# Patient Record
Sex: Male | Born: 1965
Health system: Southern US, Community
[De-identification: ages and names within clinical notes are randomized; demographics above are authoritative.]

## PROBLEM LIST (undated history)

## (undated) DIAGNOSIS — E669 Obesity, unspecified: Secondary | ICD-10-CM

## (undated) DIAGNOSIS — I10 Essential (primary) hypertension: Secondary | ICD-10-CM

## (undated) DIAGNOSIS — G4730 Sleep apnea, unspecified: Secondary | ICD-10-CM

## (undated) DIAGNOSIS — G4733 Obstructive sleep apnea (adult) (pediatric): Secondary | ICD-10-CM

## (undated) DIAGNOSIS — E118 Type 2 diabetes mellitus with unspecified complications: Secondary | ICD-10-CM

## (undated) DIAGNOSIS — F32A Depression, unspecified: Secondary | ICD-10-CM

## (undated) DIAGNOSIS — Z9989 Dependence on other enabling machines and devices: Secondary | ICD-10-CM

## (undated) HISTORY — DX: Obstructive sleep apnea (adult) (pediatric): G47.33

## (undated) HISTORY — DX: Type 2 diabetes mellitus with unspecified complications: E11.8

## (undated) HISTORY — PX: WISDOM TOOTH EXTRACTION: SHX21

## (undated) HISTORY — DX: Essential (primary) hypertension: I10

## (undated) HISTORY — DX: Obesity, unspecified: E66.9

## (undated) HISTORY — DX: Sleep apnea, unspecified: G47.30

## (undated) HISTORY — DX: Depression, unspecified: F32.A

## (undated) HISTORY — DX: Dependence on other enabling machines and devices: Z99.89

---

## 2012-09-15 ENCOUNTER — Ambulatory Visit (INDEPENDENT_AMBULATORY_CARE_PROVIDER_SITE_OTHER): Payer: BC Managed Care – PPO | Admitting: Internal Medicine

## 2012-09-15 ENCOUNTER — Other Ambulatory Visit (INDEPENDENT_AMBULATORY_CARE_PROVIDER_SITE_OTHER): Payer: PRIVATE HEALTH INSURANCE

## 2012-09-15 ENCOUNTER — Encounter: Payer: Self-pay | Admitting: Internal Medicine

## 2012-09-15 VITALS — BP 126/70 | HR 91 | Temp 98.3°F | Resp 16 | Ht 72.0 in | Wt 362.0 lb

## 2012-09-15 DIAGNOSIS — I10 Essential (primary) hypertension: Secondary | ICD-10-CM

## 2012-09-15 DIAGNOSIS — Z23 Encounter for immunization: Secondary | ICD-10-CM

## 2012-09-15 DIAGNOSIS — R7309 Other abnormal glucose: Secondary | ICD-10-CM

## 2012-09-15 DIAGNOSIS — Z800 Family history of malignant neoplasm of digestive organs: Secondary | ICD-10-CM | POA: Insufficient documentation

## 2012-09-15 HISTORY — DX: Morbid (severe) obesity due to excess calories: E66.01

## 2012-09-15 LAB — CBC WITH DIFFERENTIAL/PLATELET
Basophils Absolute: 0.1 10*3/uL (ref 0.0–0.1)
Basophils Relative: 1.3 % (ref 0.0–3.0)
Eosinophils Absolute: 0.2 10*3/uL (ref 0.0–0.7)
Eosinophils Relative: 2.8 % (ref 0.0–5.0)
HCT: 43.8 % (ref 39.0–52.0)
Hemoglobin: 15 g/dL (ref 13.0–17.0)
Lymphocytes Relative: 15 % (ref 12.0–46.0)
Lymphs Abs: 1 10*3/uL (ref 0.7–4.0)
MCHC: 34.2 g/dL (ref 30.0–36.0)
MCV: 94.3 fl (ref 78.0–100.0)
Monocytes Absolute: 0.7 10*3/uL (ref 0.1–1.0)
Monocytes Relative: 10.3 % (ref 3.0–12.0)
Neutro Abs: 4.5 10*3/uL (ref 1.4–7.7)
Neutrophils Relative %: 70.6 % (ref 43.0–77.0)
Platelets: 192 10*3/uL (ref 150.0–400.0)
RBC: 4.65 Mil/uL (ref 4.22–5.81)
RDW: 12.9 % (ref 11.5–14.6)
WBC: 6.3 10*3/uL (ref 4.5–10.5)

## 2012-09-15 LAB — URINALYSIS, ROUTINE W REFLEX MICROSCOPIC
Bilirubin Urine: NEGATIVE
Hgb urine dipstick: NEGATIVE
Ketones, ur: NEGATIVE
Leukocytes, UA: NEGATIVE
Nitrite: NEGATIVE
Specific Gravity, Urine: 1.015 (ref 1.000–1.030)
Total Protein, Urine: NEGATIVE
Urine Glucose: NEGATIVE
Urobilinogen, UA: 2 (ref 0.0–1.0)
pH: 8 (ref 5.0–8.0)

## 2012-09-15 LAB — COMPREHENSIVE METABOLIC PANEL
ALT: 35 U/L (ref 0–53)
AST: 32 U/L (ref 0–37)
Albumin: 4 g/dL (ref 3.5–5.2)
Alkaline Phosphatase: 75 U/L (ref 39–117)
BUN: 14 mg/dL (ref 6–23)
CO2: 33 mEq/L — ABNORMAL HIGH (ref 19–32)
Calcium: 9.6 mg/dL (ref 8.4–10.5)
Chloride: 98 mEq/L (ref 96–112)
Creatinine, Ser: 0.8 mg/dL (ref 0.4–1.5)
GFR: 115.96 mL/min (ref >60.00)
Glucose, Bld: 154 mg/dL — ABNORMAL HIGH (ref 70–99)
Potassium: 4.2 mEq/L (ref 3.5–5.1)
Sodium: 138 mEq/L (ref 135–145)
Total Bilirubin: 0.9 mg/dL (ref 0.3–1.2)
Total Protein: 7.5 g/dL (ref 6.0–8.3)

## 2012-09-15 LAB — LIPID PANEL
Cholesterol: 172 mg/dL (ref 0–200)
HDL: 52.9 mg/dL (ref >39.00)
LDL Cholesterol: 102 mg/dL — ABNORMAL HIGH (ref 0–99)
Total CHOL/HDL Ratio: 3
Triglycerides: 86 mg/dL (ref 0.0–149.0)
VLDL: 17.2 mg/dL (ref 0.0–40.0)

## 2012-09-15 LAB — HEMOGLOBIN A1C: Hgb A1c MFr Bld: 6 % (ref 4.6–6.5)

## 2012-09-15 LAB — TSH: TSH: 4.75 u[IU]/mL (ref 0.35–5.50)

## 2012-09-15 MED ORDER — LISINOPRIL 10 MG PO TABS: 10.0000 mg | ORAL_TABLET | Freq: Every day | ORAL | Status: DC

## 2012-09-15 MED ORDER — TRIAMTERENE-HCTZ 37.5-25 MG PO TABS: 1.0000 | ORAL_TABLET | Freq: Every day | ORAL | Status: DC

## 2012-09-15 NOTE — Assessment & Plan Note (Signed)
I will check his A1C to see if he has developed DM II 

## 2012-09-15 NOTE — Patient Instructions (Signed)

## 2012-09-15 NOTE — Progress Notes (Signed)
  Subjective:    Patient ID: Zachary Murray, male    DOB: 03/12/1966, 46 y.o.   MRN: [id number]  Hypertension This is a chronic problem. The current episode started more than 1 year ago. The problem has been gradually improving since onset. The problem is controlled. Pertinent negatives include no anxiety, blurred vision, chest pain, headaches, malaise/fatigue, neck pain, orthopnea, palpitations, peripheral edema, PND, shortness of breath or sweats. There are no associated agents to hypertension. Past treatments include ACE inhibitors and diuretics. The current treatment provides significant improvement. There are no compliance problems.  Identifiable causes of hypertension include sleep apnea.      Review of Systems  Constitutional: Negative for fever, chills, malaise/fatigue, diaphoresis, activity change, appetite change, fatigue and unexpected weight change.  HENT: Negative.  Negative for neck pain.   Eyes: Negative.  Negative for blurred vision.  Respiratory: Positive for apnea. Negative for cough, choking, chest tightness, shortness of breath, wheezing and stridor.   Cardiovascular: Negative for chest pain, palpitations, orthopnea, leg swelling and PND.  Gastrointestinal: Negative for nausea, vomiting, abdominal pain, diarrhea, constipation, blood in stool and abdominal distention.  Genitourinary: Negative.   Musculoskeletal: Negative.   Skin: Negative.  Negative for color change, pallor, rash and wound.  Neurological: Negative.  Negative for headaches.  Hematological: Negative for adenopathy. Does not bruise/bleed easily.  Psychiatric/Behavioral: Negative.        Objective:   Physical Exam  Vitals reviewed. Constitutional: He is oriented to person, place, and time. He appears well-developed and well-nourished. No distress.  HENT:  Head: Normocephalic and atraumatic.  Mouth/Throat: Oropharynx is clear and moist. No oropharyngeal exudate.  Eyes: Conjunctivae normal are normal. Right  eye exhibits no discharge. Left eye exhibits no discharge. No scleral icterus.  Neck: Normal range of motion. Neck supple. No JVD present. No tracheal deviation present. No thyromegaly present.  Cardiovascular: Normal rate, regular rhythm, normal heart sounds and intact distal pulses.  Exam reveals no gallop and no friction rub.   No murmur heard. Pulmonary/Chest: Effort normal and breath sounds normal. No stridor. No respiratory distress. He has no wheezes. He has no rales. He exhibits no tenderness.  Abdominal: Soft. Bowel sounds are normal. He exhibits no distension and no mass. There is no tenderness. There is no rebound and no guarding.  Musculoskeletal: Normal range of motion. He exhibits no edema and no tenderness.  Lymphadenopathy:    He has no cervical adenopathy.  Neurological: He is oriented to person, place, and time.  Skin: Skin is warm and dry. No rash noted. He is not diaphoretic. No erythema. No pallor.  Psychiatric: He has a normal mood and affect. His behavior is normal. Judgment and thought content normal.     No results found for this basename: WBC, HGB, HCT, PLT, GLUCOSE, CHOL, TRIG, HDL, LDLDIRECT, LDLCALC, ALT, AST, NA, K, CL, CREATININE, BUN, CO2, TSH, PSA, INR, GLUF, HGBA1C, MICROALBUR       Assessment & Plan:

## 2012-09-15 NOTE — Assessment & Plan Note (Signed)
His BP is well controlled, I will check his lytes and renal function 

## 2012-09-15 NOTE — Assessment & Plan Note (Signed)
Gi referral - he needs a screening colonoscopy

## 2013-01-02 ENCOUNTER — Other Ambulatory Visit: Payer: Self-pay

## 2013-04-14 ENCOUNTER — Other Ambulatory Visit: Payer: Self-pay | Admitting: Internal Medicine

## 2013-08-12 ENCOUNTER — Emergency Department (HOSPITAL_COMMUNITY)
Admission: EM | Admit: 2013-08-12 | Discharge: 2013-08-13 | Disposition: A | Discharge status: Home / Self Care | Payer: BC Managed Care – PPO | Attending: Emergency Medicine | Admitting: Emergency Medicine

## 2013-08-12 ENCOUNTER — Encounter (HOSPITAL_COMMUNITY): Payer: Self-pay | Admitting: Nurse Practitioner

## 2013-08-12 DIAGNOSIS — F1010 Alcohol abuse, uncomplicated: Secondary | ICD-10-CM | POA: Insufficient documentation

## 2013-08-12 DIAGNOSIS — F3289 Other specified depressive episodes: Secondary | ICD-10-CM | POA: Insufficient documentation

## 2013-08-12 DIAGNOSIS — F411 Generalized anxiety disorder: Secondary | ICD-10-CM | POA: Insufficient documentation

## 2013-08-12 DIAGNOSIS — F329 Major depressive disorder, single episode, unspecified: Secondary | ICD-10-CM | POA: Insufficient documentation

## 2013-08-12 DIAGNOSIS — I10 Essential (primary) hypertension: Secondary | ICD-10-CM | POA: Insufficient documentation

## 2013-08-12 DIAGNOSIS — Z79899 Other long term (current) drug therapy: Secondary | ICD-10-CM | POA: Insufficient documentation

## 2013-08-12 DIAGNOSIS — G4733 Obstructive sleep apnea (adult) (pediatric): Secondary | ICD-10-CM | POA: Insufficient documentation

## 2013-08-12 LAB — CBC
HCT: 40.5 % (ref 39.0–52.0)
Hemoglobin: 14.2 g/dL (ref 13.0–17.0)
MCH: 31.6 pg (ref 26.0–34.0)
MCHC: 35.1 g/dL (ref 30.0–36.0)
MCV: 90.2 fL (ref 78.0–100.0)
Platelets: 208 10*3/uL (ref 150–400)
RBC: 4.49 MIL/uL (ref 4.22–5.81)
RDW: 12.9 % (ref 11.5–15.5)
WBC: 5.5 10*3/uL (ref 4.0–10.5)

## 2013-08-12 LAB — COMPREHENSIVE METABOLIC PANEL
ALT: 24 U/L (ref 0–53)
AST: 27 U/L (ref 0–37)
Albumin: 4.1 g/dL (ref 3.5–5.2)
Alkaline Phosphatase: 80 U/L (ref 39–117)
BUN: 11 mg/dL (ref 6–23)
CO2: 26 mEq/L (ref 19–32)
Calcium: 8.9 mg/dL (ref 8.4–10.5)
Chloride: 91 mEq/L — ABNORMAL LOW (ref 96–112)
Creatinine, Ser: 0.61 mg/dL (ref 0.50–1.35)
GFR calc Af Amer: >90 mL/min (ref >90)
GFR calc non Af Amer: >90 mL/min (ref >90)
Glucose, Bld: 151 mg/dL — ABNORMAL HIGH (ref 70–99)
Potassium: 3.3 mEq/L — ABNORMAL LOW (ref 3.5–5.1)
Sodium: 135 mEq/L (ref 135–145)
Total Bilirubin: 0.5 mg/dL (ref 0.3–1.2)
Total Protein: 7.6 g/dL (ref 6.0–8.3)

## 2013-08-12 LAB — SALICYLATE LEVEL: Salicylate Lvl: <2 mg/dL — ABNORMAL LOW (ref 2.8–20.0)

## 2013-08-12 LAB — RAPID URINE DRUG SCREEN, HOSP PERFORMED
Amphetamines: NOT DETECTED
Barbiturates: NOT DETECTED
Benzodiazepines: NOT DETECTED
Cocaine: NOT DETECTED
Opiates: NOT DETECTED
Tetrahydrocannabinol: NOT DETECTED

## 2013-08-12 LAB — ACETAMINOPHEN LEVEL: Acetaminophen (Tylenol), Serum: <15 ug/mL (ref 10–30)

## 2013-08-12 LAB — ETHANOL: Alcohol, Ethyl (B): 74 mg/dL — ABNORMAL HIGH (ref 0–11)

## 2013-08-12 MED ORDER — ADULT MULTIVITAMIN W/MINERALS CH
1.0000 | ORAL_TABLET | Freq: Every day | ORAL | Status: DC
  Administered 2013-08-12: 1 via ORAL
  Filled 2013-08-12: qty 1

## 2013-08-12 MED ORDER — LORAZEPAM 1 MG PO TABS
1.0000 mg | ORAL_TABLET | Freq: Once | ORAL | Status: AC
  Administered 2013-08-12: 1 mg via ORAL
  Filled 2013-08-12: qty 1

## 2013-08-12 MED ORDER — ZOLPIDEM TARTRATE 5 MG PO TABS: 5.0000 mg | ORAL_TABLET | Freq: Every evening | ORAL | Status: DC | PRN

## 2013-08-12 MED ORDER — POTASSIUM CHLORIDE CRYS ER 20 MEQ PO TBCR
40.0000 meq | EXTENDED_RELEASE_TABLET | Freq: Once | ORAL | Status: AC
  Administered 2013-08-12: 40 meq via ORAL
  Filled 2013-08-12: qty 2

## 2013-08-12 MED ORDER — THIAMINE HCL 100 MG/ML IJ SOLN: 100.0000 mg | Freq: Every day | INTRAMUSCULAR | Status: DC

## 2013-08-12 MED ORDER — NICOTINE 21 MG/24HR TD PT24
21.0000 mg | MEDICATED_PATCH | Freq: Every day | TRANSDERMAL | Status: DC
  Administered 2013-08-12: 21 mg via TRANSDERMAL
  Filled 2013-08-12: qty 1

## 2013-08-12 MED ORDER — ONDANSETRON HCL 4 MG PO TABS: 4.0000 mg | ORAL_TABLET | Freq: Three times a day (TID) | ORAL | Status: DC | PRN

## 2013-08-12 MED ORDER — VITAMIN B-1 100 MG PO TABS
100.0000 mg | ORAL_TABLET | Freq: Every day | ORAL | Status: DC
  Administered 2013-08-12: 100 mg via ORAL
  Filled 2013-08-12 (×2): qty 1

## 2013-08-12 MED ORDER — FOLIC ACID 1 MG PO TABS
1.0000 mg | ORAL_TABLET | Freq: Every day | ORAL | Status: DC
  Administered 2013-08-12: 1 mg via ORAL
  Filled 2013-08-12: qty 1

## 2013-08-12 MED ORDER — LORAZEPAM 2 MG/ML IJ SOLN: 1.0000 mg | Freq: Four times a day (QID) | INTRAMUSCULAR | Status: DC | PRN

## 2013-08-12 MED ORDER — ALUM & MAG HYDROXIDE-SIMETH 200-200-20 MG/5ML PO SUSP: 30.0000 mL | ORAL | Status: DC | PRN

## 2013-08-12 MED ORDER — LORAZEPAM 1 MG PO TABS
1.0000 mg | ORAL_TABLET | Freq: Four times a day (QID) | ORAL | Status: DC | PRN
  Administered 2013-08-12: 1 mg via ORAL
  Filled 2013-08-12: qty 1

## 2013-08-12 MED ORDER — IBUPROFEN 400 MG PO TABS: 600.0000 mg | ORAL_TABLET | Freq: Three times a day (TID) | ORAL | Status: DC | PRN

## 2013-08-12 NOTE — ED Notes (Signed)
Pt reports he has been a "heavy drinker" all of his life. States his drinking "has gotten out of control and i am ready to quit drinking." pt last drink was 1200 today. Denies other substance abuse. Denies SI or HI. Pt c/o body aches and feeling shaky now. Pt is tearful and shaky in triage.

## 2013-08-12 NOTE — ED Notes (Signed)
Pt gave the ok for nurse to speak to his father Jillyn Hidden) over the phone and provide an update on his condition.

## 2013-08-12 NOTE — ED Notes (Signed)
telepsych computer placed in room

## 2013-08-12 NOTE — BH Assessment (Signed)
Tele Assessment Note   Zachary Murray is a 46 y.o. single white male.  He presents unaccompanied at [facility] due to physical distress related to alcohol withdrawal, stating, "I think the way I felt physically is what triggered me to come in."  Stressors: Pt reports that he has been unemployed since 2007, and that he currently lives with his parents, to whose home he may return after discharge.  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  He and his father have considered starting a business of their own, but have not developed the idea into a plan.  Pt denies any financial problems, any problems with social supports, or any current legal problems, although he does have a history of a DUI about 21 years ago.  Lethality: Suicidality:  Pt denies SI currently or at any time in the past.  He denies any history of suicide attempts, or of self mutilation.  Pt endorses depressed mood with symptoms noted in the "risk to self" assessment below. Homicidality: Pt denies homicidal thoughts or physical aggression.  Pt does acknowledge having access to firearms, kept in a safe in the home to which he has a key.  Pt denies having any legal problems at this time.  Pt is calm and cooperative during assessment. Psychosis: Pt denies hallucinations.  Pt does not appear to be responding to internal stimuli and exhibits no delusional thought.  Pt's reality testing appears to be intact. Substance Abuse: Pt reports heavy use of alcohol, drinking a total of about 27 liters of wine between Friday, 08/06/2013 and 12:00 today, 08/12/2013.  Pt reports intermittent heavy use, but then clarifies that he still drinks on days when he is not binging, usually about 1 liter of wine daily.  His longest period of sobriety was about 6 weeks in 2008.  He denies having problems with any other substance besides chewing tobacco.  Pt denies any history of seizures or DT's in withdrawal, but reports many current withdrawal symptoms as  found in the "Additional Social History" section below, with a CIWA score totaling 18.  Social Supports: Pt identifies his parents, with whom he lives, as his main supports.  It is noteworthy that he identifies both of them as daily, but functional, drinkers.  Treatment History: Pt has never been hospitalized for mental health or substance abuse problems.  He has never been in any kind of outpatient treatment or programming.  He has never participated in [employer], saying, "That's not for me."  He is not interested in any of these options today, and only presents in the ED requesting medication to mitigate his current withdrawal symptoms.   Axis I: Alcohol Dependence 303.90; Mood Disorder NOS 296.90 Axis II: Deferred Axis III:  Past Medical History  Diagnosis Date  . Hypertension   . OSA on CPAP    Axis IV: occupational problems Axis V: GAF = 50  Past Medical History:  Past Medical History  Diagnosis Date  . Hypertension   . OSA on CPAP     History reviewed. No pertinent past surgical history.  Family History:  Family History  Problem Relation Age of Onset  . Colon cancer Father   . Hypertension Father   . Alcohol abuse Neg Hx   . COPD Neg Hx   . Diabetes Neg Hx   . Early death Neg Hx   . Hearing loss Neg Hx   . Heart disease Neg Hx   . Hyperlipidemia Neg Hx   .  Kidney disease Neg Hx   . Stroke Neg Hx     Social History:  reports that he has never smoked. His smokeless tobacco use includes Chew. He reports that he drinks about 6.0 ounces of alcohol per week. He reports that he does not use illicit drugs.  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 Withdrawal Symptoms: Cramps;Tremors;Agitation;Diarrhea;Weakness (Denies history of seizures or DT's) Substance #1 Name of  Substance 1: Alcohol 1 - Age of First Use: 47 y/o 1 - Amount (size/oz): 4 liters of wine 1 - Frequency: daily 1 - Duration: Past 7 days 1 - Last Use / Amount: 12:00 today (08/12/2013)  CIWA: CIWA-Ar BP: 148/87 mmHg Pulse Rate: 98 Nausea and Vomiting: no nausea and no vomiting Tactile Disturbances: none Tremor: five Auditory Disturbances: not present Paroxysmal Sweats: barely perceptible sweating, palms moist Visual Disturbances: not present Anxiety: five Headache, Fullness in Head: very mild Agitation: five Orientation and Clouding of Sensorium: cannot do serial additions or is uncertain about date CIWA-Ar Total: 18 COWS:    Allergies: No Known Allergies  Home Medications:  (Not in a hospital admission)  OB/GYN Status:  No LMP for male patient.  General Assessment Data Location of Assessment: [facility] ED Is this a Tele or Face-to-Face Assessment?: Tele Assessment Is this an Initial Assessment or a Re-assessment for this encounter?: Initial Assessment Living Arrangements: Parent (Both parents) Can pt return to current living arrangement?: Yes Admission Status: Voluntary Is patient capable of signing voluntary admission?: Yes Transfer from: Acute Hospital Referral Source: Other [facility] ED)  Medical Screening Exam [facility] Walk-in ONLY) Medical Exam completed: No Reason for MSE not completed: Other: (Medically cleared at [facility])  [facility] Crisis Care Plan Living Arrangements: Parent (Both parents) Name of Psychiatrist: None Name of Therapist: None  Education Status Is patient currently in school?: No  Risk to self Suicidal Ideation: No Suicidal Intent: No Is patient at risk for suicide?: No Suicidal Plan?: No Access to Means: No What has been your use of drugs/alcohol within the last 12 months?: Frequent use of alcohol Previous Attempts/Gestures: No How many times?: 0 Other Self Harm Risks: Denies any history of SI: "I'm not that selfish." Triggers for Past  Attempts: Other (Comment) (Not applicable) Intentional Self Injurious Behavior: None Family Suicide History: No Recent stressful life event(s): Job Loss;Other (Comment) (Stopped drinking today & feels bad; unemployed since 2007) Persecutory voices/beliefs?: No Depression: Yes Depression Symptoms: Tearfulness;Isolating;Loss of interest in usual pleasures;Feeling worthless/self pity (Hopelessness) Substance abuse history and/or treatment for substance abuse?: Yes (Frequent use of alcohol) Suicide prevention information given to non-admitted patients: Not applicable (Tele-assessment, unable to provide)  Risk to Others Homicidal Ideation: No Thoughts of Harm to Others: No Current Homicidal Intent: No Current Homicidal Plan: No Access to Homicidal Means: No Identified Victim: None History of harm to others?: No Assessment of Violence: None Noted Violent Behavior Description: Calm, cooperative Does patient have access to weapons?: Yes (Comment) (Guns in safe, pt has key.) Criminal Charges Pending?: No Does patient have a court date: No  Psychosis Hallucinations: None noted Delusions: None noted  Mental Status Report Appear/Hygiene: Other (Comment) [facility] gown) Eye Contact: Good Motor Activity: Restlessness (Mildly restless) Speech: Other (Comment) (Unremarkable) Level of Consciousness: Alert Mood: Anxious;Apathetic Affect: Other (Comment) (Constricted) Anxiety Level: Moderate Thought Processes: Coherent;Relevant Judgement: Impaired Orientation: Person;Place;Time;Situation Obsessive Compulsive Thoughts/Behaviors:  None  Cognitive Functioning Concentration: Normal Memory: Recent Intact;Remote Intact IQ: Average Insight: Fair Impulse Control: Good Appetite: Good Weight Loss: 0 Weight Gain: 0 Sleep: No Change Total Hours of Sleep: 8 (Uses CPAP intermittently) Vegetative Symptoms: Staying in bed;Not bathing;Decreased grooming  ADLScreening [facility] Assessment  Services) Patient's cognitive ability adequate to safely complete daily activities?: Yes Patient able to express need for assistance with ADLs?: Yes Independently performs ADLs?: Yes (appropriate for developmental age)  Prior Inpatient Therapy Prior Inpatient Therapy: No Prior Therapy Dates: None  Prior Outpatient Therapy Prior Outpatient Therapy: No Prior Therapy Dates: None Prior Therapy Facilty/Provider(s): Pt also denies any history of 12-Step meetings.  ADL Screening (condition at time of admission) Patient's cognitive ability adequate to safely complete daily activities?: Yes Is the patient deaf or have difficulty hearing?: No Does the patient have difficulty seeing, even when wearing glasses/contacts?: No Does the patient have difficulty concentrating, remembering, or making decisions?: No Patient able to express need for assistance with ADLs?: Yes Does the patient have difficulty dressing or bathing?: No Independently performs ADLs?: Yes (appropriate for developmental age) Does the patient have difficulty walking or climbing stairs?: No Weakness of Legs: None Weakness of Arms/Hands: None  Home Assistive Devices/Equipment Home Assistive Devices/Equipment: CPAP    Abuse/Neglect Assessment (Assessment to be complete while patient is alone) Physical Abuse: Denies Verbal Abuse: Denies Sexual Abuse: Denies Exploitation of patient/patient's resources: Denies Self-Neglect: Denies Values / Beliefs Cultural Requests During Hospitalization: None Spiritual Requests During Hospitalization: None   Advance Directives (For Healthcare) Advance Directive: Patient does not have advance directive (Tele-assessment: unable to provide) Nutrition Screen- [facility] Adult/WL/AP Patient's home diet: Regular  Additional Information 1:1 In Past 12 Months?: No CIRT Risk: No Elopement Risk: No Does patient have medical clearance?: Yes     Disposition:  Disposition Initial Assessment  Completed for this Encounter: Yes Disposition of Patient: Treatment offered and refused (Pt only wants to be discharged with medications.) Type of treatment offered and refused: Other (Comment) (Pt only wants to be discharged with medications.) After reviewing pt with Janann August, NP @ 20:36, it has been determined that pt does not present a danger to himself or others, but that he would benefit from detoxification from alcohol.  However, pt presents at the ED requesting medication to mitigate withdrawal symptoms, does not want admission for detox and is not interested in information about treatment programs.  At 20:37 I spoke to EDP Vanetta Mulders, MD, who concurs with these findings.  Doylene Canning, MA Triage Specialist Raphael Gibney 08/12/2013 9:08 PM

## 2013-08-12 NOTE — ED Provider Notes (Signed)
CSN: [id number]     Arrival date & time 08/12/13  1802 History   First MD Initiated Contact with Patient 08/12/13 1813     Chief Complaint  Patient presents with  . Alcohol Problem   (Consider location/radiation/quality/duration/timing/severity/associated sxs/prior Treatment) HPI Comments: 47 year old morbidly obese male the past medical history of hypertension and sleep apnea presents to the emergency department requesting help for an alcohol problem. Patient states he has been "heavy drinker" for a while, occasionally goes on severe binges. States over the past 3-4 days he has been drinking about 3 L of wine daily. Last alcohol intake was at 12:00 noon today. He has never sought help for his alcohol problem. States he is overwhelmed with life in general and slightly depressed. He lives at home with his parents. Denies suicidal or homicidal ideations. Denies any other drug abuse. States he is otherwise feeling fine and does not have any other complaints at this time.  Patient is a 47 y.o. male presenting with alcohol problem. The history is provided by the patient.  Alcohol Problem Pertinent negatives include no abdominal pain, chest pain, nausea or vomiting.    Past Medical History  Diagnosis Date  . Hypertension   . OSA on CPAP    History reviewed. No pertinent past surgical history. Family History  Problem Relation Age of Onset  . Colon cancer Father   . Hypertension Father   . Alcohol abuse Neg Hx   . COPD Neg Hx   . Diabetes Neg Hx   . Early death Neg Hx   . Hearing loss Neg Hx   . Heart disease Neg Hx   . Hyperlipidemia Neg Hx   . Kidney disease Neg Hx   . Stroke Neg Hx    History  Substance Use Topics  . Smoking status: Never Smoker   . Smokeless tobacco: Current User    Types: Chew  . Alcohol Use: 6.0 oz/week    10 Glasses of wine per week    Review of Systems  Respiratory: Negative for shortness of breath.   Cardiovascular: Negative for chest pain.   Gastrointestinal: Negative for nausea, vomiting and abdominal pain.  Psychiatric/Behavioral: Positive for dysphoric mood. Negative for suicidal ideas. The patient is nervous/anxious.        Positive for alcohol abuse.  All other systems reviewed and are negative.    Allergies  Review of patient's allergies indicates no known allergies.  Home Medications   Current Outpatient Rx  Name  Route  Sig  Dispense  Refill  . lisinopril (PRINIVIL,ZESTRIL) 10 MG tablet   Oral   Take 1 tablet (10 mg total) by mouth daily.   90 tablet   1   . triamterene-hydrochlorothiazide (MAXZIDE-25) 37.5-25 MG per tablet   Oral   Take 1 each (1 tablet total) by mouth daily.   90 tablet   1    BP 171/94  Pulse 114  Temp(Src) 98.4 F (36.9 C) (Oral)  Resp 18  Ht 6' (1.829 m)  Wt 390 lb (176.903 kg)  BMI 52.88 kg/m2  SpO2 93% Physical Exam  Nursing note and vitals reviewed. Constitutional: He is oriented to person, place, and time. He appears well-developed and well-nourished. No distress.  Morbidly obese  HENT:  Head: Normocephalic and atraumatic.  Mouth/Throat: Oropharynx is clear and moist.  Eyes: Conjunctivae and EOM are normal. Pupils are equal, round, and reactive to light.  Neck: Normal range of motion. Neck supple.  Cardiovascular: Normal rate, regular rhythm and normal  heart sounds.   Pulmonary/Chest: Effort normal and breath sounds normal.  Musculoskeletal: Normal range of motion. He exhibits no edema.  Neurological: He is alert and oriented to person, place, and time.  Skin: Skin is warm and dry. He is not diaphoretic.  Psychiatric: His speech is normal and behavior is normal. Thought content normal. His mood appears anxious. He exhibits a depressed mood. He expresses no homicidal and no suicidal ideation.  Tearful.    ED Course  Procedures (including critical care time) Labs Review Labs Reviewed  COMPREHENSIVE METABOLIC PANEL - Abnormal; Notable for the following:     Potassium 3.3 (*)    Chloride 91 (*)    Glucose, Bld 151 (*)    All other components within normal limits  ETHANOL - Abnormal; Notable for the following:    Alcohol, Ethyl (B) 74 (*)    All other components within normal limits  SALICYLATE LEVEL - Abnormal; Notable for the following:    Salicylate Lvl <2.0 (*)    All other components within normal limits  ACETAMINOPHEN LEVEL  CBC  URINE RAPID DRUG SCREEN (HOSP PERFORMED)   Imaging Review No results found.  MDM   1. Alcohol abuse      Patient with depression, alcohol abuse. No SI/HI. Medically cleared for TTS assessment. He is very anxious, tearful.  12:11 AM During TTS assessment, patient states he does not want inpatient help with withdrawal, does not seem harm to himself or others and feel he is safe to go home. He will be discharged with ativan and zofran.  Trevor Mace, PA-C 08/13/13 0013

## 2013-08-12 NOTE — ED Notes (Signed)
CIWA score: 18

## 2013-08-13 MED ORDER — LORAZEPAM 1 MG PO TABS: 1.0000 mg | ORAL_TABLET | Freq: Three times a day (TID) | ORAL | Status: DC | PRN

## 2013-08-13 MED ORDER — ONDANSETRON HCL 4 MG PO TABS: 4.0000 mg | ORAL_TABLET | Freq: Four times a day (QID) | ORAL | Status: DC

## 2013-08-13 NOTE — ED Provider Notes (Signed)
Medical screening examination/treatment/procedure(s) were performed by non-physician practitioner and as supervising physician I was immediately available for consultation/collaboration.   Junius Argyle, MD 08/13/13 [phone]

## 2013-11-09 ENCOUNTER — Ambulatory Visit: Payer: BC Managed Care – PPO

## 2013-11-19 ENCOUNTER — Ambulatory Visit (INDEPENDENT_AMBULATORY_CARE_PROVIDER_SITE_OTHER): Payer: BC Managed Care – PPO | Admitting: Internal Medicine

## 2013-11-19 ENCOUNTER — Other Ambulatory Visit: Payer: Self-pay | Admitting: *Deleted

## 2013-11-19 ENCOUNTER — Encounter: Payer: Self-pay | Admitting: Internal Medicine

## 2013-11-19 ENCOUNTER — Other Ambulatory Visit (INDEPENDENT_AMBULATORY_CARE_PROVIDER_SITE_OTHER): Payer: BC Managed Care – PPO

## 2013-11-19 VITALS — BP 130/84 | HR 80 | Temp 97.9°F | Resp 16 | Wt 362.0 lb

## 2013-11-19 DIAGNOSIS — E785 Hyperlipidemia, unspecified: Secondary | ICD-10-CM

## 2013-11-19 DIAGNOSIS — Z9989 Dependence on other enabling machines and devices: Secondary | ICD-10-CM

## 2013-11-19 DIAGNOSIS — E876 Hypokalemia: Secondary | ICD-10-CM

## 2013-11-19 DIAGNOSIS — E1169 Type 2 diabetes mellitus with other specified complication: Secondary | ICD-10-CM | POA: Insufficient documentation

## 2013-11-19 DIAGNOSIS — Z800 Family history of malignant neoplasm of digestive organs: Secondary | ICD-10-CM

## 2013-11-19 DIAGNOSIS — I10 Essential (primary) hypertension: Secondary | ICD-10-CM

## 2013-11-19 DIAGNOSIS — R7309 Other abnormal glucose: Secondary | ICD-10-CM

## 2013-11-19 DIAGNOSIS — Z23 Encounter for immunization: Secondary | ICD-10-CM

## 2013-11-19 DIAGNOSIS — G4733 Obstructive sleep apnea (adult) (pediatric): Secondary | ICD-10-CM | POA: Insufficient documentation

## 2013-11-19 HISTORY — DX: Type 2 diabetes mellitus with other specified complication: E11.69

## 2013-11-19 LAB — BASIC METABOLIC PANEL
BUN: 18 mg/dL (ref 6–23)
CO2: 31 mEq/L (ref 19–32)
Calcium: 9.9 mg/dL (ref 8.4–10.5)
Chloride: 101 mEq/L (ref 96–112)
Creatinine, Ser: 0.8 mg/dL (ref 0.4–1.5)
GFR: 108.77 mL/min (ref >60.00)
Glucose, Bld: 119 mg/dL — ABNORMAL HIGH (ref 70–99)
Potassium: 4.2 mEq/L (ref 3.5–5.1)
Sodium: 140 mEq/L (ref 135–145)

## 2013-11-19 LAB — LIPID PANEL
Cholesterol: 145 mg/dL (ref 0–200)
HDL: 49.2 mg/dL (ref >39.00)
LDL Cholesterol: 87 mg/dL (ref 0–99)
Total CHOL/HDL Ratio: 3
Triglycerides: 45 mg/dL (ref 0.0–149.0)
VLDL: 9 mg/dL (ref 0.0–40.0)

## 2013-11-19 LAB — TSH: TSH: 3.13 u[IU]/mL (ref 0.35–5.50)

## 2013-11-19 LAB — MAGNESIUM: Magnesium: 1.5 mg/dL (ref 1.5–2.5)

## 2013-11-19 LAB — HEMOGLOBIN A1C: Hgb A1c MFr Bld: 5.9 % (ref 4.6–6.5)

## 2013-11-19 MED ORDER — TRIAMTERENE-HCTZ 37.5-25 MG PO TABS: 1.0000 | ORAL_TABLET | Freq: Every day | ORAL | Status: DC

## 2013-11-19 MED ORDER — LISINOPRIL 10 MG PO TABS: 10.0000 mg | ORAL_TABLET | Freq: Every day | ORAL | Status: DC

## 2013-11-19 NOTE — Assessment & Plan Note (Signed)
He has lost 30 pounds with diet and exercise

## 2013-11-19 NOTE — Assessment & Plan Note (Signed)
Again, I have asked him to see GI for a colonoscopy

## 2013-11-19 NOTE — Progress Notes (Signed)
   Subjective:    Patient ID: Zachary Murray, male    DOB: 1966-01-24, 48 y.o.   MRN: [id number]  Hypertension This is a chronic problem. The current episode started more than 1 year ago. The problem has been gradually improving since onset. The problem is controlled. Associated symptoms include malaise/fatigue. Pertinent negatives include no anxiety, blurred vision, chest pain, headaches, neck pain, orthopnea, palpitations, peripheral edema, PND, shortness of breath or sweats. There are no associated agents to hypertension. There are no known risk factors for coronary artery disease. Past treatments include ACE inhibitors and diuretics. There are no compliance problems.  Identifiable causes of hypertension include sleep apnea.      Review of Systems  Constitutional: Positive for malaise/fatigue and fatigue. Negative for fever, chills, diaphoresis, activity change, appetite change and unexpected weight change.  HENT: Negative.   Eyes: Negative.  Negative for blurred vision.  Respiratory: Positive for apnea. Negative for cough, choking, chest tightness, shortness of breath, wheezing and stridor.   Cardiovascular: Negative.  Negative for chest pain, palpitations, orthopnea, leg swelling and PND.  Gastrointestinal: Negative.  Negative for nausea, vomiting, abdominal pain, diarrhea, constipation and blood in stool.  Endocrine: Negative.   Genitourinary: Negative.   Musculoskeletal: Negative.  Negative for neck pain.  Skin: Negative.   Allergic/Immunologic: Negative.   Neurological: Negative.  Negative for dizziness, syncope, speech difficulty, weakness, light-headedness and headaches.  Hematological: Negative.  Negative for adenopathy. Does not bruise/bleed easily.  Psychiatric/Behavioral: Negative.        Objective:   Physical Exam  Vitals reviewed. Constitutional: He is oriented to person, place, and time. He appears well-developed and well-nourished. No distress.  HENT:  Head:  Normocephalic and atraumatic.  Mouth/Throat: Oropharynx is clear and moist. No oropharyngeal exudate.  Eyes: Conjunctivae are normal. Right eye exhibits no discharge. Left eye exhibits no discharge. No scleral icterus.  Neck: Normal range of motion. Neck supple. No JVD present. No tracheal deviation present. No thyromegaly present.  Cardiovascular: Normal rate, regular rhythm, normal heart sounds and intact distal pulses.  Exam reveals no gallop and no friction rub.   No murmur heard. Pulmonary/Chest: Effort normal and breath sounds normal. No stridor. No respiratory distress. He has no wheezes. He has no rales. He exhibits no tenderness.  Abdominal: Soft. Bowel sounds are normal. He exhibits no distension and no mass. There is no tenderness. There is no rebound and no guarding.  Musculoskeletal: Normal range of motion. He exhibits no edema and no tenderness.  Lymphadenopathy:    He has no cervical adenopathy.  Neurological: He is oriented to person, place, and time.  Skin: Skin is warm and dry. No rash noted. He is not diaphoretic. No erythema. No pallor.  Psychiatric: He has a normal mood and affect. His behavior is normal. Judgment normal.    Lab Results  Component Value Date   WBC 5.5 08/12/2013   HGB 14.2 08/12/2013   HCT 40.5 08/12/2013   PLT 208 08/12/2013   GLUCOSE 151* 08/12/2013   CHOL 172 09/15/2012   TRIG 86.0 09/15/2012   HDL 52.90 09/15/2012   LDLCALC 102* 09/15/2012   ALT 24 08/12/2013   AST 27 08/12/2013   NA 135 08/12/2013   K 3.3* 08/12/2013   CL 91* 08/12/2013   CREATININE 0.61 08/12/2013   BUN 11 08/12/2013   CO2 26 08/12/2013   TSH 4.75 09/15/2012   HGBA1C 6.0 09/15/2012        Assessment & Plan:

## 2013-11-19 NOTE — Assessment & Plan Note (Signed)
FLP today 

## 2013-11-19 NOTE — Progress Notes (Signed)
Pre visit review using our clinic review tool, if applicable. No additional management support is needed unless otherwise documented below in the visit note. 

## 2013-11-19 NOTE — Patient Instructions (Signed)

## 2013-11-19 NOTE — Assessment & Plan Note (Signed)
I will recheck his K+ level and will check his Mg++ level

## 2013-11-19 NOTE — Assessment & Plan Note (Signed)
I will check his A1C to see if he has developed DM2 

## 2013-11-19 NOTE — Assessment & Plan Note (Signed)
He has not had this evaluated in 10 years so I have referred him to sleep medicine

## 2013-11-19 NOTE — Assessment & Plan Note (Signed)
His BP is well controlled 

## 2013-12-13 ENCOUNTER — Telehealth: Payer: Self-pay | Admitting: Internal Medicine

## 2013-12-13 NOTE — Telephone Encounter (Signed)
Pt is aware.  

## 2013-12-13 NOTE — Telephone Encounter (Signed)
Pulmonary does our sleep medicine

## 2013-12-13 NOTE — Telephone Encounter (Signed)
Pt has questions about the pulmonary referral.  He states the sleep apnea is neurological and thinks he should be going to a neurologist.  Should he cancel the pulmonary appt.?

## 2013-12-17 ENCOUNTER — Encounter: Payer: Self-pay | Admitting: Pulmonary Disease

## 2013-12-17 ENCOUNTER — Ambulatory Visit (INDEPENDENT_AMBULATORY_CARE_PROVIDER_SITE_OTHER): Payer: BC Managed Care – PPO | Admitting: Pulmonary Disease

## 2013-12-17 VITALS — BP 112/80 | HR 87 | Temp 98.7°F | Ht 72.0 in | Wt 364.4 lb

## 2013-12-17 DIAGNOSIS — G4733 Obstructive sleep apnea (adult) (pediatric): Secondary | ICD-10-CM

## 2013-12-17 DIAGNOSIS — Z9989 Dependence on other enabling machines and devices: Secondary | ICD-10-CM

## 2013-12-17 NOTE — Assessment & Plan Note (Signed)
The patient has a history of obstructive sleep apnea which has responded well to CPAP to date. His CPAP machine is now very aged, and obviously needs to be replaced. He is also having some increased symptoms at night and during the day. We will arrange for a new CPAP device, and will also take this opportunity to optimize his pressure again. I've also encouraged him to work aggressively on weight loss, and I will need to see him on a yearly basis.

## 2013-12-17 NOTE — Patient Instructions (Signed)
Will get you set up with a homecare company, and arrange for new cpap device on auto setting.  Once download received, can leave on auto or set on fixed pressure.  Work on weight loss followup with me in one year if doing well.

## 2013-12-17 NOTE — Progress Notes (Signed)
Subjective:    Patient ID: Zachary Murray, male    DOB: 03-17-1966, 48 y.o.   MRN: [id number]  HPI The patient is a 48 year old male who I've been asked to see for management of obstructive sleep apnea. He was diagnosed in 2005 at [facility] with sleep apnea, and started on CPAP at a pressure of 15 cm of water.  He has been on his device since that time, and feels that it may not be working properly currently. He uses a nasal CPAP mask, and has been trying to keep up with changes on a consistent basis. He does not use a heated humidifier. The patient has noted some breakthrough events manifesting as a snoring arousal, and feels that he is not rested in the mornings upon arising. He notes definite sleepiness and fatigue every afternoon, as well as sleepiness with reading in the evening. He denies any sleepiness with driving. The patient states that his weight has decreased 40 pounds since his initial diagnosis, and his Epworth score today is 6.   Sleep Questionnaire What time do you typically go to bed?( Between what hours) 10p-12a 10p-12a at 1623 on 12/17/13 by Virl Cagey, CMA How long does it take you to fall asleep? 62mins 56mins at 1623 on 12/17/13 by Virl Cagey, CMA How many times during the night do you wake up? 10 10 at 1623 on 12/17/13 by Virl Cagey, CMA What time do you get out of bed to start your day? 0730 0730 at 1623 on 12/17/13 by Virl Cagey, CMA Do you drive or operate heavy machinery in your occupation? No No at 1623 on 12/17/13 by Virl Cagey, CMA How much has your weight changed (up or down) over the past two years? (In pounds) 40 lb (18.144 kg) 40 lb (18.144 kg) at 1623 on 12/17/13 by Virl Cagey, CMA Have you ever had a sleep study before? Yes Yes at 1623 on 12/17/13 by Virl Cagey, CMA If yes, location of study? [place] at 9678 on 12/17/13 by Virl Cagey, CMA If yes, date of study?  2005 2005 at 1623 on 12/17/13 by Virl Cagey, CMA Do you currently use CPAP? Yes Yes at 1623 on 12/17/13 by Virl Cagey, CMA If so, what pressure? 15 15 at 1623 on 12/17/13 by Virl Cagey, CMA Do you wear oxygen at any time? No    Review of Systems  Constitutional: Negative for fever and unexpected weight change.  HENT: Negative for congestion, dental problem, ear pain, nosebleeds, postnasal drip, rhinorrhea, sinus pressure, sneezing, sore throat and trouble swallowing.   Eyes: Negative for redness and itching.  Respiratory: Negative for cough, chest tightness, shortness of breath and wheezing.   Cardiovascular: Negative for palpitations and leg swelling.  Gastrointestinal: Negative for nausea and vomiting.  Genitourinary: Negative for dysuria.  Musculoskeletal: Negative for joint swelling.  Skin: Negative for rash.  Neurological: Positive for headaches.  Hematological: Does not bruise/bleed easily.  Psychiatric/Behavioral: Negative for dysphoric mood. The patient is not nervous/anxious.        Objective:   Physical Exam Constitutional:  Obese male, no acute distress  HENT:  Nares patent without discharge  Oropharynx without exudate, palate and uvula are moderately elongated.   Eyes:  Perrla, eomi, no scleral icterus  Neck:  No JVD, no TMG  Cardiovascular:  Normal rate, regular rhythm, no rubs or gallops.  No murmurs  Intact distal pulses  Pulmonary :  Normal breath sounds, no stridor or respiratory distress   No rales, rhonchi, or wheezing  Abdominal:  Soft, nondistended, bowel sounds present.  No tenderness noted.   Musculoskeletal:  mild lower extremity edema noted.  Lymph Nodes:  No cervical lymphadenopathy noted  Skin:  No cyanosis noted  Neurologic:  Alert, appropriate, moves all 4 extremities without obvious deficit.         Assessment & Plan:

## 2013-12-20 ENCOUNTER — Telehealth: Payer: Self-pay | Admitting: Pulmonary Disease

## 2013-12-20 NOTE — Telephone Encounter (Signed)
Spoke with pt. He reports this is the only study he has had done-the CPAP titration. Looked and no sleep study received yet. Office is now closed. Will call back in AM.

## 2013-12-20 NOTE — Telephone Encounter (Signed)
Pt stated during visit that he had sleep study done at [facility] with Dr Nadean Corwin. No other information given. Multiple requests for sleep study sent. Spoke with Mindy and made her aware that this is where the patient stated he had his study done. Will send back to triage to make them aware that I have not received anything from this location or anywhere else

## 2013-12-20 NOTE — Telephone Encounter (Signed)
Spoke with Maudie Mercury from [facility]. They are needing pt sleep study faxed to [phone].  Per Ashtyn she still has not received sleep study from Dr. Hedwig Morton.  I called [phone] to get this faxed ASAP to triage. Was advised they do not have the baseline study only the CPAP titration study done 05/2004. They will fax this over to triage. They do not know where the baseline study was done. Ashytn did we request baseline study from another place? Please advise thanks

## 2013-12-21 ENCOUNTER — Encounter: Payer: Self-pay | Admitting: Pulmonary Disease

## 2013-12-21 NOTE — Telephone Encounter (Signed)
Results received and faxed to [facility]. Looks like a sleep study report and titration study was receive. Nothing further needed

## 2014-01-03 ENCOUNTER — Encounter: Payer: Self-pay | Admitting: Internal Medicine

## 2014-04-28 ENCOUNTER — Ambulatory Visit (INDEPENDENT_AMBULATORY_CARE_PROVIDER_SITE_OTHER): Payer: BC Managed Care – PPO | Admitting: Internal Medicine

## 2014-04-28 ENCOUNTER — Encounter: Payer: Self-pay | Admitting: Internal Medicine

## 2014-04-28 VITALS — BP 126/84 | HR 88 | Temp 98.6°F | Resp 16 | Ht 72.0 in | Wt 360.0 lb

## 2014-04-28 DIAGNOSIS — IMO0002 Reserved for concepts with insufficient information to code with codable children: Secondary | ICD-10-CM

## 2014-04-28 DIAGNOSIS — M79604 Pain in right leg: Secondary | ICD-10-CM | POA: Insufficient documentation

## 2014-04-28 DIAGNOSIS — M5416 Radiculopathy, lumbar region: Secondary | ICD-10-CM

## 2014-04-28 DIAGNOSIS — M79605 Pain in left leg: Secondary | ICD-10-CM | POA: Insufficient documentation

## 2014-04-28 MED ORDER — HYDROCODONE-ACETAMINOPHEN 5-325 MG PO TABS: 1.0000 | ORAL_TABLET | Freq: Four times a day (QID) | ORAL | Status: DC | PRN

## 2014-04-28 NOTE — Progress Notes (Signed)
Pre visit review using our clinic review tool, if applicable. No additional management support is needed unless otherwise documented below in the visit note. 

## 2014-04-28 NOTE — Patient Instructions (Signed)
Spinal Stenosis Spinal stenosis is an abnormal narrowing of the canals of your spine (vertebrae). CAUSES  Spinal stenosis is caused by areas of bone pushing into the central canals of your vertebrae. This condition can be present at birth (congenital). It also may be caused by arthritic deterioration of your vertebrae (spinal degeneration).  SYMPTOMS   Pain that is generally worse with activities, particularly standing and walking.  Numbness, tingling, hot or cold sensations, weakness, or weariness in your legs.  Frequent episodes of falling.  A foot-slapping gait that leads to muscle weakness. DIAGNOSIS  Spinal stenosis is diagnosed with the use of magnetic resonance imaging (MRI) or computed tomography (CT). TREATMENT  Initial therapy for spinal stenosis focuses on the management of the pain and other symptoms associated with the condition. These therapies include:  Practicing postural changes to lessen pressure on your nerves.  Exercises to strengthen the core of your body.  Loss of excess body weight.  The use of nonsteroidal anti-inflammatory medications to reduce swelling and inflammation in your nerves. When therapies to manage pain are not successful, surgery to treat spinal stenosis may be recommended. This surgery involves removing excess bone, which puts pressure on your nerve roots. During this surgery (laminectomy), the posterior boney arch (lamina) and excess bone around the facet joints are removed. Document Released: 01/25/2004 Document Revised: 03/01/2013 Document Reviewed: 02/12/2013 [facility] Patient Information 2014 [place].

## 2014-04-28 NOTE — Progress Notes (Signed)
Subjective:    Patient ID: Zachary Murray, male    DOB: Apr 05, 1966, 48 y.o.   MRN: [id number]  Back Pain This is a recurrent problem. The current episode started more than 1 month ago (4 months ago). The problem occurs intermittently. The problem is unchanged. The pain is present in the lumbar spine. The quality of the pain is described as shooting and stabbing. The pain radiates to the right thigh and left thigh. The pain is at a severity of 4/10. The pain is moderate. The pain is worse during the day. Exacerbated by: walking. Associated symptoms include leg pain (in both thighs), numbness (over both calves) and paresthesias. Pertinent negatives include no abdominal pain, bladder incontinence, bowel incontinence, chest pain, dysuria, fever, paresis, pelvic pain, perianal numbness, tingling, weakness or weight loss. Risk factors include obesity and lack of exercise. He has tried NSAIDs for the symptoms. The treatment provided mild relief.      Review of Systems  Constitutional: Negative for fever and weight loss.  Cardiovascular: Negative for chest pain.  Gastrointestinal: Negative for abdominal pain and bowel incontinence.  Genitourinary: Negative for bladder incontinence, dysuria and pelvic pain.  Musculoskeletal: Positive for back pain.  Neurological: Positive for numbness (over both calves) and paresthesias. Negative for tingling and weakness.       Objective:   Physical Exam  Vitals reviewed. Constitutional: He is oriented to person, place, and time. He appears well-developed and well-nourished. No distress.  HENT:  Head: Normocephalic and atraumatic.  Mouth/Throat: Oropharynx is clear and moist. No oropharyngeal exudate.  Eyes: Conjunctivae are normal. Right eye exhibits no discharge. Left eye exhibits no discharge. No scleral icterus.  Neck: Normal range of motion. Neck supple. No JVD present. No tracheal deviation present. No thyromegaly present.  Cardiovascular: Normal rate,  regular rhythm, normal heart sounds and intact distal pulses.  Exam reveals no gallop and no friction rub.   No murmur heard. Pulses:      Carotid pulses are 1+ on the right side, and 1+ on the left side.      Radial pulses are 1+ on the right side, and 1+ on the left side.       Femoral pulses are 1+ on the right side, and 1+ on the left side.      Popliteal pulses are 1+ on the right side, and 1+ on the left side.       Dorsalis pedis pulses are 1+ on the right side, and 1+ on the left side.       Posterior tibial pulses are 1+ on the right side, and 1+ on the left side.  Pulmonary/Chest: Effort normal and breath sounds normal. No stridor. No respiratory distress. He has no wheezes. He has no rales. He exhibits no tenderness.  Abdominal: Soft. Bowel sounds are normal. He exhibits no distension and no mass. There is no tenderness. There is no rebound and no guarding.  Musculoskeletal: Normal range of motion. He exhibits no edema and no tenderness.       Lumbar back: Normal. He exhibits normal range of motion, no tenderness, no bony tenderness, no swelling, no edema, no deformity, no laceration, no pain, no spasm and normal pulse.  Lymphadenopathy:    He has no cervical adenopathy.  Neurological: He is alert and oriented to person, place, and time. He has normal strength. He displays no atrophy and no tremor. No cranial nerve deficit or sensory deficit. He exhibits normal muscle tone. He displays a negative Romberg  sign. He displays no seizure activity. Coordination and gait normal.  Reflex Scores:      Tricep reflexes are 0 on the right side and 0 on the left side.      Bicep reflexes are 0 on the right side and 0 on the left side.      Brachioradialis reflexes are 0 on the right side and 0 on the left side.      Patellar reflexes are 0 on the right side and 0 on the left side.      Achilles reflexes are 0 on the right side and 0 on the left side. Neg SLR in BLE  Skin: Skin is warm and dry.  No rash noted. He is not diaphoretic. No erythema. No pallor.  Psychiatric: He has a normal mood and affect. His behavior is normal. Judgment and thought content normal.     Lab Results  Component Value Date   WBC 5.5 08/12/2013   HGB 14.2 08/12/2013   HCT 40.5 08/12/2013   PLT 208 08/12/2013   GLUCOSE 119* 11/19/2013   CHOL 145 11/19/2013   TRIG 45.0 11/19/2013   HDL 49.20 11/19/2013   LDLCALC 87 11/19/2013   ALT 24 08/12/2013   AST 27 08/12/2013   NA 140 11/19/2013   K 4.2 11/19/2013   CL 101 11/19/2013   CREATININE 0.8 11/19/2013   BUN 18 11/19/2013   CO2 31 11/19/2013   TSH 3.13 11/19/2013   HGBA1C 5.9 11/19/2013       Assessment & Plan:

## 2014-04-29 ENCOUNTER — Telehealth: Payer: Self-pay | Admitting: Internal Medicine

## 2014-04-29 NOTE — Telephone Encounter (Signed)
Relevant patient education assigned to patient using Emmi. ° °

## 2014-04-29 NOTE — Assessment & Plan Note (Signed)
I am concerned that he may have spinal stenosis, DDD, or an HNP so I have ordered an MRI  For now, he will cont taking the nsaids and will also try norco for pain relief

## 2014-05-23 ENCOUNTER — Inpatient Hospital Stay: Admission: RE | Admit: 2014-05-23 | Payer: BC Managed Care – PPO | Source: Ambulatory Visit

## 2014-05-26 ENCOUNTER — Ambulatory Visit: Payer: BC Managed Care – PPO | Admitting: Internal Medicine

## 2014-06-07 ENCOUNTER — Other Ambulatory Visit: Payer: Self-pay | Admitting: Internal Medicine

## 2014-07-09 ENCOUNTER — Inpatient Hospital Stay: Admission: RE | Admit: 2014-07-09 | Payer: BC Managed Care – PPO | Source: Ambulatory Visit

## 2014-07-18 ENCOUNTER — Inpatient Hospital Stay: Admission: RE | Admit: 2014-07-18 | Payer: BC Managed Care – PPO | Source: Ambulatory Visit

## 2014-07-18 ENCOUNTER — Other Ambulatory Visit: Payer: Self-pay | Admitting: Internal Medicine

## 2014-07-23 ENCOUNTER — Ambulatory Visit
Admission: RE | Admit: 2014-07-23 | Discharge: 2014-07-23 | Disposition: A | Discharge status: Home / Self Care | Payer: BC Managed Care – PPO | Source: Ambulatory Visit | Attending: Internal Medicine | Admitting: Internal Medicine

## 2014-07-23 DIAGNOSIS — M5416 Radiculopathy, lumbar region: Secondary | ICD-10-CM

## 2014-07-25 ENCOUNTER — Encounter: Payer: Self-pay | Admitting: Internal Medicine

## 2014-08-01 ENCOUNTER — Other Ambulatory Visit: Payer: Self-pay | Admitting: *Deleted

## 2014-08-01 MED ORDER — LISINOPRIL 10 MG PO TABS: ORAL_TABLET | ORAL | Status: DC

## 2014-08-03 ENCOUNTER — Ambulatory Visit: Payer: BC Managed Care – PPO | Admitting: Internal Medicine

## 2014-08-08 ENCOUNTER — Ambulatory Visit: Payer: BC Managed Care – PPO | Admitting: Internal Medicine

## 2014-11-24 ENCOUNTER — Ambulatory Visit (INDEPENDENT_AMBULATORY_CARE_PROVIDER_SITE_OTHER): Payer: BLUE CROSS/BLUE SHIELD | Admitting: Internal Medicine

## 2014-11-24 ENCOUNTER — Encounter: Payer: Self-pay | Admitting: Internal Medicine

## 2014-11-24 ENCOUNTER — Other Ambulatory Visit (INDEPENDENT_AMBULATORY_CARE_PROVIDER_SITE_OTHER): Payer: BLUE CROSS/BLUE SHIELD

## 2014-11-24 ENCOUNTER — Ambulatory Visit (INDEPENDENT_AMBULATORY_CARE_PROVIDER_SITE_OTHER)
Admission: RE | Admit: 2014-11-24 | Discharge: 2014-11-24 | Disposition: A | Discharge status: Home / Self Care | Payer: BLUE CROSS/BLUE SHIELD | Source: Ambulatory Visit | Attending: Internal Medicine | Admitting: Internal Medicine

## 2014-11-24 VITALS — BP 140/88 | HR 80 | Temp 98.4°F | Resp 16 | Ht 72.0 in | Wt >= 6400 oz

## 2014-11-24 DIAGNOSIS — R0602 Shortness of breath: Secondary | ICD-10-CM

## 2014-11-24 DIAGNOSIS — J45909 Unspecified asthma, uncomplicated: Secondary | ICD-10-CM | POA: Insufficient documentation

## 2014-11-24 DIAGNOSIS — R791 Abnormal coagulation profile: Secondary | ICD-10-CM

## 2014-11-24 DIAGNOSIS — H109 Unspecified conjunctivitis: Secondary | ICD-10-CM | POA: Insufficient documentation

## 2014-11-24 DIAGNOSIS — R059 Cough, unspecified: Secondary | ICD-10-CM | POA: Insufficient documentation

## 2014-11-24 DIAGNOSIS — R05 Cough: Secondary | ICD-10-CM

## 2014-11-24 DIAGNOSIS — M629 Disorder of muscle, unspecified: Secondary | ICD-10-CM

## 2014-11-24 DIAGNOSIS — I10 Essential (primary) hypertension: Secondary | ICD-10-CM

## 2014-11-24 DIAGNOSIS — M6289 Other specified disorders of muscle: Secondary | ICD-10-CM

## 2014-11-24 DIAGNOSIS — R7989 Other specified abnormal findings of blood chemistry: Secondary | ICD-10-CM

## 2014-11-24 LAB — URINALYSIS, ROUTINE W REFLEX MICROSCOPIC
Bilirubin Urine: NEGATIVE
Hgb urine dipstick: NEGATIVE
Ketones, ur: NEGATIVE
Leukocytes, UA: NEGATIVE
Nitrite: NEGATIVE
RBC / HPF: NONE SEEN (ref 0–?)
Specific Gravity, Urine: <=1.005 — AB (ref 1.000–1.030)
Total Protein, Urine: NEGATIVE
Urine Glucose: NEGATIVE
Urobilinogen, UA: 0.2 (ref 0.0–1.0)
pH: 7 (ref 5.0–8.0)

## 2014-11-24 LAB — CBC WITH DIFFERENTIAL/PLATELET
Basophils Absolute: 0 10*3/uL (ref 0.0–0.1)
Basophils Relative: 0.4 % (ref 0.0–3.0)
Eosinophils Absolute: 0.3 10*3/uL (ref 0.0–0.7)
Eosinophils Relative: 3.2 % (ref 0.0–5.0)
HCT: 43.2 % (ref 39.0–52.0)
Hemoglobin: 14.5 g/dL (ref 13.0–17.0)
Lymphocytes Relative: 13 % (ref 12.0–46.0)
Lymphs Abs: 1.2 10*3/uL (ref 0.7–4.0)
MCHC: 33.5 g/dL (ref 30.0–36.0)
MCV: 90.9 fl (ref 78.0–100.0)
Monocytes Absolute: 1.1 10*3/uL — ABNORMAL HIGH (ref 0.1–1.0)
Monocytes Relative: 12.1 % — ABNORMAL HIGH (ref 3.0–12.0)
Neutro Abs: 6.4 10*3/uL (ref 1.4–7.7)
Neutrophils Relative %: 71.3 % (ref 43.0–77.0)
Platelets: 287 10*3/uL (ref 150.0–400.0)
RBC: 4.76 Mil/uL (ref 4.22–5.81)
RDW: 13.7 % (ref 11.5–15.5)
WBC: 8.9 10*3/uL (ref 4.0–10.5)

## 2014-11-24 LAB — COMPREHENSIVE METABOLIC PANEL
ALT: 25 U/L (ref 0–53)
AST: 30 U/L (ref 0–37)
Albumin: 4.1 g/dL (ref 3.5–5.2)
Alkaline Phosphatase: 107 U/L (ref 39–117)
BUN: 16 mg/dL (ref 6–23)
CO2: 32 mEq/L (ref 19–32)
Calcium: 10.1 mg/dL (ref 8.4–10.5)
Chloride: 97 mEq/L (ref 96–112)
Creatinine, Ser: 0.8 mg/dL (ref 0.4–1.5)
GFR: 113.21 mL/min (ref >60.00)
Glucose, Bld: 126 mg/dL — ABNORMAL HIGH (ref 70–99)
Potassium: 4.9 mEq/L (ref 3.5–5.1)
Sodium: 140 mEq/L (ref 135–145)
Total Bilirubin: 0.7 mg/dL (ref 0.2–1.2)
Total Protein: 7.6 g/dL (ref 6.0–8.3)

## 2014-11-24 LAB — TROPONIN I: TNIDX: 0 ug/l (ref 0.00–0.06)

## 2014-11-24 LAB — TSH: TSH: 4.65 u[IU]/mL — ABNORMAL HIGH (ref 0.35–4.50)

## 2014-11-24 LAB — CARDIAC PANEL
CK-MB: 3.9 ng/mL (ref 0.3–4.0)
Relative Index: 0.8 calc (ref 0.0–2.5)
Total CK: 507 U/L — ABNORMAL HIGH (ref 7–232)

## 2014-11-24 LAB — BRAIN NATRIURETIC PEPTIDE: Pro B Natriuretic peptide (BNP): 47 pg/mL (ref 0.0–100.0)

## 2014-11-24 MED ORDER — LISINOPRIL 10 MG PO TABS: ORAL_TABLET | ORAL | Status: DC

## 2014-11-24 MED ORDER — TOBRAMYCIN-DEXAMETHASONE 0.3-0.1 % OP SUSP: 2.0000 [drp] | Freq: Four times a day (QID) | OPHTHALMIC | Status: DC

## 2014-11-24 MED ORDER — HYDROCODONE-HOMATROPINE 5-1.5 MG/5ML PO SYRP: 5.0000 mL | ORAL_SOLUTION | Freq: Three times a day (TID) | ORAL | Status: DC | PRN

## 2014-11-24 MED ORDER — TRIAMTERENE-HCTZ 37.5-25 MG PO TABS: 1.0000 | ORAL_TABLET | Freq: Every day | ORAL | Status: DC

## 2014-11-24 NOTE — Patient Instructions (Signed)
Cough, Adult  A cough is a reflex that helps clear your throat and airways. It can help heal the body or may be a reaction to an irritated airway. A cough may only last 2 or 3 weeks (acute) or may last more than 8 weeks (chronic).  CAUSES Acute cough:  Viral or bacterial infections. Chronic cough:  Infections.  Allergies.  Asthma.  Post-nasal drip.  Smoking.  Heartburn or acid reflux.  Some medicines.  Chronic lung problems (COPD).  Cancer. SYMPTOMS   Cough.  Fever.  Chest pain.  Increased breathing rate.  High-pitched whistling sound when breathing (wheezing).  Colored mucus that you cough up (sputum). TREATMENT   A bacterial cough may be treated with antibiotic medicine.  A viral cough must run its course and will not respond to antibiotics.  Your caregiver may recommend other treatments if you have a chronic cough. HOME CARE INSTRUCTIONS   Only take over-the-counter or prescription medicines for pain, discomfort, or fever as directed by your caregiver. Use cough suppressants only as directed by your caregiver.  Use a cold steam vaporizer or humidifier in your bedroom or home to help loosen secretions.  Sleep in a semi-upright position if your cough is worse at night.  Rest as needed.  Stop smoking if you smoke. SEEK IMMEDIATE MEDICAL CARE IF:   You have pus in your sputum.  Your cough starts to worsen.  You cannot control your cough with suppressants and are losing sleep.  You begin coughing up blood.  You have difficulty breathing.  You develop pain which is getting worse or is uncontrolled with medicine.  You have a fever. MAKE SURE YOU:   Understand these instructions.  Will watch your condition.  Will get help right away if you are not doing well or get worse. Document Released: 05/03/2011 Document Revised: 01/27/2012 Document Reviewed: 05/03/2011 [employer] Patient Information 2015 [employer]. This information is not intended  to replace advice given to you by your health care provider. Make sure you discuss any questions you have with your health care provider.  

## 2014-11-24 NOTE — Progress Notes (Signed)
Subjective:    Patient ID: Zachary Murray, male    DOB: 01-Oct-1966, 49 y.o.   MRN: [id number]  Cough This is a recurrent problem. The current episode started 1 to 4 weeks ago. The problem has been unchanged. The problem occurs every few hours. The cough is non-productive. Associated symptoms include eye redness, nasal congestion and shortness of breath. Pertinent negatives include no chest pain, chills, ear congestion, ear pain, fever, headaches, heartburn, hemoptysis, myalgias, postnasal drip, rash, rhinorrhea, sore throat, sweats, weight loss or wheezing. He has tried OTC cough suppressant for the symptoms. The treatment provided no relief. There is no history of asthma, bronchiectasis, bronchitis, COPD, emphysema, environmental allergies or pneumonia.      Review of Systems  Constitutional: Negative.  Negative for fever, chills, weight loss, diaphoresis, appetite change and fatigue.  HENT: Negative.  Negative for ear pain, postnasal drip, rhinorrhea and sore throat.   Eyes: Positive for redness and itching. Negative for photophobia, pain, discharge and visual disturbance.  Respiratory: Positive for cough and shortness of breath. Negative for apnea, hemoptysis, choking, chest tightness, wheezing and stridor.   Cardiovascular: Positive for leg swelling (he complains of pain and swelling in both legs that has worsened recently). Negative for chest pain and palpitations.  Gastrointestinal: Negative.  Negative for heartburn, nausea, vomiting, abdominal pain, diarrhea, constipation and blood in stool.  Endocrine: Negative.   Genitourinary: Negative.   Musculoskeletal: Positive for arthralgias (he has intermittent pain in his left hamstring). Negative for myalgias, back pain, joint swelling, neck pain and neck stiffness.  Skin: Negative.  Negative for rash.  Allergic/Immunologic: Negative.  Negative for environmental allergies.  Neurological: Negative.  Negative for dizziness, syncope,  light-headedness, numbness and headaches.  Hematological: Negative.  Negative for adenopathy. Does not bruise/bleed easily.  Psychiatric/Behavioral: Negative.        Objective:   Physical Exam  Constitutional: He is oriented to person, place, and time. He appears well-developed and well-nourished.  Non-toxic appearance. He does not have a sickly appearance. He does not appear ill. No distress.  HENT:  Head: Normocephalic and atraumatic.  Mouth/Throat: Oropharynx is clear and moist. No oropharyngeal exudate.  Eyes: EOM are normal. Pupils are equal, round, and reactive to light. Right eye exhibits no chemosis, no discharge, no exudate and no hordeolum. No foreign body present in the right eye. Left eye exhibits no chemosis, no discharge, no exudate and no hordeolum. No foreign body present in the left eye. Right conjunctiva is injected. Right conjunctiva has no hemorrhage. Left conjunctiva is injected. Left conjunctiva has no hemorrhage. No scleral icterus.    Neck: Normal range of motion. Neck supple. No JVD present. No tracheal deviation present. No thyromegaly present.  Cardiovascular: Normal rate, regular rhythm, normal heart sounds and intact distal pulses.  Exam reveals no gallop and no friction rub.   No murmur heard. Pulmonary/Chest: Effort normal and breath sounds normal. No stridor. No respiratory distress. He has no wheezes. He has no rales. He exhibits no tenderness.  Abdominal: Soft. Bowel sounds are normal. He exhibits no distension and no mass. There is no tenderness. There is no rebound and no guarding.  Musculoskeletal: Normal range of motion. He exhibits edema (1+ non-pitting edema in BLE). He exhibits no tenderness.       Left upper leg: Normal. He exhibits no tenderness, no bony tenderness, no swelling, no edema, no deformity and no laceration.  Lymphadenopathy:    He has no cervical adenopathy.  Neurological: He is oriented to  person, place, and time.  Skin: Skin is warm  and dry. No rash noted. He is not diaphoretic. No erythema. No pallor.  Psychiatric: He has a normal mood and affect. His behavior is normal. Judgment and thought content normal.  Vitals reviewed.   Lab Results  Component Value Date   WBC 8.9 11/24/2014   HGB 14.5 11/24/2014   HCT 43.2 11/24/2014   PLT 287.0 11/24/2014   GLUCOSE 126* 11/24/2014   CHOL 145 11/19/2013   TRIG 45.0 11/19/2013   HDL 49.20 11/19/2013   LDLCALC 87 11/19/2013   ALT 25 11/24/2014   AST 30 11/24/2014   NA 140 11/24/2014   K 4.9 11/24/2014   CL 97 11/24/2014   CREATININE 0.8 11/24/2014   BUN 16 11/24/2014   CO2 32 11/24/2014   TSH 4.65* 11/24/2014   HGBA1C 5.9 11/19/2013        Assessment & Plan:

## 2014-11-25 ENCOUNTER — Encounter: Payer: Self-pay | Admitting: Internal Medicine

## 2014-11-25 ENCOUNTER — Ambulatory Visit (HOSPITAL_COMMUNITY)
Admission: RE | Admit: 2014-11-25 | Discharge: 2014-11-25 | Disposition: A | Discharge status: Home / Self Care | Payer: BLUE CROSS/BLUE SHIELD | Source: Ambulatory Visit | Attending: Internal Medicine | Admitting: Internal Medicine

## 2014-11-25 ENCOUNTER — Other Ambulatory Visit: Payer: Self-pay | Admitting: Internal Medicine

## 2014-11-25 ENCOUNTER — Encounter (HOSPITAL_COMMUNITY): Payer: Self-pay

## 2014-11-25 ENCOUNTER — Inpatient Hospital Stay: Admission: RE | Admit: 2014-11-25 | Payer: BLUE CROSS/BLUE SHIELD | Source: Ambulatory Visit

## 2014-11-25 DIAGNOSIS — R0602 Shortness of breath: Secondary | ICD-10-CM | POA: Insufficient documentation

## 2014-11-25 DIAGNOSIS — R791 Abnormal coagulation profile: Secondary | ICD-10-CM | POA: Insufficient documentation

## 2014-11-25 DIAGNOSIS — J189 Pneumonia, unspecified organism: Secondary | ICD-10-CM | POA: Insufficient documentation

## 2014-11-25 DIAGNOSIS — R05 Cough: Secondary | ICD-10-CM | POA: Diagnosis not present

## 2014-11-25 DIAGNOSIS — R7989 Other specified abnormal findings of blood chemistry: Secondary | ICD-10-CM | POA: Insufficient documentation

## 2014-11-25 LAB — D-DIMER, QUANTITATIVE (NOT AT ARMC): D-Dimer, Quant: 1 ug/mL-FEU — ABNORMAL HIGH (ref 0.00–0.48)

## 2014-11-25 MED ORDER — IOHEXOL 350 MG/ML SOLN
80.0000 mL | Freq: Once | INTRAVENOUS | Status: AC | PRN
  Administered 2014-11-25: 80 mL via INTRAVENOUS

## 2014-11-25 MED ORDER — MOXIFLOXACIN HCL 400 MG PO TABS: 400.0000 mg | ORAL_TABLET | Freq: Every day | ORAL | Status: DC

## 2014-11-25 NOTE — Assessment & Plan Note (Signed)
This appears to be related to the Viral URI Will treat with tobradex

## 2014-11-25 NOTE — Assessment & Plan Note (Signed)
EKG is WNL, cardiac enzymes and BNP are WNL CXR is normal D-dimer is slightly elevated - will get a CT angio done to screen for PE This appears to be related to a Viral URI +++ obesity, will follow this for now

## 2014-11-25 NOTE — Assessment & Plan Note (Signed)
He is morbidly obese and is therefore high risk for PE Will get a CT angio done

## 2014-11-25 NOTE — Assessment & Plan Note (Signed)
CXR is WNL This appears to be a Viral URI Will give a cough suppressant

## 2014-11-25 NOTE — Assessment & Plan Note (Signed)
Will refer to sports med for further evaluation

## 2014-11-25 NOTE — Assessment & Plan Note (Signed)
His BP is well controlled Will monitor his lytes and renal function 

## 2014-12-08 ENCOUNTER — Encounter: Payer: Self-pay | Admitting: Internal Medicine

## 2014-12-08 ENCOUNTER — Ambulatory Visit (INDEPENDENT_AMBULATORY_CARE_PROVIDER_SITE_OTHER)
Admission: RE | Admit: 2014-12-08 | Discharge: 2014-12-08 | Disposition: A | Discharge status: Home / Self Care | Payer: BLUE CROSS/BLUE SHIELD | Source: Ambulatory Visit | Attending: Internal Medicine | Admitting: Internal Medicine

## 2014-12-08 ENCOUNTER — Ambulatory Visit (INDEPENDENT_AMBULATORY_CARE_PROVIDER_SITE_OTHER): Payer: BLUE CROSS/BLUE SHIELD | Admitting: Internal Medicine

## 2014-12-08 VITALS — BP 110/70 | HR 101 | Temp 98.5°F | Resp 16 | Ht 72.0 in

## 2014-12-08 DIAGNOSIS — J189 Pneumonia, unspecified organism: Secondary | ICD-10-CM

## 2014-12-08 DIAGNOSIS — E038 Other specified hypothyroidism: Secondary | ICD-10-CM

## 2014-12-08 DIAGNOSIS — J4520 Mild intermittent asthma, uncomplicated: Secondary | ICD-10-CM

## 2014-12-08 DIAGNOSIS — R059 Cough, unspecified: Secondary | ICD-10-CM

## 2014-12-08 DIAGNOSIS — R05 Cough: Secondary | ICD-10-CM

## 2014-12-08 DIAGNOSIS — E039 Hypothyroidism, unspecified: Secondary | ICD-10-CM

## 2014-12-08 HISTORY — DX: Hypothyroidism, unspecified: E03.9

## 2014-12-08 MED ORDER — LEVOTHYROXINE SODIUM 50 MCG PO TABS: 50.0000 ug | ORAL_TABLET | Freq: Every day | ORAL | Status: DC

## 2014-12-08 MED ORDER — FLUTICASONE FUROATE-VILANTEROL 200-25 MCG/INH IN AEPB: 1.0000 | INHALATION_SPRAY | Freq: Every day | RESPIRATORY_TRACT | Status: DC

## 2014-12-08 NOTE — Progress Notes (Signed)
Subjective:    Patient ID: Zachary Murray, male    DOB: 18-Dec-1965, 49 y.o.   MRN: [id number]  Cough This is a recurrent problem. The current episode started 1 to 4 weeks ago. The problem has been gradually improving. The problem occurs every few hours. The cough is non-productive. Associated symptoms include chills, shortness of breath and wheezing. Pertinent negatives include no chest pain, ear congestion, ear pain, fever, headaches, heartburn, hemoptysis, myalgias, nasal congestion, postnasal drip, rash, rhinorrhea, sore throat, sweats or weight loss. He has tried prescription cough suppressant for the symptoms. The treatment provided moderate relief. His past medical history is significant for pneumonia. There is no history of asthma, bronchiectasis, bronchitis, COPD, emphysema or environmental allergies.      Review of Systems  Constitutional: Positive for chills. Negative for fever, weight loss, diaphoresis and fatigue.  HENT: Negative.  Negative for ear pain, postnasal drip, rhinorrhea, sore throat, trouble swallowing and voice change.   Eyes: Negative.   Respiratory: Positive for cough, shortness of breath and wheezing. Negative for hemoptysis, choking, chest tightness and stridor.   Cardiovascular: Negative.  Negative for chest pain, palpitations and leg swelling.  Gastrointestinal: Negative.  Negative for heartburn, nausea, vomiting, abdominal pain, diarrhea, constipation and blood in stool.  Endocrine: Negative.   Genitourinary: Negative.   Musculoskeletal: Negative.  Negative for myalgias.  Skin: Negative.  Negative for rash.  Allergic/Immunologic: Negative.  Negative for environmental allergies.  Neurological: Negative.  Negative for dizziness and headaches.  Hematological: Negative.  Negative for adenopathy. Does not bruise/bleed easily.  Psychiatric/Behavioral: Negative.        Objective:   Physical Exam  Constitutional: He is oriented to person, place, and time. He  appears well-developed and well-nourished.  Non-toxic appearance. He does not have a sickly appearance. He does not appear ill. No distress.  HENT:  Head: Normocephalic and atraumatic.  Mouth/Throat: Oropharynx is clear and moist. No oropharyngeal exudate.  Eyes: Conjunctivae are normal. Right eye exhibits no discharge. Left eye exhibits no discharge. No scleral icterus.  Neck: Normal range of motion. Neck supple. No JVD present. No tracheal deviation present. No thyromegaly present.  Cardiovascular: Normal rate, regular rhythm, normal heart sounds and intact distal pulses.  Exam reveals no gallop and no friction rub.   No murmur heard. Pulmonary/Chest: Effort normal. No accessory muscle usage or stridor. No tachypnea. No respiratory distress. He has no decreased breath sounds. He has wheezes in the right upper field. He has rhonchi in the right upper field. He has no rales. He exhibits no tenderness.  Abdominal: Soft. Bowel sounds are normal. He exhibits no distension and no mass. There is no tenderness. There is no rebound and no guarding.  Musculoskeletal: Normal range of motion. He exhibits no edema or tenderness.  Lymphadenopathy:    He has no cervical adenopathy.  Neurological: He is oriented to person, place, and time.  Skin: Skin is warm and dry. No rash noted. He is not diaphoretic. No erythema. No pallor.  Vitals reviewed.    Lab Results  Component Value Date   WBC 8.9 11/24/2014   HGB 14.5 11/24/2014   HCT 43.2 11/24/2014   PLT 287.0 11/24/2014   GLUCOSE 126* 11/24/2014   CHOL 145 11/19/2013   TRIG 45.0 11/19/2013   HDL 49.20 11/19/2013   LDLCALC 87 11/19/2013   ALT 25 11/24/2014   AST 30 11/24/2014   NA 140 11/24/2014   K 4.9 11/24/2014   CL 97 11/24/2014   CREATININE 0.8  11/24/2014   BUN 16 11/24/2014   CO2 32 11/24/2014   TSH 4.65* 11/24/2014   HGBA1C 5.9 11/19/2013       Assessment & Plan:

## 2014-12-08 NOTE — Progress Notes (Signed)
Pre visit review using our clinic review tool, if applicable. No additional management support is needed unless otherwise documented below in the visit note. 

## 2014-12-08 NOTE — Patient Instructions (Signed)
Cough, Adult  A cough is a reflex that helps clear your throat and airways. It can help heal the body or may be a reaction to an irritated airway. A cough may only last 2 or 3 weeks (acute) or may last more than 8 weeks (chronic).  CAUSES Acute cough:  Viral or bacterial infections. Chronic cough:  Infections.  Allergies.  Asthma.  Post-nasal drip.  Smoking.  Heartburn or acid reflux.  Some medicines.  Chronic lung problems (COPD).  Cancer. SYMPTOMS   Cough.  Fever.  Chest pain.  Increased breathing rate.  High-pitched whistling sound when breathing (wheezing).  Colored mucus that you cough up (sputum). TREATMENT   A bacterial cough may be treated with antibiotic medicine.  A viral cough must run its course and will not respond to antibiotics.  Your caregiver may recommend other treatments if you have a chronic cough. HOME CARE INSTRUCTIONS   Only take over-the-counter or prescription medicines for pain, discomfort, or fever as directed by your caregiver. Use cough suppressants only as directed by your caregiver.  Use a cold steam vaporizer or humidifier in your bedroom or home to help loosen secretions.  Sleep in a semi-upright position if your cough is worse at night.  Rest as needed.  Stop smoking if you smoke. SEEK IMMEDIATE MEDICAL CARE IF:   You have pus in your sputum.  Your cough starts to worsen.  You cannot control your cough with suppressants and are losing sleep.  You begin coughing up blood.  You have difficulty breathing.  You develop pain which is getting worse or is uncontrolled with medicine.  You have a fever. MAKE SURE YOU:   Understand these instructions.  Will watch your condition.  Will get help right away if you are not doing well or get worse. Document Released: 05/03/2011 Document Revised: 01/27/2012 Document Reviewed: 05/03/2011 [employer] Patient Information 2015 [employer]. This information is not intended  to replace advice given to you by your health care provider. Make sure you discuss any questions you have with your health care provider.  

## 2014-12-12 ENCOUNTER — Encounter: Payer: Self-pay | Admitting: Internal Medicine

## 2014-12-12 NOTE — Assessment & Plan Note (Signed)
His cough persists and I am concerned that the ACEI may be a culprit so I have discontinued it

## 2014-12-12 NOTE — Assessment & Plan Note (Signed)
I have asked him to start a LABA/ICS combination for this I gave him samples of breo and showed him how to use it, he demonstrated proficiency with its use.

## 2014-12-12 NOTE — Assessment & Plan Note (Signed)
He appears to be improving His repeat CXR shows atelectasis but no complications

## 2015-02-27 ENCOUNTER — Other Ambulatory Visit: Payer: Self-pay | Admitting: Internal Medicine

## 2015-06-15 ENCOUNTER — Other Ambulatory Visit: Payer: Self-pay | Admitting: Internal Medicine

## 2015-09-29 ENCOUNTER — Other Ambulatory Visit: Payer: Self-pay

## 2015-09-29 MED ORDER — TRIAMTERENE-HCTZ 37.5-25 MG PO TABS: 1.0000 | ORAL_TABLET | Freq: Every day | ORAL | Status: DC

## 2015-10-26 ENCOUNTER — Encounter: Payer: Self-pay | Admitting: Internal Medicine

## 2015-10-26 ENCOUNTER — Ambulatory Visit (INDEPENDENT_AMBULATORY_CARE_PROVIDER_SITE_OTHER): Payer: BLUE CROSS/BLUE SHIELD | Admitting: Internal Medicine

## 2015-10-26 ENCOUNTER — Other Ambulatory Visit: Payer: Self-pay | Admitting: Internal Medicine

## 2015-10-26 ENCOUNTER — Other Ambulatory Visit (INDEPENDENT_AMBULATORY_CARE_PROVIDER_SITE_OTHER): Payer: BLUE CROSS/BLUE SHIELD

## 2015-10-26 VITALS — BP 136/90 | HR 98 | Temp 98.4°F | Resp 20 | Ht 72.0 in | Wt >= 6400 oz

## 2015-10-26 DIAGNOSIS — I10 Essential (primary) hypertension: Secondary | ICD-10-CM

## 2015-10-26 DIAGNOSIS — R197 Diarrhea, unspecified: Secondary | ICD-10-CM | POA: Insufficient documentation

## 2015-10-26 DIAGNOSIS — R946 Abnormal results of thyroid function studies: Secondary | ICD-10-CM | POA: Diagnosis not present

## 2015-10-26 DIAGNOSIS — R739 Hyperglycemia, unspecified: Secondary | ICD-10-CM | POA: Diagnosis not present

## 2015-10-26 DIAGNOSIS — R7989 Other specified abnormal findings of blood chemistry: Secondary | ICD-10-CM

## 2015-10-26 DIAGNOSIS — Z23 Encounter for immunization: Secondary | ICD-10-CM

## 2015-10-26 DIAGNOSIS — G4733 Obstructive sleep apnea (adult) (pediatric): Secondary | ICD-10-CM

## 2015-10-26 DIAGNOSIS — E118 Type 2 diabetes mellitus with unspecified complications: Secondary | ICD-10-CM | POA: Insufficient documentation

## 2015-10-26 LAB — TSH: TSH: 4.3 u[IU]/mL (ref 0.35–4.50)

## 2015-10-26 LAB — BASIC METABOLIC PANEL
BUN: 11 mg/dL (ref 6–23)
CO2: 34 mEq/L — ABNORMAL HIGH (ref 19–32)
Calcium: 9.8 mg/dL (ref 8.4–10.5)
Chloride: 96 mEq/L (ref 96–112)
Creatinine, Ser: 0.74 mg/dL (ref 0.40–1.50)
GFR: 118.09 mL/min (ref >60.00)
Glucose, Bld: 162 mg/dL — ABNORMAL HIGH (ref 70–99)
Potassium: 4.1 mEq/L (ref 3.5–5.1)
Sodium: 137 mEq/L (ref 135–145)

## 2015-10-26 LAB — HEMOGLOBIN A1C: Hgb A1c MFr Bld: 8.2 % — ABNORMAL HIGH (ref 4.6–6.5)

## 2015-10-26 LAB — T3, FREE: T3, Free: 3.3 pg/mL (ref 2.3–4.2)

## 2015-10-26 MED ORDER — SAXAGLIPTIN-METFORMIN ER 2.5-1000 MG PO TB24: 2.0000 | ORAL_TABLET | Freq: Every day | ORAL | Status: DC

## 2015-10-26 NOTE — Patient Instructions (Signed)
Chronic Diarrhea Diarrhea is frequent loose and watery bowel movements. It can cause you to feel weak and dehydrated. Dehydration can cause you to become tired and thirsty and to have a dry mouth, decreased urination, and dark yellow urine. Diarrhea is a sign of another problem, most often an infection that will not last long. In most cases, diarrhea lasts 2-3 days. Diarrhea that lasts longer than 4 weeks is called long-lasting (chronic) diarrhea. It is important to treat your diarrhea as directed by your health care provider to lessen or prevent future episodes of diarrhea.  CAUSES  There are many causes of chronic diarrhea. The following are some possible causes:   Gastrointestinal infections caused by viruses, bacteria, or parasites.   Food poisoning or food allergies.   Certain medicines, such as antibiotics, chemotherapy, and laxatives.   Artificial sweeteners and fructose.   Digestive disorders, such as celiac disease and inflammatory bowel diseases.   Irritable bowel syndrome.  Some disorders of the pancreas.  Disorders of the thyroid.  Reduced blood flow to the intestines.  Cancer. Sometimes the cause of chronic diarrhea is unknown. RISK FACTORS  Having a severely weakened immune system, such as from HIV or AIDS.   Taking certain types of cancer-fighting drugs (such as with chemotherapy) or other medicines.   Having had a recent organ transplant.   Having a portion of the stomach or small bowel removed.   Traveling to countries where food and water supplies are often contaminated.  SYMPTOMS  In addition to frequent, loose stools, diarrhea may cause:   Cramping.   Abdominal pain.   Nausea.   Fever.  Fatigue.  Urgent need to use the bathroom.  Loss of bowel control. DIAGNOSIS  Your health care provider must take a careful history and perform a physical exam. Tests given are based on your symptoms and history. Tests may include:   Blood or  stool tests. Three or more stool samples may be examined. Stool cultures may be used to test for bacteria or parasites.   X-rays.   A procedure in which a thin tube is inserted into the mouth or rectum (endoscopy). This allows the health care provider to look inside the intestine.  TREATMENT   Treatment is aimed at correcting the cause of the diarrhea when possible.  Diarrhea caused by an infection can often be treated with antibiotic medicines.  Diarrhea not caused by an infection may require you to take long-term medicine or have surgery. Specific treatment should be discussed with your health care provider.  If the cause cannot be determined, treatment aims to relieve symptoms and prevent dehydration. Serious health problems can occur if you do not maintain proper fluid levels. Treatment may include:  Taking an oral rehydration solution (ORS).  Not drinking beverages that contain caffeine (such as tea, coffee, and soft drinks).  Not drinking alcohol.  Maintaining well-balanced nutrition to help you recover faster. HOME CARE INSTRUCTIONS   Drink enough fluids to keep urine clear or pale yellow. Drink 1 cup (8 oz) of fluid for each diarrhea episode. Avoid fluids that contain simple sugars, fruit juices, whole milk products, and sodas. Hydrate with an ORS. You may purchase the ORS or prepare it at home by mixing the following ingredients together:   - tsp (1.7-3  mL) table salt.   tsp (3  mL) baking soda.   tsp (1.7 mL) salt substitute containing potassium chloride.  1 tbsp (20 mL) sugar.  4.2 c (1 L) of water.     Certain foods and beverages may increase the speed at which food moves through the gastrointestinal (GI) tract. These foods and beverages should be avoided. They include:  Caffeinated and alcoholic beverages.  High-fiber foods, such as raw fruits and vegetables, nuts, seeds, and whole grain breads and cereals.  Foods and beverages sweetened with sugar  alcohols, such as xylitol, sorbitol, and mannitol.   Some foods may be well tolerated and may help thicken stool. These include:  Starchy foods, such as rice, toast, pasta, low-sugar cereal, oatmeal, grits, baked potatoes, crackers, and bagels.  Bananas.  Applesauce.  Add probiotic-rich foods to help increase healthy bacteria in the GI tract. These include yogurt and fermented milk products.  Wash your hands well after each diarrhea episode.  Only take over-the-counter or prescription medicines as directed by your health care provider.  Take a warm bath to relieve any burning or pain from frequent diarrhea episodes. SEEK MEDICAL CARE IF:   You are not urinating as often.  Your urine is a dark color.  You become very tired or dizzy.  You have severe pain in the abdomen or rectum.  Your have blood or pus in your stools.  Your stools look black and tarry. SEEK IMMEDIATE MEDICAL CARE IF:   You are unable to keep fluids down.  You have persistent vomiting.  You have blood in your stool.  Your stools are black and tarry.  You do not urinate in 6-8 hours, or there is only a small amount of very dark urine.  You have abdominal pain that increases or localizes.  You have weakness, dizziness, confusion, or lightheadedness.  You have a severe headache.  Your diarrhea gets worse or does not get better.  You have a fever or persistent symptoms for more than 2-3 days.  You have a fever and your symptoms suddenly get worse. MAKE SURE YOU:   Understand these instructions.  Will watch your condition.  Will get help right away if you are not doing well or get worse.   This information is not intended to replace advice given to you by your health care provider. Make sure you discuss any questions you have with your health care provider.   Document Released: 01/25/2004 Document Revised: 11/09/2013 Document Reviewed: 04/29/2013 Elsevier Interactive Patient Education 2016  [employer]  

## 2015-10-26 NOTE — Progress Notes (Signed)
Pre visit review using our clinic review tool, if applicable. No additional management support is needed unless otherwise documented below in the visit note. 

## 2015-10-26 NOTE — Progress Notes (Signed)
Subjective:  Patient ID: Zachary Murray, male    DOB: 1966-11-09  Age: 49 y.o. MRN: [id number]  CC: Hypothyroidism; Hypertension; and Diarrhea   [name] presents for follow-up. He has not felt well for many months. He complains of weight gain, shortness of breath, sleep apnea, fatigue, insomnia, one loose bowel movement per day, blurred vision, brain fog, and bilateral knee pain. He is concerned that there may be yeast in his stool and it may be invading his body. He is thinking about filing for disability. He has had no wheezing so he stopped using his inhaler. He decided to stop taking his thyroid replacement medication.  Outpatient Prescriptions Prior to Visit  Medication Sig Dispense Refill  . Cholecalciferol (VITAMIN D3) 1000 UNITS CAPS Take 1 capsule by mouth daily.    Marland Kitchen triamterene-hydrochlorothiazide (MAXZIDE-25) 37.5-25 MG per tablet Take 1 tablet by mouth daily. 90 tablet 0  . Misc Natural Products (GLUCOSAMINE CHONDROITIN ADV PO) Take by mouth. Take 1 cap/tablet daily (3000/45mcg)    . Cyanocobalamin (VITAMIN B-12) 5000 MCG SUBL Place 1 tablet under the tongue daily.    . Multiple Vitamins-Minerals (MULTIVITAMIN WITH MINERALS) tablet Take 1 tablet by mouth daily.    Marland Kitchen 5-Hydroxytryptophan (5-HTP MAXIMUM STRENGTH) 200 MG CAPS Take 2 capsules by mouth daily.    . Fluticasone Furoate-Vilanterol (BREO ELLIPTA) 200-25 MCG/INH AEPB Inhale 1 puff into the lungs daily. (Patient not taking: Reported on 10/26/2015) 56 each 0  . HYDROcodone-acetaminophen (NORCO/VICODIN) 5-325 MG per tablet Take 1 tablet by mouth every 6 (six) hours as needed for moderate pain. (Patient not taking: Reported on 10/26/2015) 90 tablet 0  . levothyroxine (SYNTHROID, LEVOTHROID) 50 MCG tablet Take 1 tablet (50 mcg total) by mouth daily. (Patient not taking: Reported on 10/26/2015) 90 tablet 1  . triamterene-hydrochlorothiazide (MAXZIDE-25) 37.5-25 MG tablet Take 1 tablet by mouth daily. 90 tablet 0  . UNABLE TO  FIND Med Name: [facility] -- QD     No facility-administered medications prior to visit.    ROS Review of Systems  Constitutional: Positive for activity change, fatigue and unexpected weight change. Negative for fever, chills, diaphoresis and appetite change.  HENT: Negative.   Eyes: Negative.   Respiratory: Positive for shortness of breath. Negative for cough, choking, chest tightness, wheezing and stridor.   Cardiovascular: Negative.  Negative for chest pain, palpitations and leg swelling.  Gastrointestinal: Positive for diarrhea. Negative for nausea, vomiting, abdominal pain, constipation and blood in stool.  Endocrine: Negative.  Negative for polydipsia and polyuria.  Genitourinary: Negative.  Negative for dysuria, urgency, frequency, hematuria, flank pain, decreased urine volume, enuresis and difficulty urinating.  Musculoskeletal: Positive for arthralgias. Negative for myalgias, back pain, gait problem and neck pain.  Skin: Negative.  Negative for color change and rash.  Allergic/Immunologic: Negative.   Neurological: Negative.  Negative for dizziness, tremors, weakness, light-headedness, numbness and headaches.  Hematological: Negative.  Negative for adenopathy. Does not bruise/bleed easily.  Psychiatric/Behavioral: Negative.     Objective:  BP 136/90 mmHg  Pulse 98  Temp(Src) 98.4 F (36.9 C) (Oral)  Resp 20  Ht 6' (1.829 m)  Wt 420 lb 8 oz (190.738 kg)  BMI 57.02 kg/m2  SpO2 93%  BP Readings from Last 3 Encounters:  10/26/15 136/90  12/08/14 110/70  11/24/14 140/88    Wt Readings from Last 3 Encounters:  10/26/15 420 lb 8 oz (190.738 kg)  11/24/14 419 lb (190.057 kg)  04/28/14 360 lb (163.295 kg)  Physical Exam  Constitutional:  Non-toxic appearance. He does not have a sickly appearance. He appears ill. No distress.  Morbidly obese  HENT:  Mouth/Throat: Oropharynx is clear and moist. No oropharyngeal exudate.  Eyes: Conjunctivae are normal. Right eye  exhibits no discharge. Left eye exhibits no discharge. No scleral icterus.  Neck: Normal range of motion. Neck supple. No JVD present. No tracheal deviation present. No thyromegaly present.  Cardiovascular: Normal rate, regular rhythm, normal heart sounds and intact distal pulses.  Exam reveals no gallop and no friction rub.   No murmur heard. He refused to have an EKG done today  Pulmonary/Chest: Effort normal and breath sounds normal. No stridor. No respiratory distress. He has no wheezes. He has no rales. He exhibits no tenderness.  Abdominal: Soft. Bowel sounds are normal. He exhibits no distension and no mass. There is no tenderness. There is no rebound and no guarding.  Musculoskeletal: Normal range of motion. He exhibits no edema or tenderness.  Lymphadenopathy:    He has no cervical adenopathy.  Skin: Skin is warm and dry. No rash noted. He is not diaphoretic. No erythema. No pallor.  Psychiatric: He has a normal mood and affect. His behavior is normal. Judgment and thought content normal.  Vitals reviewed.   Lab Results  Component Value Date   WBC 8.9 11/24/2014   HGB 14.5 11/24/2014   HCT 43.2 11/24/2014   PLT 287.0 11/24/2014   GLUCOSE 162* 10/26/2015   CHOL 145 11/19/2013   TRIG 45.0 11/19/2013   HDL 49.20 11/19/2013   LDLCALC 87 11/19/2013   ALT 25 11/24/2014   AST 30 11/24/2014   NA 137 10/26/2015   K 4.1 10/26/2015   CL 96 10/26/2015   CREATININE 0.74 10/26/2015   BUN 11 10/26/2015   CO2 34* 10/26/2015   TSH 4.30 10/26/2015   HGBA1C 8.2* 10/26/2015    Dg Chest 2 View  12/08/2014  CLINICAL DATA:  Cough and shortness of breath. EXAM: CHEST  2 VIEW COMPARISON:  Chest radiograph November 24, 2014 and chest CT November 25, 2014 FINDINGS: There is slight bibasilar atelectatic change. Lungs elsewhere clear. Heart size and pulmonary vascularity are normal. No adenopathy. There is degenerative change in the thoracic spine diffuse idiopathic skeletal hyperostosis. IMPRESSION:  Mild bibasilar atelectasis.  No frank edema or consolidation. Electronically Signed   By: Lowella Grip M.D.   On: 12/08/2014 08:51    Assessment & Plan:   Zachary Murray was seen today for hypothyroidism, hypertension and diarrhea.  Diagnoses and all orders for this visit:  Need for prophylactic vaccination and inoculation against influenza -     Flu Vaccine QUAD 36+ mos PF IM (Fluarix & Fluzone Quad PF)  Essential hypertension, benign- his blood pressure is not adequately well controlled, his labs do not show any secondary metabolic causes for hypertension, I have asked him to have his sleep apnea evaluated and treated. If his blood pressure does not come under control within the next few months and will consider adding an ARB. -     Basic metabolic panel; Future  TSH elevation- his TSH is normal now, will monitor his other TFTs to see if there is a significant degree of thyroid disease to explain his symptoms. -     TSH; Future -     T3, free; Future -     T4; Future -     Thyroid peroxidase antibody; Future  Hyperglycemia- his A1c is up to 8.2%, will treat for diabetes. -  Hemoglobin A1c; Future -     Basic metabolic panel; Future  OSA (obstructive sleep apnea)- I've asked him to have his sleep apnea reevaluated and treated. -     Ambulatory referral to Pulmonology  Obesity, Class III, BMI 40-49.9 (morbid obesity) ([place])- I've asked him to consider bariatric surgery and is made a referral to that and. -     Ambulatory referral to General Surgery  Diarrhea, unspecified type- he only has one loose bowel movement a day, will check his stool for yeast at his request, will also check for lactoferrin to see if there is concern for infection and will screen him for Clostridium difficile infection. Will also check a celiac panel to see his see if he has gluten sensitive enteropathy. -     Gliadin antibodies, serum -     Tissue transglutaminase, IgA -     Reticulin Antibody, IgA w reflex  titer -     Clostridium difficile EIA; Future -     Stool culture; Future -     Fecal lactoferrin; Future  Type 2 diabetes mellitus with complication, without long-term current use of insulin ([place])- he was referred for an eye exam, will start treating this with a combination of metformin and a DPP 4 inhibitor. He agrees to work on his lifestyle modifications as well. -     Ambulatory referral to Ophthalmology -     Saxagliptin-Metformin (KOMBIGLYZE XR) 2.03-999 MG TB24; Take 2 tablets by mouth daily.   I have discontinued Mr. Penta Misc Natural Products (GLUCOSAMINE CHONDROITIN ADV PO), 5-Hydroxytryptophan, UNABLE TO FIND, HYDROcodone-acetaminophen, levothyroxine, and Fluticasone Furoate-Vilanterol. I am also having him start on Saxagliptin-Metformin. Additionally, I am having him maintain his Vitamin B-12, multivitamin with minerals, Vitamin D3, and triamterene-hydrochlorothiazide.  Meds ordered this encounter  Medications  . Saxagliptin-Metformin (KOMBIGLYZE XR) 2.03-999 MG TB24    Sig: Take 2 tablets by mouth daily.    Dispense:  180 tablet    Refill:  1     Follow-up: Return in about 3 weeks (around 11/16/2015).  Scarlette Calico, MD

## 2015-10-27 ENCOUNTER — Encounter: Payer: Self-pay | Admitting: Internal Medicine

## 2015-10-27 LAB — GLIADIN ANTIBODIES, SERUM
Gliadin IgA: 5 Units (ref <20)
Gliadin IgG: 2 Units (ref <20)

## 2015-10-27 LAB — TISSUE TRANSGLUTAMINASE, IGA: Tissue Transglutaminase Ab, IgA: 1 U/mL (ref <4)

## 2015-10-27 LAB — C. DIFFICILE GDH AND TOXIN A/B
C. difficile GDH: NOT DETECTED
C. difficile Toxin A/B: NOT DETECTED

## 2015-10-27 LAB — FECAL LACTOFERRIN, QUANT: Lactoferrin: NEGATIVE

## 2015-10-27 LAB — RETICULIN ANTIBODIES, IGA W TITER: Reticulin Ab, IgA: NEGATIVE

## 2015-10-27 LAB — T4: T4, Total: 9.5 ug/dL (ref 4.5–12.0)

## 2015-10-27 LAB — THYROID PEROXIDASE ANTIBODY: Thyroperoxidase Ab SerPl-aCnc: 1 IU/mL (ref <9)

## 2015-10-28 ENCOUNTER — Encounter: Payer: Self-pay | Admitting: Internal Medicine

## 2015-10-29 ENCOUNTER — Encounter: Payer: Self-pay | Admitting: Internal Medicine

## 2015-10-30 LAB — STOOL CULTURE

## 2015-11-02 ENCOUNTER — Encounter: Payer: Self-pay | Admitting: Internal Medicine

## 2015-11-09 ENCOUNTER — Encounter: Payer: Self-pay | Admitting: Internal Medicine

## 2015-11-09 LAB — HM DIABETES EYE EXAM

## 2015-11-10 ENCOUNTER — Encounter: Payer: Self-pay | Admitting: Internal Medicine

## 2015-12-23 ENCOUNTER — Other Ambulatory Visit: Payer: Self-pay | Admitting: Internal Medicine

## 2016-02-06 ENCOUNTER — Telehealth: Payer: Self-pay | Admitting: Internal Medicine

## 2016-02-06 ENCOUNTER — Encounter: Payer: Self-pay | Admitting: Internal Medicine

## 2016-02-06 ENCOUNTER — Ambulatory Visit (INDEPENDENT_AMBULATORY_CARE_PROVIDER_SITE_OTHER): Payer: BLUE CROSS/BLUE SHIELD | Admitting: Internal Medicine

## 2016-02-06 VITALS — BP 130/80 | HR 102 | Temp 98.8°F | Resp 20 | Wt >= 6400 oz

## 2016-02-06 DIAGNOSIS — L0201 Cutaneous abscess of face: Secondary | ICD-10-CM | POA: Diagnosis not present

## 2016-02-06 DIAGNOSIS — I10 Essential (primary) hypertension: Secondary | ICD-10-CM

## 2016-02-06 DIAGNOSIS — E118 Type 2 diabetes mellitus with unspecified complications: Secondary | ICD-10-CM

## 2016-02-06 MED ORDER — DOXYCYCLINE HYCLATE 100 MG PO TABS: 100.0000 mg | ORAL_TABLET | Freq: Two times a day (BID) | ORAL | Status: DC

## 2016-02-06 NOTE — Telephone Encounter (Signed)
Got patient scheduled with Dr. Jenny Reichmann for today

## 2016-02-06 NOTE — Progress Notes (Signed)
   Subjective:    Patient ID: Zachary Murray, male    DOB: 1966/03/18, 50 y.o.   MRN: [id number]  HPI  Here to f/u with acute, has 1 yr hx of cystic like mass to right face at about the max sinus area, then in past wk has suddently become red, swelling, markedly tender without drainage, fever but marked pain as well (though does not want pain meds) No high fever, chills  Pt denies chest pain, increased sob or doe, wheezing, orthopnea, PND, increased LE swelling, palpitations, dizziness or syncope.  Pt denies new neurological symptoms such as new headache, or facial or extremity weakness or numbness   Pt denies polydipsia, polyuria. Past Medical History  Diagnosis Date  . Hypertension   . OSA on CPAP    Past Surgical History  Procedure Laterality Date  . Wisdom tooth extraction      reports that he has never smoked. His smokeless tobacco use includes Chew. He reports that he does not drink alcohol or use illicit drugs. family history includes Colon cancer in his father; Hypertension in his father; Thyroid cancer in his mother. There is no history of Alcohol abuse, COPD, Diabetes, Early death, Hearing loss, Heart disease, Hyperlipidemia, Kidney disease, or Stroke. Allergies  Allergen Reactions  . Lisinopril Cough   Current Outpatient Prescriptions on File Prior to Visit  Medication Sig Dispense Refill  . Cholecalciferol (VITAMIN D3) 1000 UNITS CAPS Take 1 capsule by mouth daily.    . Cyanocobalamin (VITAMIN B-12) 5000 MCG SUBL Place 1 tablet under the tongue daily.    . Multiple Vitamins-Minerals (MULTIVITAMIN WITH MINERALS) tablet Take 1 tablet by mouth daily.    . Saxagliptin-Metformin (KOMBIGLYZE XR) 2.03-999 MG TB24 Take 2 tablets by mouth daily. 180 tablet 1  . triamterene-hydrochlorothiazide (MAXZIDE-25) 37.5-25 MG per tablet Take 1 tablet by mouth daily. 90 tablet 0  . triamterene-hydrochlorothiazide (MAXZIDE-25) 37.5-25 MG tablet TAKE ONE TABLET BY MOUTH ONCE DAILY 90 tablet 3   No  current facility-administered medications on file prior to visit.     Review of Systems  Constitutional: Negative for unusual diaphoresis or night sweats HENT: Negative for ringing in ear or discharge Eyes: Negative for double vision or worsening visual disturbance.  Respiratory: Negative for choking and stridor.   Gastrointestinal: Negative for vomiting or other signifcant bowel change Genitourinary: Negative for hematuria or change in urine volume.  Musculoskeletal: Negative for other MSK pain or swelling Skin: Negative for color change and worsening wound.  Neurological: Negative for tremors and numbness other than noted  Psychiatric/Behavioral: Negative for decreased concentration or agitation other than above       Objective:   Physical Exam BP 130/80 mmHg  Pulse 102  Temp(Src) 98.8 F (37.1 C) (Oral)  Resp 20  Wt 410 lb (185.975 kg)  SpO2 95% VS noted,  Constitutional: Pt appears in no significant distress HENT: Head: NCAT.  Right Ear: External ear normal.  Left Ear: External ear normal.  Eyes: . Pupils are equal, round, and reactive to light. Conjunctivae and EOM are normal Neck: Normal range of motion. Neck supple.  Cardiovascular: Normal rate and regular rhythm.   Pulmonary/Chest: Effort normal and breath sounds without rales or wheezing.  Neurological: Pt is alert. Not confused , motor grossly intact Skin: Skin is warm. No rash, no LE edema, has > 1 cm red/tender/sweling area over right max sinus area, + fluctuant Psychiatric: Pt behavior is normal. No agitation.     Assessment & Plan:

## 2016-02-06 NOTE — Progress Notes (Signed)
Pre visit review using our clinic review tool, if applicable. No additional management support is needed unless otherwise documented below in the visit note. 

## 2016-02-06 NOTE — Patient Instructions (Signed)
Please take all new medication as prescribed - the antibiotic  You will be contacted regarding the referral for: ENT asap  Please continue all other medications as before, and refills have been done if requested.  Please have the pharmacy call with any other refills you may need.  Please keep your appointments with your specialists as you may have planned

## 2016-02-06 NOTE — Assessment & Plan Note (Signed)
Mild to mod, for antibx course,  to f/u any worsening symptoms or concerns 

## 2016-02-06 NOTE — Assessment & Plan Note (Signed)
Mild uncontrolled recently, o/w stable overall by history and exam, recent data reviewed with pt, and pt to continue medical treatment as before with attention to better diet and activity, f/u with PCP,  to f/u any worsening symptoms or concerns Lab Results  Component Value Date   HGBA1C 8.2* 10/26/2015

## 2016-02-06 NOTE — Telephone Encounter (Signed)
It could take weeks or months to see a derm This needs to be checked today

## 2016-02-06 NOTE — Assessment & Plan Note (Signed)
stable overall by history and exam, recent data reviewed with pt, and pt to continue medical treatment as before,  to f/u any worsening symptoms or concerns  BP Readings from Last 3 Encounters:  02/06/16 130/80  10/26/15 136/90  12/08/14 110/70

## 2016-02-06 NOTE — Telephone Encounter (Signed)
Patient is requesting urgent referral to a dermatologist for a spot on his face that is red and infected.

## 2016-02-08 ENCOUNTER — Ambulatory Visit (INDEPENDENT_AMBULATORY_CARE_PROVIDER_SITE_OTHER): Payer: BLUE CROSS/BLUE SHIELD | Admitting: Internal Medicine

## 2016-02-08 ENCOUNTER — Other Ambulatory Visit (INDEPENDENT_AMBULATORY_CARE_PROVIDER_SITE_OTHER): Payer: BLUE CROSS/BLUE SHIELD

## 2016-02-08 ENCOUNTER — Encounter: Payer: Self-pay | Admitting: Internal Medicine

## 2016-02-08 VITALS — BP 120/82 | HR 95 | Temp 97.9°F | Resp 16 | Ht 72.0 in | Wt >= 6400 oz

## 2016-02-08 DIAGNOSIS — I10 Essential (primary) hypertension: Secondary | ICD-10-CM

## 2016-02-08 DIAGNOSIS — E785 Hyperlipidemia, unspecified: Secondary | ICD-10-CM | POA: Diagnosis not present

## 2016-02-08 DIAGNOSIS — R7989 Other specified abnormal findings of blood chemistry: Secondary | ICD-10-CM

## 2016-02-08 DIAGNOSIS — R946 Abnormal results of thyroid function studies: Secondary | ICD-10-CM

## 2016-02-08 DIAGNOSIS — L0201 Cutaneous abscess of face: Secondary | ICD-10-CM

## 2016-02-08 DIAGNOSIS — M79604 Pain in right leg: Secondary | ICD-10-CM

## 2016-02-08 DIAGNOSIS — E038 Other specified hypothyroidism: Secondary | ICD-10-CM | POA: Diagnosis not present

## 2016-02-08 DIAGNOSIS — E118 Type 2 diabetes mellitus with unspecified complications: Secondary | ICD-10-CM

## 2016-02-08 DIAGNOSIS — M79605 Pain in left leg: Secondary | ICD-10-CM

## 2016-02-08 LAB — MICROALBUMIN / CREATININE URINE RATIO
Creatinine,U: 130.4 mg/dL
Microalb Creat Ratio: 2.4 mg/g (ref 0.0–30.0)
Microalb, Ur: 3.1 mg/dL — ABNORMAL HIGH (ref 0.0–1.9)

## 2016-02-08 LAB — URINALYSIS, ROUTINE W REFLEX MICROSCOPIC
Bilirubin Urine: NEGATIVE
Hgb urine dipstick: NEGATIVE
Leukocytes, UA: NEGATIVE
Nitrite: NEGATIVE
RBC / HPF: NONE SEEN (ref 0–?)
Specific Gravity, Urine: 1.01 (ref 1.000–1.030)
Total Protein, Urine: NEGATIVE
Urine Glucose: NEGATIVE
Urobilinogen, UA: 2 — AB (ref 0.0–1.0)
WBC, UA: NONE SEEN (ref 0–?)
pH: 7 (ref 5.0–8.0)

## 2016-02-08 LAB — BASIC METABOLIC PANEL
BUN: 13 mg/dL (ref 6–23)
CO2: 34 mEq/L — ABNORMAL HIGH (ref 19–32)
Calcium: 10.4 mg/dL (ref 8.4–10.5)
Chloride: 99 mEq/L (ref 96–112)
Creatinine, Ser: 0.85 mg/dL (ref 0.40–1.50)
GFR: 100.53 mL/min (ref >60.00)
Glucose, Bld: 116 mg/dL — ABNORMAL HIGH (ref 70–99)
Potassium: 4.9 mEq/L (ref 3.5–5.1)
Sodium: 141 mEq/L (ref 135–145)

## 2016-02-08 LAB — CBC WITH DIFFERENTIAL/PLATELET
Basophils Absolute: 0 10*3/uL (ref 0.0–0.1)
Basophils Relative: 0.1 % (ref 0.0–3.0)
Eosinophils Absolute: 0.2 10*3/uL (ref 0.0–0.7)
Eosinophils Relative: 2.9 % (ref 0.0–5.0)
HCT: 44.7 % (ref 39.0–52.0)
Hemoglobin: 15.2 g/dL (ref 13.0–17.0)
Lymphocytes Relative: 17.5 % (ref 12.0–46.0)
Lymphs Abs: 1.3 10*3/uL (ref 0.7–4.0)
MCHC: 34 g/dL (ref 30.0–36.0)
MCV: 85.9 fl (ref 78.0–100.0)
Monocytes Absolute: 0.8 10*3/uL (ref 0.1–1.0)
Monocytes Relative: 11.1 % (ref 3.0–12.0)
Neutro Abs: 5 10*3/uL (ref 1.4–7.7)
Neutrophils Relative %: 68.4 % (ref 43.0–77.0)
Platelets: 258 10*3/uL (ref 150.0–400.0)
RBC: 5.21 Mil/uL (ref 4.22–5.81)
RDW: 13.6 % (ref 11.5–15.5)
WBC: 7.3 10*3/uL (ref 4.0–10.5)

## 2016-02-08 LAB — T4, FREE: Free T4: 0.97 ng/dL (ref 0.60–1.60)

## 2016-02-08 LAB — LIPID PANEL
Cholesterol: 130 mg/dL (ref 0–200)
HDL: 51.2 mg/dL (ref >39.00)
LDL Cholesterol: 68 mg/dL (ref 0–99)
NonHDL: 78.96
Total CHOL/HDL Ratio: 3
Triglycerides: 54 mg/dL (ref 0.0–149.0)
VLDL: 10.8 mg/dL (ref 0.0–40.0)

## 2016-02-08 LAB — TSH: TSH: 4.02 u[IU]/mL (ref 0.35–4.50)

## 2016-02-08 LAB — HEMOGLOBIN A1C: Hgb A1c MFr Bld: 7.1 % — ABNORMAL HIGH (ref 4.6–6.5)

## 2016-02-08 LAB — CK: Total CK: 131 U/L (ref 7–232)

## 2016-02-08 MED ORDER — SAXAGLIPTIN-METFORMIN ER 2.5-1000 MG PO TB24: 2.0000 | ORAL_TABLET | Freq: Every day | ORAL | Status: DC

## 2016-02-08 MED ORDER — MELOXICAM 15 MG PO TABS: 15.0000 mg | ORAL_TABLET | Freq: Every day | ORAL | Status: DC

## 2016-02-08 MED ORDER — TRIAMTERENE-HCTZ 37.5-25 MG PO TABS: 1.0000 | ORAL_TABLET | Freq: Every day | ORAL | Status: DC

## 2016-02-08 MED ORDER — DOXYCYCLINE HYCLATE 100 MG PO TABS: 100.0000 mg | ORAL_TABLET | Freq: Two times a day (BID) | ORAL | Status: AC

## 2016-02-08 NOTE — Patient Instructions (Signed)
Incision and Drainage Incision and drainage is a procedure in which a sac-like structure (cystic structure) is opened and drained. The area to be drained usually contains material such as pus, fluid, or blood.  LET YOUR CAREGIVER KNOW ABOUT:   Allergies to medicine.  Medicines taken, including vitamins, herbs, eyedrops, over-the-counter medicines, and creams.  Use of steroids (by mouth or creams).  Previous problems with anesthetics or numbing medicines.  History of bleeding problems or blood clots.  Previous surgery.  Other health problems, including diabetes and kidney problems.  Possibility of pregnancy, if this applies. RISKS AND COMPLICATIONS  Pain.  Bleeding.  Scarring.  Infection. BEFORE THE PROCEDURE  You may need to have an ultrasound or other imaging tests to see how large or deep your cystic structure is. Blood tests may also be used to determine if you have an infection or how severe the infection is. You may need to have a tetanus shot. PROCEDURE  The affected area is cleaned with a cleaning fluid. The cyst area will then be numbed with a medicine (local anesthetic). A small incision will be made in the cystic structure. A syringe or catheter may be used to drain the contents of the cystic structure, or the contents may be squeezed out. The area will then be flushed with a cleansing solution. After cleansing the area, it is often gently packed with a gauze or another wound dressing. Once it is packed, it will be covered with gauze and tape or some other type of wound dressing. AFTER THE PROCEDURE   Often, you will be allowed to go home right after the procedure.  You may be given antibiotic medicine to prevent or heal an infection.  If the area was packed with gauze or some other wound dressing, you will likely need to come back in 1 to 2 days to get it removed.  The area should heal in about 14 days.   This information is not intended to replace advice given  to you by your health care provider. Make sure you discuss any questions you have with your health care provider.   Document Released: 04/30/2001 Document Revised: 05/05/2012 Document Reviewed: 12/30/2011 Elsevier Interactive Patient Education 2016 [employer] Inc[employer]  

## 2016-02-08 NOTE — Progress Notes (Signed)
Pre visit review using our clinic review tool, if applicable. No additional management support is needed unless otherwise documented below in the visit note. 

## 2016-02-08 NOTE — Progress Notes (Signed)
Subjective:  Patient ID: Zachary Murray, male    DOB: January 21, 1966  Age: 50 y.o. MRN: [id number]  CC: Cellulitis; Hypertension; Hypothyroidism; and Diabetes  Concerned about a cyst on the right side of his face. He says it has been there for about a year but has not bothered him until the last week or 2. It has become red and swollen. It occasionally drains a bloody/thick pus. He was seen here 2 days ago and the cyst was not incised or drained but it was recommended that he start taking doxycycline. He didn't start the doxycycline instead later that same day he went to see an ENT doctor who he describes put a needle in it and drained something out. That ENT doctor recommended that he start taking Keflex which he has been taking. He was told that the cyst cannot be drained until the infection is treated. He is no better and complains of pain, redness, swelling in the area of his right face.  [name] presents for f/up on DM2. He tells me that his blood sugars have been well controlled.  He also today complains of diffuse lower extremity pain. The pain is very vague and widespread but he also localizes it to his thighs and his knees. He has not recently taken anything for pain. He saw sports medicine in the last year and was told that he has tight hamstrings. He requests something for the pain and a follow-up with sports medicine.  Outpatient Prescriptions Prior to Visit  Medication Sig Dispense Refill  . Cholecalciferol (VITAMIN D3) 1000 UNITS CAPS Take 1 capsule by mouth daily.    . Saxagliptin-Metformin (KOMBIGLYZE XR) 2.03-999 MG TB24 Take 2 tablets by mouth daily. 180 tablet 1  . Cyanocobalamin (VITAMIN B-12) 5000 MCG SUBL Place 1 tablet under the tongue daily. Reported on 02/08/2016    . Multiple Vitamins-Minerals (MULTIVITAMIN WITH MINERALS) tablet Take 1 tablet by mouth daily. Reported on 02/08/2016    . doxycycline (VIBRA-TABS) 100 MG tablet Take 1 tablet (100 mg total) by mouth 2 (two)  times daily. (Patient not taking: Reported on 02/08/2016) 20 tablet 0  . triamterene-hydrochlorothiazide (MAXZIDE-25) 37.5-25 MG per tablet Take 1 tablet by mouth daily. (Patient not taking: Reported on 02/08/2016) 90 tablet 0  . triamterene-hydrochlorothiazide (MAXZIDE-25) 37.5-25 MG tablet TAKE ONE TABLET BY MOUTH ONCE DAILY 90 tablet 3   No facility-administered medications prior to visit.    ROS Review of Systems  Constitutional: Negative.  Negative for fever, chills, diaphoresis, appetite change and fatigue.  HENT: Negative.   Eyes: Negative.   Respiratory: Negative.  Negative for cough, choking, chest tightness, shortness of breath and stridor.   Cardiovascular: Negative.  Negative for chest pain, palpitations and leg swelling.  Gastrointestinal: Negative.  Negative for nausea, vomiting, abdominal pain, diarrhea, constipation and blood in stool.  Endocrine: Negative.  Negative for polydipsia, polyphagia and polyuria.  Genitourinary: Negative.  Negative for dysuria and difficulty urinating.  Musculoskeletal: Positive for myalgias and arthralgias. Negative for back pain, joint swelling, gait problem, neck pain and neck stiffness.  Skin: Negative.  Negative for color change and rash.  Allergic/Immunologic: Negative.   Neurological: Negative.  Negative for dizziness, tremors, syncope, speech difficulty, light-headedness, numbness and headaches.  Hematological: Negative.  Negative for adenopathy. Does not bruise/bleed easily.  Psychiatric/Behavioral: Negative.     Objective:  BP 120/82 mmHg  Pulse 95  Temp(Src) 97.9 F (36.6 C) (Oral)  Resp 16  Ht 6' (1.829 m)  Wt 404  lb 8 oz (183.48 kg)  BMI 54.85 kg/m2  SpO2 98%  BP Readings from Last 3 Encounters:  02/08/16 120/82  02/06/16 130/80  10/26/15 136/90    Wt Readings from Last 3 Encounters:  02/08/16 404 lb 8 oz (183.48 kg)  02/06/16 410 lb (185.975 kg)  10/26/15 420 lb 8 oz (190.738 kg)    Physical Exam  Constitutional:  He is oriented to person, place, and time. No distress.  HENT:  Head:    Mouth/Throat: Oropharynx is clear and moist. No oropharyngeal exudate.  The area was cleaned with Betadine and prepped and draped in sterile fashion. Local anesthesia was obtained with instillation of 2% lidocaine with epinephrine, 1.5 mL's were used. Next a 3 mm punch incision was made and a small, loculated cavity was identified. This was explored and several loculations were released. A scant amount of clear exudate was sent for culture. There was no pus. Small components of a cystic lesion were removed as well. The cavity was packed with iodoform and a dressing was applied. He tolerated this well.  Eyes: Conjunctivae are normal. Right eye exhibits no discharge. Left eye exhibits no discharge. No scleral icterus.  Neck: Normal range of motion. Neck supple. No JVD present. No tracheal deviation present. No thyromegaly present.  Cardiovascular: Normal rate, regular rhythm, normal heart sounds and intact distal pulses.  Exam reveals no gallop and no friction rub.   No murmur heard. Pulses:      Carotid pulses are 1+ on the right side, and 1+ on the left side.      Radial pulses are 1+ on the right side, and 1+ on the left side.       Femoral pulses are 1+ on the right side, and 1+ on the left side.      Popliteal pulses are 1+ on the right side, and 1+ on the left side.       Dorsalis pedis pulses are 1+ on the right side, and 1+ on the left side.       Posterior tibial pulses are 1+ on the right side, and 1+ on the left side.  Pulmonary/Chest: Effort normal and breath sounds normal. No stridor. No respiratory distress. He has no wheezes. He has no rales. He exhibits no tenderness.  Abdominal: Soft. Bowel sounds are normal. He exhibits no distension and no mass. There is no tenderness. There is no rebound and no guarding.  Musculoskeletal: Normal range of motion. He exhibits no edema or tenderness.  Lymphadenopathy:    He  has no cervical adenopathy.  Neurological: He is alert and oriented to person, place, and time. He has normal reflexes. He displays normal reflexes. No cranial nerve deficit. He exhibits normal muscle tone. Coordination normal.  Skin: Skin is warm and dry. No rash noted. He is not diaphoretic. No erythema. No pallor.  Psychiatric: He has a normal mood and affect. His behavior is normal. Judgment and thought content normal.    Lab Results  Component Value Date   WBC 7.3 02/08/2016   HGB 15.2 02/08/2016   HCT 44.7 02/08/2016   PLT 258.0 02/08/2016   GLUCOSE 116* 02/08/2016   CHOL 130 02/08/2016   TRIG 54.0 02/08/2016   HDL 51.20 02/08/2016   LDLCALC 68 02/08/2016   ALT 25 11/24/2014   AST 30 11/24/2014   NA 141 02/08/2016   K 4.9 02/08/2016   CL 99 02/08/2016   CREATININE 0.85 02/08/2016   BUN 13 02/08/2016   CO2 34*  02/08/2016   TSH 4.02 02/08/2016   HGBA1C 7.1* 02/08/2016   MICROALBUR 3.1* 02/08/2016    Dg Chest 2 View  12/08/2014  CLINICAL DATA:  Cough and shortness of breath. EXAM: CHEST  2 VIEW COMPARISON:  Chest radiograph November 24, 2014 and chest CT November 25, 2014 FINDINGS: There is slight bibasilar atelectatic change. Lungs elsewhere clear. Heart size and pulmonary vascularity are normal. No adenopathy. There is degenerative change in the thoracic spine diffuse idiopathic skeletal hyperostosis. IMPRESSION: Mild bibasilar atelectasis.  No frank edema or consolidation. Electronically Signed   By: Lowella Grip M.D.   On: 12/08/2014 08:51    Assessment & Plan:   Zachary Murray was seen today for cellulitis, hypertension, hypothyroidism and diabetes.  Diagnoses and all orders for this visit:  Essential hypertension, benign- His blood pressures well controlled, electrolytes and renal function are stable. -     CBC with Differential/Platelet; Future -     Basic metabolic panel; Future -     Urinalysis, Routine w reflex microscopic (not at [facility]); Future -      triamterene-hydrochlorothiazide (MAXZIDE-25) 37.5-25 MG tablet; Take 1 tablet by mouth daily.  Other specified hypothyroidism- his TSH is in the normal range now -     TSH; Future -     T4, free; Future  Type 2 diabetes mellitus with complication, without long-term current use of insulin ([place])- his blood sugars are well-controlled, will continue the current regimen for treatment of diabetes mellitus type 2. -     Basic metabolic panel; Future -     Hemoglobin A1c; Future -     Microalbumin / creatinine urine ratio; Future -     Saxagliptin-Metformin (KOMBIGLYZE XR) 2.03-999 MG TB24; Take 2 tablets by mouth daily.  TSH elevation -     TSH; Future -     T4, free; Future  Hyperlipidemia with target LDL less than 130- he is achieved his LDL goal is doing well on the statin. -     Lipid panel; Future -     CK; Future  Facial abscess- incision and drainage completed, I recommend that he start taking the doxycycline since I'm concerned this might be a staph infection, culture is pending-will change antibiotic regimen based on culture results. I've asked him to take out the packing in about 2 or 3 days and return here in 3-5 days for recheck of the area. -     Wound culture; Future -     doxycycline (VIBRA-TABS) 100 MG tablet; Take 1 tablet (100 mg total) by mouth 2 (two) times daily.  Leg pain, bilateral- we'll start an anti-inflammatory for this and asked him to follow-up with sports medicine. -     Ambulatory referral to Sports Medicine -     meloxicam (MOBIC) 15 MG tablet; Take 1 tablet (15 mg total) by mouth daily.  I have discontinued Mr. Genrich triamterene-hydrochlorothiazide and cephALEXin. I have also changed his triamterene-hydrochlorothiazide. Additionally, I am having him start on meloxicam. Lastly, I am having him maintain his Vitamin B-12, multivitamin with minerals, Vitamin D3, glucosamine-chondroitin, doxycycline, and Saxagliptin-Metformin.  Meds ordered this encounter    Medications  . glucosamine-chondroitin 500-400 MG tablet    Sig: Take 1 tablet by mouth 3 (three) times daily.  Marland Kitchen DISCONTD: cephALEXin (KEFLEX) 250 MG capsule    Sig: Take 250 mg by mouth 3 (three) times daily.  Marland Kitchen doxycycline (VIBRA-TABS) 100 MG tablet    Sig: Take 1 tablet (100 mg total) by mouth 2 (two)  times daily.    Dispense:  20 tablet    Refill:  0  . triamterene-hydrochlorothiazide (MAXZIDE-25) 37.5-25 MG tablet    Sig: Take 1 tablet by mouth daily.    Dispense:  90 tablet    Refill:  1  . Saxagliptin-Metformin (KOMBIGLYZE XR) 2.03-999 MG TB24    Sig: Take 2 tablets by mouth daily.    Dispense:  180 tablet    Refill:  1  . meloxicam (MOBIC) 15 MG tablet    Sig: Take 1 tablet (15 mg total) by mouth daily.    Dispense:  90 tablet    Refill:  1     Follow-up: Return in about 5 days (around 02/13/2016).  Scarlette Calico, MD

## 2016-02-10 ENCOUNTER — Encounter: Payer: Self-pay | Admitting: Internal Medicine

## 2016-02-11 LAB — WOUND CULTURE
Gram Stain: NONE SEEN
Gram Stain: NONE SEEN
Organism ID, Bacteria: NO GROWTH

## 2016-02-13 ENCOUNTER — Encounter: Payer: Self-pay | Admitting: Internal Medicine

## 2016-02-13 ENCOUNTER — Ambulatory Visit (INDEPENDENT_AMBULATORY_CARE_PROVIDER_SITE_OTHER): Payer: BLUE CROSS/BLUE SHIELD | Admitting: Internal Medicine

## 2016-02-13 VITALS — BP 116/82 | HR 89 | Temp 98.3°F | Resp 16 | Ht 72.0 in | Wt >= 6400 oz

## 2016-02-13 DIAGNOSIS — L0201 Cutaneous abscess of face: Secondary | ICD-10-CM | POA: Diagnosis not present

## 2016-02-13 DIAGNOSIS — E118 Type 2 diabetes mellitus with unspecified complications: Secondary | ICD-10-CM | POA: Diagnosis not present

## 2016-02-13 NOTE — Patient Instructions (Signed)

## 2016-02-13 NOTE — Progress Notes (Signed)
Pre visit review using our clinic review tool, if applicable. No additional management support is needed unless otherwise documented below in the visit note. 

## 2016-02-14 NOTE — Progress Notes (Signed)
Subjective:  Patient ID: Zachary Murray, male    DOB: 11/25/1965  Age: 50 y.o. MRN: [id number]  CC: Diabetes and Wound Check   HPI FENNER PROBUS presents for f/up - check right face wound s/p I and D. The area feels much better. The packing came out about 3 days ago and he tells me the area is no longer painful, red, swollen, or draining any fluid.  Outpatient Prescriptions Prior to Visit  Medication Sig Dispense Refill  . Cholecalciferol (VITAMIN D3) 1000 UNITS CAPS Take 1 capsule by mouth daily.    . Cyanocobalamin (VITAMIN B-12) 5000 MCG SUBL Place 1 tablet under the tongue daily. Reported on 02/08/2016    . doxycycline (VIBRA-TABS) 100 MG tablet Take 1 tablet (100 mg total) by mouth 2 (two) times daily. 20 tablet 0  . glucosamine-chondroitin 500-400 MG tablet Take 1 tablet by mouth 3 (three) times daily.    . meloxicam (MOBIC) 15 MG tablet Take 1 tablet (15 mg total) by mouth daily. 90 tablet 1  . Multiple Vitamins-Minerals (MULTIVITAMIN WITH MINERALS) tablet Take 1 tablet by mouth daily. Reported on 02/08/2016    . Saxagliptin-Metformin (KOMBIGLYZE XR) 2.03-999 MG TB24 Take 2 tablets by mouth daily. 180 tablet 1  . triamterene-hydrochlorothiazide (MAXZIDE-25) 37.5-25 MG tablet Take 1 tablet by mouth daily. 90 tablet 1   No facility-administered medications prior to visit.    ROS Review of Systems  Constitutional: Negative.  Negative for fever, chills and fatigue.  HENT: Negative.   Eyes: Negative.   Respiratory: Negative.  Negative for cough and shortness of breath.   Cardiovascular: Negative.  Negative for chest pain, palpitations and leg swelling.  Gastrointestinal: Negative.  Negative for abdominal pain.  Endocrine: Negative.   Genitourinary: Negative.   Musculoskeletal: Negative.   Skin: Positive for wound. Negative for color change.  Neurological: Negative.   Hematological: Negative.  Negative for adenopathy. Does not bruise/bleed easily.  Psychiatric/Behavioral: Negative.      Objective:  BP 116/82 mmHg  Pulse 89  Temp(Src) 98.3 F (36.8 C) (Oral)  Resp 16  Ht 6' (1.829 m)  Wt 403 lb (182.8 kg)  BMI 54.64 kg/m2  SpO2 95%  BP Readings from Last 3 Encounters:  02/13/16 116/82  02/08/16 120/82  02/06/16 130/80    Wt Readings from Last 3 Encounters:  02/13/16 403 lb (182.8 kg)  02/08/16 404 lb 8 oz (183.48 kg)  02/06/16 410 lb (185.975 kg)    Physical Exam  HENT:  Head:      Lab Results  Component Value Date   WBC 7.3 02/08/2016   HGB 15.2 02/08/2016   HCT 44.7 02/08/2016   PLT 258.0 02/08/2016   GLUCOSE 116* 02/08/2016   CHOL 130 02/08/2016   TRIG 54.0 02/08/2016   HDL 51.20 02/08/2016   LDLCALC 68 02/08/2016   ALT 25 11/24/2014   AST 30 11/24/2014   NA 141 02/08/2016   K 4.9 02/08/2016   CL 99 02/08/2016   CREATININE 0.85 02/08/2016   BUN 13 02/08/2016   CO2 34* 02/08/2016   TSH 4.02 02/08/2016   HGBA1C 7.1* 02/08/2016   MICROALBUR 3.1* 02/08/2016    Dg Chest 2 View  12/08/2014  CLINICAL DATA:  Cough and shortness of breath. EXAM: CHEST  2 VIEW COMPARISON:  Chest radiograph November 24, 2014 and chest CT November 25, 2014 FINDINGS: There is slight bibasilar atelectatic change. Lungs elsewhere clear. Heart size and pulmonary vascularity are normal. No adenopathy. There is degenerative change in  the thoracic spine diffuse idiopathic skeletal hyperostosis. IMPRESSION: Mild bibasilar atelectasis.  No frank edema or consolidation. Electronically Signed   By: Lowella Grip M.D.   On: 12/08/2014 08:51    Assessment & Plan:   Mardarius was seen today for diabetes and wound check.  Diagnoses and all orders for this visit:  Facial abscess- marked improvement noted status post incision and drainage, the culture was negative, he has completed his antibiotic regimen, there is a faint area of squishiness, if a cyst reappears I will refer him to plastic surgery to consider excision of the cyst but for now it appears this area has healed  without complications.  I am having Mr. Dolph maintain his Vitamin B-12, multivitamin with minerals, Vitamin D3, glucosamine-chondroitin, doxycycline, triamterene-hydrochlorothiazide, Saxagliptin-Metformin, and meloxicam.  No orders of the defined types were placed in this encounter.     Follow-up: Return in about 4 months (around 06/14/2016).  Scarlette Calico, MD

## 2016-02-28 DIAGNOSIS — G4733 Obstructive sleep apnea (adult) (pediatric): Secondary | ICD-10-CM | POA: Diagnosis not present

## 2016-03-05 ENCOUNTER — Encounter: Payer: Self-pay | Admitting: Family Medicine

## 2016-03-05 ENCOUNTER — Ambulatory Visit (INDEPENDENT_AMBULATORY_CARE_PROVIDER_SITE_OTHER): Payer: BLUE CROSS/BLUE SHIELD | Admitting: Family Medicine

## 2016-03-05 VITALS — BP 122/70 | HR 105 | Ht 72.0 in | Wt >= 6400 oz

## 2016-03-05 DIAGNOSIS — M79604 Pain in right leg: Secondary | ICD-10-CM | POA: Diagnosis not present

## 2016-03-05 DIAGNOSIS — M79605 Pain in left leg: Secondary | ICD-10-CM

## 2016-03-05 MED ORDER — VITAMIN D (ERGOCALCIFEROL) 1.25 MG (50000 UNIT) PO CAPS: 50000.0000 [IU] | ORAL_CAPSULE | ORAL | Status: DC

## 2016-03-05 MED ORDER — GABAPENTIN 100 MG PO CAPS: 200.0000 mg | ORAL_CAPSULE | Freq: Every day | ORAL | Status: DC

## 2016-03-05 NOTE — Assessment & Plan Note (Signed)
Encourage weight loss. 

## 2016-03-05 NOTE — Patient Instructions (Signed)
Good to see you.  Once weekly vitamin D  Gabapentin 100mg  at night for first week then 200mg  thereafter nightly Turmeric 500mg  twice daily  Come slowly off the meloxicam over the next 2 weeks. If pain worsens go back on it Good shoes with rigid bottom.  Jalene Mullet, [place] or New balance greater then 700 See me again in 3 weeks to make sure you are doing better.

## 2016-03-05 NOTE — Progress Notes (Signed)
[name] [address], [place] [address] Phone: [phone] Subjective:    I'm seeing this patient by the request  of:  Scarlette Calico, MD   CC: left leg pain   [id number] Zachary Murray is a 50 y.o. male coming in with complaint of left leg pain.  Patient has had this pain for greater than a year. Has been worked up for it including lumbar x-rays that were independently visualized by me showing very normal unremarkable for any bony or modalities. Patient states that it seems to be only when he is standing for a long amount of time. Seems more on the left leg than the right leg. Seems to go down the posterior aspect near its hamstring and tingling to his calf. States that recently the left leg is had some weakness associated with it as well. States that the pain has improved though with meloxicam recently. Did not help any of the weakness. States that he has good strength initially but then was standing and seems to get weaker. Rates the severity of discomfort as 5 out of 10. Denies any nighttime awakening.     Past Medical History  Diagnosis Date  . Hypertension   . OSA on CPAP    Past Surgical History  Procedure Laterality Date  . Wisdom tooth extraction     Social History   Social History  . Marital Status: Single    Spouse Name: N/A  . Number of Children: N/A  . Years of Education: N/A   Occupational History  . self employeed--- office manager    Social History Main Topics  . Smoking status: Never Smoker   . Smokeless tobacco: Current User    Types: Chew  . Alcohol Use: No  . Drug Use: No  . Sexual Activity: Not Currently   Other Topics Concern  . None   Social History Narrative   Allergies  Allergen Reactions  . Lisinopril Cough   Family History  Problem Relation Age of Onset  . Colon cancer Father   . Hypertension Father   . Alcohol abuse Neg Hx   . COPD Neg Hx   . Diabetes Neg Hx   . Early death Neg Hx   .  Hearing loss Neg Hx   . Heart disease Neg Hx   . Hyperlipidemia Neg Hx   . Kidney disease Neg Hx   . Stroke Neg Hx   . Thyroid cancer Mother     Past medical history, social, surgical and family history all reviewed in electronic medical record.  No pertanent information unless stated regarding to the chief complaint.   Review of Systems: No headache, visual changes, nausea, vomiting, diarrhea, constipation, dizziness, abdominal pain, skin rash, fevers, chills, night sweats, weight loss, swollen lymph nodes, body aches, joint swelling, muscle aches, chest pain, shortness of breath, mood changes.   Objective Blood pressure 122/70, pulse 105, height 6' (1.829 m), weight 405 lb (183.707 kg), SpO2 94 %.  General: No apparent distress alert and oriented x3 mood and affect normal, dressed appropriately. Morbidly obese HEENT: Pupils equal, extraocular movements intact  Respiratory: Patient's speak in full sentences and does not appear short of breath  Cardiovascular: 1+lower extremity edema, non tender, no erythema  Skin: Warm dry intact with no signs of infection or rash on extremities or on axial skeleton.  Abdomen: Soft nontender difficult to assess secondary to body habitus Neuro: Cranial nerves II through XII are intact, neurovascularly intact in  all extremities with 2+ DTRs and 2+ pulses.  Lymph: No lymphadenopathy of posterior or anterior cervical chain or axillae bilaterally.  Gait normal with good balance and coordination.  MSK:  Non tender with full range of motion and good stability and symmetric strength and tone of shoulders, elbows, wrist, hip, knee and ankles bilaterally. All difficult to assess secondary to patient's body habitus Back Exam:  Inspection: Unremarkable  Motion: Flexion 25 deg, Extension 15 deg, Side Bending to 35 deg bilaterally,  Rotation to 35 deg bilaterally  SLR laying: Negative  XSLR laying: Negative  Palpable tenderness: mild tenderness to palpation in the  paraspinal musculature FABER: negative. Sensory change: Gross sensation intact to all lumbar and sacral dermatomes.  Reflexes: 2+ at both patellar tendons, 2+ at achilles tendons, Babinski's downgoing.  Strength at foot  Plantar-flexion: 5/5 Dorsi-flexion: 5/5 Eversion: 5/5 Inversion: 5/5  Leg strength  4-5 but symmetric     Impression and Recommendations:     This case required medical decision making of moderate complexity.      Note: This dictation was prepared with Dragon dictation along with smaller phrase technology. Any transcriptional errors that result from this process are unintentional.

## 2016-03-05 NOTE — Progress Notes (Signed)
Pre visit review using our clinic review tool, if applicable. No additional management support is needed unless otherwise documented below in the visit note. 

## 2016-03-05 NOTE — Assessment & Plan Note (Signed)
I believe the patient's leg pain is likely secondary to his obesity. We did discuss differential including possible hypothyroidism but patient's TSH has been within the normal range recently, vitamin D deficiency which patient has not been checked but we will start supplementation. As well as possible spinal stenosis which patient has been worked up and x-rays have been unremarkable. At this juncture patient was given many different options and has elected try conservative therapy including vitamin D and gabapentin. We discussed some over-the-counter medications. We discussed and encouraged weight loss. Patient then is going come back and see me again in 3 weeks. If continuing to have difficulty consider either repeating x-rays of the lumbar spine or ordering a EMG for further evaluation for any neurologic component that could be contributig.

## 2016-03-29 DIAGNOSIS — G4733 Obstructive sleep apnea (adult) (pediatric): Secondary | ICD-10-CM | POA: Diagnosis not present

## 2016-04-02 ENCOUNTER — Ambulatory Visit: Payer: BLUE CROSS/BLUE SHIELD | Admitting: Internal Medicine

## 2016-04-02 ENCOUNTER — Ambulatory Visit (INDEPENDENT_AMBULATORY_CARE_PROVIDER_SITE_OTHER): Payer: BLUE CROSS/BLUE SHIELD | Admitting: Family Medicine

## 2016-04-02 ENCOUNTER — Encounter: Payer: Self-pay | Admitting: Family Medicine

## 2016-04-02 VITALS — BP 118/80 | HR 90 | Ht 72.0 in | Wt >= 6400 oz

## 2016-04-02 DIAGNOSIS — M79605 Pain in left leg: Secondary | ICD-10-CM

## 2016-04-02 DIAGNOSIS — M79604 Pain in right leg: Secondary | ICD-10-CM | POA: Diagnosis not present

## 2016-04-02 MED ORDER — NORTRIPTYLINE HCL 25 MG PO CAPS: 25.0000 mg | ORAL_CAPSULE | Freq: Every day | ORAL | Status: DC

## 2016-04-02 NOTE — Patient Instructions (Addendum)
Good to see you  Continue the vitamin D Stop the gabapentin  Start nortriptyline 25 mg at night, should be better tolerated then the gabapentin  Continue the turmeric Just read that Beet juice can help with some cramping and heart as well. If in pain try meloxicam daily for 3 days then as needed.   See me again in 3 weeks and if still in pain we will consider labs maybe

## 2016-04-02 NOTE — Assessment & Plan Note (Signed)
Still believe that this is likely mechanical secondary to patient's body habitus. Vitamin D deficiency could be contributing and will continue with the once weekly. We discussed nortriptyline instead of gabapentin in case this is a neurovascular compromise of some sort. Do not feel that further imaging of the back is warranted at this time with him not having any significant pain. ABI is ordered to further evaluate for any vascular compromise a could be contributing. Discussed shoes and encourage weight loss. Follow-up again in 3 weeks. Patient declined compression sleeves does give a family history of autoimmune diseases.  Spent  25 minutes with patient face-to-face and had greater than 50% of counseling including as described above in assessment and plan.

## 2016-04-02 NOTE — Progress Notes (Signed)
Pre visit review using our clinic review tool, if applicable. No additional management support is needed unless otherwise documented below in the visit note. 

## 2016-04-02 NOTE — Progress Notes (Signed)
[facility] [address] [place], [place] [address] Phone: [phone] Subjective:    I'm seeing this patient by the request  of:  Scarlette Calico, MD   CC: left leg pain f/u  [id number] MAXTYN AVENT is a 50 y.o. male coming in with complaint of left leg pain.  Patient has had this pain for greater than a year. Has been worked up for it including lumbar x-rays that were independently visualized by me showing very normal, unremarkable for any bony or modalities.  There was a concern that patient's body habitus could be contributing. Patient was also found to have severe vitamin D deficiency. Patient was to start supplementation. Patient states He is made minimal improvement. Patient states that the first and taken the gabapentin he was pain free unfortunate after that and has custom aggression and possibly even some depression. Patient is wondering if he can come off this medication. States that his pain is about the same but no worse. Denies any new symptoms such as numbness. Just a dull, throbbing aching sensation of the lower extremities and seems to be occurring on the right side as well.. .     Past Medical History  Diagnosis Date  . Hypertension   . OSA on CPAP    Past Surgical History  Procedure Laterality Date  . Wisdom tooth extraction     Social History   Social History  . Marital Status: Single    Spouse Name: N/A  . Number of Children: N/A  . Years of Education: N/A   Occupational History  . self employeed--- office manager    Social History Main Topics  . Smoking status: Never Smoker   . Smokeless tobacco: Current User    Types: Chew  . Alcohol Use: No  . Drug Use: No  . Sexual Activity: Not Currently   Other Topics Concern  . None   Social History Narrative   Allergies  Allergen Reactions  . Lisinopril Cough   Family History  Problem Relation Age of Onset  . Colon cancer Father   . Hypertension Father   . Alcohol  abuse Neg Hx   . COPD Neg Hx   . Diabetes Neg Hx   . Early death Neg Hx   . Hearing loss Neg Hx   . Heart disease Neg Hx   . Hyperlipidemia Neg Hx   . Kidney disease Neg Hx   . Stroke Neg Hx   . Thyroid cancer Mother     Past medical history, social, surgical and family history all reviewed in electronic medical record.  No pertanent information unless stated regarding to the chief complaint.   Review of Systems: No headache, visual changes, nausea, vomiting, diarrhea, constipation, dizziness, abdominal pain, skin rash, fevers, chills, night sweats, weight loss, swollen lymph nodes, body aches, joint swelling, muscle aches, chest pain, shortness of breath, mood changes.   Objective Blood pressure 118/80, pulse 90, height 6' (1.829 m), weight 404 lb (183.253 kg), SpO2 97 %.  General: No apparent distress alert and oriented x3 mood and affect normal, dressed appropriately. Morbidly obese HEENT: Pupils equal, extraocular movements intact  Respiratory: Patient's speak in full sentences and does not appear short of breath  Cardiovascular: 2+lower extremity edema, non tender, no erythema Some worsening swelling Skin: Warm dry intact with no signs of infection or rash on extremities or on axial skeleton.  Abdomen: Soft nontender difficult to assess secondary to body habitus Neuro: Cranial nerves II through  XII are intact, neurovascularly intact in all extremities with 2+ DTRs and 2+ pulses.  Lymph: No lymphadenopathy of posterior or anterior cervical chain or axillae bilaterally.  Gait normal with good balance and coordination.  MSK:  Non tender with full range of motion and good stability and symmetric strength and tone of shoulders, elbows, wrist, hip, knee and ankles bilaterally. All difficult to assess secondary to patient's body habitus Back Exam:  Inspection: Unremarkable  Motion: Flexion 25 deg, Extension 15 deg, Side Bending to 35 deg bilaterally,  Rotation to 35 deg bilaterally  SLR  laying: Negative  XSLR laying: Negative  Palpable tenderness: Nontender today and FABER: negative. Sensory change: Gross sensation intact to all lumbar and sacral dermatomes.  Reflexes: 2+ at both patellar tendons, 2+ at achilles tendons, Babinski's downgoing.  Strength at foot  Plantar-flexion: 5/5 Dorsi-flexion: 5/5 Eversion: 5/5 Inversion: 5/5  Leg strength  4-5 but symmetric  Lower extremities to have 2+ pitting edema with varicose veins noted of the lower extremity is bilaterally diffusely tender to palpation of the calves. Neurovascular intact distally.       Impression and Recommendations:     This case required medical decision making of moderate complexity.      Note: This dictation was prepared with Dragon dictation along with smaller phrase technology. Any transcriptional errors that result from this process are unintentional.

## 2016-04-08 ENCOUNTER — Other Ambulatory Visit: Payer: Self-pay | Admitting: Family Medicine

## 2016-04-08 DIAGNOSIS — M79605 Pain in left leg: Principal | ICD-10-CM

## 2016-04-08 DIAGNOSIS — M79604 Pain in right leg: Secondary | ICD-10-CM

## 2016-04-11 ENCOUNTER — Other Ambulatory Visit: Payer: Self-pay | Admitting: Family Medicine

## 2016-04-11 DIAGNOSIS — I739 Peripheral vascular disease, unspecified: Secondary | ICD-10-CM

## 2016-04-16 ENCOUNTER — Ambulatory Visit (HOSPITAL_COMMUNITY)
Admission: RE | Admit: 2016-04-16 | Discharge: 2016-04-16 | Disposition: A | Discharge status: Home / Self Care | Payer: BLUE CROSS/BLUE SHIELD | Source: Ambulatory Visit | Attending: Cardiology | Admitting: Cardiology

## 2016-04-16 DIAGNOSIS — G4733 Obstructive sleep apnea (adult) (pediatric): Secondary | ICD-10-CM | POA: Insufficient documentation

## 2016-04-16 DIAGNOSIS — I10 Essential (primary) hypertension: Secondary | ICD-10-CM | POA: Diagnosis not present

## 2016-04-16 DIAGNOSIS — I739 Peripheral vascular disease, unspecified: Secondary | ICD-10-CM | POA: Diagnosis not present

## 2016-04-24 ENCOUNTER — Ambulatory Visit (INDEPENDENT_AMBULATORY_CARE_PROVIDER_SITE_OTHER): Payer: BLUE CROSS/BLUE SHIELD | Admitting: Family Medicine

## 2016-04-24 ENCOUNTER — Encounter: Payer: Self-pay | Admitting: Family Medicine

## 2016-04-24 VITALS — BP 130/86 | HR 91 | Ht 72.0 in | Wt >= 6400 oz

## 2016-04-24 DIAGNOSIS — M79605 Pain in left leg: Secondary | ICD-10-CM | POA: Diagnosis not present

## 2016-04-24 DIAGNOSIS — M79604 Pain in right leg: Secondary | ICD-10-CM | POA: Diagnosis not present

## 2016-04-24 MED ORDER — NORTRIPTYLINE HCL 50 MG PO CAPS: 50.0000 mg | ORAL_CAPSULE | Freq: Every day | ORAL | Status: DC

## 2016-04-24 MED ORDER — CILOSTAZOL 50 MG PO TABS: 50.0000 mg | ORAL_TABLET | Freq: Two times a day (BID) | ORAL | Status: DC

## 2016-04-24 NOTE — Progress Notes (Signed)
[name] [address], [place] [address] Phone: [phone] Subjective:    I'm seeing this patient by the request  of:  Scarlette Calico, MD   CC: left leg pain Follow-up  NID:[id number] LUDWIN FLAHIVE is a 50 y.o. male coming in with complaint of left leg pain.  Patient has had this pain for greater than a year. Has been worked up for it including lumbar x-rays that were independently visualized by me showing very normal unremarkable for any bony or modalities.  Patient sent have significant varicose veins and likely secondary to patient's obesity was having more of this leg pain. Patient was to change shoes, once weekly vitamin D and some other over-the-counter medications. Was unable to tolerate gabapentin and was started on nortriptyline. Patient was to potentially try compression sleeves which he did not do. Patient was to use anti-inflammatories occasionally. Patient states still the same amount of pain. Patient states that it is more of a fatiguing when he is walking. Still happening after met multiple minutes. States that he has to take rests from time to time. Once again denies any significant back pain associated with it.      Past Medical History  Diagnosis Date  . Hypertension   . OSA on CPAP    Past Surgical History  Procedure Laterality Date  . Wisdom tooth extraction     Social History   Social History  . Marital Status: Single    Spouse Name: N/A  . Number of Children: N/A  . Years of Education: N/A   Occupational History  . self employeed--- office manager    Social History Main Topics  . Smoking status: Never Smoker   . Smokeless tobacco: Current User    Types: Chew  . Alcohol Use: No  . Drug Use: No  . Sexual Activity: Not Currently   Other Topics Concern  . None   Social History Narrative   Allergies  Allergen Reactions  . Lisinopril Cough   Family History  Problem Relation Age of Onset  . Colon cancer  Father   . Hypertension Father   . Alcohol abuse Neg Hx   . COPD Neg Hx   . Diabetes Neg Hx   . Early death Neg Hx   . Hearing loss Neg Hx   . Heart disease Neg Hx   . Hyperlipidemia Neg Hx   . Kidney disease Neg Hx   . Stroke Neg Hx   . Thyroid cancer Mother     Past medical history, social, surgical and family history all reviewed in electronic medical record.  No pertanent information unless stated regarding to the chief complaint.   Review of Systems: No headache, visual changes, nausea, vomiting, diarrhea, constipation, dizziness, abdominal pain, skin rash, fevers, chills, night sweats, weight loss, swollen lymph nodes, body aches, joint swelling, muscle aches, chest pain, shortness of breath, mood changes.   Objective Blood pressure 130/86, pulse 91, height 6' (1.829 m), weight 402 lb (182.346 kg), SpO2 96 %.  General: No apparent distress alert and oriented x3 mood and affect normal, dressed appropriately. Morbidly obese HEENT: Pupils equal, extraocular movements intact  Respiratory: Patient's speak in full sentences and does not appear short of breath  Cardiovascular: 1+lower extremity edema, non tender, no erythema  Skin: Warm dry intact with no signs of infection or rash on extremities or on axial skeleton.  Abdomen: Soft nontender difficult to assess secondary to body habitus Neuro: Cranial nerves II through  XII are intact, neurovascularly intact in all extremities with 2+ DTRs and 2+ pulses.  Lymph: No lymphadenopathy of posterior or anterior cervical chain or axillae bilaterally.  Gait normal with good balance and coordination.  MSK:  Non tender with full range of motion and good stability and symmetric strength and tone of shoulders, elbows, wrist, hip, knee and ankles bilaterally. All difficult to assess secondary to patient's body habitusPatient's lower extremity to have varicose veins bilaterally Back Exam:  Inspection: Unremarkable  Motion: Flexion 25 deg, Extension  15 deg, Side Bending to 25 deg bilaterally,  Rotation to 25 deg bilaterally  SLR laying: Negative  XSLR laying: Negative  Palpable tenderness: Increasing tenderness to palpation in the paraspinal musculature especially on the left side. FABER: negative. Sensory change: Gross sensation intact to all lumbar and sacral dermatomes.  Reflexes: 2+ at both patellar tendons, 2+ at achilles tendons, Babinski's downgoing.  Strength at foot  Plantar-flexion: 5/5 Dorsi-flexion: 5/5 Eversion: 5/5 Inversion: 5/5  Leg strength  4-5 but symmetric  No significant change from previous exam   Impression and Recommendations:     This case required medical decision making of moderate complexity.      Note: This dictation was prepared with Dragon dictation along with smaller phrase technology. Any transcriptional errors that result from this process are unintentional.

## 2016-04-24 NOTE — Patient Instructions (Addendum)
Good to see you  Sorry we have not found the answer yet.  Increase nortritpyline to 50 mg at night Pletal 50mg  2 times daily will help with some blood flow issues if this is causing it.  See me in 1 month and if not  A lot better we may consider a foraminal injection in your back.  Keep being active!

## 2016-04-24 NOTE — Progress Notes (Signed)
Pre visit review using our clinic review tool, if applicable. No additional management support is needed unless otherwise documented below in the visit note. 

## 2016-04-24 NOTE — Assessment & Plan Note (Signed)
Patient continues to have the leg pain left couldn't and right. Patient does have an MRI of the lumbar spine showing that there is some foraminal stenosis potentially avoid be consistent with his findings on the left side. We discussed with this patient is fairly adamant that he does feel that is more his legs. Continues to have the vericose veins. This could be also causing some of it. We will put patient on a peripheral calcium channel blocker to see if this will help with some vasodilation. Patient will continue with the nortriptyline with him not having any significant side effects and potentially helping him at rest. Patient and will come back and see me again in 4 weeks. At that time if continuing to have trouble I do feel that going after the lumbar pathology could be beneficial.  Spent  25 minutes with patient face-to-face and had greater than 50% of counseling including as described above in assessment and plan.

## 2016-04-29 DIAGNOSIS — G4733 Obstructive sleep apnea (adult) (pediatric): Secondary | ICD-10-CM | POA: Diagnosis not present

## 2016-05-27 ENCOUNTER — Ambulatory Visit: Payer: BLUE CROSS/BLUE SHIELD | Admitting: Family Medicine

## 2016-05-29 DIAGNOSIS — G4733 Obstructive sleep apnea (adult) (pediatric): Secondary | ICD-10-CM | POA: Diagnosis not present

## 2016-06-06 ENCOUNTER — Ambulatory Visit: Payer: BLUE CROSS/BLUE SHIELD | Admitting: Family Medicine

## 2016-06-13 ENCOUNTER — Ambulatory Visit: Payer: BLUE CROSS/BLUE SHIELD | Admitting: Family Medicine

## 2016-06-28 DIAGNOSIS — G4733 Obstructive sleep apnea (adult) (pediatric): Secondary | ICD-10-CM | POA: Diagnosis not present

## 2016-07-01 NOTE — Progress Notes (Signed)
[name] [address] [place], [place] [address] Phone: [phone] Subjective:    I'm seeing this patient by the request  of:  Scarlette Calico, MD   CC: left leg pain Follow-up  [id number]  [facility] is a 50 y.o. male coming in with complaint of left leg pain.  Patient has had this pain for greater than a year. Has been worked up for it including lumbar x-rays that were independently visualized by me showing very normal unremarkable for any bony or modalities.  Patient sent have significant varicose veins and likely secondary to patient's obesity was having more of this leg pain. Patient was to change shoes, once weekly vitamin D and some other over-the-counter medications. Was unable to tolerate gabapentin and was started on nortriptyline. Patient was titrated up  50 mg. In addition to this patient was started on Pletal. Patient states No significant improvement and if anything seems to be worsening. Feels that the severity of the pain when it does occur seems to be worsening. Starting to affect daily activities on a more frequent basis. Feels he is having side effects to the nortriptyline.    Past Medical History:  Diagnosis Date  . Hypertension   . OSA on CPAP    Past Surgical History:  Procedure Laterality Date  . WISDOM TOOTH EXTRACTION     Social History   Social History  . Marital status: Single    Spouse name: N/A  . Number of children: N/A  . Years of education: N/A   Occupational History  . self employeed--- office manager    Social History Main Topics  . Smoking status: Never Smoker  . Smokeless tobacco: Current User    Types: Chew  . Alcohol use No  . Drug use: No  . Sexual activity: Not Currently   Other Topics Concern  . Not on file   Social History Narrative  . No narrative on file   Allergies  Allergen Reactions  . Lisinopril Cough   Family History  Problem Relation Age of Onset  . Colon cancer Father   .  Hypertension Father   . Alcohol abuse Neg Hx   . COPD Neg Hx   . Diabetes Neg Hx   . Early death Neg Hx   . Hearing loss Neg Hx   . Heart disease Neg Hx   . Hyperlipidemia Neg Hx   . Kidney disease Neg Hx   . Stroke Neg Hx   . Thyroid cancer Mother     Past medical history, social, surgical and family history all reviewed in electronic medical record.  No pertanent information unless stated regarding to the chief complaint.   Review of Systems: No headache, visual changes, nausea, vomiting, diarrhea, constipation, dizziness, abdominal pain, skin rash, fevers, chills, night sweats, weight loss, swollen lymph nodes, body aches, joint swelling, muscle aches, chest pain, shortness of breath, mood changes.   Objective  There were no vitals taken for this visit.  General: No apparent distress alert and oriented x3 mood and affect normal, dressed appropriately. Morbidly obese HEENT: Pupils equal, extraocular movements intact  Respiratory: Patient's speak in full sentences and does not appear short of breath  Cardiovascular: 1+lower extremity edema, non tender, no erythema  Skin: Warm dry intact with no signs of infection or rash on extremities or on axial skeleton.  Abdomen: Soft nontender difficult to assess secondary to body habitus Neuro: Cranial nerves II through XII are intact, neurovascularly intact in  all extremities with 2+ DTRs and 2+ pulses.  Lymph: No lymphadenopathy of posterior or anterior cervical chain or axillae bilaterally.  Gait normal with good balance and coordination.  MSK:  Non tender with full range of motion and good stability and symmetric strength and tone of shoulders, elbows, wrist, hip, knee and ankles bilaterally. All difficult to assess secondary to patient's body habitusPatient's lower extremity to have varicose veins bilaterally Back Exam:  Inspection: Unremarkable  Motion: Flexion 25 deg, Extension 15 deg, Side Bending to 25 deg bilaterally,  Rotation to 25  deg bilaterally  SLR laying: Increasing tightness of the hamstring on the left side and does cause radicular symptoms. XSLR laying: Negative  Palpable tenderness: Increasing tenderness to palpation in the paraspinal musculature especially on the left side. Worse than previous exam FABER: negative. Sensory change: Gross sensation intact to all lumbar and sacral dermatomes.  Reflexes: 2+ at both patellar tendons, 2+ at achilles tendons, Babinski's downgoing.  Strength at foot  Plantar-flexion: 5/5 Dorsi-flexion: 5/5 Eversion: 5/5 Inversion: 5/5  Leg strength  4-5 but symmetric  Mild worsening from previous exam   Impression and Recommendations:     This case required medical decision making of moderate complexity.      Note: This dictation was prepared with Dragon dictation along with smaller phrase technology. Any transcriptional errors that result from this process are unintentional.

## 2016-07-02 ENCOUNTER — Other Ambulatory Visit: Payer: Self-pay | Admitting: Family Medicine

## 2016-07-02 ENCOUNTER — Telehealth: Payer: Self-pay | Admitting: Emergency Medicine

## 2016-07-02 ENCOUNTER — Encounter: Payer: Self-pay | Admitting: Family Medicine

## 2016-07-02 ENCOUNTER — Ambulatory Visit (INDEPENDENT_AMBULATORY_CARE_PROVIDER_SITE_OTHER): Payer: BLUE CROSS/BLUE SHIELD | Admitting: Family Medicine

## 2016-07-02 VITALS — BP 136/80 | HR 103 | Wt 397.0 lb

## 2016-07-02 DIAGNOSIS — M5416 Radiculopathy, lumbar region: Secondary | ICD-10-CM | POA: Diagnosis not present

## 2016-07-02 DIAGNOSIS — M79604 Pain in right leg: Secondary | ICD-10-CM | POA: Diagnosis not present

## 2016-07-02 DIAGNOSIS — M79605 Pain in left leg: Secondary | ICD-10-CM | POA: Diagnosis not present

## 2016-07-02 MED ORDER — VITAMIN D (ERGOCALCIFEROL) 1.25 MG (50000 UNIT) PO CAPS: 50000.0000 [IU] | ORAL_CAPSULE | ORAL | 0 refills | Status: DC

## 2016-07-02 MED ORDER — MELOXICAM 15 MG PO TABS: 15.0000 mg | ORAL_TABLET | Freq: Every day | ORAL | 1 refills | Status: DC

## 2016-07-02 MED ORDER — VENLAFAXINE HCL ER 37.5 MG PO CP24
37.5000 mg | ORAL_CAPSULE | Freq: Every day | ORAL | 1 refills | Status: DC
Start: 2016-07-02 — End: 2016-07-02

## 2016-07-02 MED ORDER — VENLAFAXINE HCL ER 37.5 MG PO CP24: 37.5000 mg | ORAL_CAPSULE | Freq: Every day | ORAL | 1 refills | Status: DC

## 2016-07-02 NOTE — Telephone Encounter (Signed)
Pt called and stated you talked to him about a prescription but he never received it. Please advise thanks.

## 2016-07-02 NOTE — Telephone Encounter (Signed)
Resent rx into [facility].

## 2016-07-02 NOTE — Telephone Encounter (Signed)
Refill done.  

## 2016-07-02 NOTE — Assessment & Plan Note (Signed)
Patient is having worsening of the bilateral leg pain. Patient and send this for quite some time. Nothing seems to be responding. Patient has had difficult straight leg test. This can be concerning for more of a back difficulty. MRI in 2015 did show an L1-L4 nerve root impingement on the left side secondary to arthritic changes. I do believe that an injection could be beneficial and this was ordered today. We will see how patient response and see how much of this is from radicular symptoms. We discussed with patient to continue the other medications and we did increase his Effexor to see if this would help with any of the nerve pain. Patient was doing better on the once weekly vitamin D supplementation we started that again. Once again encourage weight loss. Follow-up again in 6-8 weeks otherwise.

## 2016-07-02 NOTE — Patient Instructions (Addendum)
Good to see you Ice is your friend Stop the nortriptyline Start effexor 37.5 mg, may go up to 75mg  in the long run.  We will schedule yuu fr the injection in your back and see if that helps See me again in 3 weeks after the injection and see how you are doing.

## 2016-07-02 NOTE — Assessment & Plan Note (Signed)
Ordering a facet injection at L4-L5 to see if this will be beneficial and help with some of patient's discomfort.

## 2016-07-16 ENCOUNTER — Other Ambulatory Visit: Payer: Self-pay | Admitting: Family Medicine

## 2016-07-17 ENCOUNTER — Ambulatory Visit
Admission: RE | Admit: 2016-07-17 | Discharge: 2016-07-17 | Disposition: A | Discharge status: Home / Self Care | Payer: BLUE CROSS/BLUE SHIELD | Source: Ambulatory Visit | Attending: Family Medicine | Admitting: Family Medicine

## 2016-07-17 DIAGNOSIS — M5416 Radiculopathy, lumbar region: Secondary | ICD-10-CM

## 2016-07-17 NOTE — Telephone Encounter (Signed)
Refill done.  

## 2016-07-19 ENCOUNTER — Other Ambulatory Visit: Payer: BLUE CROSS/BLUE SHIELD

## 2016-07-23 ENCOUNTER — Other Ambulatory Visit: Payer: BLUE CROSS/BLUE SHIELD

## 2016-07-26 ENCOUNTER — Ambulatory Visit
Admission: RE | Admit: 2016-07-26 | Discharge: 2016-07-26 | Disposition: A | Discharge status: Home / Self Care | Payer: BLUE CROSS/BLUE SHIELD | Source: Ambulatory Visit | Attending: Family Medicine | Admitting: Family Medicine

## 2016-07-26 ENCOUNTER — Other Ambulatory Visit: Payer: Self-pay | Admitting: Family Medicine

## 2016-07-26 DIAGNOSIS — M5416 Radiculopathy, lumbar region: Secondary | ICD-10-CM

## 2016-07-26 DIAGNOSIS — M545 Low back pain: Secondary | ICD-10-CM | POA: Diagnosis not present

## 2016-07-26 MED ORDER — IOPAMIDOL (ISOVUE-M 200) INJECTION 41%
1.0000 mL | Freq: Once | INTRAMUSCULAR | Status: AC
  Administered 2016-07-26: 1 mL via INTRA_ARTICULAR

## 2016-07-26 MED ORDER — METHYLPREDNISOLONE ACETATE 40 MG/ML INJ SUSP (RADIOLOG
120.0000 mg | Freq: Once | INTRAMUSCULAR | Status: AC
Start: 2016-07-26 — End: 2016-07-26
  Administered 2016-07-26: 120 mg via INTRA_ARTICULAR

## 2016-07-26 NOTE — Discharge Instructions (Signed)
Post Procedure Spinal Discharge Instruction Sheet  1. You may resume a regular diet and any medications that you routinely take (including pain medications).  2. No driving day of procedure.  3. Light activity throughout the rest of the day.  Do not do any strenuous work, exercise, bending or lifting.  The day following the procedure, you can resume normal physical activity but you should refrain from exercising or physical therapy for at least three days thereafter.   Common Side Effects:   Headaches- take your usual medications as directed by your physician.  Increase your fluid intake.  Caffeinated beverages may be helpful.  Lie flat in bed until your headache resolves.   Restlessness or inability to sleep- you may have trouble sleeping for the next few days.  Ask your referring physician if you need any medication for sleep.   Facial flushing or redness- should subside within a few days.   Increased pain- a temporary increase in pain a day or two following your procedure is not unusual.  Take your pain medication as prescribed by your referring physician.   Leg cramps  Please contact our office at [phone] for the following symptoms:  Fever greater than 100 degrees.  Headaches unresolved with medication after 2-3 days.  Increased swelling, pain, or redness at injection site.  Thank you for visiting our office.    Please resume Pletal today

## 2016-07-31 DIAGNOSIS — G4733 Obstructive sleep apnea (adult) (pediatric): Secondary | ICD-10-CM | POA: Diagnosis not present

## 2016-08-01 ENCOUNTER — Telehealth: Payer: Self-pay | Admitting: Internal Medicine

## 2016-08-01 NOTE — Telephone Encounter (Signed)
Pt called in and said that he lost his script that Dr Tamala Julian gave him.   Can you

## 2016-08-15 NOTE — Progress Notes (Signed)
[name] [place],  [address] Phone: [phone] Subjective:    I'm seeing this patient by the request  of:  Scarlette Calico, MD   CC: left leg pain Follow-up  [id number]  Zachary Murray is a 50 y.o. male coming in with complaint of left leg pain.  Patient has had this pain for greater than a year. Has been worked up for it including lumbar x-rays that were independently visualized by me showing very normal unremarkable for any bony or modalities.  Patient sent have significant varicose veins and likely secondary to patient's obesity was having more of this leg pain. Patient was to change shoes, once weekly vitamin D and some other over-the-counter medications. Was unable to tolerate gabapentin and was started on nortriptyline. Patient was titrated up  50 mg. In addition to this patient was started on Pletal 2 exams ago.  Patient was not making any improvement and was started on Effexor. Patient states This did help minorly. Patient then was worked up for his back and was given an epidural. Patient states that this is the best his like it is felt and over a decade. Feels like he can do more activity and having less pain as well as increasing strength.    Past Medical History:  Diagnosis Date  . Hypertension   . OSA on CPAP    Past Surgical History:  Procedure Laterality Date  . WISDOM TOOTH EXTRACTION     Social History   Social History  . Marital status: Single    Spouse name: N/A  . Number of children: N/A  . Years of education: N/A   Occupational History  . self employeed--- office manager    Social History Main Topics  . Smoking status: Never Smoker  . Smokeless tobacco: Current User    Types: Chew  . Alcohol use No  . Drug use: No  . Sexual activity: Not Currently   Other Topics Concern  . None   Social History Narrative  . None   Allergies  Allergen Reactions  . Lisinopril Cough   Family History  Problem  Relation Age of Onset  . Colon cancer Father   . Hypertension Father   . Alcohol abuse Neg Hx   . COPD Neg Hx   . Diabetes Neg Hx   . Early death Neg Hx   . Hearing loss Neg Hx   . Heart disease Neg Hx   . Hyperlipidemia Neg Hx   . Kidney disease Neg Hx   . Stroke Neg Hx   . Thyroid cancer Mother     Past medical history, social, surgical and family history all reviewed in electronic medical record.  No pertanent information unless stated regarding to the chief complaint.   Review of Systems: No headache, visual changes, nausea, vomiting, diarrhea, constipation, dizziness, abdominal pain, skin rash, fevers, chills, night sweats, weight loss, swollen lymph nodes, body aches, joint swelling, muscle aches, chest pain, shortness of breath, mood changes.   Objective  Blood pressure 134/84, pulse 88, weight (!) 389 lb (176.4 kg), SpO2 97 %.  General: No apparent distress alert and oriented x3 mood and affect normal, dressed appropriately. Morbidly obese HEENT: Pupils equal, extraocular movements intact  Respiratory: Patient's speak in full sentences and does not appear short of breath  Cardiovascular: 1+lower extremity edema, non tender, no erythema  Skin: Warm dry intact with no signs of infection or rash on extremities or on axial skeleton.  Abdomen: Soft nontender difficult to assess secondary to body habitus Neuro: Cranial nerves II through XII are intact, neurovascularly intact in all extremities with 2+ DTRs and 2+ pulses.  Lymph: No lymphadenopathy of posterior or anterior cervical chain or axillae bilaterally.  Gait normal with good balance and coordination.  MSK:  Non tender with full range of motion and good stability and symmetric strength and tone of shoulders, elbows, wrist, hip, knee and ankles bilaterally.  Pain on exam today. Continues to have significant varicose veins.  Back Exam:  Inspection: Unremarkable  Motion: Flexion 25 deg, Extension 15 deg, Side Bending to 35 deg  bilaterally,  Rotation to 35 deg bilaterally  SLR laying: Increasing tightness of the hamstring on the left side and does cause radicular symptoms. XSLR laying: Negative  Palpable tenderness: minimal discomfort on exam.  FABER: negative. Sensory change: Gross sensation intact to all lumbar and sacral dermatomes.  Reflexes: 2+ at both patellar tendons, 2+ at achilles tendons, Babinski's downgoing.  Strength at foot  Plantar-flexion: 5/5 Dorsi-flexion: 5/5 Eversion: 5/5 Inversion: 5/5  Leg strength  4-5 but symmetric  Significant improvement from previous exam    Impression and Recommendations:     This case required medical decision making of moderate complexity.      Note: This dictation was prepared with Dragon dictation along with smaller phrase technology. Any transcriptional errors that result from this process are unintentional.

## 2016-08-16 ENCOUNTER — Encounter: Payer: Self-pay | Admitting: Family Medicine

## 2016-08-16 ENCOUNTER — Ambulatory Visit (INDEPENDENT_AMBULATORY_CARE_PROVIDER_SITE_OTHER): Payer: BLUE CROSS/BLUE SHIELD

## 2016-08-16 ENCOUNTER — Ambulatory Visit (INDEPENDENT_AMBULATORY_CARE_PROVIDER_SITE_OTHER): Payer: BLUE CROSS/BLUE SHIELD | Admitting: Family Medicine

## 2016-08-16 DIAGNOSIS — M5416 Radiculopathy, lumbar region: Secondary | ICD-10-CM | POA: Diagnosis not present

## 2016-08-16 DIAGNOSIS — Z23 Encounter for immunization: Secondary | ICD-10-CM

## 2016-08-16 MED ORDER — VENLAFAXINE HCL ER 37.5 MG PO CP24: 37.5000 mg | ORAL_CAPSULE | Freq: Every day | ORAL | 3 refills | Status: DC

## 2016-08-16 MED ORDER — CILOSTAZOL 50 MG PO TABS: 50.0000 mg | ORAL_TABLET | Freq: Two times a day (BID) | ORAL | 3 refills | Status: DC

## 2016-08-16 NOTE — Assessment & Plan Note (Signed)
Patient responded very well to the injections at this time. Continue with conservative therapy otherwise. Patient will try to titrate off some of the other medications. We discussed icing regimen. Discussed the importance of weight loss. Follow-up as needed as long as he does well. Discuss we can repeat steroid injection every 3-4 months if needed.

## 2016-08-16 NOTE — Patient Instructions (Addendum)
Good to see you  With the effexor and pletal will do 90 day supply Can play with the pletal at this time Send message and if pain comes back we can repeat injection in the back.  Start increasing your activity  Send me a message in 4 weeks so I know you are still doing well See me when you need me

## 2016-08-22 ENCOUNTER — Encounter: Payer: Self-pay | Admitting: Internal Medicine

## 2016-08-22 ENCOUNTER — Other Ambulatory Visit (INDEPENDENT_AMBULATORY_CARE_PROVIDER_SITE_OTHER): Payer: BLUE CROSS/BLUE SHIELD

## 2016-08-22 ENCOUNTER — Ambulatory Visit (INDEPENDENT_AMBULATORY_CARE_PROVIDER_SITE_OTHER): Payer: BLUE CROSS/BLUE SHIELD | Admitting: Internal Medicine

## 2016-08-22 VITALS — BP 118/70 | HR 100 | Temp 98.0°F | Resp 16 | Ht 72.0 in | Wt 384.1 lb

## 2016-08-22 DIAGNOSIS — E038 Other specified hypothyroidism: Secondary | ICD-10-CM

## 2016-08-22 DIAGNOSIS — I10 Essential (primary) hypertension: Secondary | ICD-10-CM

## 2016-08-22 DIAGNOSIS — E118 Type 2 diabetes mellitus with unspecified complications: Secondary | ICD-10-CM | POA: Diagnosis not present

## 2016-08-22 LAB — BASIC METABOLIC PANEL
BUN: 9 mg/dL (ref 6–23)
CO2: 32 mEq/L (ref 19–32)
Calcium: 9.8 mg/dL (ref 8.4–10.5)
Chloride: 99 mEq/L (ref 96–112)
Creatinine, Ser: 0.78 mg/dL (ref 0.40–1.50)
GFR: 110.78 mL/min (ref >60.00)
Glucose, Bld: 133 mg/dL — ABNORMAL HIGH (ref 70–99)
Potassium: 4.4 mEq/L (ref 3.5–5.1)
Sodium: 140 mEq/L (ref 135–145)

## 2016-08-22 LAB — TSH: TSH: 3.25 u[IU]/mL (ref 0.35–4.50)

## 2016-08-22 LAB — POCT GLUCOSE (DEVICE FOR HOME USE): Glucose Fasting, POC: 127 mg/dL — AB (ref 70–99)

## 2016-08-22 LAB — POCT GLYCOSYLATED HEMOGLOBIN (HGB A1C): Hemoglobin A1C: 6.4

## 2016-08-22 MED ORDER — SAXAGLIPTIN-METFORMIN ER 2.5-1000 MG PO TB24: 1.0000 | ORAL_TABLET | Freq: Every day | ORAL | 1 refills | Status: DC

## 2016-08-22 NOTE — Progress Notes (Signed)
Subjective:  Patient ID: Zachary Murray, male    DOB: 11-24-1965  Age: 51 y.o. MRN: [id number]  CC: Diabetes; Hypothyroidism; and Hypertension   [name] presents for follow-up on the above medical problems. He complains that the current dose of metformin causes diarrhea. A few months ago he was experiencing diarrhea so he stopped taking it for a few weeks and says the diarrhea resolved. Then a few weeks ago he restarted it the same dose and says the diarrhea has returned. He has made some progress on his last modifications and his lost weight. He denies any recent episodes of headache/blurred vision/shortness of breath/palpitations/edema/fatigue/or polys.  Outpatient Medications Prior to Visit  Medication Sig Dispense Refill  . Cholecalciferol (VITAMIN D3) 1000 UNITS CAPS Take 1 capsule by mouth daily.    . cilostazol (PLETAL) 50 MG tablet Take 1 tablet (50 mg total) by mouth 2 (two) times daily. 180 tablet 3  . Cyanocobalamin (VITAMIN B-12) 5000 MCG SUBL Place 1 tablet under the tongue daily. Reported on 02/08/2016    . glucosamine-chondroitin 500-400 MG tablet Take 1 tablet by mouth 3 (three) times daily.    . meloxicam (MOBIC) 15 MG tablet Take 1 tablet (15 mg total) by mouth daily. 90 tablet 1  . triamterene-hydrochlorothiazide (MAXZIDE-25) 37.5-25 MG tablet Take 1 tablet by mouth daily. 90 tablet 1  . venlafaxine XR (EFFEXOR XR) 37.5 MG 24 hr capsule Take 1 capsule (37.5 mg total) by mouth daily with breakfast. 90 capsule 3  . Vitamin D, Ergocalciferol, (DRISDOL) 50000 units CAPS capsule Take 1 capsule (50,000 Units total) by mouth every 7 (seven) days. 12 capsule 0  . Multiple Vitamins-Minerals (MULTIVITAMIN WITH MINERALS) tablet Take 1 tablet by mouth daily. Reported on 02/08/2016    . Saxagliptin-Metformin (KOMBIGLYZE XR) 2.03-999 MG TB24 Take 2 tablets by mouth daily. 180 tablet 1  . Vitamin D, Ergocalciferol, (DRISDOL) 50000 units CAPS capsule TAKE ONE CAPSULE BY MOUTH EVERY 7  DAYS 12 capsule 0   No facility-administered medications prior to visit.     ROS Review of Systems  Constitutional: Negative.  Negative for activity change, appetite change, diaphoresis, fatigue and unexpected weight change.  HENT: Negative.   Eyes: Negative for visual disturbance.  Respiratory: Negative.  Negative for cough, choking, chest tightness, shortness of breath, wheezing and stridor.   Cardiovascular: Negative.  Negative for chest pain, palpitations and leg swelling.  Gastrointestinal: Negative.  Negative for abdominal pain, constipation, diarrhea, nausea and vomiting.  Endocrine: Negative for polydipsia, polyphagia and polyuria.  Genitourinary: Negative.  Negative for decreased urine volume and difficulty urinating.  Musculoskeletal: Negative.  Negative for arthralgias, back pain, joint swelling, myalgias and neck pain.  Skin: Negative.  Negative for color change and rash.  Neurological: Negative.  Negative for dizziness, weakness and headaches.  Hematological: Negative.  Negative for adenopathy. Does not bruise/bleed easily.  Psychiatric/Behavioral: Negative.     Objective:  BP 118/70 (BP Location: Left Arm, Patient Position: Sitting, Cuff Size: Large)   Pulse 100   Temp 98 F (36.7 C) (Oral)   Resp 16   Ht 6' (1.829 m)   Wt (!) 384 lb 2 oz (174.2 kg)   SpO2 95%   BMI 52.10 kg/m   BP Readings from Last 3 Encounters:  08/22/16 118/70  08/16/16 134/84  07/26/16 (!) 156/110    Wt Readings from Last 3 Encounters:  08/22/16 (!) 384 lb 2 oz (174.2 kg)  08/16/16 (!) 389 lb (176.4 kg)  07/26/16 Marland Kitchen)  397 lb (180.1 kg)    Physical Exam  Constitutional: He is oriented to person, place, and time. No distress.  HENT:  Mouth/Throat: Oropharynx is clear and moist. No oropharyngeal exudate.  Eyes: Conjunctivae are normal. Right eye exhibits no discharge. Left eye exhibits no discharge. No scleral icterus.  Neck: Normal range of motion. Neck supple. No JVD present. No  tracheal deviation present. No thyromegaly present.  Cardiovascular: Normal rate, regular rhythm, normal heart sounds and intact distal pulses.  Exam reveals no gallop and no friction rub.   No murmur heard. Pulmonary/Chest: Effort normal and breath sounds normal. No stridor. No respiratory distress. He has no wheezes. He has no rales. He exhibits no tenderness.  Abdominal: Soft. Bowel sounds are normal. He exhibits no distension and no mass. There is no tenderness. There is no rebound and no guarding.  Musculoskeletal: Normal range of motion. He exhibits no edema, tenderness or deformity.  Lymphadenopathy:    He has no cervical adenopathy.  Neurological: He is oriented to person, place, and time.  Skin: Skin is warm and dry. No rash noted. He is not diaphoretic. No erythema. No pallor.  Psychiatric: He has a normal mood and affect. His behavior is normal. Judgment and thought content normal.  Vitals reviewed.   Lab Results  Component Value Date   WBC 7.3 02/08/2016   HGB 15.2 02/08/2016   HCT 44.7 02/08/2016   PLT 258.0 02/08/2016   GLUCOSE 133 (H) 08/22/2016   CHOL 130 02/08/2016   TRIG 54.0 02/08/2016   HDL 51.20 02/08/2016   LDLCALC 68 02/08/2016   ALT 25 11/24/2014   AST 30 11/24/2014   NA 140 08/22/2016   K 4.4 08/22/2016   CL 99 08/22/2016   CREATININE 0.78 08/22/2016   BUN 9 08/22/2016   CO2 32 08/22/2016   TSH 3.25 08/22/2016   HGBA1C 6.4 08/22/2016   MICROALBUR 3.1 (H) 02/08/2016    Dg Facet Jt Inj L /s  2nd Level Left W/fl/ct  Result Date: 07/26/2016 CLINICAL DATA:  Lumbosacral spondylosis without myelopathy. Pain in the posterior left thigh. Minimal back pain. Pain with palpation over the left lower lumbar spine. EXAM: DG FACET INJECTION LEFT; DG FACET INJECTION SECOND LEVEL LEFT PROCEDURE: The procedure, risks, benefits, and alternatives were explained to the patient. Questions regarding the procedure were encouraged and answered. The patient understands and  consents to the procedure. Left L4-5 FACET INJECTION: A posterior oblique approach was taken to the facet on the left at L4-5 using a curved 22 gauge spinal needle. Intra-articular positioning was confirmed by injecting a small amount of Isovue-M 200. No vascular opacification is seen. Sixty mg of Depo-Medrol mixed with 0.75 mL 1% lidocaine were instilled into the joint. Left L5-S1 FACET INJECTION: A posterior oblique approach was taken to the facet on the left at L5-S1 using a curved 22 gauge spinal needle. Intra-articular positioning was confirmed by injecting a small amount of Isovue-M 200. No vascular opacification is seen. Sixty mg of Depo-Medrol mixed with 0.75 mL 1% lidocaine were instilled into the joint. The injection resulted in concordant pain. The procedure was well-tolerated. IMPRESSION: Technically successful left L4-5 and L5-S1 facet injection #1. Electronically Signed   By: San Morelle M.D.   On: 07/26/2016 08:58   Dg Facet Jt Inj L /s Single Level Left W/fl/ct  Result Date: 07/26/2016 CLINICAL DATA:  Lumbosacral spondylosis without myelopathy. Pain in the posterior left thigh. Minimal back pain. Pain with palpation over the left lower lumbar spine.  EXAM: DG FACET INJECTION LEFT; DG FACET INJECTION SECOND LEVEL LEFT PROCEDURE: The procedure, risks, benefits, and alternatives were explained to the patient. Questions regarding the procedure were encouraged and answered. The patient understands and consents to the procedure. Left L4-5 FACET INJECTION: A posterior oblique approach was taken to the facet on the left at L4-5 using a curved 22 gauge spinal needle. Intra-articular positioning was confirmed by injecting a small amount of Isovue-M 200. No vascular opacification is seen. Sixty mg of Depo-Medrol mixed with 0.75 mL 1% lidocaine were instilled into the joint. Left L5-S1 FACET INJECTION: A posterior oblique approach was taken to the facet on the left at L5-S1 using a curved 22 gauge  spinal needle. Intra-articular positioning was confirmed by injecting a small amount of Isovue-M 200. No vascular opacification is seen. Sixty mg of Depo-Medrol mixed with 0.75 mL 1% lidocaine were instilled into the joint. The injection resulted in concordant pain. The procedure was well-tolerated. IMPRESSION: Technically successful left L4-5 and L5-S1 facet injection #1. Electronically Signed   By: San Morelle M.D.   On: 07/26/2016 08:58    Assessment & Plan:   Klayten was seen today for diabetes, hypothyroidism and hypertension.  Diagnoses and all orders for this visit:  Essential hypertension, benign- his blood pressure is well-controlled, electrolytes and renal function are stable. -     Basic metabolic panel; Future  Other specified hypothyroidism- he appears euthyroid and has a normal TSH, at this time I do not think he needs thyroid replacement therapy. -     TSH; Future  Type 2 diabetes mellitus with complication, without long-term current use of insulin ([place])- his A1c is down to 6.4% and he is experiencing diarrhea at the current dose of metformin which is 2000 mg a day. Will decrease the dose to 1000 mg a day, will continue the DPP4 inhibitor and will see if he can tolerate that without developing diarrhea. -     Saxagliptin-Metformin (KOMBIGLYZE XR) 2.03-999 MG TB24; Take 1 tablet by mouth daily. -     Basic metabolic panel; Future -     POCT Glucose (Device for Home Use) -     POCT glycosylated hemoglobin (Hb A1C)   I have discontinued Mr. Simpkins multivitamin with minerals. I have also changed his Saxagliptin-Metformin. Additionally, I am having him maintain his Vitamin B-12, Vitamin D3, glucosamine-chondroitin, triamterene-hydrochlorothiazide, meloxicam, Vitamin D (Ergocalciferol), venlafaxine XR, and cilostazol.  Meds ordered this encounter  Medications  . Saxagliptin-Metformin (KOMBIGLYZE XR) 2.03-999 MG TB24    Sig: Take 1 tablet by mouth daily.    Dispense:  90  tablet    Refill:  1     Follow-up: Return in about 6 months (around 02/20/2017).  Scarlette Calico, MD

## 2016-08-22 NOTE — Patient Instructions (Signed)

## 2016-08-22 NOTE — Progress Notes (Signed)
Pre visit review using our clinic review tool, if applicable. No additional management support is needed unless otherwise documented below in the visit note. 

## 2016-08-25 ENCOUNTER — Encounter: Payer: Self-pay | Admitting: Internal Medicine

## 2016-08-27 ENCOUNTER — Telehealth: Payer: Self-pay

## 2016-08-27 NOTE — Telephone Encounter (Signed)
Order [id number]

## 2016-08-30 DIAGNOSIS — G4733 Obstructive sleep apnea (adult) (pediatric): Secondary | ICD-10-CM | POA: Diagnosis not present

## 2016-10-01 DIAGNOSIS — G4733 Obstructive sleep apnea (adult) (pediatric): Secondary | ICD-10-CM | POA: Diagnosis not present

## 2016-10-02 ENCOUNTER — Other Ambulatory Visit: Payer: Self-pay | Admitting: Internal Medicine

## 2016-10-02 ENCOUNTER — Encounter: Payer: Self-pay | Admitting: Internal Medicine

## 2016-10-02 DIAGNOSIS — Z800 Family history of malignant neoplasm of digestive organs: Secondary | ICD-10-CM

## 2016-10-16 ENCOUNTER — Encounter: Payer: Self-pay | Admitting: Nurse Practitioner

## 2016-10-24 ENCOUNTER — Encounter: Payer: Self-pay | Admitting: Nurse Practitioner

## 2016-10-24 ENCOUNTER — Telehealth: Payer: Self-pay | Admitting: Emergency Medicine

## 2016-10-24 ENCOUNTER — Ambulatory Visit (INDEPENDENT_AMBULATORY_CARE_PROVIDER_SITE_OTHER): Payer: BLUE CROSS/BLUE SHIELD | Admitting: Nurse Practitioner

## 2016-10-24 VITALS — BP 118/70 | HR 84 | Ht 72.0 in | Wt 390.4 lb

## 2016-10-24 DIAGNOSIS — E669 Obesity, unspecified: Secondary | ICD-10-CM

## 2016-10-24 DIAGNOSIS — G4733 Obstructive sleep apnea (adult) (pediatric): Secondary | ICD-10-CM | POA: Diagnosis not present

## 2016-10-24 DIAGNOSIS — Z9989 Dependence on other enabling machines and devices: Secondary | ICD-10-CM

## 2016-10-24 DIAGNOSIS — Z800 Family history of malignant neoplasm of digestive organs: Secondary | ICD-10-CM | POA: Diagnosis not present

## 2016-10-24 MED ORDER — NA SULFATE-K SULFATE-MG SULF 17.5-3.13-1.6 GM/177ML PO SOLN
1.0000 | ORAL | 0 refills | Status: AC
Start: 2016-10-24 — End: 2016-11-23

## 2016-10-24 NOTE — Patient Instructions (Signed)

## 2016-10-24 NOTE — Progress Notes (Signed)
HPI:  Patient is a 50 yo male referred by PCP, Dr. Scarlette Calico for colon cancer screening. His father had colon cancer in his 46's. Patient has no gastrointestinal complaints.  Specifically, he has no abdominal pain, bowel changes, blood in stool. He gives a history of "borderline diabetes". He takes Pletal for claudication.    Past Medical History:  Diagnosis Date  . Hypertension   . OSA on CPAP   . Type II diabetes mellitus with manifestations [facility])     Past Surgical History:  Procedure Laterality Date  . WISDOM TOOTH EXTRACTION     Family History  Problem Relation Age of Onset  . Colon cancer Father   . Hypertension Father   . Thyroid cancer Mother   . Colon polyps Mother   . Alcohol abuse Neg Hx   . COPD Neg Hx   . Diabetes Neg Hx   . Early death Neg Hx   . Hearing loss Neg Hx   . Heart disease Neg Hx   . Hyperlipidemia Neg Hx   . Kidney disease Neg Hx   . Stroke Neg Hx    Social History  Substance Use Topics  . Smoking status: Never Smoker  . Smokeless tobacco: Current User    Types: Chew  . Alcohol use No   Current Outpatient Prescriptions  Medication Sig Dispense Refill  . Cholecalciferol (VITAMIN D3) 5000 units CAPS Take 1 capsule by mouth daily.    . cilostazol (PLETAL) 50 MG tablet Take 1 tablet (50 mg total) by mouth 2 (two) times daily. 180 tablet 3  . Cyanocobalamin (VITAMIN B-12) 5000 MCG SUBL Place 1 tablet under the tongue daily. Reported on 02/08/2016    . Glucosamine-Vitamin D (GLUCOSAMINE PLUS VITAMIN D) 1500-200 MG-UNIT TABS Take 1 capsule by mouth 2 (two) times daily.    . meloxicam (MOBIC) 15 MG tablet Take 1 tablet (15 mg total) by mouth daily. (Patient taking differently: Take 15 mg by mouth as needed. ) 90 tablet 1  . Saxagliptin-Metformin (KOMBIGLYZE XR) 2.03-999 MG TB24 Take 1 tablet by mouth daily. 90 tablet 1  . triamterene-hydrochlorothiazide (MAXZIDE-25) 37.5-25 MG tablet Take 1 tablet by mouth daily. 90 tablet 1  . venlafaxine  XR (EFFEXOR XR) 37.5 MG 24 hr capsule Take 1 capsule (37.5 mg total) by mouth daily with breakfast. 90 capsule 3   No current facility-administered medications for this visit.    Allergies  Allergen Reactions  . Lisinopril Cough    Review of Systems: All systems reviewed and negative except where noted in HPI.    Physical Exam: BP 118/70   Pulse 84   Ht 6' (1.829 m)   Wt (!) 390 lb 6.4 oz (177.1 kg)   BMI 52.95 kg/m  Constitutional:  Morbidly obese white male in no acute distress. Psychiatric: Normal mood and affect. Behavior is normal. HEENT: Normocephalic and atraumatic. Conjunctivae are normal. No scleral icterus. Neck supple.  Cardiovascular: Normal rate, regular rhythm. Varicose vein in bilateral lower extremitiioes Pulmonary/chest: Effort normal and breath sounds normal. No wheezing, rales or rhonchi. Abdominal: Soft, obese,  nontender. Bowel sounds active throughout. There are no masses palpable. No hepatomegaly. Extremities: no edema Lymphadenopathy: No cervical adenopathy noted. Neurological: Alert and oriented to person place and time. Skin: Skin is warm and dry. No rashes noted.   ASSESSMENT AND PLAN:  1. 50 yo male for colon cancer screening. He has a [facility] of colon cancer in father in his 25's.  -Patient will  be scheduled for a screening colonoscopy with possible polypectomy to be done at the hospital.  The risks and benefits of the procedure were discussed and the patient agrees to proceed.   2. Claudication, on pletal. Hold Pletal for 7 days before procedure - will instruct when and how to resume after procedure. Low but real risk of cardiovascular event such as heart attack, stroke, embolism, thrombosis or ischemia/infarct of other organs off.  The patient consents to proceed. Will communicate by phone or EMR with patient's prescribing provider to confirm that holding pletal is reasonable in this case.   3. Morbid obesity. Colonoscopy will be done at the hospital  based on BMI  4. OSA, on CPAP  Tye Savoy  10/24/2016, 9:01 AM   [name], MD

## 2016-10-24 NOTE — Telephone Encounter (Signed)
pletal is not an anti-coagulant. It is for helping blood flow to extremities. He can stop it if you would like for the procedure.  Thank you

## 2016-10-24 NOTE — Telephone Encounter (Signed)
10/24/2016   RE: SENOVIO YARA DOB: [birth year] MRN: [id number]   Dear Dr. Tamala Julian,    We have scheduled the above patient for an endoscopic procedure. Our records show that he is on anticoagulation therapy.   Please advise as to how long the patient may come off his therapy of Pletal prior to the procedure, which is scheduled for 11/12/16.  Please fax back/ or route the completed form to Tinnie Gens, [place].   Sincerely,    Tinnie Gens, [place]

## 2016-10-29 NOTE — Telephone Encounter (Signed)
Ok, doesn't need to hold pletal

## 2016-10-30 ENCOUNTER — Telehealth: Payer: Self-pay | Admitting: Nurse Practitioner

## 2016-10-30 NOTE — Telephone Encounter (Signed)
Patient informed. 

## 2016-10-31 NOTE — Progress Notes (Signed)
Reviewed and agree with documentation and assessment and plan. K. Veena Regena Delucchi , MD   

## 2016-10-31 NOTE — Telephone Encounter (Signed)
Called patients pharmacy and gave them the no more that $50 coupon. Patients copay is now $50 which he was ok with. Patient will pick up prep and call if he has any problems.

## 2016-11-05 ENCOUNTER — Encounter (HOSPITAL_COMMUNITY): Payer: Self-pay

## 2016-11-11 NOTE — Anesthesia Preprocedure Evaluation (Addendum)
Anesthesia Evaluation  Patient identified by MRN, date of birth, ID band Patient awake    Reviewed: Allergy & Precautions, NPO status , Patient's Chart, lab work & pertinent test results  History of Anesthesia Complications Negative for: history of anesthetic complications  Airway Mallampati: II  TM Distance: >3 FB Neck ROM: Full    Dental no notable dental hx. (+) Dental Advisory Given   Pulmonary sleep apnea and Continuous Positive Airway Pressure Ventilation ,    Pulmonary exam normal        Cardiovascular hypertension, negative cardio ROS Normal cardiovascular exam     Neuro/Psych negative neurological ROS  negative psych ROS   GI/Hepatic Neg liver ROS,   Endo/Other  diabetesHypothyroidism Morbid obesity  Renal/GU negative Renal ROS     Musculoskeletal negative musculoskeletal ROS (+)   Abdominal   Peds  Hematology negative hematology ROS (+)   Anesthesia Other Findings Day of surgery medications reviewed with the patient.  Reproductive/Obstetrics                            Anesthesia Physical Anesthesia Plan  ASA: III  Anesthesia Plan: MAC   Post-op Pain Management:    Induction:   Airway Management Planned: Natural Airway  Additional Equipment:   Intra-op Plan:   Post-operative Plan:   Informed Consent: I have reviewed the patients History and Physical, chart, labs and discussed the procedure including the risks, benefits and alternatives for the proposed anesthesia with the patient or authorized representative who has indicated his/her understanding and acceptance.   Dental advisory given  Plan Discussed with: CRNA and Anesthesiologist  Anesthesia Plan Comments:        Anesthesia Quick Evaluation

## 2016-11-12 ENCOUNTER — Encounter (HOSPITAL_COMMUNITY): Payer: Self-pay | Admitting: Gastroenterology

## 2016-11-12 ENCOUNTER — Ambulatory Visit (HOSPITAL_COMMUNITY): Payer: BLUE CROSS/BLUE SHIELD | Admitting: Anesthesiology

## 2016-11-12 ENCOUNTER — Ambulatory Visit (HOSPITAL_COMMUNITY)
Admission: RE | Admit: 2016-11-12 | Discharge: 2016-11-12 | Disposition: A | Discharge status: Home / Self Care | Payer: BLUE CROSS/BLUE SHIELD | Source: Ambulatory Visit | Attending: Gastroenterology | Admitting: Gastroenterology

## 2016-11-12 ENCOUNTER — Encounter (HOSPITAL_COMMUNITY)
Admission: RE | Disposition: A | Discharge status: Home / Self Care | Payer: Self-pay | Source: Ambulatory Visit | Attending: Gastroenterology

## 2016-11-12 DIAGNOSIS — Z1211 Encounter for screening for malignant neoplasm of colon: Secondary | ICD-10-CM

## 2016-11-12 DIAGNOSIS — Z7902 Long term (current) use of antithrombotics/antiplatelets: Secondary | ICD-10-CM | POA: Insufficient documentation

## 2016-11-12 DIAGNOSIS — E669 Obesity, unspecified: Secondary | ICD-10-CM

## 2016-11-12 DIAGNOSIS — Z800 Family history of malignant neoplasm of digestive organs: Secondary | ICD-10-CM | POA: Insufficient documentation

## 2016-11-12 DIAGNOSIS — Z6841 Body Mass Index (BMI) 40.0 and over, adult: Secondary | ICD-10-CM | POA: Diagnosis not present

## 2016-11-12 DIAGNOSIS — Z79899 Other long term (current) drug therapy: Secondary | ICD-10-CM | POA: Diagnosis not present

## 2016-11-12 DIAGNOSIS — I10 Essential (primary) hypertension: Secondary | ICD-10-CM | POA: Insufficient documentation

## 2016-11-12 DIAGNOSIS — E118 Type 2 diabetes mellitus with unspecified complications: Secondary | ICD-10-CM | POA: Insufficient documentation

## 2016-11-12 DIAGNOSIS — K648 Other hemorrhoids: Secondary | ICD-10-CM | POA: Insufficient documentation

## 2016-11-12 DIAGNOSIS — Z1212 Encounter for screening for malignant neoplasm of rectum: Secondary | ICD-10-CM | POA: Diagnosis not present

## 2016-11-12 DIAGNOSIS — G4733 Obstructive sleep apnea (adult) (pediatric): Secondary | ICD-10-CM | POA: Insufficient documentation

## 2016-11-12 DIAGNOSIS — Z7984 Long term (current) use of oral hypoglycemic drugs: Secondary | ICD-10-CM | POA: Diagnosis not present

## 2016-11-12 DIAGNOSIS — E785 Hyperlipidemia, unspecified: Secondary | ICD-10-CM | POA: Diagnosis not present

## 2016-11-12 HISTORY — PX: COLONOSCOPY WITH PROPOFOL: SHX5780

## 2016-11-12 LAB — GLUCOSE, CAPILLARY: Glucose-Capillary: 110 mg/dL — ABNORMAL HIGH (ref 65–99)

## 2016-11-12 SURGERY — COLONOSCOPY WITH PROPOFOL
Anesthesia: Monitor Anesthesia Care

## 2016-11-12 MED ORDER — HYDROMORPHONE HCL 1 MG/ML IJ SOLN: 0.2500 mg | INTRAMUSCULAR | Status: DC | PRN

## 2016-11-12 MED ORDER — PROPOFOL 10 MG/ML IV BOLUS
INTRAVENOUS | Status: AC
  Filled 2016-11-12: qty 40

## 2016-11-12 MED ORDER — LACTATED RINGERS IV SOLN
INTRAVENOUS | Status: DC | PRN
Start: 2016-11-12 — End: 2016-11-12
  Administered 2016-11-12: 10:00:00 via INTRAVENOUS

## 2016-11-12 MED ORDER — PROPOFOL 500 MG/50ML IV EMUL
INTRAVENOUS | Status: DC | PRN
  Administered 2016-11-12: 60 mg via INTRAVENOUS

## 2016-11-12 MED ORDER — SODIUM CHLORIDE 0.9 % IV SOLN: INTRAVENOUS | Status: DC

## 2016-11-12 MED ORDER — PROPOFOL 10 MG/ML IV BOLUS
INTRAVENOUS | Status: AC
  Filled 2016-11-12: qty 20

## 2016-11-12 MED ORDER — PROMETHAZINE HCL 25 MG/ML IJ SOLN
6.2500 mg | INTRAMUSCULAR | Status: DC | PRN
Start: 2016-11-12 — End: 2016-11-13

## 2016-11-12 MED ORDER — PROPOFOL 500 MG/50ML IV EMUL
INTRAVENOUS | Status: DC | PRN
  Administered 2016-11-12: 100 ug/kg/min via INTRAVENOUS

## 2016-11-12 SURGICAL SUPPLY — 21 items

## 2016-11-12 NOTE — H&P (View-Only) (Signed)
HPI:  Patient is a 50 yo male referred by PCP, Dr. Scarlette Calico for colon cancer screening. His father had colon cancer in his 21's. Patient has no gastrointestinal complaints.  Specifically, he has no abdominal pain, bowel changes, blood in stool. He gives a history of "borderline diabetes". He takes Pletal for claudication.    Past Medical History:  Diagnosis Date  . Hypertension   . OSA on CPAP   . Type II diabetes mellitus with manifestations [facility])     Past Surgical History:  Procedure Laterality Date  . WISDOM TOOTH EXTRACTION     Family History  Problem Relation Age of Onset  . Colon cancer Father   . Hypertension Father   . Thyroid cancer Mother   . Colon polyps Mother   . Alcohol abuse Neg Hx   . COPD Neg Hx   . Diabetes Neg Hx   . Early death Neg Hx   . Hearing loss Neg Hx   . Heart disease Neg Hx   . Hyperlipidemia Neg Hx   . Kidney disease Neg Hx   . Stroke Neg Hx    Social History  Substance Use Topics  . Smoking status: Never Smoker  . Smokeless tobacco: Current User    Types: Chew  . Alcohol use No   Current Outpatient Prescriptions  Medication Sig Dispense Refill  . Cholecalciferol (VITAMIN D3) 5000 units CAPS Take 1 capsule by mouth daily.    . cilostazol (PLETAL) 50 MG tablet Take 1 tablet (50 mg total) by mouth 2 (two) times daily. 180 tablet 3  . Cyanocobalamin (VITAMIN B-12) 5000 MCG SUBL Place 1 tablet under the tongue daily. Reported on 02/08/2016    . Glucosamine-Vitamin D (GLUCOSAMINE PLUS VITAMIN D) 1500-200 MG-UNIT TABS Take 1 capsule by mouth 2 (two) times daily.    . meloxicam (MOBIC) 15 MG tablet Take 1 tablet (15 mg total) by mouth daily. (Patient taking differently: Take 15 mg by mouth as needed. ) 90 tablet 1  . Saxagliptin-Metformin (KOMBIGLYZE XR) 2.03-999 MG TB24 Take 1 tablet by mouth daily. 90 tablet 1  . triamterene-hydrochlorothiazide (MAXZIDE-25) 37.5-25 MG tablet Take 1 tablet by mouth daily. 90 tablet 1  . venlafaxine  XR (EFFEXOR XR) 37.5 MG 24 hr capsule Take 1 capsule (37.5 mg total) by mouth daily with breakfast. 90 capsule 3   No current facility-administered medications for this visit.    Allergies  Allergen Reactions  . Lisinopril Cough    Review of Systems: All systems reviewed and negative except where noted in HPI.    Physical Exam: BP 118/70   Pulse 84   Ht 6' (1.829 m)   Wt (!) 390 lb 6.4 oz (177.1 kg)   BMI 52.95 kg/m  Constitutional:  Morbidly obese white male in no acute distress. Psychiatric: Normal mood and affect. Behavior is normal. HEENT: Normocephalic and atraumatic. Conjunctivae are normal. No scleral icterus. Neck supple.  Cardiovascular: Normal rate, regular rhythm. Varicose vein in bilateral lower extremitiioes Pulmonary/chest: Effort normal and breath sounds normal. No wheezing, rales or rhonchi. Abdominal: Soft, obese,  nontender. Bowel sounds active throughout. There are no masses palpable. No hepatomegaly. Extremities: no edema Lymphadenopathy: No cervical adenopathy noted. Neurological: Alert and oriented to person place and time. Skin: Skin is warm and dry. No rashes noted.   ASSESSMENT AND PLAN:  1. 50 yo male for colon cancer screening. He has a [facility] of colon cancer in father in his 55's.  -Patient will  be scheduled for a screening colonoscopy with possible polypectomy to be done at the hospital.  The risks and benefits of the procedure were discussed and the patient agrees to proceed.   2. Claudication, on pletal. Hold Pletal for 7 days before procedure - will instruct when and how to resume after procedure. Low but real risk of cardiovascular event such as heart attack, stroke, embolism, thrombosis or ischemia/infarct of other organs off.  The patient consents to proceed. Will communicate by phone or EMR with patient's prescribing provider to confirm that holding pletal is reasonable in this case.   3. Morbid obesity. Colonoscopy will be done at the hospital  based on BMI  4. OSA, on CPAP  Tye Savoy  10/24/2016, 9:01 AM   [name], MD

## 2016-11-12 NOTE — Transfer of Care (Signed)
Immediate Anesthesia Transfer of Care Note  Patient: Zachary Murray  Procedure(s) Performed: Procedure(s): COLONOSCOPY WITH PROPOFOL (N/A)  Patient Location: PACU  Anesthesia Type:MAC  Level of Consciousness: awake, alert  and oriented  Airway & Oxygen Therapy: Patient Spontanous Breathing and Patient connected to face mask oxygen  Post-op Assessment: Report given to RN and Post -op Vital signs reviewed and stable  Post vital signs: Reviewed and stable  Last Vitals:  Vitals:   11/12/16 0920  BP: (!) 164/102  Pulse: 75  Resp: (!) 21  Temp: 36.8 C    Last Pain: There were no vitals filed for this visit.       Complications: No apparent anesthesia complications

## 2016-11-12 NOTE — Anesthesia Postprocedure Evaluation (Signed)
Anesthesia Post Note  Patient: OLE DEBOLD  Procedure(s) Performed: Procedure(s) (LRB): COLONOSCOPY WITH PROPOFOL (N/A)  Patient location during evaluation: PACU Anesthesia Type: MAC Level of consciousness: awake and alert Pain management: pain level controlled Vital Signs Assessment: post-procedure vital signs reviewed and stable Respiratory status: spontaneous breathing and respiratory function stable Cardiovascular status: stable Anesthetic complications: no       Last Vitals:  Vitals:   11/12/16 1040 11/12/16 1050  BP: 126/82 128/87  Pulse: 87 83  Resp: (!) 27 14  Temp:      Last Pain:  Vitals:   11/12/16 1038  TempSrc: Oral                 Jahara Dail DANIEL

## 2016-11-12 NOTE — Interval H&P Note (Signed)
History and Physical Interval Note:  11/12/2016 8:36 AM  Zachary Murray  has presented today for surgery, with the diagnosis of obesity, family hx colon cancer.   The various methods of treatment have been discussed with the patient and family. After consideration of risks, benefits and other options for treatment, the patient has consented to  Procedure(s): COLONOSCOPY WITH PROPOFOL (N/A) as a surgical intervention .  The patient's history has been reviewed, patient examined, no change in status, stable for surgery.  I have reviewed the patient's chart and labs.  Questions were answered to the patient's satisfaction.     Kavitha Nandigam

## 2016-11-12 NOTE — Op Note (Signed)
[facility] Patient Name: Zachary Murray Procedure Date: 11/12/2016 MRN: [id number] Attending MD: Mauri Pole , MD Date of Birth: [birth year] CSN: [id number] Age: 50 Admit Type: Outpatient Procedure:                Colonoscopy Indications:              Screening in patient at increased risk: Family                            history of 1st-degree relative with colorectal                            cancer before age 71 years Providers:                Mauri Pole, MD, Zenon Mayo, RN, Elspeth Cho Tech., Technician, Rosario Adie, CRNA Referring MD:              Medicines:                Monitored Anesthesia Care Complications:            No immediate complications. Estimated Blood Loss:     Estimated blood loss: none. Procedure:                Pre-Anesthesia Assessment:                           - Prior to the procedure, a History and Physical                            was performed, and patient medications and                            allergies were reviewed. The patient's tolerance of                            previous anesthesia was also reviewed. The risks                            and benefits of the procedure and the sedation                            options and risks were discussed with the patient.                            All questions were answered, and informed consent                            was obtained. Prior Anticoagulants: The patient has                            taken no previous anticoagulant or antiplatelet  agents. ASA Grade Assessment: III - A patient with                            severe systemic disease. After reviewing the risks                            and benefits, the patient was deemed in                            satisfactory condition to undergo the procedure.                           After obtaining informed consent, the colonoscope              was passed under direct vision. Throughout the                            procedure, the patient's blood pressure, pulse, and                            oxygen saturations were monitored continuously. The                            [id number]) scope was introduced through                            the anus and advanced to the the cecum, identified                            by appendiceal orifice and ileocecal valve. The                            colonoscopy was somewhat difficult due to poor                            bowel prep. Successful completion of the procedure                            was aided by lavage. The patient tolerated the                            procedure well. The quality of the bowel                            preparation was fair. The ileocecal valve,                            appendiceal orifice, and rectum were photographed.                            The quality of the bowel preparation was evaluated                            using the BBPS (  Boston Bowel Preparation Scale)                            with scores of: Right Colon = 3 (entire mucosa seen                            well with no residual staining, small fragments of                            stool or opaque liquid), Transverse Colon = 2                            (minor amount of residual staining, small fragments                            of stool and/or opaque liquid, but mucosa seen                            well) and Left Colon = 2 (minor amount of residual                            staining, small fragments of stool and/or opaque                            liquid, but mucosa seen well). The total BBPS score                            equals 7. The quality of the bowel preparation was                            fair. Scope In: 10:14:39 AM Scope Out: 10:27:40 AM Total Procedure Duration: 0 hours 13 minutes 1 second  Findings:      The perianal and digital rectal  examinations were normal.      Non-bleeding internal hemorrhoids were found during retroflexion. The       hemorrhoids were medium-sized.      Bowel prep was suboptimal, could have missed lesions <56mm or flat polyps Impression:               - Preparation of the colon was fair.                           - Preparation of the colon was fair.                           - Non-bleeding internal hemorrhoids.                           - No specimens collected. Moderate Sedation:      N/A- Per Anesthesia Care Recommendation:           - Patient has a contact number available for                            emergencies. The signs and symptoms  of potential                            delayed complications were discussed with the                            patient. Return to normal activities tomorrow.                            Written discharge instructions were provided to the                            patient.                           - Resume previous diet.                           - Continue present medications.                           - Repeat colonoscopy in 1 year because the bowel                            preparation was suboptimal.                           - Return to GI clinic PRN.                           - For future colonoscopy the patient will require                            an extended preparation. If there are any                            questions, please contact the gastroenterologist. Procedure Code(s):        --- Professional ---                           G0105, Colorectal cancer screening; colonoscopy on                            individual at high risk Diagnosis Code(s):        --- Professional ---                           Z80.0, Family history of malignant neoplasm of                            digestive organs                           K64.8, Other hemorrhoids CPT copyright 2016 American [employer] Association. All rights reserved. The codes documented in this  report are preliminary and upon coder review may  be revised to meet current compliance requirements. Mauri Pole, MD  11/12/2016 10:34:05 AM This report has been signed electronically. Number of Addenda: 0

## 2016-11-12 NOTE — Discharge Instructions (Signed)

## 2016-11-13 ENCOUNTER — Encounter (HOSPITAL_COMMUNITY): Payer: Self-pay | Admitting: Gastroenterology

## 2016-11-22 NOTE — Telephone Encounter (Signed)
Canceled due to inactivity

## 2017-02-03 ENCOUNTER — Encounter: Payer: Self-pay | Admitting: Family Medicine

## 2017-02-03 ENCOUNTER — Other Ambulatory Visit: Payer: Self-pay

## 2017-02-03 DIAGNOSIS — M47816 Spondylosis without myelopathy or radiculopathy, lumbar region: Secondary | ICD-10-CM

## 2017-02-14 ENCOUNTER — Ambulatory Visit
Admission: RE | Admit: 2017-02-14 | Discharge: 2017-02-14 | Disposition: A | Discharge status: Home / Self Care | Payer: BLUE CROSS/BLUE SHIELD | Source: Ambulatory Visit | Attending: Family Medicine | Admitting: Family Medicine

## 2017-02-14 DIAGNOSIS — M47816 Spondylosis without myelopathy or radiculopathy, lumbar region: Secondary | ICD-10-CM

## 2017-02-14 DIAGNOSIS — M545 Low back pain: Secondary | ICD-10-CM | POA: Diagnosis not present

## 2017-02-14 MED ORDER — METHYLPREDNISOLONE ACETATE 40 MG/ML INJ SUSP (RADIOLOG
120.0000 mg | Freq: Once | INTRAMUSCULAR | Status: AC
  Administered 2017-02-14: 120 mg via INTRA_ARTICULAR

## 2017-02-14 MED ORDER — IOPAMIDOL (ISOVUE-M 200) INJECTION 41%
1.0000 mL | Freq: Once | INTRAMUSCULAR | Status: AC
  Administered 2017-02-14: 1 mL via INTRA_ARTICULAR

## 2017-02-14 NOTE — Discharge Instructions (Signed)

## 2017-03-03 ENCOUNTER — Other Ambulatory Visit: Payer: Self-pay | Admitting: Internal Medicine

## 2017-03-03 ENCOUNTER — Encounter: Payer: Self-pay | Admitting: Internal Medicine

## 2017-03-26 ENCOUNTER — Ambulatory Visit (INDEPENDENT_AMBULATORY_CARE_PROVIDER_SITE_OTHER): Payer: BLUE CROSS/BLUE SHIELD | Admitting: Internal Medicine

## 2017-03-26 ENCOUNTER — Other Ambulatory Visit (INDEPENDENT_AMBULATORY_CARE_PROVIDER_SITE_OTHER): Payer: BLUE CROSS/BLUE SHIELD

## 2017-03-26 ENCOUNTER — Encounter: Payer: Self-pay | Admitting: Internal Medicine

## 2017-03-26 VITALS — BP 120/74 | HR 88 | Temp 98.0°F | Ht 72.0 in | Wt 398.4 lb

## 2017-03-26 DIAGNOSIS — Z0000 Encounter for general adult medical examination without abnormal findings: Secondary | ICD-10-CM

## 2017-03-26 DIAGNOSIS — E785 Hyperlipidemia, unspecified: Secondary | ICD-10-CM

## 2017-03-26 DIAGNOSIS — I10 Essential (primary) hypertension: Secondary | ICD-10-CM

## 2017-03-26 DIAGNOSIS — F332 Major depressive disorder, recurrent severe without psychotic features: Secondary | ICD-10-CM | POA: Insufficient documentation

## 2017-03-26 DIAGNOSIS — E118 Type 2 diabetes mellitus with unspecified complications: Secondary | ICD-10-CM | POA: Diagnosis not present

## 2017-03-26 DIAGNOSIS — E038 Other specified hypothyroidism: Secondary | ICD-10-CM | POA: Diagnosis not present

## 2017-03-26 LAB — URINALYSIS, ROUTINE W REFLEX MICROSCOPIC
Bilirubin Urine: NEGATIVE
Hgb urine dipstick: NEGATIVE
Ketones, ur: NEGATIVE
Leukocytes, UA: NEGATIVE
Nitrite: NEGATIVE
RBC / HPF: NONE SEEN (ref 0–?)
Specific Gravity, Urine: 1.025 (ref 1.000–1.030)
Total Protein, Urine: NEGATIVE
Urine Glucose: NEGATIVE
Urobilinogen, UA: 0.2 (ref 0.0–1.0)
WBC, UA: NONE SEEN (ref 0–?)
pH: 6 (ref 5.0–8.0)

## 2017-03-26 LAB — LIPID PANEL
Cholesterol: 158 mg/dL (ref 0–200)
HDL: 65.7 mg/dL (ref >39.00)
LDL Cholesterol: 82 mg/dL (ref 0–99)
NonHDL: 91.94
Total CHOL/HDL Ratio: 2
Triglycerides: 49 mg/dL (ref 0.0–149.0)
VLDL: 9.8 mg/dL (ref 0.0–40.0)

## 2017-03-26 LAB — COMPREHENSIVE METABOLIC PANEL
ALT: 48 U/L (ref 0–53)
AST: 27 U/L (ref 0–37)
Albumin: 4.4 g/dL (ref 3.5–5.2)
Alkaline Phosphatase: 85 U/L (ref 39–117)
BUN: 16 mg/dL (ref 6–23)
CO2: 31 mEq/L (ref 19–32)
Calcium: 9.9 mg/dL (ref 8.4–10.5)
Chloride: 100 mEq/L (ref 96–112)
Creatinine, Ser: 0.84 mg/dL (ref 0.40–1.50)
GFR: 101.47 mL/min (ref >60.00)
Glucose, Bld: 140 mg/dL — ABNORMAL HIGH (ref 70–99)
Potassium: 4.2 mEq/L (ref 3.5–5.1)
Sodium: 140 mEq/L (ref 135–145)
Total Bilirubin: 1.1 mg/dL (ref 0.2–1.2)
Total Protein: 7.3 g/dL (ref 6.0–8.3)

## 2017-03-26 LAB — CBC WITH DIFFERENTIAL/PLATELET
Basophils Absolute: 0 10*3/uL (ref 0.0–0.1)
Basophils Relative: 0.5 % (ref 0.0–3.0)
Eosinophils Absolute: 0.2 10*3/uL (ref 0.0–0.7)
Eosinophils Relative: 1.9 % (ref 0.0–5.0)
HCT: 43.9 % (ref 39.0–52.0)
Hemoglobin: 15 g/dL (ref 13.0–17.0)
Lymphocytes Relative: 14.1 % (ref 12.0–46.0)
Lymphs Abs: 1.3 10*3/uL (ref 0.7–4.0)
MCHC: 34.3 g/dL (ref 30.0–36.0)
MCV: 89 fl (ref 78.0–100.0)
Monocytes Absolute: 0.9 10*3/uL (ref 0.1–1.0)
Monocytes Relative: 9.7 % (ref 3.0–12.0)
Neutro Abs: 6.7 10*3/uL (ref 1.4–7.7)
Neutrophils Relative %: 73.8 % (ref 43.0–77.0)
Platelets: 284 10*3/uL (ref 150.0–400.0)
RBC: 4.93 Mil/uL (ref 4.22–5.81)
RDW: 14.2 % (ref 11.5–15.5)
WBC: 9 10*3/uL (ref 4.0–10.5)

## 2017-03-26 LAB — MICROALBUMIN / CREATININE URINE RATIO
Creatinine,U: 173 mg/dL
Microalb Creat Ratio: 2.3 mg/g (ref 0.0–30.0)
Microalb, Ur: 4 mg/dL — ABNORMAL HIGH (ref 0.0–1.9)

## 2017-03-26 LAB — HEMOGLOBIN A1C: Hgb A1c MFr Bld: 6.6 % — ABNORMAL HIGH (ref 4.6–6.5)

## 2017-03-26 LAB — PSA: PSA: 0.47 ng/mL (ref 0.10–4.00)

## 2017-03-26 LAB — TSH: TSH: 4.33 u[IU]/mL (ref 0.35–4.50)

## 2017-03-26 MED ORDER — TRIAMTERENE-HCTZ 37.5-25 MG PO TABS: 1.0000 | ORAL_TABLET | Freq: Every day | ORAL | 1 refills | Status: DC

## 2017-03-26 MED ORDER — SAXAGLIPTIN-METFORMIN ER 2.5-1000 MG PO TB24: 1.0000 | ORAL_TABLET | Freq: Every day | ORAL | 1 refills | Status: DC

## 2017-03-26 MED ORDER — BUPROPION HCL 100 MG PO TABS: 100.0000 mg | ORAL_TABLET | Freq: Two times a day (BID) | ORAL | 0 refills | Status: DC

## 2017-03-26 NOTE — Progress Notes (Signed)
Subjective:  Patient ID: Zachary Murray, male    DOB: [birth year]  Age: 51 y.o. MRN: [id number]  CC: Annual Exam; Hypertension; Diabetes; Hyperlipidemia; and Depression   [name] presents for a CPX.  He tells me that a few months ago he thought Effexor was causing fatigue so he stopped taking it. He tells me the fatigue is a little better but now he complains of anhedonia, crying spells, and sadness. He wants to start a new antidepressant but not one that causes fatigue. He tells me his blood pressure and blood sugars have been well controlled. He offers no other complaints today.  Outpatient Medications Prior to Visit  Medication Sig Dispense Refill  . Cholecalciferol (VITAMIN D3) 5000 units CAPS Take 5,000 Units by mouth daily.     . cilostazol (PLETAL) 50 MG tablet Take 1 tablet (50 mg total) by mouth 2 (two) times daily. 180 tablet 3  . Cyanocobalamin (VITAMIN B-12) 5000 MCG SUBL Place 5,000 mcg under the tongue daily. Reported on 02/08/2016    . GNP GLUCOSAMINE-CHONDROITIN PO Take 1 tablet by mouth daily.    . Saxagliptin-Metformin (KOMBIGLYZE XR) 2.03-999 MG TB24 Take 1 tablet by mouth daily. 90 tablet 1  . triamterene-hydrochlorothiazide (MAXZIDE-25) 37.5-25 MG tablet Take 1 tablet by mouth daily. 90 tablet 1  . venlafaxine XR (EFFEXOR XR) 37.5 MG 24 hr capsule Take 1 capsule (37.5 mg total) by mouth daily with breakfast. 90 capsule 3  . meloxicam (MOBIC) 15 MG tablet Take 1 tablet (15 mg total) by mouth daily. (Patient not taking: Reported on 03/26/2017) 90 tablet 1   No facility-administered medications prior to visit.     ROS Review of Systems  Constitutional: Positive for fatigue. Negative for appetite change, chills and unexpected weight change.  HENT: Negative.  Negative for trouble swallowing.   Eyes: Negative for visual disturbance.  Respiratory: Negative for cough, chest tightness, shortness of breath and wheezing.   Cardiovascular: Negative for chest pain,  palpitations and leg swelling.  Gastrointestinal: Negative for abdominal pain, constipation, diarrhea, nausea and vomiting.  Endocrine: Negative for cold intolerance, polydipsia, polyphagia and polyuria.  Genitourinary: Negative.  Negative for difficulty urinating, hematuria, penile swelling, scrotal swelling, testicular pain and urgency.  Musculoskeletal: Negative.  Negative for back pain and myalgias.  Skin: Negative.  Negative for color change and rash.  Allergic/Immunologic: Negative.   Neurological: Negative.   Hematological: Negative for adenopathy. Does not bruise/bleed easily.  Psychiatric/Behavioral: Positive for dysphoric mood. Negative for agitation, behavioral problems, confusion, decreased concentration, hallucinations, self-injury, sleep disturbance and suicidal ideas. The patient is not nervous/anxious and is not hyperactive.     Objective:  BP 120/74 (BP Location: Left Arm, Patient Position: Sitting, Cuff Size: Large)   Pulse 88   Temp 98 F (36.7 C) (Oral)   Ht 6' (1.829 m)   Wt (!) 398 lb 6 oz (180.7 kg)   SpO2 98%   BMI 54.03 kg/m   BP Readings from Last 3 Encounters:  03/26/17 120/74  02/14/17 (!) 170/95  11/12/16 128/87    Wt Readings from Last 3 Encounters:  03/26/17 (!) 398 lb 6 oz (180.7 kg)  02/14/17 (!) 399 lb 1.6 oz (181 kg)  10/24/16 (!) 390 lb 6.4 oz (177.1 kg)    Physical Exam  Constitutional: He is oriented to person, place, and time. No distress.  HENT:  Mouth/Throat: Oropharynx is clear and moist. No oropharyngeal exudate.  Eyes: Conjunctivae are normal. Right eye exhibits no discharge. Left eye  exhibits no discharge. No scleral icterus.  Neck: Normal range of motion. Neck supple. No JVD present. No tracheal deviation present. No thyromegaly present.  Cardiovascular: Normal rate, regular rhythm, normal heart sounds and intact distal pulses.  Exam reveals no gallop and no friction rub.   No murmur heard. Pulmonary/Chest: Effort normal and  breath sounds normal. No stridor. No respiratory distress. He has no wheezes. He has no rales. He exhibits no tenderness.  Abdominal: Soft. Bowel sounds are normal. He exhibits no distension and no mass. There is no tenderness. There is no rebound and no guarding. Hernia confirmed negative in the right inguinal area and confirmed negative in the left inguinal area.  Genitourinary: Rectum normal, prostate normal, testes normal and penis normal. Rectal exam shows no external hemorrhoid, no internal hemorrhoid, no fissure, no mass, no tenderness, anal tone normal and guaiac negative stool. Prostate is not enlarged and not tender. Right testis shows no mass, no swelling and no tenderness. Right testis is descended. Left testis shows no mass, no swelling and no tenderness. Left testis is descended. Circumcised. No penile erythema or penile tenderness. No discharge found.  Musculoskeletal: Normal range of motion. He exhibits no edema, tenderness or deformity.  Lymphadenopathy:    He has no cervical adenopathy.       Right: No inguinal adenopathy present.       Left: No inguinal adenopathy present.  Neurological: He is oriented to person, place, and time.  Skin: Skin is warm and dry. No rash noted. He is not diaphoretic. No erythema. No pallor.  Psychiatric: He has a normal mood and affect. His behavior is normal. Judgment normal.  Vitals reviewed.   Lab Results  Component Value Date   WBC 9.0 03/26/2017   HGB 15.0 03/26/2017   HCT 43.9 03/26/2017   PLT 284.0 03/26/2017   GLUCOSE 140 (H) 03/26/2017   CHOL 158 03/26/2017   TRIG 49.0 03/26/2017   HDL 65.70 03/26/2017   LDLCALC 82 03/26/2017   ALT 48 03/26/2017   AST 27 03/26/2017   NA 140 03/26/2017   K 4.2 03/26/2017   CL 100 03/26/2017   CREATININE 0.84 03/26/2017   BUN 16 03/26/2017   CO2 31 03/26/2017   TSH 4.33 03/26/2017   PSA 0.47 03/26/2017   HGBA1C 6.6 (H) 03/26/2017   MICROALBUR 4.0 (H) 03/26/2017    Dg Facet Jt Inj C/t  2nd  Level Left W/fl/ct  Result Date: 02/14/2017 CLINICAL DATA:  Facet mediated lumbago. Excellent relief after the prior injection for over 2 months. EXAM: LEFT L4-5 and LEFT L5-S1  FACET INJECTION UNDER FLUOROSCOPY FLUOROSCOPY TIME:  13 seconds corresponding to a Dose Area Product of 74.48 Gy*m2 TECHNIQUE: An appropriate skin entry site was determined fluoroscopically and marked. Operator donned sterile gloves and mask. Site prepped with betadine, draped in usual sterile fashion, and infiltrated locally with 1% lidocaine. A 22 gauge 5 inch needle was advanced to the posterior margin of the LEFT L4-5 under fluoroscopy. Needle tip was confirmed fluoroscopically to be within the joint. Aspiration yielded no blood or CSF. 60mg  Depo-Medrol in 1 ml lidocaine 1% was administered. An identical procedure was performed at L5-S1 on the LEFT. Again 60 mg Depo-Medrol in 1 mL 1% lidocaine was administered. IMPRESSION: 1. Technically successful LEFT L4-5 and LEFT L5-S1 facet injection. Electronically Signed   By: Staci Righter M.D.   On: 02/14/2017 09:04   Dg Facet Jt Inj L /s Single Level Left W/fl/ct  Result Date: 02/14/2017 CLINICAL DATA:  Facet mediated lumbago. Excellent relief after the prior injection for over 2 months. EXAM: LEFT L4-5 and LEFT L5-S1  FACET INJECTION UNDER FLUOROSCOPY FLUOROSCOPY TIME:  13 seconds corresponding to a Dose Area Product of 74.48 Gy*m2 TECHNIQUE: An appropriate skin entry site was determined fluoroscopically and marked. Operator donned sterile gloves and mask. Site prepped with betadine, draped in usual sterile fashion, and infiltrated locally with 1% lidocaine. A 22 gauge 5 inch needle was advanced to the posterior margin of the LEFT L4-5 under fluoroscopy. Needle tip was confirmed fluoroscopically to be within the joint. Aspiration yielded no blood or CSF. 60mg  Depo-Medrol in 1 ml lidocaine 1% was administered. An identical procedure was performed at L5-S1 on the LEFT. Again 60 mg  Depo-Medrol in 1 mL 1% lidocaine was administered. IMPRESSION: 1. Technically successful LEFT L4-5 and LEFT L5-S1 facet injection. Electronically Signed   By: Staci Righter M.D.   On: 02/14/2017 09:04    Assessment & Plan:   Indie was seen today for annual exam, hypertension, diabetes, hyperlipidemia and depression.  Diagnoses and all orders for this visit:  Essential hypertension, benign- His blood pressure is well-controlled, electrolytes and renal function are normal. -     Comprehensive metabolic panel; Future -     CBC with Differential/Platelet; Future -     Urinalysis, Routine w reflex microscopic; Future -     triamterene-hydrochlorothiazide (MAXZIDE-25) 37.5-25 MG tablet; Take 1 tablet by mouth daily.  Other specified hypothyroidism- his TSH is in the normal range, thyroid replacement therapy is not indicated at this time. -     TSH; Future  Type 2 diabetes mellitus with complication, without long-term current use of insulin ([place])- his A1c is 6.6%, his blood sugars are adequately well controlled. -     Comprehensive metabolic panel; Future -     Hemoglobin A1c; Future -     Microalbumin / creatinine urine ratio; Future -     Saxagliptin-Metformin (KOMBIGLYZE XR) 2.03-999 MG TB24; Take 1 tablet by mouth daily.  Hyperlipidemia with target LDL less than 130- he has achieved his LDL goal is not willing to take a statin at this time. -     Lipid panel; Future  Routine general medical examination at a health care facility- exam completed, labs ordered and reviewed, vaccines reviewed, screening for colon cancer is up-to-date, patient education material was given. -     PSA; Future  Severe episode of recurrent major depressive disorder, without psychotic features ([place])- will start Wellbutrin at 200 mg a day and will increase the dose if indicated. -     buPROPion (WELLBUTRIN) 100 MG tablet; Take 1 tablet (100 mg total) by mouth 2 (two) times daily.   I have discontinued Mr. Minor  venlafaxine XR. I am also having him start on buPROPion. Additionally, I am having him maintain his Vitamin B-12, meloxicam, cilostazol, Vitamin D3, GNP GLUCOSAMINE-CHONDROITIN PO, triamterene-hydrochlorothiazide, and Saxagliptin-Metformin.  Meds ordered this encounter  Medications  . buPROPion (WELLBUTRIN) 100 MG tablet    Sig: Take 1 tablet (100 mg total) by mouth 2 (two) times daily.    Dispense:  180 tablet    Refill:  0  . triamterene-hydrochlorothiazide (MAXZIDE-25) 37.5-25 MG tablet    Sig: Take 1 tablet by mouth daily.    Dispense:  90 tablet    Refill:  1  . Saxagliptin-Metformin (KOMBIGLYZE XR) 2.03-999 MG TB24    Sig: Take 1 tablet by mouth daily.    Dispense:  90 tablet  Refill:  1     Follow-up: Return in about 6 weeks (around 05/07/2017).  Scarlette Calico, MD

## 2017-03-26 NOTE — Patient Instructions (Signed)
Major Depressive Disorder, Adult Major depressive disorder (MDD) is a mental health condition. It may also be called clinical depression or unipolar depression. MDD usually causes feelings of sadness, hopelessness, or helplessness. MDD can also cause physical symptoms. It can interfere with work, school, relationships, and other everyday activities. MDD may be mild, moderate, or severe. It may occur once (single episode major depressive disorder) or it may occur multiple times (recurrent major depressive disorder). What are the causes? The exact cause of this condition is not known. MDD is most likely caused by a combination of things, which may include:  Genetic factors. These are traits that are passed along from parent to child.  Individual factors. Your personality, your behavior, and the way you handle your thoughts and feelings may contribute to MDD. This includes personality traits and behaviors learned from others.  Physical factors, such as: ? Differences in the part of your brain that controls emotion. This part of your brain may be different than it is in people who do not have MDD. ? Long-term (chronic) medical or psychiatric illnesses.  Social factors. Traumatic experiences or major life changes may play a role in the development of MDD.  What increases the risk? This condition is more likely to develop in women. The following factors may also make you more likely to develop MDD:  A family history of depression.  Troubled family relationships.  Abnormally low levels of certain brain chemicals.  Traumatic events in childhood, especially abuse or the loss of a parent.  Being under a lot of stress, or long-term stress, especially from upsetting life experiences or losses.  A history of: ? Chronic physical illness. ? Other mental health disorders. ? Substance abuse.  Poor living conditions.  Experiencing social exclusion or discrimination on a regular basis.  What are  the signs or symptoms? The main symptoms of MDD typically include:  Constant depressed or irritable mood.  Loss of interest in things and activities.  MDD symptoms may also include:  Sleeping or eating too much or too little.  Unexplained weight change.  Fatigue or low energy.  Feelings of worthlessness or guilt.  Difficulty thinking clearly or making decisions.  Thoughts of suicide or of harming others.  Physical agitation or weakness.  Isolation.  Severe cases of MDD may also occur with other symptoms, such as:  Delusions or hallucinations, in which you imagine things that are not real (psychotic depression).  Low-level depression that lasts at least a year (chronic depression or persistent depressive disorder).  Extreme sadness and hopelessness (melancholic depression).  Trouble speaking and moving (catatonic depression).  How is this diagnosed? This condition may be diagnosed based on:  Your symptoms.  Your medical history, including your mental health history. This may involve tests to evaluate your mental health. You may be asked questions about your lifestyle, including any drug and alcohol use, and how long you have had symptoms of MDD.  A physical exam.  Blood tests to rule out other conditions.  You must have a depressed mood and at least four other MDD symptoms most of the day, nearly every day in the same 2-week timeframe before your health care provider can confirm a diagnosis of MDD. How is this treated? This condition is usually treated by mental health professionals, such as psychologists, psychiatrists, and clinical social workers. You may need more than one type of treatment. Treatment may include:  Psychotherapy. This is also called talk therapy or counseling. Types of psychotherapy include: ? Cognitive behavioral   therapy (CBT). This type of therapy teaches you to recognize unhealthy feelings, thoughts, and behaviors, and replace them with  positive thoughts and actions. ? Interpersonal therapy (IPT). This helps you to improve the way you relate to and communicate with others. ? Family therapy. This treatment includes members of your family.  Medicine to treat anxiety and depression, or to help you control certain emotions and behaviors.  Lifestyle changes, such as: ? Limiting alcohol and drug use. ? Exercising regularly. ? Getting plenty of sleep. ? Making healthy eating choices. ? Spending more time outdoors.  Treatments involving stimulation of the brain can be used in situations with extremely severe symptoms, or when medicine or other therapies do not work over time. These treatments include electroconvulsive therapy, transcranial magnetic stimulation, and vagal nerve stimulation. Follow these instructions at home: Activity  Return to your normal activities as told by your health care provider.  Exercise regularly and spend time outdoors as told by your health care provider. General instructions  Take over-the-counter and prescription medicines only as told by your health care provider.  Do not drink alcohol. If you drink alcohol, limit your alcohol intake to no more than 1 drink a day for nonpregnant women and 2 drinks a day for men. One drink equals 12 oz of beer, 5 oz of wine, or 1 oz of hard liquor. Alcohol can affect any antidepressant medicines you are taking. Talk to your health care provider about your alcohol use.  Eat a healthy diet and get plenty of sleep.  Find activities that you enjoy doing, and make time to do them.  Consider joining a support group. Your health care provider may be able to recommend a support group.  Keep all follow-up visits as told by your health care provider. This is important. Where to find more information: National Alliance on Mental Illness  www.nami.org  U.S. National Institute of Mental Health  www.nimh.nih.gov  National Suicide Prevention  Lifeline  1-800-273-TALK (8255). This is free, 24-hour help.  Contact a health care provider if:  Your symptoms get worse.  You develop new symptoms. Get help right away if:  You self-harm.  You have serious thoughts about hurting yourself or others.  You see, hear, taste, smell, or feel things that are not present (hallucinate). This information is not intended to replace advice given to you by your health care provider. Make sure you discuss any questions you have with your health care provider. Document Released: 03/01/2013 Document Revised: 07/11/2016 Document Reviewed: 05/15/2016 Elsevier Interactive Patient Education  2017 Elsevier Inc.  

## 2017-04-24 DIAGNOSIS — G4733 Obstructive sleep apnea (adult) (pediatric): Secondary | ICD-10-CM | POA: Diagnosis not present

## 2017-06-24 ENCOUNTER — Ambulatory Visit: Payer: BLUE CROSS/BLUE SHIELD | Admitting: Internal Medicine

## 2017-06-26 ENCOUNTER — Ambulatory Visit (INDEPENDENT_AMBULATORY_CARE_PROVIDER_SITE_OTHER): Payer: BLUE CROSS/BLUE SHIELD | Admitting: Internal Medicine

## 2017-06-26 ENCOUNTER — Encounter: Payer: Self-pay | Admitting: Internal Medicine

## 2017-06-26 VITALS — BP 130/82 | HR 98 | Temp 98.4°F | Resp 16 | Ht 72.0 in | Wt >= 6400 oz

## 2017-06-26 DIAGNOSIS — E118 Type 2 diabetes mellitus with unspecified complications: Secondary | ICD-10-CM | POA: Diagnosis not present

## 2017-06-26 DIAGNOSIS — M79604 Pain in right leg: Secondary | ICD-10-CM

## 2017-06-26 DIAGNOSIS — M79605 Pain in left leg: Secondary | ICD-10-CM | POA: Diagnosis not present

## 2017-06-26 DIAGNOSIS — I10 Essential (primary) hypertension: Secondary | ICD-10-CM | POA: Diagnosis not present

## 2017-06-26 DIAGNOSIS — F332 Major depressive disorder, recurrent severe without psychotic features: Secondary | ICD-10-CM

## 2017-06-26 LAB — POCT GLUCOSE (DEVICE FOR HOME USE): Glucose Fasting, POC: 122 mg/dL — AB (ref 70–99)

## 2017-06-26 LAB — POCT GLYCOSYLATED HEMOGLOBIN (HGB A1C): Hemoglobin A1C: 7

## 2017-06-26 MED ORDER — MELOXICAM 15 MG PO TABS: 15.0000 mg | ORAL_TABLET | Freq: Every day | ORAL | 1 refills | Status: DC

## 2017-06-26 MED ORDER — VORTIOXETINE HBR 5 MG PO TABS: 1.0000 | ORAL_TABLET | Freq: Every day | ORAL | 0 refills | Status: DC

## 2017-06-26 MED ORDER — EXENATIDE ER 2 MG/0.85ML ~~LOC~~ AUIJ: 1.0000 | AUTO-INJECTOR | SUBCUTANEOUS | 1 refills | Status: DC

## 2017-06-26 MED ORDER — TRIAMTERENE-HCTZ 37.5-25 MG PO TABS: 1.0000 | ORAL_TABLET | Freq: Every day | ORAL | 1 refills | Status: DC

## 2017-06-26 MED ORDER — CILOSTAZOL 50 MG PO TABS: 50.0000 mg | ORAL_TABLET | Freq: Two times a day (BID) | ORAL | 3 refills | Status: DC

## 2017-06-26 NOTE — Progress Notes (Signed)
Subjective:  Patient ID: Zachary Murray, male    DOB: [birth year]  Age: 51 y.o. MRN: [id number]  CC: Hypertension; Diabetes; and Depression   [name] presents for f/up - He complains that metformin has caused severe diarrhea. He stopped taking metformin and the diarrhea resolved. He complains that Wellbutrin has caused tremors. He also complains of worsening depression with anxiety, sadness, crying spells, fatigue, and anhedonia. He also has difficulty falling asleep. He previously tried Effexor and it had no effect. He took Prozac and it made him too sleepy. He recently tried Wellbutrin and it caused tremors. He denies SI or HI or feeling hopeless or helpless.  Outpatient Medications Prior to Visit  Medication Sig Dispense Refill  . Cholecalciferol (VITAMIN D3) 5000 units CAPS Take 5,000 Units by mouth daily.     . Cyanocobalamin (VITAMIN B-12) 5000 MCG SUBL Place 5,000 mcg under the tongue daily. Reported on 02/08/2016    . GNP GLUCOSAMINE-CHONDROITIN PO Take 1 tablet by mouth daily.    . cilostazol (PLETAL) 50 MG tablet Take 1 tablet (50 mg total) by mouth 2 (two) times daily. 180 tablet 3  . meloxicam (MOBIC) 15 MG tablet Take 1 tablet (15 mg total) by mouth daily. 90 tablet 1  . triamterene-hydrochlorothiazide (MAXZIDE-25) 37.5-25 MG tablet Take 1 tablet by mouth daily. 90 tablet 1  . buPROPion (WELLBUTRIN) 100 MG tablet Take 1 tablet (100 mg total) by mouth 2 (two) times daily. (Patient not taking: Reported on 06/26/2017) 180 tablet 0  . Saxagliptin-Metformin (KOMBIGLYZE XR) 2.03-999 MG TB24 Take 1 tablet by mouth daily. (Patient not taking: Reported on 06/26/2017) 90 tablet 1   No facility-administered medications prior to visit.     ROS Review of Systems  Constitutional: Negative for appetite change, diaphoresis, fatigue and unexpected weight change.  HENT: Negative.   Eyes: Negative for visual disturbance.  Respiratory: Negative for cough, chest tightness, shortness of breath  and wheezing.   Cardiovascular: Negative for chest pain, palpitations and leg swelling.  Gastrointestinal: Negative for abdominal pain, blood in stool, constipation, diarrhea, nausea and vomiting.  Endocrine: Negative.  Negative for polydipsia, polyphagia and polyuria.  Genitourinary: Negative.  Negative for difficulty urinating, dysuria and urgency.  Musculoskeletal: Positive for arthralgias. Negative for back pain, myalgias and neck pain.  Skin: Negative.  Negative for color change and rash.  Allergic/Immunologic: Negative.   Neurological: Negative.  Negative for dizziness, weakness and numbness.  Hematological: Negative.   Psychiatric/Behavioral: Positive for decreased concentration, dysphoric mood and sleep disturbance. Negative for agitation, behavioral problems, confusion, hallucinations, self-injury and suicidal ideas. The patient is not nervous/anxious and is not hyperactive.     Objective:  BP 130/82 (BP Location: Left Arm, Patient Position: Sitting, Cuff Size: Large)   Pulse 98   Temp 98.4 F (36.9 C) (Oral)   Resp 16   Ht 6' (1.829 m)   Wt (!) 400 lb 8 oz (181.7 kg)   SpO2 98%   BMI 54.32 kg/m   BP Readings from Last 3 Encounters:  06/26/17 130/82  03/26/17 120/74  02/14/17 (!) 170/95    Wt Readings from Last 3 Encounters:  06/26/17 (!) 400 lb 8 oz (181.7 kg)  03/26/17 (!) 398 lb 6 oz (180.7 kg)  02/14/17 (!) 399 lb 1.6 oz (181 kg)    Physical Exam  Constitutional: He is oriented to person, place, and time. No distress.  HENT:  Mouth/Throat: Oropharynx is clear and moist. No oropharyngeal exudate.  Eyes: Conjunctivae are normal.  Right eye exhibits no discharge. Left eye exhibits no discharge. No scleral icterus.  Neck: Normal range of motion. Neck supple. No JVD present. No thyromegaly present.  Cardiovascular: Normal rate, regular rhythm and intact distal pulses.  Exam reveals no gallop and no friction rub.   No murmur heard. Pulmonary/Chest: Effort normal and  breath sounds normal. No respiratory distress. He has no wheezes. He has no rales. He exhibits no tenderness.  Abdominal: Soft. Bowel sounds are normal. He exhibits no distension and no mass. There is no tenderness. There is no rebound and no guarding.  Musculoskeletal: Normal range of motion. He exhibits no edema, tenderness or deformity.  Lymphadenopathy:    He has no cervical adenopathy.  Neurological: He is alert and oriented to person, place, and time.  Skin: Skin is warm and dry. No rash noted. He is not diaphoretic. No erythema. No pallor.  Psychiatric: His speech is normal and behavior is normal. Judgment and thought content normal. His mood appears not anxious. His affect is not angry. He is not slowed, not withdrawn and not combative. Cognition and memory are normal. He exhibits a depressed mood. He expresses no homicidal and no suicidal ideation. He expresses no suicidal plans and no homicidal plans. He is attentive.  Vitals reviewed.   Lab Results  Component Value Date   WBC 9.0 03/26/2017   HGB 15.0 03/26/2017   HCT 43.9 03/26/2017   PLT 284.0 03/26/2017   GLUCOSE 140 (H) 03/26/2017   CHOL 158 03/26/2017   TRIG 49.0 03/26/2017   HDL 65.70 03/26/2017   LDLCALC 82 03/26/2017   ALT 48 03/26/2017   AST 27 03/26/2017   NA 140 03/26/2017   K 4.2 03/26/2017   CL 100 03/26/2017   CREATININE 0.84 03/26/2017   BUN 16 03/26/2017   CO2 31 03/26/2017   TSH 4.33 03/26/2017   PSA 0.47 03/26/2017   HGBA1C 7.0 06/26/2017   MICROALBUR 4.0 (H) 03/26/2017    Dg Facet Jt Inj C/t  2nd Level Left W/fl/ct  Result Date: 02/14/2017 CLINICAL DATA:  Facet mediated lumbago. Excellent relief after the prior injection for over 2 months. EXAM: LEFT L4-5 and LEFT L5-S1  FACET INJECTION UNDER FLUOROSCOPY FLUOROSCOPY TIME:  13 seconds corresponding to a Dose Area Product of 74.48 Gy*m2 TECHNIQUE: An appropriate skin entry site was determined fluoroscopically and marked. Operator donned sterile  gloves and mask. Site prepped with betadine, draped in usual sterile fashion, and infiltrated locally with 1% lidocaine. A 22 gauge 5 inch needle was advanced to the posterior margin of the LEFT L4-5 under fluoroscopy. Needle tip was confirmed fluoroscopically to be within the joint. Aspiration yielded no blood or CSF. 60mg  Depo-Medrol in 1 ml lidocaine 1% was administered. An identical procedure was performed at L5-S1 on the LEFT. Again 60 mg Depo-Medrol in 1 mL 1% lidocaine was administered. IMPRESSION: 1. Technically successful LEFT L4-5 and LEFT L5-S1 facet injection. Electronically Signed   By: Staci Righter M.D.   On: 02/14/2017 09:04   Dg Facet Jt Inj L /s Single Level Left W/fl/ct  Result Date: 02/14/2017 CLINICAL DATA:  Facet mediated lumbago. Excellent relief after the prior injection for over 2 months. EXAM: LEFT L4-5 and LEFT L5-S1  FACET INJECTION UNDER FLUOROSCOPY FLUOROSCOPY TIME:  13 seconds corresponding to a Dose Area Product of 74.48 Gy*m2 TECHNIQUE: An appropriate skin entry site was determined fluoroscopically and marked. Operator donned sterile gloves and mask. Site prepped with betadine, draped in usual sterile fashion, and infiltrated locally  with 1% lidocaine. A 22 gauge 5 inch needle was advanced to the posterior margin of the LEFT L4-5 under fluoroscopy. Needle tip was confirmed fluoroscopically to be within the joint. Aspiration yielded no blood or CSF. 60mg  Depo-Medrol in 1 ml lidocaine 1% was administered. An identical procedure was performed at L5-S1 on the LEFT. Again 60 mg Depo-Medrol in 1 mL 1% lidocaine was administered. IMPRESSION: 1. Technically successful LEFT L4-5 and LEFT L5-S1 facet injection. Electronically Signed   By: Staci Righter M.D.   On: 02/14/2017 09:04    Assessment & Plan:   Muad was seen today for hypertension, diabetes and depression.  Diagnoses and all orders for this visit:  Severe episode of recurrent major depressive disorder, without psychotic  features ([place])- will treat this with Trintellix. Will increase the dose over the next few weeks and months. -     vortioxetine HBr (TRINTELLIX) 5 MG TABS; Take 1 tablet (5 mg total) by mouth daily.  Essential hypertension, benign- his blood pressure is adequately well controlled.  Leg pain, bilateral -     meloxicam (MOBIC) 15 MG tablet; Take 1 tablet (15 mg total) by mouth daily.  Type 2 diabetes mellitus with complication, without long-term current use of insulin ([place])- will discontinue metformin and try to control his blood sugar with a GLP-1 agonist. -     Exenatide ER (BYDUREON BCISE) 2 MG/0.85ML AUIJ; Inject 1 Act into the skin once a week. -     Ambulatory referral to Ophthalmology   I have discontinued Mr. Radigan buPROPion and Saxagliptin-Metformin. I am also having him start on vortioxetine HBr and Exenatide ER. Additionally, I am having him maintain his Vitamin B-12, Vitamin D3, GNP GLUCOSAMINE-CHONDROITIN PO, cilostazol, triamterene-hydrochlorothiazide, and meloxicam.  Meds ordered this encounter  Medications  . vortioxetine HBr (TRINTELLIX) 5 MG TABS    Sig: Take 1 tablet (5 mg total) by mouth daily.    Dispense:  21 tablet    Refill:  0  . DISCONTD: meloxicam (MOBIC) 15 MG tablet    Sig: Take 1 tablet (15 mg total) by mouth daily.    Dispense:  90 tablet    Refill:  1  . Exenatide ER (BYDUREON BCISE) 2 MG/0.85ML AUIJ    Sig: Inject 1 Act into the skin once a week.    Dispense:  12 pen    Refill:  1  . cilostazol (PLETAL) 50 MG tablet    Sig: Take 1 tablet (50 mg total) by mouth 2 (two) times daily.    Dispense:  180 tablet    Refill:  3  . triamterene-hydrochlorothiazide (MAXZIDE-25) 37.5-25 MG tablet    Sig: Take 1 tablet by mouth daily.    Dispense:  90 tablet    Refill:  1  . meloxicam (MOBIC) 15 MG tablet    Sig: Take 1 tablet (15 mg total) by mouth daily.    Dispense:  90 tablet    Refill:  1     Follow-up: Return in about 3 months (around  09/26/2017).  Scarlette Calico, MD

## 2017-06-26 NOTE — Patient Instructions (Signed)

## 2017-07-15 ENCOUNTER — Encounter: Payer: Self-pay | Admitting: Internal Medicine

## 2017-07-15 ENCOUNTER — Other Ambulatory Visit: Payer: Self-pay | Admitting: Internal Medicine

## 2017-07-15 DIAGNOSIS — F332 Major depressive disorder, recurrent severe without psychotic features: Secondary | ICD-10-CM

## 2017-07-15 MED ORDER — SERTRALINE HCL 50 MG PO TABS: 50.0000 mg | ORAL_TABLET | Freq: Every day | ORAL | 3 refills | Status: DC

## 2017-07-17 ENCOUNTER — Telehealth: Payer: Self-pay

## 2017-07-17 NOTE — Telephone Encounter (Signed)
Key: [id number]

## 2017-07-18 ENCOUNTER — Encounter: Payer: Self-pay | Admitting: Internal Medicine

## 2017-07-22 NOTE — Telephone Encounter (Signed)
PA was denied.   Do you want to change to an alternative?  

## 2017-07-22 NOTE — Telephone Encounter (Signed)
Which GLP-1 agonist do they prefer?

## 2017-07-23 DIAGNOSIS — G4733 Obstructive sleep apnea (adult) (pediatric): Secondary | ICD-10-CM | POA: Diagnosis not present

## 2017-08-18 ENCOUNTER — Encounter: Payer: Self-pay | Admitting: Internal Medicine

## 2017-08-19 ENCOUNTER — Other Ambulatory Visit: Payer: Self-pay | Admitting: Family Medicine

## 2017-08-19 NOTE — Telephone Encounter (Signed)
Rec'd call from pt regarding this email he was advise that MD is out of office. He is wanting another MD to address it pls advise...Johny Chess

## 2017-08-20 MED ORDER — GLIPIZIDE 5 MG PO TABS: 5.0000 mg | ORAL_TABLET | Freq: Every day | ORAL | 1 refills | Status: DC

## 2017-08-20 MED ORDER — BUPROPION HCL 100 MG PO TABS: 100.0000 mg | ORAL_TABLET | Freq: Two times a day (BID) | ORAL | 0 refills | Status: DC

## 2017-08-20 NOTE — Telephone Encounter (Signed)
[facility] ([address] [place] [address] [phone] (Phone) [phone] (Fax)   Patient would like the Wellbutrin set in by tomorrow. He will be going out of town.

## 2017-08-20 NOTE — Telephone Encounter (Signed)
Pt called checking on this issue. He said that he is going out of town in a couple of days and is wanting to get this resolved before he leaves. Please advise.

## 2017-08-20 NOTE — Telephone Encounter (Signed)
Patient reports no intolerance to Wellbutrin. A 90 day supply of this was sent in. Also reports having a intolerance to Bydureon. Historically reports having intolerance to metformin. We'll send a glipizide for now.  Rosemarie Ax, MD [facility] 08/20/2017, 3:52 PM

## 2017-08-22 DIAGNOSIS — G4733 Obstructive sleep apnea (adult) (pediatric): Secondary | ICD-10-CM | POA: Diagnosis not present

## 2017-08-29 ENCOUNTER — Encounter: Payer: Self-pay | Admitting: Internal Medicine

## 2017-09-09 ENCOUNTER — Telehealth: Payer: Self-pay | Admitting: Internal Medicine

## 2017-09-09 DIAGNOSIS — M5416 Radiculopathy, lumbar region: Secondary | ICD-10-CM

## 2017-09-09 NOTE — Telephone Encounter (Signed)
Order for facet injection entered & sent to [facility]. Pt made aware & advised to call after injection scheduled & schedule an appt with dr Tamala Julian 2 weeks after injection. Pt understood.

## 2017-09-09 NOTE — Telephone Encounter (Signed)
Pt called and needs a referral to [facility] for more injections, please advise and call back

## 2017-09-22 DIAGNOSIS — G4733 Obstructive sleep apnea (adult) (pediatric): Secondary | ICD-10-CM | POA: Diagnosis not present

## 2017-09-24 ENCOUNTER — Ambulatory Visit
Admission: RE | Admit: 2017-09-24 | Discharge: 2017-09-24 | Disposition: A | Discharge status: Home / Self Care | Payer: BLUE CROSS/BLUE SHIELD | Source: Ambulatory Visit | Attending: Family Medicine | Admitting: Family Medicine

## 2017-09-24 DIAGNOSIS — M5416 Radiculopathy, lumbar region: Secondary | ICD-10-CM

## 2017-09-24 DIAGNOSIS — M545 Low back pain: Secondary | ICD-10-CM | POA: Diagnosis not present

## 2017-09-24 MED ORDER — METHYLPREDNISOLONE ACETATE 40 MG/ML INJ SUSP (RADIOLOG
120.0000 mg | Freq: Once | INTRAMUSCULAR | Status: AC
  Administered 2017-09-24: 120 mg via INTRA_ARTICULAR

## 2017-09-24 MED ORDER — IOPAMIDOL (ISOVUE-M 200) INJECTION 41%
1.0000 mL | Freq: Once | INTRAMUSCULAR | Status: AC
  Administered 2017-09-24: 1 mL via INTRA_ARTICULAR

## 2017-09-24 NOTE — Discharge Instructions (Signed)

## 2017-10-02 ENCOUNTER — Other Ambulatory Visit (INDEPENDENT_AMBULATORY_CARE_PROVIDER_SITE_OTHER): Payer: BLUE CROSS/BLUE SHIELD

## 2017-10-02 ENCOUNTER — Ambulatory Visit: Payer: Self-pay | Admitting: Internal Medicine

## 2017-10-02 ENCOUNTER — Ambulatory Visit: Payer: BLUE CROSS/BLUE SHIELD | Admitting: Family Medicine

## 2017-10-02 ENCOUNTER — Encounter: Payer: Self-pay | Admitting: Internal Medicine

## 2017-10-02 ENCOUNTER — Encounter: Payer: Self-pay | Admitting: Family Medicine

## 2017-10-02 ENCOUNTER — Telehealth: Payer: Self-pay | Admitting: Internal Medicine

## 2017-10-02 ENCOUNTER — Ambulatory Visit: Payer: BLUE CROSS/BLUE SHIELD | Admitting: Internal Medicine

## 2017-10-02 VITALS — BP 122/80 | HR 91 | Temp 97.7°F | Resp 16 | Ht 72.0 in | Wt 393.0 lb

## 2017-10-02 VITALS — BP 122/80 | HR 91 | Ht 72.0 in | Wt 393.0 lb

## 2017-10-02 DIAGNOSIS — G5712 Meralgia paresthetica, left lower limb: Secondary | ICD-10-CM

## 2017-10-02 DIAGNOSIS — Z800 Family history of malignant neoplasm of digestive organs: Secondary | ICD-10-CM | POA: Diagnosis not present

## 2017-10-02 DIAGNOSIS — I10 Essential (primary) hypertension: Secondary | ICD-10-CM

## 2017-10-02 DIAGNOSIS — F332 Major depressive disorder, recurrent severe without psychotic features: Secondary | ICD-10-CM

## 2017-10-02 DIAGNOSIS — E118 Type 2 diabetes mellitus with unspecified complications: Secondary | ICD-10-CM | POA: Diagnosis not present

## 2017-10-02 DIAGNOSIS — M5416 Radiculopathy, lumbar region: Secondary | ICD-10-CM

## 2017-10-02 DIAGNOSIS — E038 Other specified hypothyroidism: Secondary | ICD-10-CM

## 2017-10-02 DIAGNOSIS — Z23 Encounter for immunization: Secondary | ICD-10-CM

## 2017-10-02 DIAGNOSIS — M2550 Pain in unspecified joint: Secondary | ICD-10-CM

## 2017-10-02 LAB — BASIC METABOLIC PANEL
BUN: 17 mg/dL (ref 6–23)
CO2: 32 mEq/L (ref 19–32)
Calcium: 9.9 mg/dL (ref 8.4–10.5)
Chloride: 98 mEq/L (ref 96–112)
Creatinine, Ser: 0.84 mg/dL (ref 0.40–1.50)
GFR: 101.27 mL/min (ref >60.00)
Glucose, Bld: 134 mg/dL — ABNORMAL HIGH (ref 70–99)
Potassium: 4.1 mEq/L (ref 3.5–5.1)
Sodium: 139 mEq/L (ref 135–145)

## 2017-10-02 LAB — HEMOGLOBIN A1C: Hgb A1c MFr Bld: 6.6 % — ABNORMAL HIGH (ref 4.6–6.5)

## 2017-10-02 LAB — TESTOSTERONE: Testosterone: 246.5 ng/dL — ABNORMAL LOW (ref 300.00–890.00)

## 2017-10-02 LAB — IBC PANEL
Iron: 101 ug/dL (ref 42–165)
Saturation Ratios: 25.6 % (ref 20.0–50.0)
Transferrin: 282 mg/dL (ref 212.0–360.0)

## 2017-10-02 LAB — VITAMIN D 25 HYDROXY (VIT D DEFICIENCY, FRACTURES): VITD: 50.61 ng/mL (ref 30.00–100.00)

## 2017-10-02 LAB — SEDIMENTATION RATE: Sed Rate: 29 mm/hr — ABNORMAL HIGH (ref 0–20)

## 2017-10-02 LAB — URIC ACID: Uric Acid, Serum: 7.7 mg/dL (ref 4.0–7.8)

## 2017-10-02 LAB — TSH: TSH: 3.79 u[IU]/mL (ref 0.35–4.50)

## 2017-10-02 MED ORDER — TRIAMTERENE-HCTZ 37.5-25 MG PO TABS: 1.0000 | ORAL_TABLET | Freq: Every day | ORAL | 1 refills | Status: DC

## 2017-10-02 MED ORDER — BUPROPION HCL ER (XL) 300 MG PO TB24: 300.0000 mg | ORAL_TABLET | Freq: Every day | ORAL | 1 refills | Status: DC

## 2017-10-02 MED ORDER — VITAMIN D (ERGOCALCIFEROL) 1.25 MG (50000 UNIT) PO CAPS: 50000.0000 [IU] | ORAL_CAPSULE | ORAL | 0 refills | Status: DC

## 2017-10-02 NOTE — Progress Notes (Signed)
[facility] [address], [place] [address] Phone: [phone] Subjective:     CC: Back pain follow-up  LOV:[id number]  Zachary Murray is a 51 y.o. male coming in with complaint of back pain.  Found to have more thorough lumbar radiculopathy.  Patient has had MRI that showed more facet disease.  Patient did have bilateral facet injections at L4-L5 and L5-S1 on the left side on September 24, 2017.  Patient has had no to the last 12 months.  Patient states that he is still having joint pain. The injected did seem to help with the pain from his hamstring.  Patient will continues to have discomfort.  Patient states that those aches and pains overall.      Past Medical History:  Diagnosis Date  . Hypertension   . OSA on CPAP   . Type II diabetes mellitus with manifestations [facility])    Past Surgical History:  Procedure Laterality Date  . COLONOSCOPY WITH PROPOFOL N/A 11/12/2016   Procedure: COLONOSCOPY WITH PROPOFOL;  Surgeon: Mauri Pole, MD;  Location: [facility];  Service: Endoscopy;  Laterality: N/A;  . WISDOM TOOTH EXTRACTION     Social History   Socioeconomic History  . Marital status: Single    Spouse name: None  . Number of children: None  . Years of education: None  . Highest education level: None  Social Needs  . Financial resource strain: None  . Food insecurity - worry: None  . Food insecurity - inability: None  . Transportation needs - medical: None  . Transportation needs - non-medical: None  Occupational History  . Occupation: self employeed--- Glass blower/designer  Tobacco Use  . Smoking status: Never Smoker  . Smokeless tobacco: Current User    Types: Chew  Substance and Sexual Activity  . Alcohol use: No    Alcohol/week: 0.0 oz  . Drug use: No  . Sexual activity: Not Currently  Other Topics Concern  . None  Social History Narrative  . None   Allergies  Allergen Reactions  . Metformin And Related Diarrhea  .  Wellbutrin [Bupropion] Other (See Comments)    tremors  . Lisinopril Cough   Family History  Problem Relation Age of Onset  . Colon cancer Father   . Hypertension Father   . Thyroid cancer Mother   . Colon polyps Mother   . Alcohol abuse Neg Hx   . COPD Neg Hx   . Diabetes Neg Hx   . Early death Neg Hx   . Hearing loss Neg Hx   . Heart disease Neg Hx   . Hyperlipidemia Neg Hx   . Kidney disease Neg Hx   . Stroke Neg Hx      Past medical history, social, surgical and family history all reviewed in electronic medical record.  No pertanent information unless stated regarding to the chief complaint.   Review of Systems:Review of systems updated and as accurate as of 10/02/17  No headache, visual changes, nausea, vomiting, diarrhea, constipation, dizziness, abdominal pain, skin rash, fevers, chills, night sweats, weight loss, swollen lymph nodes, joint swelling, chest pain, shortness of breath, mood changes.  Positive muscle aches positive body aches  Objective  Blood pressure 122/80, pulse 91, height 6' (1.829 m), weight (!) 393 lb (178.3 kg), SpO2 95 %. Systems examined below as of 10/02/17   General: No apparent distress alert and oriented x3 mood and affect normal, dressed appropriately.  HEENT: Pupils equal, extraocular movements  intact  Respiratory: Patient's speak in full sentences and does not appear short of breath  Cardiovascular: No lower extremity edema, non tender, no erythema  Skin: Warm dry intact with no signs of infection or rash on extremities or on axial skeleton.  Abdomen: Soft nontender  Neuro: Cranial nerves II through XII are intact, neurovascularly intact in all extremities with 2+ DTRs and 2+ pulses.  Lymph: No lymphadenopathy of posterior or anterior cervical chain or axillae bilaterally.  Gait normal with good balance and coordination.  MSK: Mild tender with full range of motion and good stability and symmetric strength and tone of shoulders, elbows,  wrist, hip, knee and ankles bilaterally.   Back Exam:  Inspection: Mild loss of lordosis Motion: Flexion 30 deg, Extension 15 deg, Side Bending to 35 deg bilaterally,  Rotation to 35 deg bilaterally  SLR laying: Negative  XSLR laying: Negative  Palpable tenderness: Tender to palpation to the paraspinal muscular lumbar spine. FABER: Mild tightness bilaterally. Sensory change: Gross sensation intact to all lumbar and sacral dermatomes.  Reflexes: 2+ at both patellar tendons, 2+ at achilles tendons, Babinski's downgoing.  Strength at foot  Plantar-flexion: 5/5 Dorsi-flexion: 5/5 Eversion: 5/5 Inversion: 5/5  Leg strength  Quad: 5/5 Hamstring: 5/5 Hip flexor: 5/5 Hip abductors: 5/5  Gait unremarkable.    Impression and Recommendations:     This case required medical decision making of moderate complexity.      Note: This dictation was prepared with Dragon dictation along with smaller phrase technology. Any transcriptional errors that result from this process are unintentional.

## 2017-10-02 NOTE — Progress Notes (Signed)
Subjective:  Patient ID: Zachary Murray, male    DOB: May 31, 1966  Age: 51 y.o. MRN: [id number]  CC: Depression   [name] presents for f/up - His symptoms of depression are improving in that he is sleeping better and has an overall mood improvement but he continues to complain of apathy and anhedonia.  He wants to increase his dose of bupropion.  He is tolerating the current dose with no tremors, insomnia, or anxiety.  He also complains of a 10-15-year of persistent but worsening pain and numbness around his left anterior and lateral thigh. He describes it as an intermittent throbbing sensation.  He recently received epidural steroids in his back for low back pain with LE radicular sx's.  He also requests that I refer him back to GI.  He has a family history of colon cancer and had an inconclusive colonoscopy a year ago.  He was told to return to GI in 1 year for a repeat colonoscopy.  Outpatient Medications Prior to Visit  Medication Sig Dispense Refill  . cilostazol (PLETAL) 50 MG tablet Take 1 tablet (50 mg total) by mouth 2 (two) times daily. 180 tablet 3  . Exenatide ER (BYDUREON BCISE) 2 MG/0.85ML AUIJ Inject 1 Act into the skin once a week. 12 pen 1  . glipiZIDE (GLUCOTROL) 5 MG tablet Take 1 tablet (5 mg total) by mouth daily before breakfast. 30 tablet 1  . GNP GLUCOSAMINE-CHONDROITIN PO Take 1 tablet by mouth daily.    . Melatonin 3-10 MG TABS Take by mouth.    . meloxicam (MOBIC) 15 MG tablet Take 1 tablet (15 mg total) by mouth daily. 90 tablet 1  . Vitamin D, Ergocalciferol, (DRISDOL) 50000 units CAPS capsule Take 1 capsule (50,000 Units total) every 7 (seven) days by mouth. 12 capsule 0  . buPROPion (WELLBUTRIN) 100 MG tablet Take 1 tablet (100 mg total) by mouth 2 (two) times daily. 180 tablet 0  . triamterene-hydrochlorothiazide (MAXZIDE-25) 37.5-25 MG tablet Take 1 tablet by mouth daily. 90 tablet 1   No facility-administered medications prior to visit.      ROS Review of Systems  Constitutional: Negative.  Negative for chills, diaphoresis, fatigue and fever.  HENT: Negative.   Eyes: Negative for visual disturbance.  Respiratory: Negative for cough, chest tightness, shortness of breath and wheezing.   Cardiovascular: Negative for chest pain, palpitations and leg swelling.  Gastrointestinal: Negative for abdominal pain, constipation, diarrhea, nausea and vomiting.  Endocrine: Negative.   Genitourinary: Negative.  Negative for difficulty urinating, dysuria and frequency.  Musculoskeletal: Positive for arthralgias, back pain and myalgias.  Skin: Negative.  Negative for color change and rash.  Allergic/Immunologic: Negative.   Neurological: Positive for numbness. Negative for dizziness and weakness.  Hematological: Negative for adenopathy. Does not bruise/bleed easily.  Psychiatric/Behavioral: Positive for dysphoric mood. Negative for agitation, behavioral problems, confusion, decreased concentration, hallucinations, self-injury, sleep disturbance and suicidal ideas. The patient is not nervous/anxious and is not hyperactive.     Objective:  BP 122/80 (BP Location: Left Arm, Patient Position: Sitting, Cuff Size: Large)   Pulse 91   Temp 97.7 F (36.5 C) (Oral)   Resp 16   Ht 6' (1.829 m)   Wt (!) 393 lb (178.3 kg)   SpO2 95%   BMI 53.30 kg/m   BP Readings from Last 3 Encounters:  10/02/17 122/80  10/02/17 122/80  09/24/17 (!) 155/90    Wt Readings from Last 3 Encounters:  10/02/17 (!) 393 lb (  178.3 kg)  10/02/17 (!) 393 lb (178.3 kg)  06/26/17 (!) 400 lb 8 oz (181.7 kg)    Physical Exam  Constitutional: He is oriented to person, place, and time. No distress.  HENT:  Mouth/Throat: Oropharynx is clear and moist. No oropharyngeal exudate.  Eyes: Conjunctivae are normal. Right eye exhibits no discharge. Left eye exhibits no discharge. No scleral icterus.  Neck: Normal range of motion. Neck supple. No JVD present. No  thyromegaly present.  Cardiovascular: Normal rate, regular rhythm and intact distal pulses. Exam reveals no gallop and no friction rub.  No murmur heard. Pulmonary/Chest: Effort normal and breath sounds normal. No respiratory distress. He has no wheezes. He has no rales. He exhibits no tenderness.  Abdominal: Soft. Bowel sounds are normal. He exhibits no distension and no mass. There is no tenderness. There is no rebound and no guarding.  Musculoskeletal: Normal range of motion. He exhibits no edema, tenderness or deformity.  Lymphadenopathy:    He has no cervical adenopathy.  Neurological: He is alert and oriented to person, place, and time. He displays no atrophy, no tremor and normal reflexes. No cranial nerve deficit or sensory deficit. He exhibits normal muscle tone. Coordination and gait normal.  Reflex Scores:      Tricep reflexes are 0 on the right side and 0 on the left side.      Bicep reflexes are 0 on the right side and 0 on the left side.      Brachioradialis reflexes are 0 on the right side and 0 on the left side.      Patellar reflexes are 0 on the right side and 0 on the left side.      Achilles reflexes are 0 on the right side and 0 on the left side. Skin: Skin is warm and dry. No rash noted. He is not diaphoretic. No erythema. No pallor.  Vitals reviewed.   Lab Results  Component Value Date   WBC 9.0 03/26/2017   HGB 15.0 03/26/2017   HCT 43.9 03/26/2017   PLT 284.0 03/26/2017   GLUCOSE 134 (H) 10/02/2017   CHOL 158 03/26/2017   TRIG 49.0 03/26/2017   HDL 65.70 03/26/2017   LDLCALC 82 03/26/2017   ALT 48 03/26/2017   AST 27 03/26/2017   NA 139 10/02/2017   K 4.1 10/02/2017   CL 98 10/02/2017   CREATININE 0.84 10/02/2017   BUN 17 10/02/2017   CO2 32 10/02/2017   TSH 3.79 10/02/2017   PSA 0.47 03/26/2017   HGBA1C 6.6 (H) 10/02/2017   MICROALBUR 4.0 (H) 03/26/2017    Dg Facet Jt Inj L /s  2nd Level Left W/fl/ct  Result Date: 09/24/2017 CLINICAL DATA:   Facet medial lumbago. Excellent relief after the previous injection, greater than 80% for 6 months. EXAM: LEFT L4-5 AND LEFT L5-S1 FACET INJECTION UNDER FLUOROSCOPY TECHNIQUE: Informed written consent was obtained.  Time-out was performed. Overlying skin prepped with Betadine, draped in the usual sterile fashion, and infiltrated locally with 1% Lidocaine. Curved 22 gauge 5 inch spinal needle advanced to the posterior aspect of the left L4-5 facet. Injection of 1 ml Isovue-M 200 demonstrates intra-articular and periarticular spread. 60.0 mg Depo-Medrol and 2 ml Sensorcaine 0.25% was then administered. An identical procedure was performed on the LEFT at L5-S1, again administering 60.0 mg Depo-Medrol along with 2 mL 0.25% Sensorcaine. No immediate complication. Patient discharged home after adequate observation with a driver. FLUOROSCOPY TIME:  35 seconds corresponding to a Dose Area Product  of 156.02 Gy*m2 IMPRESSION: Technically successful left L4-5 and LEFT L5-S1 facet injection under fluoroscopy. Electronically Signed   By: Staci Righter M.D.   On: 09/24/2017 08:12   Dg Facet Jt Inj L /s Single Level Left W/fl/ct  Result Date: 09/24/2017 CLINICAL DATA:  Facet medial lumbago. Excellent relief after the previous injection, greater than 80% for 6 months. EXAM: LEFT L4-5 AND LEFT L5-S1 FACET INJECTION UNDER FLUOROSCOPY TECHNIQUE: Informed written consent was obtained.  Time-out was performed. Overlying skin prepped with Betadine, draped in the usual sterile fashion, and infiltrated locally with 1% Lidocaine. Curved 22 gauge 5 inch spinal needle advanced to the posterior aspect of the left L4-5 facet. Injection of 1 ml Isovue-M 200 demonstrates intra-articular and periarticular spread. 60.0 mg Depo-Medrol and 2 ml Sensorcaine 0.25% was then administered. An identical procedure was performed on the LEFT at L5-S1, again administering 60.0 mg Depo-Medrol along with 2 mL 0.25% Sensorcaine. No immediate complication.  Patient discharged home after adequate observation with a driver. FLUOROSCOPY TIME:  35 seconds corresponding to a Dose Area Product of 156.02 Gy*m2 IMPRESSION: Technically successful left L4-5 and LEFT L5-S1 facet injection under fluoroscopy. Electronically Signed   By: Staci Righter M.D.   On: 09/24/2017 08:12    Assessment & Plan:   Zachary Murray was seen today for depression.  Diagnoses and all orders for this visit:  Need for influenza vaccination -     Flu Vaccine QUAD 36+ mos IM  Essential hypertension, benign- His blood pressure is well controlled.  Electrolytes and renal function are normal. -     Basic metabolic panel; Future -     Hemoglobin A1c; Future  Type 2 diabetes mellitus with complication, without long-term current use of insulin ([place])- His A1c is 6.6%.  His blood sugars are adequately well controlled.  Family hx of colon cancer -     Ambulatory referral to Gastroenterology  Severe episode of recurrent major depressive disorder, without psychotic features ([place])- Will increase his dose of bupropion to achieve better symptom resolution. -     buPROPion (WELLBUTRIN XL) 300 MG 24 hr tablet; Take 1 tablet (300 mg total) daily by mouth.  Other specified hypothyroidism- His TSH is in the upper normal range but he is asymptomatic.  I do not think levothyroxine is indicated at this time. -     TSH; Future  Meralgia paresthetica, left- I have asked him to start physical therapy for symptom relief. -     Ambulatory referral to Physical Therapy   I have discontinued Zachary Murray's buPROPion. I am also having him start on buPROPion. Additionally, I am having him maintain his GNP GLUCOSAMINE-CHONDROITIN PO, Exenatide ER, cilostazol, meloxicam, glipiZIDE, Melatonin, and Vitamin D (Ergocalciferol).  Meds ordered this encounter  Medications  . buPROPion (WELLBUTRIN XL) 300 MG 24 hr tablet    Sig: Take 1 tablet (300 mg total) daily by mouth.    Dispense:  90 tablet    Refill:  1      Follow-up: Return in about 3 months (around 01/02/2018).  Scarlette Calico, MD

## 2017-10-02 NOTE — Telephone Encounter (Signed)
Pt called stating that he was seen today and his prescription for triamterene-hydrochlorothiazide (MAXZIDE-25) 37.5-25 MG tablet needs to be sent to [facility] on [address]. He said that he is going out of town tomorrow.

## 2017-10-02 NOTE — Assessment & Plan Note (Signed)
Patient does have more of a polyarthralgia.  We discussed with patient at great length.  Patient does have a family history significant for autoimmune disease.  Laboratory workup.  We will also include vitamin D, testosterone the other possibilities of chronic pain.

## 2017-10-02 NOTE — Patient Instructions (Addendum)
Good to see you  Change the vitamin D to once weekly  We will get labs with your family history just to make sure nothing else is contributing.  Can do injections as needed Consider decreasing the pletal to 1/2 tab for a week and see what you think See me again when you need me or after injections.  Happy holidays!

## 2017-10-02 NOTE — Assessment & Plan Note (Signed)
Patient has responded fairly well to the injections.  Patient has declined any other type of possible workup for surgical intervention.  Patient has had difficulty with certain medications and is happy with the results of the medications at this time.  We discussed the possibility of decreasing the Pletal patient will consider that.  Worsening pain will restart it.  Restarted once weekly vitamin D for strength and endurance.  Patient declined formal physical therapy.  Continues to be active.  Follow-up again 2 months.

## 2017-10-02 NOTE — Telephone Encounter (Signed)
This has been sent

## 2017-10-03 LAB — RHEUMATOID FACTOR: Rhuematoid fact SerPl-aCnc: <14 IU/mL (ref <14)

## 2017-10-03 LAB — PTH, INTACT AND CALCIUM
Calcium: 9.7 mg/dL (ref 8.6–10.3)
PTH: 54 pg/mL (ref 14–64)

## 2017-10-03 LAB — CALCIUM, IONIZED: Calcium, Ion: 5.3 mg/dL (ref 4.8–5.6)

## 2017-10-03 LAB — CYCLIC CITRUL PEPTIDE ANTIBODY, IGG: Cyclic Citrullin Peptide Ab: <16 UNITS

## 2017-10-03 LAB — ANA: Anti Nuclear Antibody(ANA): NEGATIVE

## 2017-10-05 ENCOUNTER — Encounter: Payer: Self-pay | Admitting: Internal Medicine

## 2017-10-05 NOTE — Patient Instructions (Signed)

## 2017-10-20 DIAGNOSIS — H25013 Cortical age-related cataract, bilateral: Secondary | ICD-10-CM | POA: Diagnosis not present

## 2017-10-20 DIAGNOSIS — H524 Presbyopia: Secondary | ICD-10-CM | POA: Diagnosis not present

## 2017-10-20 DIAGNOSIS — H16423 Pannus (corneal), bilateral: Secondary | ICD-10-CM | POA: Diagnosis not present

## 2017-10-20 DIAGNOSIS — E119 Type 2 diabetes mellitus without complications: Secondary | ICD-10-CM | POA: Diagnosis not present

## 2017-10-20 DIAGNOSIS — H2513 Age-related nuclear cataract, bilateral: Secondary | ICD-10-CM | POA: Diagnosis not present

## 2017-10-20 LAB — HM DIABETES EYE EXAM

## 2017-10-21 ENCOUNTER — Encounter: Payer: Self-pay | Admitting: Internal Medicine

## 2017-10-22 ENCOUNTER — Other Ambulatory Visit: Payer: Self-pay | Admitting: Internal Medicine

## 2017-10-22 ENCOUNTER — Encounter: Payer: Self-pay | Admitting: Internal Medicine

## 2017-10-22 DIAGNOSIS — F411 Generalized anxiety disorder: Secondary | ICD-10-CM

## 2017-10-22 DIAGNOSIS — G4733 Obstructive sleep apnea (adult) (pediatric): Secondary | ICD-10-CM | POA: Diagnosis not present

## 2017-10-22 HISTORY — DX: Generalized anxiety disorder: F41.1

## 2017-10-22 MED ORDER — CLONAZEPAM 1 MG PO TABS
1.0000 mg | ORAL_TABLET | Freq: Two times a day (BID) | ORAL | 1 refills | Status: DC | PRN
Start: 2017-10-22 — End: 2017-11-25

## 2017-11-21 ENCOUNTER — Encounter: Payer: Self-pay | Admitting: Gastroenterology

## 2017-11-21 DIAGNOSIS — G4733 Obstructive sleep apnea (adult) (pediatric): Secondary | ICD-10-CM | POA: Diagnosis not present

## 2017-11-25 ENCOUNTER — Other Ambulatory Visit: Payer: Self-pay | Admitting: Internal Medicine

## 2017-11-25 DIAGNOSIS — F411 Generalized anxiety disorder: Secondary | ICD-10-CM

## 2017-11-25 MED ORDER — ALPRAZOLAM 0.5 MG PO TABS: 0.5000 mg | ORAL_TABLET | Freq: Three times a day (TID) | ORAL | 1 refills | Status: DC | PRN

## 2017-12-04 ENCOUNTER — Encounter: Payer: Self-pay | Admitting: Internal Medicine

## 2017-12-22 DIAGNOSIS — G4733 Obstructive sleep apnea (adult) (pediatric): Secondary | ICD-10-CM | POA: Diagnosis not present

## 2018-01-02 ENCOUNTER — Other Ambulatory Visit: Payer: Self-pay | Admitting: Family Medicine

## 2018-01-02 NOTE — Telephone Encounter (Signed)
Refill done.  

## 2018-01-22 ENCOUNTER — Other Ambulatory Visit: Payer: Self-pay | Admitting: Internal Medicine

## 2018-01-22 DIAGNOSIS — F411 Generalized anxiety disorder: Secondary | ICD-10-CM

## 2018-02-11 ENCOUNTER — Encounter: Payer: Self-pay | Admitting: Internal Medicine

## 2018-02-11 ENCOUNTER — Other Ambulatory Visit (INDEPENDENT_AMBULATORY_CARE_PROVIDER_SITE_OTHER): Payer: BLUE CROSS/BLUE SHIELD

## 2018-02-11 ENCOUNTER — Ambulatory Visit: Payer: BLUE CROSS/BLUE SHIELD | Admitting: Internal Medicine

## 2018-02-11 VITALS — BP 118/76 | HR 93 | Temp 98.3°F | Ht 72.0 in | Wt >= 6400 oz

## 2018-02-11 DIAGNOSIS — R5382 Chronic fatigue, unspecified: Secondary | ICD-10-CM | POA: Insufficient documentation

## 2018-02-11 DIAGNOSIS — E559 Vitamin D deficiency, unspecified: Secondary | ICD-10-CM | POA: Diagnosis not present

## 2018-02-11 DIAGNOSIS — E291 Testicular hypofunction: Secondary | ICD-10-CM

## 2018-02-11 DIAGNOSIS — R5383 Other fatigue: Secondary | ICD-10-CM

## 2018-02-11 DIAGNOSIS — F411 Generalized anxiety disorder: Secondary | ICD-10-CM

## 2018-02-11 DIAGNOSIS — E038 Other specified hypothyroidism: Secondary | ICD-10-CM | POA: Diagnosis not present

## 2018-02-11 DIAGNOSIS — E118 Type 2 diabetes mellitus with unspecified complications: Secondary | ICD-10-CM | POA: Diagnosis not present

## 2018-02-11 DIAGNOSIS — I10 Essential (primary) hypertension: Secondary | ICD-10-CM | POA: Diagnosis not present

## 2018-02-11 DIAGNOSIS — E785 Hyperlipidemia, unspecified: Secondary | ICD-10-CM | POA: Diagnosis not present

## 2018-02-11 HISTORY — DX: Testicular hypofunction: E29.1

## 2018-02-11 LAB — BASIC METABOLIC PANEL
BUN: 14 mg/dL (ref 6–23)
CO2: 29 mEq/L (ref 19–32)
Calcium: 10.5 mg/dL (ref 8.4–10.5)
Chloride: 98 mEq/L (ref 96–112)
Creatinine, Ser: 0.8 mg/dL (ref 0.40–1.50)
GFR: 106.99 mL/min (ref >60.00)
Glucose, Bld: 165 mg/dL — ABNORMAL HIGH (ref 70–99)
Potassium: 3.7 mEq/L (ref 3.5–5.1)
Sodium: 137 mEq/L (ref 135–145)

## 2018-02-11 LAB — POCT GLYCOSYLATED HEMOGLOBIN (HGB A1C): Hemoglobin A1C: 7.8

## 2018-02-11 LAB — VITAMIN D 25 HYDROXY (VIT D DEFICIENCY, FRACTURES): VITD: 65.4 ng/mL (ref 30.00–100.00)

## 2018-02-11 LAB — TSH: TSH: 6.01 u[IU]/mL — ABNORMAL HIGH (ref 0.35–4.50)

## 2018-02-11 MED ORDER — INSULIN DEGLUDEC 100 UNIT/ML ~~LOC~~ SOPN: 30.0000 [IU] | PEN_INJECTOR | Freq: Every day | SUBCUTANEOUS | 1 refills | Status: DC

## 2018-02-11 MED ORDER — FREESTYLE LIBRE SENSOR SYSTEM MISC: 1.0000 | Freq: Every day | 11 refills | Status: DC

## 2018-02-11 MED ORDER — ALPRAZOLAM 0.5 MG PO TABS: 0.5000 mg | ORAL_TABLET | Freq: Three times a day (TID) | ORAL | 1 refills | Status: DC | PRN

## 2018-02-11 MED ORDER — FREESTYLE LIBRE READER DEVI: 1.0000 | Freq: Every day | 11 refills | Status: DC

## 2018-02-11 MED ORDER — LEVOTHYROXINE SODIUM 50 MCG PO TABS
50.0000 ug | ORAL_TABLET | Freq: Every day | ORAL | 0 refills | Status: DC
Start: 2018-02-11 — End: 2018-04-17

## 2018-02-11 MED ORDER — ATORVASTATIN CALCIUM 10 MG PO TABS: 10.0000 mg | ORAL_TABLET | Freq: Every day | ORAL | 1 refills | Status: DC

## 2018-02-11 MED ORDER — SEMAGLUTIDE(0.25 OR 0.5MG/DOS) 2 MG/1.5ML ~~LOC~~ SOPN: 0.5000 mg | PEN_INJECTOR | SUBCUTANEOUS | 1 refills | Status: DC

## 2018-02-11 NOTE — Patient Instructions (Signed)

## 2018-02-11 NOTE — Progress Notes (Signed)
Subjective:  Patient ID: Zachary Murray, male    DOB: 04/14/1966  Age: 52 y.o. MRN: [id number]  CC: Diabetes and Hypertension   [name] presents for f/up - He is concerned that his blood sugars have not been well controlled.  He is not taking the sulfonylurea and stopped using the GLP-1 agonist because it was leaving painful bumps under the skin.  He has had a few episodes of polyuria, polydipsia, and polyphagia.  He also complains of fatigue and difficulty losing weight.  His blood pressure has been well controlled and he denies any recent episodes of headache/blurred vision/chest pain/shortness of breath/or DOE.  He tells me his anxiety is very well controlled with the Xanax.  He does not feel depressed and does not want to start taking an antidepressant.  He has decided to start taking [place]. John's wort and 5HT.  Outpatient Medications Prior to Visit  Medication Sig Dispense Refill  . cilostazol (PLETAL) 50 MG tablet Take 1 tablet (50 mg total) by mouth 2 (two) times daily. 180 tablet 3  . Melatonin 3-10 MG TABS Take by mouth.    . triamterene-hydrochlorothiazide (MAXZIDE-25) 37.5-25 MG tablet Take 1 tablet daily by mouth. 90 tablet 1  . Vitamin D, Ergocalciferol, (DRISDOL) 50000 units CAPS capsule TAKE 1 CAPSULE BY MOUTH EVERY 7 DAYS 12 capsule 0  . ALPRAZolam (XANAX) 0.5 MG tablet TAKE 1 TABLET BY MOUTH THREE TIMES DAILY AS NEEDED FOR ANXIETY 90 tablet 1  . Exenatide ER (BYDUREON BCISE) 2 MG/0.85ML AUIJ Inject 1 Act into the skin once a week. (Patient not taking: Reported on 02/11/2018) 12 pen 1  . glipiZIDE (GLUCOTROL) 5 MG tablet Take 1 tablet (5 mg total) by mouth daily before breakfast. 30 tablet 1  . GNP GLUCOSAMINE-CHONDROITIN PO Take 1 tablet by mouth daily.    . meloxicam (MOBIC) 15 MG tablet Take 1 tablet (15 mg total) by mouth daily. (Patient not taking: Reported on 02/11/2018) 90 tablet 1   No facility-administered medications prior to visit.     ROS Review of  Systems  Constitutional: Positive for fatigue. Negative for appetite change, chills and diaphoresis.  HENT: Negative.  Negative for trouble swallowing.   Eyes: Negative for visual disturbance.  Respiratory: Negative for cough, chest tightness, shortness of breath and wheezing.   Cardiovascular: Negative for chest pain, palpitations and leg swelling.  Gastrointestinal: Negative for abdominal pain, constipation, diarrhea, nausea and vomiting.  Endocrine: Positive for polydipsia, polyphagia and polyuria. Negative for cold intolerance and heat intolerance.  Genitourinary: Negative.  Negative for difficulty urinating.  Musculoskeletal: Negative.  Negative for arthralgias, back pain, myalgias and neck pain.  Skin: Negative for color change, pallor and rash.  Allergic/Immunologic: Negative.   Neurological: Negative.  Negative for dizziness, weakness, light-headedness and numbness.  Hematological: Negative for adenopathy. Does not bruise/bleed easily.  Psychiatric/Behavioral: Negative.     Objective:  BP 118/76 (BP Location: Left Arm, Patient Position: Sitting, Cuff Size: Large)   Pulse 93   Temp 98.3 F (36.8 C) (Oral)   Ht 6' (1.829 m)   Wt (!) 400 lb (181.4 kg)   SpO2 95%   BMI 54.25 kg/m   BP Readings from Last 3 Encounters:  02/13/18 118/76  02/11/18 118/76  10/02/17 122/80    Wt Readings from Last 3 Encounters:  02/13/18 (!) 399 lb (181 kg)  02/11/18 (!) 400 lb (181.4 kg)  10/02/17 (!) 393 lb (178.3 kg)    Physical Exam  Constitutional: He is  oriented to person, place, and time. No distress.  HENT:  Mouth/Throat: Oropharynx is clear and moist. No oropharyngeal exudate.  Eyes: Conjunctivae are normal. Left eye exhibits no discharge. No scleral icterus.  Neck: Normal range of motion. Neck supple. No JVD present. No thyromegaly present.  Cardiovascular: Normal rate, regular rhythm and normal heart sounds. Exam reveals no gallop.  No murmur heard. Pulmonary/Chest: Effort  normal and breath sounds normal. No respiratory distress. He has no wheezes. He has no rales.  Abdominal: Soft. Bowel sounds are normal. He exhibits no distension and no mass. There is no tenderness. There is no guarding.  Musculoskeletal: Normal range of motion. He exhibits no edema, tenderness or deformity.  Lymphadenopathy:    He has no cervical adenopathy.  Neurological: He is alert and oriented to person, place, and time.  Skin: Skin is warm and dry. No rash noted. He is not diaphoretic. No erythema. No pallor.  Vitals reviewed.   Lab Results  Component Value Date   WBC 9.0 03/26/2017   HGB 15.0 03/26/2017   HCT 43.9 03/26/2017   PLT 284.0 03/26/2017   GLUCOSE 165 (H) 02/11/2018   CHOL 158 03/26/2017   TRIG 49.0 03/26/2017   HDL 65.70 03/26/2017   LDLCALC 82 03/26/2017   ALT 48 03/26/2017   AST 27 03/26/2017   NA 137 02/11/2018   K 3.7 02/11/2018   CL 98 02/11/2018   CREATININE 0.80 02/11/2018   BUN 14 02/11/2018   CO2 29 02/11/2018   TSH 6.01 (H) 02/11/2018   PSA 0.47 03/26/2017   HGBA1C 7.8 02/11/2018   MICROALBUR 4.0 (H) 03/26/2017    Dg Facet Jt Inj L /s  2nd Level Left W/fl/ct  Result Date: 09/24/2017 CLINICAL DATA:  Facet medial lumbago. Excellent relief after the previous injection, greater than 80% for 6 months. EXAM: LEFT L4-5 AND LEFT L5-S1 FACET INJECTION UNDER FLUOROSCOPY TECHNIQUE: Informed written consent was obtained.  Time-out was performed. Overlying skin prepped with Betadine, draped in the usual sterile fashion, and infiltrated locally with 1% Lidocaine. Curved 22 gauge 5 inch spinal needle advanced to the posterior aspect of the left L4-5 facet. Injection of 1 ml Isovue-M 200 demonstrates intra-articular and periarticular spread. 60.0 mg Depo-Medrol and 2 ml Sensorcaine 0.25% was then administered. An identical procedure was performed on the LEFT at L5-S1, again administering 60.0 mg Depo-Medrol along with 2 mL 0.25% Sensorcaine. No immediate  complication. Patient discharged home after adequate observation with a driver. FLUOROSCOPY TIME:  35 seconds corresponding to a Dose Area Product of 156.02 Gy*m2 IMPRESSION: Technically successful left L4-5 and LEFT L5-S1 facet injection under fluoroscopy. Electronically Signed   By: Staci Righter M.D.   On: 09/24/2017 08:12   Dg Facet Jt Inj L /s Single Level Left W/fl/ct  Result Date: 09/24/2017 CLINICAL DATA:  Facet medial lumbago. Excellent relief after the previous injection, greater than 80% for 6 months. EXAM: LEFT L4-5 AND LEFT L5-S1 FACET INJECTION UNDER FLUOROSCOPY TECHNIQUE: Informed written consent was obtained.  Time-out was performed. Overlying skin prepped with Betadine, draped in the usual sterile fashion, and infiltrated locally with 1% Lidocaine. Curved 22 gauge 5 inch spinal needle advanced to the posterior aspect of the left L4-5 facet. Injection of 1 ml Isovue-M 200 demonstrates intra-articular and periarticular spread. 60.0 mg Depo-Medrol and 2 ml Sensorcaine 0.25% was then administered. An identical procedure was performed on the LEFT at L5-S1, again administering 60.0 mg Depo-Medrol along with 2 mL 0.25% Sensorcaine. No immediate complication. Patient discharged  home after adequate observation with a driver. FLUOROSCOPY TIME:  35 seconds corresponding to a Dose Area Product of 156.02 Gy*m2 IMPRESSION: Technically successful left L4-5 and LEFT L5-S1 facet injection under fluoroscopy. Electronically Signed   By: Staci Righter M.D.   On: 09/24/2017 08:12    Assessment & Plan:   Zachary Murray was seen today for diabetes and hypertension.  Diagnoses and all orders for this visit:  Type 2 diabetes mellitus with complication, without long-term current use of insulin ([place])- His A1c is up to 7.8% and he is symptomatic.  I will change him to a different GLP-1 agonist and have asked him to start using a basal insulin. -     Basic metabolic panel; Future -     Cancel: Hemoglobin A1c; Future -      POCT glycosylated hemoglobin (Hb A1C) -     Semaglutide (OZEMPIC) 0.25 or 0.5 MG/DOSE SOPN; Inject 0.5 mg into the skin once a week. -     Continuous Blood Gluc Receiver (FREESTYLE LIBRE READER) DEVI; 1 Act by Does not apply route daily. -     Continuous Blood Gluc Sensor (Volo) MISC; 1 Act by Does not apply route daily. -     insulin degludec (TRESIBA FLEXTOUCH) 100 UNIT/ML SOPN FlexTouch Pen; Inject 0.3 mLs (30 Units total) into the skin daily at 10 pm. -     atorvastatin (LIPITOR) 10 MG tablet; Take 1 tablet (10 mg total) by mouth daily.  Essential hypertension, benign- His blood pressure is adequately well controlled.  Electrolytes and renal function are normal. -     Basic metabolic panel; Future  Other specified hypothyroidism- His TSH is slightly elevated and he is symptomatic.  I have therefore recommended that he start taking thyroid replacement therapy. -     TSH; Future -     levothyroxine (SYNTHROID, LEVOTHROID) 50 MCG tablet; Take 1 tablet (50 mcg total) by mouth daily.  GAD (generalized anxiety disorder) -     ALPRAZolam (XANAX) 0.5 MG tablet; Take 1 tablet (0.5 mg total) by mouth 3 (three) times daily as needed. for anxiety  Fatigue, unspecified type - I will treat the hypothyroidism and his testosterone level is low so I have asked him if he wants to consider starting testosterone replacement therapy. -     Testosterone Total,Free,Bio, Males; Future  Vitamin D deficiency- His vitamin D level is in the normal range now.  Will continue the current vitamin D supplement. -     VITAMIN D 25 Hydroxy (Vit-D Deficiency, Fractures); Future  Hypogonadism male -     Testosterone Total,Free,Bio, Males; Future -     Prolactin; Future  Hyperlipidemia with target LDL less than 130- I have asked him to start taking a statin for CV risk reduction. -     atorvastatin (LIPITOR) 10 MG tablet; Take 1 tablet (10 mg total) by mouth daily.   I have discontinued  Zachary Mose. Deakin "Ken"'s GNP GLUCOSAMINE-CHONDROITIN PO, Exenatide ER, meloxicam, and glipiZIDE. I have also changed his ALPRAZolam. Additionally, I am having him start on Semaglutide, FREESTYLE LIBRE READER, [place], insulin degludec, atorvastatin, and levothyroxine. Lastly, I am having him maintain his cilostazol, Melatonin, triamterene-hydrochlorothiazide, and Vitamin D (Ergocalciferol).  Meds ordered this encounter  Medications  . ALPRAZolam (XANAX) 0.5 MG tablet    Sig: Take 1 tablet (0.5 mg total) by mouth 3 (three) times daily as needed. for anxiety    Dispense:  270 tablet    Refill:  1  . Semaglutide (OZEMPIC) 0.25 or 0.5 MG/DOSE SOPN    Sig: Inject 0.5 mg into the skin once a week.    Dispense:  9 mL    Refill:  1  . Continuous Blood Gluc Receiver (FREESTYLE LIBRE READER) DEVI    Sig: 1 Act by Does not apply route daily.    Dispense:  1 Device    Refill:  11  . Continuous Blood Gluc Sensor (FREESTYLE LIBRE SENSOR SYSTEM) MISC    Sig: 1 Act by Does not apply route daily.    Dispense:  1 each    Refill:  11  . insulin degludec (TRESIBA FLEXTOUCH) 100 UNIT/ML SOPN FlexTouch Pen    Sig: Inject 0.3 mLs (30 Units total) into the skin daily at 10 pm.    Dispense:  9 mL    Refill:  1  . atorvastatin (LIPITOR) 10 MG tablet    Sig: Take 1 tablet (10 mg total) by mouth daily.    Dispense:  90 tablet    Refill:  1  . levothyroxine (SYNTHROID, LEVOTHROID) 50 MCG tablet    Sig: Take 1 tablet (50 mcg total) by mouth daily.    Dispense:  90 tablet    Refill:  0     Follow-up: Return in about 3 months (around 05/14/2018).  Scarlette Calico, MD

## 2018-02-12 ENCOUNTER — Encounter: Payer: Self-pay | Admitting: Internal Medicine

## 2018-02-12 LAB — TESTOSTERONE TOTAL,FREE,BIO, MALES
Albumin: 4.8 g/dL (ref 3.6–5.1)
Sex Hormone Binding: 48 nmol/L (ref 10–50)
Testosterone: 214 ng/dL — ABNORMAL LOW (ref 250–827)

## 2018-02-12 LAB — PROLACTIN: Prolactin: 5.8 ng/mL (ref 2.0–18.0)

## 2018-02-12 NOTE — Progress Notes (Signed)
[name] [place],  [address] Phone: [phone] Subjective:     CC: Bilateral leg pain  QPY:[id number]  Zachary Murray is a 52 y.o. male coming in with complaint of bilateral leg pain. Pain occurs daily in the lower leg. He feels on the days that he does not have a lot of energy his legs seem to hurt worse. He is unable to walk when the pain occurs. Worse with activity. Sitting down can alleviate his pain but not always.   Patient has had more of a lumbar radiculopathy previously but feels that this is different. Seems that cannot walk even short distances without pain.  Patient is concerned because he is feeling he is unable to do his jobs.      Past Medical History:  Diagnosis Date  . Hypertension   . OSA on CPAP   . Type II diabetes mellitus with manifestations [facility])    Past Surgical History:  Procedure Laterality Date  . COLONOSCOPY WITH PROPOFOL N/A 11/12/2016   Procedure: COLONOSCOPY WITH PROPOFOL;  Surgeon: Mauri Pole, MD;  Location: [facility];  Service: Endoscopy;  Laterality: N/A;  . WISDOM TOOTH EXTRACTION     Social History   Socioeconomic History  . Marital status: Single    Spouse name: Not on file  . Number of children: Not on file  . Years of education: Not on file  . Highest education level: Not on file  Occupational History  . Occupation: self employeed--- Glass blower/designer  Social Needs  . Financial resource strain: Not on file  . Food insecurity:    Worry: Not on file    Inability: Not on file  . Transportation needs:    Medical: Not on file    Non-medical: Not on file  Tobacco Use  . Smoking status: Never Smoker  . Smokeless tobacco: Current User    Types: Chew  Substance and Sexual Activity  . Alcohol use: No    Alcohol/week: 0.0 oz  . Drug use: No  . Sexual activity: Not Currently  Lifestyle  . Physical activity:    Days per week: Not on file    Minutes per session: Not on file    . Stress: Not on file  Relationships  . Social connections:    Talks on phone: Not on file    Gets together: Not on file    Attends religious service: Not on file    Active member of club or organization: Not on file    Attends meetings of clubs or organizations: Not on file    Relationship status: Not on file  Other Topics Concern  . Not on file  Social History Narrative  . Not on file   Allergies  Allergen Reactions  . Metformin And Related Diarrhea  . Wellbutrin [Bupropion] Other (See Comments)    tremors  . Lisinopril Cough   Family History  Problem Relation Age of Onset  . Colon cancer Father   . Hypertension Father   . Thyroid cancer Mother   . Colon polyps Mother   . Alcohol abuse Neg Hx   . COPD Neg Hx   . Diabetes Neg Hx   . Early death Neg Hx   . Hearing loss Neg Hx   . Heart disease Neg Hx   . Hyperlipidemia Neg Hx   . Kidney disease Neg Hx   . Stroke Neg Hx      Past medical history, social, surgical  and family history all reviewed in electronic medical record.  No pertanent information unless stated regarding to the chief complaint.   Review of Systems:Review of systems updated and as accurate as of 02/13/18  No headache, visual changes, nausea, vomiting, diarrhea, constipation, dizziness, abdominal pain, skin rash, fevers, chills, night sweats, weight loss, swollen lymph nodes, chest pain, shortness of breath, mood changes.  Positive muscle aches, body aches  Objective  Blood pressure 118/76, pulse 96, height 6' (1.829 m), weight (!) 399 lb (181 kg), SpO2 95 %. Systems examined below as of 02/13/18   General: No apparent distress alert and oriented x3 mood and affect normal, dressed appropriately.  HEENT: Pupils equal, extraocular movements intact  Respiratory: Patient's speak in full sentences and does not appear short of breath  Cardiovascular: 1+lower extremity edema, non tender, no erythema  Skin: Warm dry intact with no signs of infection or  rash on extremities or on axial skeleton.  Abdomen: Soft nontender  Neuro: Cranial nerves II through XII are intact, neurovascularly intact in all extremities with 2+ DTRs and 2+ pulses.  Lymph: No lymphadenopathy of posterior or anterior cervical chain or axillae bilaterally.  Gait antalgic MSK:  Non tender with full range of motion and good stability and symmetric strength and tone of shoulders, elbows, wrist, hip, knee and ankles bilaterally.   Patient's lower legs minimally discomfort to palpation.  Patient has less swelling than previous exam.  Significant varicose veins noted no.  Neurovascularly intact distally      Impression and Recommendations:     This case required medical decision making of moderate complexity.      Note: This dictation was prepared with Dragon dictation along with smaller phrase technology. Any transcriptional errors that result from this process are unintentional.

## 2018-02-13 ENCOUNTER — Ambulatory Visit: Payer: BLUE CROSS/BLUE SHIELD | Admitting: Family Medicine

## 2018-02-13 ENCOUNTER — Encounter: Payer: Self-pay | Admitting: Family Medicine

## 2018-02-13 VITALS — BP 118/76 | HR 96 | Ht 72.0 in | Wt 399.0 lb

## 2018-02-13 DIAGNOSIS — M79605 Pain in left leg: Secondary | ICD-10-CM

## 2018-02-13 DIAGNOSIS — M79604 Pain in right leg: Secondary | ICD-10-CM | POA: Diagnosis not present

## 2018-02-13 DIAGNOSIS — E559 Vitamin D deficiency, unspecified: Secondary | ICD-10-CM | POA: Diagnosis not present

## 2018-02-13 DIAGNOSIS — E291 Testicular hypofunction: Secondary | ICD-10-CM | POA: Diagnosis not present

## 2018-02-13 NOTE — Assessment & Plan Note (Signed)
Referred to endocrinology to discuss Clomid

## 2018-02-13 NOTE — Assessment & Plan Note (Signed)
Patient is having bilateral leg pain.  It seemed previously to be more of a lumbar radiculopathy but patient is fairly adamant that he feels that this is different.  Patient has been on Pletal and was making some different previously but feels like it is now making a difference and will titrate down over the course the next 2 weeks.  Differential includes early peripheral neuropathy, lumbar radiculopathy, hypothyroidism or potentially high oxalate or uric acid.  Patient will try over-the-counter medications, discontinue the medications were discussed.  Patient recently has been on 50 mg of thyroid medication since his TSH was 6.1.  Hopefully this will be beneficial.  Encouraged him to continue the vitamin D.  Follow-up with me again in 6 weeks.

## 2018-02-13 NOTE — Assessment & Plan Note (Signed)
Encouraged once weekly

## 2018-02-13 NOTE — Patient Instructions (Signed)
Good to see you  Overall I think we are close We will get endocrinology involved.  Continue everything else at this time  Pletal 1/2 tab 2 times a day for 2 weeks, then off Tart cherry extract any dose at night See me again in 6-8 weeks

## 2018-02-17 ENCOUNTER — Other Ambulatory Visit: Payer: Self-pay | Admitting: Internal Medicine

## 2018-02-17 DIAGNOSIS — Z794 Long term (current) use of insulin: Secondary | ICD-10-CM

## 2018-02-17 DIAGNOSIS — E118 Type 2 diabetes mellitus with unspecified complications: Secondary | ICD-10-CM

## 2018-02-17 MED ORDER — INSULIN PEN NEEDLE 32G X 6 MM MISC: 1.0000 | Freq: Every day | 1 refills | Status: DC

## 2018-02-18 ENCOUNTER — Telehealth: Payer: Self-pay

## 2018-02-18 MED ORDER — INSULIN PEN NEEDLE 31G X 6 MM MISC: 3 refills | Status: DC

## 2018-02-18 NOTE — Telephone Encounter (Signed)
[facility] sent communication that the novofine is on back order.  They are stating that they have the Relion Pen Needles 31G x 16mm.  Erx has been sent and the medication list has been updated.

## 2018-02-19 ENCOUNTER — Other Ambulatory Visit: Payer: Self-pay | Admitting: Internal Medicine

## 2018-02-19 ENCOUNTER — Ambulatory Visit: Payer: BLUE CROSS/BLUE SHIELD | Admitting: Internal Medicine

## 2018-02-19 DIAGNOSIS — E291 Testicular hypofunction: Secondary | ICD-10-CM

## 2018-02-23 ENCOUNTER — Encounter: Payer: Self-pay | Admitting: Family Medicine

## 2018-03-27 ENCOUNTER — Ambulatory Visit: Payer: BLUE CROSS/BLUE SHIELD | Admitting: Endocrinology

## 2018-03-27 ENCOUNTER — Encounter: Payer: Self-pay | Admitting: Endocrinology

## 2018-03-27 VITALS — BP 124/78 | HR 113 | Ht 72.0 in | Wt 392.4 lb

## 2018-03-27 DIAGNOSIS — R7989 Other specified abnormal findings of blood chemistry: Secondary | ICD-10-CM

## 2018-03-27 DIAGNOSIS — E1165 Type 2 diabetes mellitus with hyperglycemia: Secondary | ICD-10-CM | POA: Diagnosis not present

## 2018-03-27 DIAGNOSIS — R5383 Other fatigue: Secondary | ICD-10-CM

## 2018-03-27 DIAGNOSIS — E039 Hypothyroidism, unspecified: Secondary | ICD-10-CM | POA: Diagnosis not present

## 2018-03-27 NOTE — Progress Notes (Addendum)
Patient ID: Zachary Murray, male   DOB: February 23, 1966, 52 y.o.   MRN: [id number]           Reason for consultation: Endocrinology evaluation   Chief complaint: FATIGUE  History of Present Illness  PROBLEM 1  The patient has been referred by Dr. Scarlette Calico  Hypogonadismwas first diagnosed in 11/18  Since about 20 2016 17 he has had complaints offatigue which has gradually worsened He also feels malaise and listless, mostly in the afternoons and did not feel like doing anything at that time Also has some decreased libido Does not complain of daytime somnolence    There is no history of the following: Hot flushes, sweats, breast enlargement, long term anabolic steroid use, history of testicular injury mumps in childhood. No history of osteopenia.  He had a fracture of his tibia after twisting his foot about 15 years ago but no other fractures  Prior lab results showtestosterone levels as follows:  Free testosterone was not calculated on his recent lab work which was done around 9 AM  Lab Results  Component Value Date   TESTOSTERONE 214 (L) 02/11/2018   TESTOSTERONE 246.50 (L) 10/02/2017    Prolactin level: 5.8  No results found for: Hugo  He has not been on any treatment and is now referred you for further management   PROBLEM 2: DIABETES: Patient is requesting advice on management of his diabetes  Diagnosis date: ?  2016  Previous history:  Although he was diagnosed to have diabetes in 2016 he was not able to tolerate metformin that was previously prescribed Also was tried on [place] in 2017 and 2018 with some improved control He was tried on Bydureon in 06/2017 and continued until mil 3/19 d  Recent history:     Non-insulin hypoglycemic drugs:   Ozempic 0.5 mg weekly  Insulin regimen:        Tresiba 30 units daily  Current management, blood sugar patterns and problems identified:   He was started on Ozempic and Tresiba insulin in 01/2018 when his A1c  was 7.8 and fasting glucose 165  Prior to this he was taking Bydureon and glipizide but he had not been on any medication in 3/19 and had stopped Bydureon because of painful skin nodules   With the new regimen his blood sugars have been much better with consistent control throughout the day  Also he has been able to lose 7 pounds with somewhat decreased portions  He is also having tendency to mild hypoglycemia  Although he has not had any instructions about meal planning he thinks he is trying to eat healthy and knows what to do  Currently not exercising because of leg pain and fatigue  He tends to have lower blood sugars in the early afternoon, a couple of times in the 60s and 1 time midmorning       Monitors blood glucose:   Uses Freestyle libre, this was started in 01/2018         Blood Glucose readings from freestyle libre reader download:   Mean values apply above for all meters except median for One Touch  PRE-MEAL Fasting Lunch Dinner Bedtime Overall  Glucose range:  87-140      Mean/median:  101  99  100  104  98+/-20   POST-MEAL PC Breakfast PC Lunch PC Dinner  Glucose range:    89-131  Mean/median:    113    Side effects from medications:  Diarrhea from metformin  Hypoglycemia:  Occasionally mid to late afternoon with blood sugars in the 60s         Meals:  Usually low-fat        Physical activity: exercise: Minimal             Dietician visit: Most recent:     Never Weight control:  Wt Readings from Last 3 Encounters:  03/27/18 (!) 392 lb 6.4 oz (178 kg)  02/13/18 (!) 399 lb (181 kg)  02/11/18 (!) 400 lb (181.4 kg)   Lab Results  Component Value Date   HGBA1C 7.8 02/11/2018   HGBA1C 6.6 (H) 10/02/2017   HGBA1C 7.0 06/26/2017   Lab Results  Component Value Date   MICROALBUR 4.0 (H) 03/26/2017   LDLCALC 82 03/26/2017   CREATININE 0.80 02/11/2018    HYPOTHYROIDISM:  He was previously given levothyroxine for high TSH in 12/16 but later stopped  because of improved TSH  Because of his fatigue he has been started on levothyroxine in 3/19 with only slight improvement in his fatigue No symptoms of cold intolerance, hoarseness or unusual weight gain  Currently taking 50 mcg of levothyroxine, taking this consistently before breakfast Waiting for follow-up evaluation  No recent free T4 level available    Lab Results  Component Value Date   TSH 6.01 (H) 02/11/2018   TSH 3.79 10/02/2017   TSH 4.33 03/26/2017   FREET4 0.97 02/08/2016        Allergies as of 03/27/2018      Reactions   Metformin And Related Diarrhea   Wellbutrin [bupropion] Other (See Comments)   tremors   Lisinopril Cough      Medication List        Accurate as of 03/27/18  4:14 PM. Always use your most recent med list.          ALPRAZolam 0.5 MG tablet Commonly known as:  XANAX Take 1 tablet (0.5 mg total) by mouth 3 (three) times daily as needed. for anxiety   atorvastatin 10 MG tablet Commonly known as:  LIPITOR Take 1 tablet (10 mg total) by mouth daily.   cilostazol 50 MG tablet Commonly known as:  PLETAL Take 1 tablet (50 mg total) by mouth 2 (two) times daily.   FREESTYLE LIBRE READER Devi 1 Act by Does not apply route daily.   FREESTYLE LIBRE SENSOR SYSTEM Misc 1 Act by Does not apply route daily.   insulin degludec 100 UNIT/ML Sopn FlexTouch Pen Commonly known as:  TRESIBA FLEXTOUCH Inject 0.3 mLs (30 Units total) into the skin daily at 10 pm.   Insulin Pen Needle 31G X 6 MM Misc Commonly known as:  RELION PEN NEEDLES Use as needed for insulin injection.   levothyroxine 50 MCG tablet Commonly known as:  SYNTHROID, LEVOTHROID Take 1 tablet (50 mcg total) by mouth daily.   Melatonin 3-10 MG Tabs Take by mouth.   Semaglutide 0.25 or 0.5 MG/DOSE Sopn Commonly known as:  OZEMPIC Inject 0.5 mg into the skin once a week.   triamterene-hydrochlorothiazide 37.5-25 MG tablet Commonly known as:  MAXZIDE-25 Take 1 tablet  daily by mouth.   Vitamin D (Ergocalciferol) 50000 units Caps capsule Commonly known as:  DRISDOL TAKE 1 CAPSULE BY MOUTH EVERY 7 DAYS       Allergies:  Allergies  Allergen Reactions  . Metformin And Related Diarrhea  . Wellbutrin [Bupropion] Other (See Comments)    tremors  . Lisinopril Cough    Past Medical History:  Diagnosis Date  .  Hypertension   . OSA on CPAP   . Type II diabetes mellitus with manifestations [facility])     Past Surgical History:  Procedure Laterality Date  . COLONOSCOPY WITH PROPOFOL N/A 11/12/2016   Procedure: COLONOSCOPY WITH PROPOFOL;  Surgeon: Mauri Pole, MD;  Location: [facility];  Service: Endoscopy;  Laterality: N/A;  . WISDOM TOOTH EXTRACTION      Family History  Problem Relation Age of Onset  . Colon cancer Father   . Hypertension Father   . Thyroid cancer Mother   . Colon polyps Mother   . Thyroid disease Mother   . Thyroid disease Sister   . Thyroid disease Maternal Grandmother   . Alcohol abuse Neg Hx   . COPD Neg Hx   . Diabetes Neg Hx   . Early death Neg Hx   . Hearing loss Neg Hx   . Heart disease Neg Hx   . Hyperlipidemia Neg Hx   . Kidney disease Neg Hx   . Stroke Neg Hx     Social History:  reports that he has never smoked. His smokeless tobacco use includes chew. He reports that he does not drink alcohol or use drugs.  Review of Systems  Constitutional: Positive for malaise. Negative for reduced appetite and diaphoresis.  HENT: Negative for hoarseness.        Has had mild recurrent chronic headaches, not worse recently  Eyes: Negative for blurred vision.  Respiratory: Negative for daytime sleepiness and shortness of breath.   Cardiovascular: Negative for chest pain and leg swelling.  Gastrointestinal: Negative for constipation.  Endocrine: Positive for fatigue and erectile dysfunction.       Mild erectile dysfunction but currently are 6 and reactive  Genitourinary: Negative for frequency.  Musculoskeletal:  Positive for muscle aches.       He has pains in his legs, mostly on the calf and outer legs which is more on walking but also present at rest.  Being treated by sports medicine  Skin: Negative for dry skin.  Neurological: Positive for tremors. Negative for numbness and tingling.     Wt Readings from Last 3 Encounters:  03/27/18 (!) 392 lb 6.4 oz (178 kg)  02/13/18 (!) 399 lb (181 kg)  02/11/18 (!) 400 lb (181.4 kg)   LIPIDS:  Lab Results  Component Value Date   CHOL 158 03/26/2017   HDL 65.70 03/26/2017   LDLCALC 82 03/26/2017   TRIG 49.0 03/26/2017   CHOLHDL 2 03/26/2017    General Examination:   BP 124/78 (BP Location: Right Arm, Patient Position: Sitting, Cuff Size: Large)   Pulse (!) 113   Ht 6' (1.829 m)   Wt (!) 392 lb 6.4 oz (178 kg)   SpO2 98%   BMI 53.22 kg/m   GENERAL APPEARANCEmarked generalized obesity present.  SKIN:normal, no rash or pigmentation.  HEENT:Oral mucosa normal.    EYES:normal external appearance of eyes, Fundii benign.    NECK:no lymphadenopathy, no thyromegaly.   CHEST: Gynecomastia present bilaterally, more on the right side and glandular tissue measures about 2 inches  LUNGS:clear to auscultation bilaterally, no wheezes, rhonchi, rales.  HEART:normal S1 And S2, no S3, S4, murmur or click.  ABDOMEN:no hepatosplenomegaly, no mass palpated, soft and not distended  MALE GENITOURINARY:left testicle 3 cm and right testicle 3 cm.   MUSCULOSKELETALNo enlargement or deformity of joints.  EXTREMITIES:no clubbing, no edema. No skin lesions on plantar surfaces of the feet   NEUROLOGIC EXAM: Biceps reflexes normal (2+) bilaterally. No  decrease in distal monofilament sensation or pulses in the feet  Assessment/ Plan:  Patient has multiple and complex problems that were addressed as follows  ?  HYPOGONADISM with symptoms of fatigue and objective signs of mild gynecomastia  and reduced testicular size  Although his total testosterone is low because of his severe obesity we need to make sure that he is truly having a low testosterone level based on free testosterone assay which has not been done from the current lab  Etiology is likely related to insulin resistance syndrome but needs to be confirmed Also need to establish whether this is primary or secondary with LH level Explained to the patient the details of the test, work-up involved and possible etiologies If confirmed he will need to be treated  Discussed various options for testosterone supplementation including transdermal gel or liquid preparations, Androderm patch, testosterone injections and clomiphene.  Discussed pros and cons of various treatments  DIABETES with morbid obesity  See history of present illness for detailed discussion of current diabetes management, blood sugar patterns and problems identified His baseline A1c recently was 7.8 on no treatment  Currently on a regimen of Ozempic 0.5 mg and basal insulin With this his blood sugars are practically normal with some tendency to hypoglycemia Patient has very little information about diabetes as a disease process, blood sugar targets, how his medications are working, needing a formal meal plan, exercise regimen and A1c targets  RECOMMENDATIONS: Since he very likely can good control with his Ozempic 0.5 mg dose that can bring A1c down by about 1.5% he most likely does not need basal insulin in addition He has improved with diet and is losing weight and is willing to try Ozempic alone without adding basal insulin for now Recommended consultation with dietitian and this will be arranged Fructosamine will be checked to evaluate recent level of control   HYPOTHYROIDISM: Although his fatigue is significant and likely not be all related to hypothyroidism he has had slight improvement in his fatigue with taking levothyroxine supplementation for  baseline TSH of 6 Objectively looks euthyroid No goiter on exam  However need to rule out secondary hypothyroidism since his free T4 is not available recently Also need follow-up TSH to adjust his dosage Because of his strong family history it is likely to be autoimmune related and may need long-term treatment  HYPERCHOLESTEROLEMIA: On Lipitor for treatment but needs follow-up with level to be done, will defer to PCP  A copy of the consultation note has been sent to the referring physician   Elayne Snare 03/27/2018, 4:14 PM     Note: This office note was prepared with Dragon voice recognition system technology. Any transcriptional errors that result from this process are unintentional.   Addendum:  Free testosterone is low and he will start clomiphene 25 mg 3 times a week  Elayne Snare 03/27/2018, 5:00 PM

## 2018-03-27 NOTE — Patient Instructions (Signed)
Stop insulin  Check blood sugars on waking up 3-4/7   Also check blood sugars about 2 hours after a meal and do this after different meals by rotation  Recommended blood sugar levels on waking up is 80-120 and about 2 hours after meal is 120-160  Please bring your blood sugar monitor to each visit, thank you

## 2018-03-30 ENCOUNTER — Other Ambulatory Visit (INDEPENDENT_AMBULATORY_CARE_PROVIDER_SITE_OTHER): Payer: BLUE CROSS/BLUE SHIELD

## 2018-03-30 DIAGNOSIS — E1165 Type 2 diabetes mellitus with hyperglycemia: Secondary | ICD-10-CM

## 2018-03-30 DIAGNOSIS — R7989 Other specified abnormal findings of blood chemistry: Secondary | ICD-10-CM

## 2018-03-30 DIAGNOSIS — E039 Hypothyroidism, unspecified: Secondary | ICD-10-CM | POA: Diagnosis not present

## 2018-03-30 LAB — T4, FREE: Free T4: 0.92 ng/dL (ref 0.60–1.60)

## 2018-03-30 LAB — MICROALBUMIN / CREATININE URINE RATIO
Creatinine,U: 144.8 mg/dL
Microalb Creat Ratio: 2.4 mg/g (ref 0.0–30.0)
Microalb, Ur: 3.5 mg/dL — ABNORMAL HIGH (ref 0.0–1.9)

## 2018-03-30 LAB — TSH: TSH: 2.74 u[IU]/mL (ref 0.35–4.50)

## 2018-03-30 LAB — LUTEINIZING HORMONE: LH: 6.29 m[IU]/mL (ref 1.50–9.30)

## 2018-03-30 LAB — GLUCOSE, RANDOM: Glucose, Bld: 107 mg/dL — ABNORMAL HIGH (ref 70–99)

## 2018-03-31 ENCOUNTER — Encounter (INDEPENDENT_AMBULATORY_CARE_PROVIDER_SITE_OTHER): Payer: Self-pay

## 2018-03-31 LAB — TESTOSTERONE, FREE, TOTAL, SHBG
Sex Hormone Binding: 57.9 nmol/L (ref 19.3–76.4)
Testosterone, Free: 6.6 pg/mL — ABNORMAL LOW (ref 7.2–24.0)
Testosterone: 267 ng/dL (ref 264–916)

## 2018-03-31 LAB — FRUCTOSAMINE: Fructosamine: 242 umol/L (ref 0–285)

## 2018-03-31 MED ORDER — CLOMIPHENE CITRATE 50 MG PO TABS: 25.0000 mg | ORAL_TABLET | ORAL | 1 refills | Status: DC

## 2018-03-31 NOTE — Addendum Note (Signed)
Addended by: Elayne Snare on: 03/31/2018 12:16 PM   Modules accepted: Orders

## 2018-04-17 ENCOUNTER — Other Ambulatory Visit: Payer: Self-pay | Admitting: Internal Medicine

## 2018-04-17 DIAGNOSIS — E038 Other specified hypothyroidism: Secondary | ICD-10-CM

## 2018-04-24 ENCOUNTER — Other Ambulatory Visit: Payer: Self-pay | Admitting: Family Medicine

## 2018-04-27 NOTE — Telephone Encounter (Signed)
Refill done.  

## 2018-05-03 ENCOUNTER — Encounter: Payer: Self-pay | Admitting: Endocrinology

## 2018-05-03 ENCOUNTER — Encounter: Payer: Self-pay | Admitting: Family Medicine

## 2018-05-04 ENCOUNTER — Other Ambulatory Visit: Payer: Self-pay

## 2018-05-04 ENCOUNTER — Other Ambulatory Visit: Payer: Self-pay | Admitting: Endocrinology

## 2018-05-04 DIAGNOSIS — E1165 Type 2 diabetes mellitus with hyperglycemia: Secondary | ICD-10-CM

## 2018-05-04 DIAGNOSIS — E118 Type 2 diabetes mellitus with unspecified complications: Secondary | ICD-10-CM

## 2018-05-04 MED ORDER — FREESTYLE LIBRE SENSOR SYSTEM MISC: 1.0000 | Freq: Every day | 11 refills | Status: DC

## 2018-05-14 ENCOUNTER — Other Ambulatory Visit (INDEPENDENT_AMBULATORY_CARE_PROVIDER_SITE_OTHER): Payer: BLUE CROSS/BLUE SHIELD

## 2018-05-14 DIAGNOSIS — E1165 Type 2 diabetes mellitus with hyperglycemia: Secondary | ICD-10-CM | POA: Diagnosis not present

## 2018-05-14 LAB — BASIC METABOLIC PANEL
BUN: 12 mg/dL (ref 6–23)
CO2: 32 mEq/L (ref 19–32)
Calcium: 10 mg/dL (ref 8.4–10.5)
Chloride: 99 mEq/L (ref 96–112)
Creatinine, Ser: 0.86 mg/dL (ref 0.40–1.50)
GFR: 98.33 mL/min (ref >60.00)
Glucose, Bld: 121 mg/dL — ABNORMAL HIGH (ref 70–99)
Potassium: 3.3 mEq/L — ABNORMAL LOW (ref 3.5–5.1)
Sodium: 139 mEq/L (ref 135–145)

## 2018-05-14 LAB — LIPID PANEL
Cholesterol: 157 mg/dL (ref 0–200)
HDL: 41.4 mg/dL (ref >39.00)
LDL Cholesterol: 99 mg/dL (ref 0–99)
NonHDL: 115.28
Total CHOL/HDL Ratio: 4
Triglycerides: 79 mg/dL (ref 0.0–149.0)
VLDL: 15.8 mg/dL (ref 0.0–40.0)

## 2018-05-14 LAB — HEMOGLOBIN A1C: Hgb A1c MFr Bld: 6 % (ref 4.6–6.5)

## 2018-05-20 ENCOUNTER — Other Ambulatory Visit (INDEPENDENT_AMBULATORY_CARE_PROVIDER_SITE_OTHER): Payer: BLUE CROSS/BLUE SHIELD

## 2018-05-20 ENCOUNTER — Encounter: Payer: Self-pay | Admitting: Endocrinology

## 2018-05-20 ENCOUNTER — Ambulatory Visit (INDEPENDENT_AMBULATORY_CARE_PROVIDER_SITE_OTHER): Payer: BLUE CROSS/BLUE SHIELD | Admitting: Endocrinology

## 2018-05-20 VITALS — BP 120/76 | HR 87 | Ht 72.0 in | Wt 387.0 lb

## 2018-05-20 DIAGNOSIS — E669 Obesity, unspecified: Secondary | ICD-10-CM

## 2018-05-20 DIAGNOSIS — E039 Hypothyroidism, unspecified: Secondary | ICD-10-CM | POA: Diagnosis not present

## 2018-05-20 DIAGNOSIS — R7989 Other specified abnormal findings of blood chemistry: Secondary | ICD-10-CM | POA: Diagnosis not present

## 2018-05-20 DIAGNOSIS — E1169 Type 2 diabetes mellitus with other specified complication: Secondary | ICD-10-CM | POA: Diagnosis not present

## 2018-05-20 DIAGNOSIS — R5383 Other fatigue: Secondary | ICD-10-CM | POA: Diagnosis not present

## 2018-05-20 DIAGNOSIS — E291 Testicular hypofunction: Secondary | ICD-10-CM | POA: Diagnosis not present

## 2018-05-20 LAB — CBC WITH DIFFERENTIAL/PLATELET
Basophils Absolute: 0 10*3/uL (ref 0.0–0.1)
Basophils Relative: 0.4 % (ref 0.0–3.0)
Eosinophils Absolute: 0.3 10*3/uL (ref 0.0–0.7)
Eosinophils Relative: 4.4 % (ref 0.0–5.0)
HCT: 46.3 % (ref 39.0–52.0)
Hemoglobin: 16.2 g/dL (ref 13.0–17.0)
Lymphocytes Relative: 19.6 % (ref 12.0–46.0)
Lymphs Abs: 1.3 10*3/uL (ref 0.7–4.0)
MCHC: 35 g/dL (ref 30.0–36.0)
MCV: 86.9 fl (ref 78.0–100.0)
Monocytes Absolute: 0.7 10*3/uL (ref 0.1–1.0)
Monocytes Relative: 10.2 % (ref 3.0–12.0)
Neutro Abs: 4.2 10*3/uL (ref 1.4–7.7)
Neutrophils Relative %: 65.4 % (ref 43.0–77.0)
Platelets: 222 10*3/uL (ref 150.0–400.0)
RBC: 5.33 Mil/uL (ref 4.22–5.81)
RDW: 13.8 % (ref 11.5–15.5)
WBC: 6.5 10*3/uL (ref 4.0–10.5)

## 2018-05-20 LAB — TSH: TSH: 4.24 u[IU]/mL (ref 0.35–4.50)

## 2018-05-20 LAB — VITAMIN B12: Vitamin B-12: 709 pg/mL (ref 211–911)

## 2018-05-20 LAB — LUTEINIZING HORMONE: LH: 20.04 m[IU]/mL — ABNORMAL HIGH (ref 1.50–9.30)

## 2018-05-20 NOTE — Progress Notes (Signed)
Patient ID: Zachary Murray, male   DOB: 1966-04-11, 52 y.o.   MRN: [id number]           Reason for visit: Endocrinology follow-up   Chief complaint: FATIGUE  History of Present Illness  PROBLEM 1  Hypogonadismwas first diagnosed in 11/18  Since about 2016 or 17 he has had complaints offatigue which has gradually worsened He also feels malaise and listless, mostly in the afternoons and did not feel like doing anything at that time Also has some decreased libido  The diagnosis was confirmed by the low free T level of 6.6 done in 5/19 [facility] was inappropriately low at 6.3 He is now on a trial of CLOMIPHENE 25 mg 3 times a week  With this his energy level is improving although he still gets tired and has no motivation in the afternoon Libido may be slightly better  Lab Results  Component Value Date   TESTOSTERONE 267 03/30/2018   TESTOSTERONE 214 (L) 02/11/2018   TESTOSTERONE 246.50 (L) 10/02/2017    Prolactin level: 5.8  Lab Results  Component Value Date   LH 6.29 03/30/2018       PROBLEM 2: DIABETES: None  Diagnosis date: ?  2016  Previous history:  Although he was diagnosed to have diabetes in 2016 he was not able to tolerate metformin that was previously prescribed Also was tried on Tracy in 2017 and 2018 with some improved control He was tried on Bydureon in 06/2017 and continued until mil 3/19 d  Recent history:     Non-insulin hypoglycemic drugs:   Ozempic 0.5 mg weekly  Insulin regimen:   None  Current management, blood sugar patterns and problems identified:   He was started on Ozempic and Tresiba insulin in 01/2018 by his PCP when his A1c was 7.8 and fasting glucose 165  Prior to this he was taking Bydureon and glipizide but he had not been on any medication in 3/19  Insulin was stopped in 5/19 since his blood sugar was averaging only 92 on his freestyle sensor at home  As outlined below his blood sugars are excellent still  He has started  walking  He has lost some weight  Also postprandial readings are not usually over 180  He is usually following a low-fat diet       Monitors blood glucose:   Uses Freestyle libre, this was started in 01/2018         Blood Glucose readings from freestyle libre reader download:   AVERAGE 122 with only 1% of readings over 180 and average blood sugar at any given day between 107 up to 136, highest after supper when he is not taking as much FASTING blood sugar as low as 87 up to 142  Side effects from medications:  Diarrhea from metformin   Hypoglycemia:  Occasionally mid to late afternoon with blood sugars in the 60s         Meals:  Usually low-fat        Physical activity: exercise: walk             Dietician visit: Most recent:     Never Weight control:  Wt Readings from Last 3 Encounters:  05/20/18 (!) 387 lb (175.5 kg)  03/27/18 (!) 392 lb 6.4 oz (178 kg)  02/13/18 (!) 399 lb (181 kg)   Lab Results  Component Value Date   HGBA1C 6.0 05/14/2018   HGBA1C 7.8 02/11/2018   HGBA1C 6.6 (H) 10/02/2017   Lab Results  Component Value Date   MICROALBUR 3.5 (H) 03/30/2018   LDLCALC 99 05/14/2018   CREATININE 0.86 05/14/2018    HYPOTHYROIDISM:  He was previously given levothyroxine for high TSH in 12/16 but later stopped because of improved TSH  Because of his fatigue he has been started on levothyroxine in 3/19 with only slight improvement in his fatigue He still complains of fatigue  He has been taking 50 mcg of levothyroxine, taking this consistently before breakfast  Follow-up labs on this supplement have been normal including free T4    Lab Results  Component Value Date   TSH 2.74 03/30/2018   TSH 6.01 (H) 02/11/2018   TSH 3.79 10/02/2017   FREET4 0.92 03/30/2018   FREET4 0.97 02/08/2016        Allergies as of 05/20/2018      Reactions   Metformin And Related Diarrhea   Wellbutrin [bupropion] Other (See Comments)   tremors   Lisinopril Cough        Medication List        Accurate as of 05/20/18  8:21 AM. Always use your most recent med list.          ALPRAZolam 0.5 MG tablet Commonly known as:  XANAX Take 1 tablet (0.5 mg total) by mouth 3 (three) times daily as needed. for anxiety   cilostazol 50 MG tablet Commonly known as:  PLETAL Take 1 tablet (50 mg total) by mouth 2 (two) times daily.   clomiPHENE 50 MG tablet Commonly known as:  CLOMID Take 0.5 tablets (25 mg total) by mouth 3 (three) times a week.   FREESTYLE LIBRE READER Devi 1 Act by Does not apply route daily.   FREESTYLE LIBRE SENSOR SYSTEM Misc 1 Act by Does not apply route daily.   Insulin Pen Needle 31G X 6 MM Misc Commonly known as:  RELION PEN NEEDLES Use as needed for insulin injection.   levothyroxine 50 MCG tablet Commonly known as:  SYNTHROID, LEVOTHROID TAKE 1 TABLET BY MOUTH ONCE DAILY   Melatonin 3-10 MG Tabs Take by mouth.   Semaglutide 0.25 or 0.5 MG/DOSE Sopn Commonly known as:  OZEMPIC Inject 0.5 mg into the skin once a week.   triamterene-hydrochlorothiazide 37.5-25 MG tablet Commonly known as:  MAXZIDE-25 Take 1 tablet daily by mouth.   Vitamin D (Ergocalciferol) 50000 units Caps capsule Commonly known as:  DRISDOL TAKE 1 CAPSULE BY MOUTH ONCE A WEEK       Allergies:  Allergies  Allergen Reactions  . Metformin And Related Diarrhea  . Wellbutrin [Bupropion] Other (See Comments)    tremors  . Lisinopril Cough    Past Medical History:  Diagnosis Date  . Hypertension   . OSA on CPAP   . Type II diabetes mellitus with manifestations [facility])     Past Surgical History:  Procedure Laterality Date  . COLONOSCOPY WITH PROPOFOL N/A 11/12/2016   Procedure: COLONOSCOPY WITH PROPOFOL;  Surgeon: Mauri Pole, MD;  Location: [facility];  Service: Endoscopy;  Laterality: N/A;  . WISDOM TOOTH EXTRACTION      Family History  Problem Relation Age of Onset  . Colon cancer Father   . Hypertension Father   . Thyroid  cancer Mother   . Colon polyps Mother   . Thyroid disease Mother   . Thyroid disease Sister   . Thyroid disease Maternal Grandmother   . Alcohol abuse Neg Hx   . COPD Neg Hx   . Diabetes Neg Hx   . Early  death Neg Hx   . Hearing loss Neg Hx   . Heart disease Neg Hx   . Hyperlipidemia Neg Hx   . Kidney disease Neg Hx   . Stroke Neg Hx     Social History:  reports that he has never smoked. His smokeless tobacco use includes chew. He reports that he does not drink alcohol or use drugs.  Review of Systems    LIPIDS: Currently not on medications  Lab Results  Component Value Date   CHOL 157 05/14/2018   HDL 41.40 05/14/2018   LDLCALC 99 05/14/2018   TRIG 79.0 05/14/2018   CHOLHDL 4 05/14/2018    General Examination:   BP 120/76 (BP Location: Left Arm, Patient Position: Sitting, Cuff Size: Large)   Pulse 87   Ht 6' (1.829 m)   Wt (!) 387 lb (175.5 kg)   SpO2 97%   BMI 52.49 kg/m   No pedal edema present  Assessment/ Plan:   HYPOGONADOTROPIC HYPOGONADISM with symptoms of fatigue and low free testosterone at baseline  He has been symptomatic for quite some time but now with a trial of clomiphene 25 mg 3 times a week he does feel less fatigue and overall feels stronger but he feels that his energy level is still still low especially in the afternoon Testosterone level needs to be checked in follow-up for further evaluation  FATIGUE: We will also check CBC and B12  DIABETES with morbid obesity  See history of present illness for detailed discussion of current diabetes management, blood sugar patterns and problems identified His baseline A1c was 7.8 and now excellent at 6.0 with fairly good blood sugars at home  He is doing well with Ozempic along with some increased exercise now and resulting weight loss He will continue the same regimen He will try to be consistent with diet with carbohydrate and fat moderation and watch more readings after meals especially after  supper on his sensor  Consider consultation with dietitian  HYPOTHYROIDISM: Mild and last TSH was normal  HYPERTENSION and hypokalemia: He is on Maxide for edema also and he will discuss his low potassium with PCP   Follow-up in 2 months unless no medication changes made  Total visit time for evaluation and management of multiple problems and counseling =25 minutes   Elayne Snare 05/20/2018, 8:21 AM     Note: This office note was prepared with Dragon voice recognition system technology. Any transcriptional errors that result from this process are unintentional.     Elayne Snare 05/20/2018, 8:21 AM

## 2018-05-20 NOTE — Addendum Note (Signed)
Addended by: Kaylyn Lim I on: 05/20/2018 01:37 PM   Modules accepted: Orders

## 2018-05-22 ENCOUNTER — Telehealth: Payer: Self-pay | Admitting: Endocrinology

## 2018-05-22 NOTE — Telephone Encounter (Signed)
He was supposed to have a free testosterone test done at [facility], report not available.  Please check with [facility] to see if this has been processed

## 2018-05-22 NOTE — Telephone Encounter (Signed)
Called [facility] custopmer service in regards to the Testosterone and the rep, Lovena Le, stated that the test typically takes 3-6 days to complete and it is still pending.

## 2018-05-25 LAB — TESTOSTERONE, TOTAL, LC/MS/MS: Testosterone, total: 660.7 ng/dL (ref 264.0–916.0)

## 2018-05-26 ENCOUNTER — Other Ambulatory Visit: Payer: Self-pay

## 2018-05-26 MED ORDER — CLOMIPHENE CITRATE 50 MG PO TABS: 25.0000 mg | ORAL_TABLET | ORAL | 1 refills | Status: DC

## 2018-05-26 NOTE — Progress Notes (Signed)
[name] [address] [place], [place] [address] Phone: [phone] Subjective:    I'm seeing this patient by the request  of:    CC: Leg pain  [id number]  Zachary Murray is a 51 y.o. male coming in with complaint of legpain. Bilateral. States he's having an issue with fatigue in his legs.  Patient had seen me previously and was found to have a low testosterone as well as thyroid.  Patient has been seeing endocrinology and states that he has made improvement with Clomid.  Continues Pletal for some vascular compromise of the lower extremities.  Everything seems to be making improvement but still not all the way there yet.  Patient is trying to lose weight and try to stay active.  Patient is very motivated to continue to work harder but feels like he is still weaker than he should be.     Past Medical History:  Diagnosis Date  . Hypertension   . OSA on CPAP   . Type II diabetes mellitus with manifestations [facility])    Past Surgical History:  Procedure Laterality Date  . COLONOSCOPY WITH PROPOFOL N/A 11/12/2016   Procedure: COLONOSCOPY WITH PROPOFOL;  Surgeon: Mauri Pole, MD;  Location: [facility];  Service: Endoscopy;  Laterality: N/A;  . WISDOM TOOTH EXTRACTION     Social History   Socioeconomic History  . Marital status: Single    Spouse name: Not on file  . Number of children: Not on file  . Years of education: Not on file  . Highest education level: Not on file  Occupational History  . Occupation: self employeed--- Glass blower/designer  Social Needs  . Financial resource strain: Not on file  . Food insecurity:    Worry: Not on file    Inability: Not on file  . Transportation needs:    Medical: Not on file    Non-medical: Not on file  Tobacco Use  . Smoking status: Never Smoker  . Smokeless tobacco: Current User    Types: Chew  Substance and Sexual Activity  . Alcohol use: No    Alcohol/week: 0.0 oz  . Drug use: No  . Sexual  activity: Not Currently  Lifestyle  . Physical activity:    Days per week: Not on file    Minutes per session: Not on file  . Stress: Not on file  Relationships  . Social connections:    Talks on phone: Not on file    Gets together: Not on file    Attends religious service: Not on file    Active member of club or organization: Not on file    Attends meetings of clubs or organizations: Not on file    Relationship status: Not on file  Other Topics Concern  . Not on file  Social History Narrative  . Not on file   Allergies  Allergen Reactions  . Metformin And Related Diarrhea  . Wellbutrin [Bupropion] Other (See Comments)    tremors  . Lisinopril Cough   Family History  Problem Relation Age of Onset  . Colon cancer Father   . Hypertension Father   . Thyroid cancer Mother   . Colon polyps Mother   . Thyroid disease Mother   . Thyroid disease Sister   . Thyroid disease Maternal Grandmother   . Alcohol abuse Neg Hx   . COPD Neg Hx   . Diabetes Neg Hx   . Early death Neg Hx   . Hearing  loss Neg Hx   . Heart disease Neg Hx   . Hyperlipidemia Neg Hx   . Kidney disease Neg Hx   . Stroke Neg Hx      Past medical history, social, surgical and family history all reviewed in electronic medical record.  No pertanent information unless stated regarding to the chief complaint.   Review of Systems:Review of systems updated and as accurate as of 05/27/18  No headache, visual changes, nausea, vomiting, diarrhea, constipation, dizziness, abdominal pain, skin rash, fevers, chills, night sweats, weight loss, swollen lymph nodes,, chest pain, shortness of breath, mood changes.  Positive fatigue, muscle aches and body aches  Objective  Blood pressure 134/82, pulse (!) 106, height 6' (1.829 m), weight (!) 392 lb (177.8 kg), SpO2 97 %. Systems examined below as of 05/27/18   General: No apparent distress alert and oriented x3 mood and affect normal, dressed appropriately.  HEENT: Pupils  equal, extraocular movements intact  Respiratory: Patient's speak in full sentences and does not appear short of breath  Cardiovascular: Trace lower extremity edema, non tender, no erythema  Skin: Warm dry intact with no signs of infection or rash on extremities or on axial skeleton.  Abdomen: Soft nontender morbidly obese Neuro: Cranial nerves II through XII are intact, neurovascularly intact in all extremities with 2+ DTRs and 2+ pulses.  Lymph: No lymphadenopathy of posterior or anterior cervical chain or axillae bilaterally.  Gait normal with good balance and coordination.  MSK:  tender with full range of motion and good stability and symmetric strength and tone of shoulders, elbows, wrist, hip, and ankles bilaterally.  Due to patient's body habitus difficult to assess.  Bilateral knee arthritis noted with some mild varus deformity.    Impression and Recommendations:     This case required medical decision making of moderate complexity.      Note: This dictation was prepared with Dragon dictation along with smaller phrase technology. Any transcriptional errors that result from this process are unintentional.

## 2018-05-27 ENCOUNTER — Ambulatory Visit: Payer: BLUE CROSS/BLUE SHIELD | Admitting: Family Medicine

## 2018-05-27 ENCOUNTER — Encounter: Payer: Self-pay | Admitting: Family Medicine

## 2018-05-27 VITALS — BP 134/82 | HR 106 | Ht 72.0 in | Wt 392.0 lb

## 2018-05-27 DIAGNOSIS — R5383 Other fatigue: Secondary | ICD-10-CM | POA: Diagnosis not present

## 2018-05-27 DIAGNOSIS — M2550 Pain in unspecified joint: Secondary | ICD-10-CM | POA: Diagnosis not present

## 2018-05-27 DIAGNOSIS — E559 Vitamin D deficiency, unspecified: Secondary | ICD-10-CM

## 2018-05-27 DIAGNOSIS — E039 Hypothyroidism, unspecified: Secondary | ICD-10-CM

## 2018-05-27 DIAGNOSIS — E291 Testicular hypofunction: Secondary | ICD-10-CM

## 2018-05-27 MED ORDER — LEVOTHYROXINE SODIUM 75 MCG PO TABS
75.0000 ug | ORAL_TABLET | Freq: Every day | ORAL | 3 refills | Status: DC
Start: 2018-05-27 — End: 2019-05-28

## 2018-05-27 NOTE — Assessment & Plan Note (Signed)
Due to continued patient's numbers continue to be somewhat elevated with TSH patient will be increased to 75 mcg.

## 2018-05-27 NOTE — Assessment & Plan Note (Signed)
Likely multifactorial with patient's thyroid, obesity, possible other medications.

## 2018-05-27 NOTE — Patient Instructions (Addendum)
Good to see you  We will get lab of the thyroid next time you see Dwyane Dee with the higher dose of thyroid medicine.  I think try the clomid lower and have testosterone rechecked in 8ish weeks and make sure you are on the right dose.  We can consider vascular if we need but I am in agreement with you and think you will do well  Send me a message in 1 month and tell me how you are doing.

## 2018-05-27 NOTE — Assessment & Plan Note (Signed)
Continue the once weekly vitamin D.  It is helping with muscle strength and endurance

## 2018-05-27 NOTE — Assessment & Plan Note (Signed)
Doing well with Clomid but patient will follow-up with endocrinologist and will have recheck in 8 weeks with him decreasing dose.

## 2018-06-01 ENCOUNTER — Ambulatory Visit: Payer: BLUE CROSS/BLUE SHIELD | Admitting: Internal Medicine

## 2018-07-03 DIAGNOSIS — G4733 Obstructive sleep apnea (adult) (pediatric): Secondary | ICD-10-CM | POA: Diagnosis not present

## 2018-07-21 ENCOUNTER — Other Ambulatory Visit (INDEPENDENT_AMBULATORY_CARE_PROVIDER_SITE_OTHER): Payer: BLUE CROSS/BLUE SHIELD

## 2018-07-21 DIAGNOSIS — E291 Testicular hypofunction: Secondary | ICD-10-CM

## 2018-07-21 DIAGNOSIS — E039 Hypothyroidism, unspecified: Secondary | ICD-10-CM | POA: Diagnosis not present

## 2018-07-21 LAB — T4, FREE: Free T4: 0.82 ng/dL (ref 0.60–1.60)

## 2018-07-21 LAB — TESTOSTERONE: Testosterone: 516 ng/dL (ref 300.00–890.00)

## 2018-07-23 ENCOUNTER — Encounter: Payer: Self-pay | Admitting: Internal Medicine

## 2018-07-23 ENCOUNTER — Encounter: Payer: Self-pay | Admitting: Family Medicine

## 2018-07-23 DIAGNOSIS — Z1211 Encounter for screening for malignant neoplasm of colon: Secondary | ICD-10-CM

## 2018-07-23 DIAGNOSIS — Z1212 Encounter for screening for malignant neoplasm of rectum: Secondary | ICD-10-CM

## 2018-07-23 NOTE — Telephone Encounter (Signed)
Pt needs an appt

## 2018-07-24 ENCOUNTER — Ambulatory Visit: Payer: BLUE CROSS/BLUE SHIELD | Admitting: Endocrinology

## 2018-07-24 ENCOUNTER — Encounter: Payer: Self-pay | Admitting: Endocrinology

## 2018-07-24 VITALS — BP 126/80 | HR 94 | Ht 72.0 in | Wt 393.0 lb

## 2018-07-24 DIAGNOSIS — E669 Obesity, unspecified: Secondary | ICD-10-CM

## 2018-07-24 DIAGNOSIS — E291 Testicular hypofunction: Secondary | ICD-10-CM | POA: Diagnosis not present

## 2018-07-24 DIAGNOSIS — E039 Hypothyroidism, unspecified: Secondary | ICD-10-CM

## 2018-07-24 DIAGNOSIS — E1169 Type 2 diabetes mellitus with other specified complication: Secondary | ICD-10-CM

## 2018-07-24 DIAGNOSIS — Z23 Encounter for immunization: Secondary | ICD-10-CM | POA: Diagnosis not present

## 2018-07-24 NOTE — Progress Notes (Signed)
Patient ID: Zachary Murray, male   DOB: January 21, 1966, 52 y.o.   MRN: [id number]           Reason for visit: Endocrinology follow-up   Chief complaint: FATIGUE  History of Present Illness  PROBLEM 1  Hypogonadismwas first diagnosed in 11/18  Since about 2016 or 17 he has had complaints offatigue which has gradually worsened He also feels malaise and listless, mostly in the afternoons and did not feel like doing anything at that time Also has some decreased libido  The diagnosis was confirmed by the low free T level of 6.6 done in 5/19 [facility] was inappropriately low at 6.3 He is now on a trial of CLOMIPHENE 25 mg 2 times a week  With this his energy level was initially improving However he thinks he has only 50% improvement and his fatigue is fairly significant currently This is despite his testosterone level being as high as 661 Has had decreased motivation in the afternoon Libido may be slightly better  Because of significant rise in testosterone his clomiphene has been 2 times a week since 7/19 Testosterone level is still over 500  Lab Results  Component Value Date   TESTOSTERONE 516.00 07/21/2018   TESTOSTERONE 660.7 05/20/2018   TESTOSTERONE 267 03/30/2018   TESTOSTERONE 214 (L) 02/11/2018    Prolactin level: 5.8  Lab Results  Component Value Date   LH 20.04 (H) 05/20/2018       PROBLEM 2: DIABETES: None  Diagnosis date: ?  2016  Previous history:  Although he was diagnosed to have diabetes in 2016 he was not able to tolerate metformin that was previously prescribed Also was tried on Pease in 2017 and 2018 with some improved control He was tried on Bydureon in 06/2017 and continued until mil 3/19 d He was started on Ozempic and Tresiba insulin in 01/2018 by his PCP when his A1c was 7.8 and fasting glucose 165  Recent history:     Non-insulin hypoglycemic drugs:   Ozempic 0.5 mg weekly  His most recent A1c was 6% done in June  Current management,  blood sugar patterns and problems identified:   Insulin was stopped in 5/19 since his blood sugar was averaging only 92 on his freestyle sensor at home  He is monitoring blood sugar with the freestyle libre system  His blood sugars are fairly stable throughout the day with average blood sugar was 9 between 121 and 149 with highest readings late at night but he does not check enough readings after evening meal  His weight has leveled off or slightly higher  He is usually following a low-fat diet       Monitors blood glucose:   Uses Freestyle libre, this was started in 01/2018         Blood Glucose readings from freestyle libre reader download:   CGM use % of time  45  Average and SD  132+/-28  Time in range    93    %  % Time Above 180  6  % Time above 250   % Time Below target  1     FASTING blood sugar recent range 99-161  Side effects from medications:  Diarrhea from metformin   Hypoglycemia:  Occasionally mid to late afternoon with blood sugars in the 60s         Meals:  Usually low-fat        Physical activity: exercise: walks some  Dietician visit: Most recent:     Never Weight control:  Wt Readings from Last 3 Encounters:  07/24/18 (!) 393 lb (178.3 kg)  05/27/18 (!) 392 lb (177.8 kg)  05/20/18 (!) 387 lb (175.5 kg)   Lab Results  Component Value Date   HGBA1C 6.0 05/14/2018   HGBA1C 7.8 02/11/2018   HGBA1C 6.6 (H) 10/02/2017   Lab Results  Component Value Date   MICROALBUR 3.5 (H) 03/30/2018   LDLCALC 99 05/14/2018   CREATININE 0.86 05/14/2018    HYPOTHYROIDISM:  He was previously given levothyroxine for high TSH in 12/16 but later stopped because of improved TSH  Because of his fatigue he has been started on levothyroxine in 3/19 with only slight improvement in his fatigue He still complains of fatigue  He has been taking 75 mcg of levothyroxine, this was increased by Dr. Tamala Julian when his TSH was upper normal in July      Lab  Results  Component Value Date   TSH 4.24 05/20/2018   TSH 2.74 03/30/2018   TSH 6.01 (H) 02/11/2018   FREET4 0.82 07/21/2018   FREET4 0.92 03/30/2018   FREET4 0.97 02/08/2016        Allergies as of 07/24/2018      Reactions   Metformin And Related Diarrhea   Wellbutrin [bupropion] Other (See Comments)   tremors   Lisinopril Cough      Medication List        Accurate as of 07/24/18  8:58 AM. Always use your most recent med list.          ALPRAZolam 0.5 MG tablet Commonly known as:  XANAX Take 1 tablet (0.5 mg total) by mouth 3 (three) times daily as needed. for anxiety   cilostazol 50 MG tablet Commonly known as:  PLETAL Take 1 tablet (50 mg total) by mouth 2 (two) times daily.   clomiPHENE 50 MG tablet Commonly known as:  CLOMID Take 0.5 tablets (25 mg total) by mouth 2 (two) times a week.   FREESTYLE LIBRE READER Devi 1 Act by Does not apply route daily.   FREESTYLE LIBRE SENSOR SYSTEM Misc 1 Act by Does not apply route daily.   Insulin Pen Needle 31G X 6 MM Misc Use as needed for insulin injection.   levothyroxine 75 MCG tablet Commonly known as:  SYNTHROID, LEVOTHROID Take 1 tablet (75 mcg total) by mouth daily.   Melatonin 3-10 MG Tabs Take by mouth.   Semaglutide 0.25 or 0.5 MG/DOSE Sopn Inject 0.5 mg into the skin once a week.   triamterene-hydrochlorothiazide 37.5-25 MG tablet Commonly known as:  MAXZIDE-25 Take 1 tablet daily by mouth.   Vitamin D (Ergocalciferol) 50000 units Caps capsule Commonly known as:  DRISDOL TAKE 1 CAPSULE BY MOUTH ONCE A WEEK       Allergies:  Allergies  Allergen Reactions  . Metformin And Related Diarrhea  . Wellbutrin [Bupropion] Other (See Comments)    tremors  . Lisinopril Cough    Past Medical History:  Diagnosis Date  . Hypertension   . OSA on CPAP   . Type II diabetes mellitus with manifestations [facility])     Past Surgical History:  Procedure Laterality Date  . COLONOSCOPY WITH PROPOFOL N/A  11/12/2016   Procedure: COLONOSCOPY WITH PROPOFOL;  Surgeon: Mauri Pole, MD;  Location: [facility];  Service: Endoscopy;  Laterality: N/A;  . WISDOM TOOTH EXTRACTION      Family History  Problem Relation Age of Onset  . Colon  cancer Father   . Hypertension Father   . Thyroid cancer Mother   . Colon polyps Mother   . Thyroid disease Mother   . Thyroid disease Sister   . Thyroid disease Maternal Grandmother   . Alcohol abuse Neg Hx   . COPD Neg Hx   . Diabetes Neg Hx   . Early death Neg Hx   . Hearing loss Neg Hx   . Heart disease Neg Hx   . Hyperlipidemia Neg Hx   . Kidney disease Neg Hx   . Stroke Neg Hx     Social History:  reports that he has never smoked. His smokeless tobacco use includes chew. He reports that he does not drink alcohol or use drugs.  Review of Systems    LIPIDS: LDL below 100, not on medication  Lab Results  Component Value Date   CHOL 157 05/14/2018   HDL 41.40 05/14/2018   LDLCALC 99 05/14/2018   TRIG 79.0 05/14/2018   CHOLHDL 4 05/14/2018    General Examination:   BP 126/80 (BP Location: Left Arm, Patient Position: Sitting, Cuff Size: Large)   Pulse 94   Ht 6' (1.829 m)   Wt (!) 393 lb (178.3 kg)   SpO2 96%   BMI 53.30 kg/m     Assessment/ Plan:   HYPOGONADOTROPIC HYPOGONADISM with symptoms of fatigue and low free testosterone at baseline  He has been symptomatic with fatigue and decreased libido As discussed above even with increasing his testosterone levels to 500+ and using low-dose clomiphene he is still complaining of fatigue although less than before He agrees to a trial of testosterone gel instead of clomiphene and discussed in detail how this would be applied and how treatment would be monitored  FATIGUE: May be multifactorial and unlikely to be endocrine related   DIABETES with morbid obesity  See history of present illness for detailed discussion of current diabetes management, blood sugar patterns and  problems identified His baseline A1c was 7.8 and subsequently 6.0  His main difficulty is not being able to lose weight despite continuing Ozempic He agrees to a trial of 1 mg Ozempic instead of 0.5 and he can use his current supply His blood sugars are fairly good as noted on the freestyle libre although not checking enough readings after meals and average reading and anytime of the day 120 even fasting  When he finishes his 0.5 mg pens he will be switched to the 1 mg doses, 2 pens per month  HYPOTHYROIDISM: Not clear if he is symptomatic since even with increasing his levothyroxine in July his fatigue is not better and he is now taking 75 mcg Needs follow-up levels today  HYPERTENSION followed by PCP  Total visit time for evaluation and management of multiple problems and counseling =25 minutes  Flu vaccine given an information on current flu vaccine provided  Elayne Snare 07/24/2018, 8:58 AM     Note: This office note was prepared with Dragon voice recognition system technology. Any transcriptional errors that result from this process are unintentional.     Elayne Snare 07/24/2018, 8:58 AM

## 2018-07-24 NOTE — Patient Instructions (Signed)
1mg  Ozempic

## 2018-07-24 NOTE — Telephone Encounter (Signed)
His chart says left L4-L5 and left L5-S1. Just wanted to confirm that you wanted both.

## 2018-07-25 MED ORDER — TESTOSTERONE 20.25 MG/1.25GM (1.62%) TD GEL: TRANSDERMAL | 1 refills | Status: DC

## 2018-07-27 ENCOUNTER — Other Ambulatory Visit (INDEPENDENT_AMBULATORY_CARE_PROVIDER_SITE_OTHER): Payer: BLUE CROSS/BLUE SHIELD

## 2018-07-27 ENCOUNTER — Encounter: Payer: Self-pay | Admitting: Internal Medicine

## 2018-07-27 ENCOUNTER — Ambulatory Visit: Payer: BLUE CROSS/BLUE SHIELD | Admitting: Internal Medicine

## 2018-07-27 VITALS — BP 130/80 | HR 90 | Temp 97.7°F | Resp 16 | Ht 72.0 in | Wt 394.0 lb

## 2018-07-27 DIAGNOSIS — E559 Vitamin D deficiency, unspecified: Secondary | ICD-10-CM | POA: Diagnosis not present

## 2018-07-27 DIAGNOSIS — R5382 Chronic fatigue, unspecified: Secondary | ICD-10-CM | POA: Diagnosis not present

## 2018-07-27 DIAGNOSIS — I10 Essential (primary) hypertension: Secondary | ICD-10-CM

## 2018-07-27 DIAGNOSIS — E118 Type 2 diabetes mellitus with unspecified complications: Secondary | ICD-10-CM

## 2018-07-27 DIAGNOSIS — G250 Essential tremor: Secondary | ICD-10-CM

## 2018-07-27 DIAGNOSIS — E039 Hypothyroidism, unspecified: Secondary | ICD-10-CM | POA: Diagnosis not present

## 2018-07-27 LAB — VITAMIN D 25 HYDROXY (VIT D DEFICIENCY, FRACTURES): VITD: 62.98 ng/mL (ref 30.00–100.00)

## 2018-07-27 LAB — COMPREHENSIVE METABOLIC PANEL
ALT: 30 U/L (ref 0–53)
AST: 21 U/L (ref 0–37)
Albumin: 4.5 g/dL (ref 3.5–5.2)
Alkaline Phosphatase: 67 U/L (ref 39–117)
BUN: 14 mg/dL (ref 6–23)
CO2: 30 mEq/L (ref 19–32)
Calcium: 10 mg/dL (ref 8.4–10.5)
Chloride: 100 mEq/L (ref 96–112)
Creatinine, Ser: 0.9 mg/dL (ref 0.40–1.50)
GFR: 93.23 mL/min (ref >60.00)
Glucose, Bld: 125 mg/dL — ABNORMAL HIGH (ref 70–99)
Potassium: 3.5 mEq/L (ref 3.5–5.1)
Sodium: 138 mEq/L (ref 135–145)
Total Bilirubin: 1.2 mg/dL (ref 0.2–1.2)
Total Protein: 7.9 g/dL (ref 6.0–8.3)

## 2018-07-27 LAB — CBC WITH DIFFERENTIAL/PLATELET
Basophils Absolute: 0 10*3/uL (ref 0.0–0.1)
Basophils Relative: 0.5 % (ref 0.0–3.0)
Eosinophils Absolute: 0.2 10*3/uL (ref 0.0–0.7)
Eosinophils Relative: 3.6 % (ref 0.0–5.0)
HCT: 48.5 % (ref 39.0–52.0)
Hemoglobin: 16.6 g/dL (ref 13.0–17.0)
Lymphocytes Relative: 12.8 % (ref 12.0–46.0)
Lymphs Abs: 0.8 10*3/uL (ref 0.7–4.0)
MCHC: 34.2 g/dL (ref 30.0–36.0)
MCV: 85.9 fl (ref 78.0–100.0)
Monocytes Absolute: 0.7 10*3/uL (ref 0.1–1.0)
Monocytes Relative: 10.4 % (ref 3.0–12.0)
Neutro Abs: 4.8 10*3/uL (ref 1.4–7.7)
Neutrophils Relative %: 72.7 % (ref 43.0–77.0)
Platelets: 216 10*3/uL (ref 150.0–400.0)
RBC: 5.64 Mil/uL (ref 4.22–5.81)
RDW: 14 % (ref 11.5–15.5)
WBC: 6.6 10*3/uL (ref 4.0–10.5)

## 2018-07-27 LAB — HEMOGLOBIN A1C: Hgb A1c MFr Bld: 6.5 % (ref 4.6–6.5)

## 2018-07-27 LAB — TSH: TSH: 2.47 u[IU]/mL (ref 0.35–4.50)

## 2018-07-27 MED ORDER — SEMAGLUTIDE (1 MG/DOSE) 2 MG/1.5ML ~~LOC~~ SOPN: 1.0000 mg | PEN_INJECTOR | SUBCUTANEOUS | 1 refills | Status: DC

## 2018-07-27 NOTE — Patient Instructions (Signed)
Hypothyroidism Hypothyroidism is a disorder of the thyroid. The thyroid is a large gland that is located in the lower front of the neck. The thyroid releases hormones that control how the body works. With hypothyroidism, the thyroid does not make enough of these hormones. What are the causes? Causes of hypothyroidism may include:  Viral infections.  Pregnancy.  Your own defense system (immune system) attacking your thyroid.  Certain medicines.  Birth defects.  Past radiation treatments to your head or neck.  Past treatment with radioactive iodine.  Past surgical removal of part or all of your thyroid.  Problems with the gland that is located in the center of your brain (pituitary).  What are the signs or symptoms? Signs and symptoms of hypothyroidism may include:  Feeling as though you have no energy (lethargy).  Inability to tolerate cold.  Weight gain that is not explained by a change in diet or exercise habits.  Dry skin.  Coarse hair.  Menstrual irregularity.  Slowing of thought processes.  Constipation.  Sadness or depression.  How is this diagnosed? Your health care provider may diagnose hypothyroidism with blood tests and ultrasound tests. How is this treated? Hypothyroidism is treated with medicine that replaces the hormones that your body does not make. After you begin treatment, it may take several weeks for symptoms to go away. Follow these instructions at home:  Take medicines only as directed by your health care provider.  If you start taking any new medicines, tell your health care provider.  Keep all follow-up visits as directed by your health care provider. This is important. As your condition improves, your dosage needs may change. You will need to have blood tests regularly so that your health care provider can watch your condition. Contact a health care provider if:  Your symptoms do not get better with treatment.  You are taking thyroid  replacement medicine and: ? You sweat excessively. ? You have tremors. ? You feel anxious. ? You lose weight rapidly. ? You cannot tolerate heat. ? You have emotional swings. ? You have diarrhea. ? You feel weak. Get help right away if:  You develop chest pain.  You develop an irregular heartbeat.  You develop a rapid heartbeat. This information is not intended to replace advice given to you by your health care provider. Make sure you discuss any questions you have with your health care provider. Document Released: 11/04/2005 Document Revised: 04/11/2016 Document Reviewed: 03/22/2014 Elsevier Interactive Patient Education  2018 Elsevier Inc.  

## 2018-07-27 NOTE — Progress Notes (Signed)
Subjective:  Patient ID: Zachary Murray, male    DOB: 1966/07/31  Age: 52 y.o. MRN: [id number]  CC: Hypothyroidism; Hypertension; and Diabetes   HPI CAMEO SHEWELL presents for f/up - He complains of fatigue and tremors. He has had that tremors for several years but they have worsened over the last 2 years since he quit drinking. He has head tremors and hand tremors that interfere with his ability to write.  Outpatient Medications Prior to Visit  Medication Sig Dispense Refill  . ALPRAZolam (XANAX) 0.5 MG tablet Take 1 tablet (0.5 mg total) by mouth 3 (three) times daily as needed. for anxiety 270 tablet 1  . cilostazol (PLETAL) 50 MG tablet Take 1 tablet (50 mg total) by mouth 2 (two) times daily. 180 tablet 3  . Continuous Blood Gluc Receiver (FREESTYLE LIBRE READER) DEVI 1 Act by Does not apply route daily. 1 Device 11  . Continuous Blood Gluc Sensor (FREESTYLE LIBRE SENSOR SYSTEM) MISC 1 Act by Does not apply route daily. 3 each 11  . Insulin Pen Needle (RELION PEN NEEDLES) 31G X 6 MM MISC Use as needed for insulin injection. 100 each 3  . levothyroxine (SYNTHROID, LEVOTHROID) 75 MCG tablet Take 1 tablet (75 mcg total) by mouth daily. 90 tablet 3  . Melatonin 3-10 MG TABS Take by mouth.    . Testosterone 20.25 MG/1.25GM (1.62%) GEL Dispense 1 pump per each arm, rub in and allow to dry 75 g 1  . triamterene-hydrochlorothiazide (MAXZIDE-25) 37.5-25 MG tablet Take 1 tablet daily by mouth. 90 tablet 1  . Semaglutide (OZEMPIC) 0.25 or 0.5 MG/DOSE SOPN Inject 0.5 mg into the skin once a week. 9 mL 1  . Vitamin D, Ergocalciferol, (DRISDOL) 50000 units CAPS capsule TAKE 1 CAPSULE BY MOUTH ONCE A WEEK (Patient not taking: Reported on 07/27/2018) 12 capsule 0  . clomiPHENE (CLOMID) 50 MG tablet Take 0.5 tablets (25 mg total) by mouth 2 (two) times a week. 24 tablet 1   No facility-administered medications prior to visit.     ROS Review of Systems  Constitutional: Positive for fatigue. Negative for  appetite change, diaphoresis and unexpected weight change.  HENT: Negative.  Negative for trouble swallowing.   Eyes: Negative for visual disturbance.  Respiratory: Negative for cough, chest tightness, shortness of breath and wheezing.   Cardiovascular: Negative for chest pain and leg swelling.  Gastrointestinal: Negative for abdominal pain, constipation, diarrhea and vomiting.  Endocrine: Negative.  Negative for cold intolerance, heat intolerance, polydipsia, polyphagia and polyuria.  Genitourinary: Negative.  Negative for difficulty urinating.  Musculoskeletal: Negative.  Negative for arthralgias and myalgias.  Skin: Negative.  Negative for color change and rash.  Neurological: Positive for tremors. Negative for dizziness, weakness, light-headedness and headaches.  Hematological: Negative for adenopathy. Does not bruise/bleed easily.  Psychiatric/Behavioral: Negative.  Negative for dysphoric mood and sleep disturbance. The patient is not nervous/anxious.     Objective:  BP 130/80 (BP Location: Left Arm, Patient Position: Sitting, Cuff Size: Large)   Pulse 90   Temp 97.7 F (36.5 C) (Oral)   Resp 16   Ht 6' (1.829 m)   Wt (!) 394 lb (178.7 kg)   SpO2 98%   BMI 53.44 kg/m   BP Readings from Last 3 Encounters:  07/27/18 130/80  07/24/18 126/80  05/27/18 134/82    Wt Readings from Last 3 Encounters:  07/27/18 (!) 394 lb (178.7 kg)  07/24/18 (!) 393 lb (178.3 kg)  05/27/18 (!) 392 lb (177.8  kg)    Physical Exam  Constitutional: He is oriented to person, place, and time. No distress.  HENT:  Mouth/Throat: Oropharynx is clear and moist. No oropharyngeal exudate.  Eyes: Conjunctivae are normal. No scleral icterus.  Neck: Normal range of motion. Neck supple. No JVD present. No thyromegaly present.  Cardiovascular: Normal rate, regular rhythm, S1 normal, S2 normal and normal heart sounds. PMI is not displaced. Exam reveals no gallop.  No murmur heard. EKG ---  Sinus  Rhythm    WITHIN NORMAL LIMITS   Pulmonary/Chest: Effort normal and breath sounds normal. No respiratory distress. He has no wheezes. He has no rales.  Abdominal: Soft. Bowel sounds are normal. He exhibits no distension and no mass. There is no hepatosplenomegaly. There is no tenderness.  Musculoskeletal: Normal range of motion. He exhibits no edema or deformity.  Lymphadenopathy:    He has no cervical adenopathy.  Neurological: He is alert and oriented to person, place, and time. He displays tremor. He displays no atrophy. Coordination and gait normal.  Skin: Skin is warm and dry. No rash noted. He is not diaphoretic.  Psychiatric: He has a normal mood and affect. His behavior is normal. Judgment and thought content normal.  Vitals reviewed.   Lab Results  Component Value Date   WBC 6.6 07/27/2018   HGB 16.6 07/27/2018   HCT 48.5 07/27/2018   PLT 216.0 07/27/2018   GLUCOSE 125 (H) 07/27/2018   CHOL 157 05/14/2018   TRIG 79.0 05/14/2018   HDL 41.40 05/14/2018   LDLCALC 99 05/14/2018   ALT 30 07/27/2018   AST 21 07/27/2018   NA 138 07/27/2018   K 3.5 07/27/2018   CL 100 07/27/2018   CREATININE 0.90 07/27/2018   BUN 14 07/27/2018   CO2 30 07/27/2018   TSH 2.47 07/27/2018   PSA 0.47 03/26/2017   HGBA1C 6.5 07/27/2018   MICROALBUR 3.5 (H) 03/30/2018    Dg Facet Jt Inj L /s  2nd Level Left W/fl/ct  Result Date: 09/24/2017 CLINICAL DATA:  Facet medial lumbago. Excellent relief after the previous injection, greater than 80% for 6 months. EXAM: LEFT L4-5 AND LEFT L5-S1 FACET INJECTION UNDER FLUOROSCOPY TECHNIQUE: Informed written consent was obtained.  Time-out was performed. Overlying skin prepped with Betadine, draped in the usual sterile fashion, and infiltrated locally with 1% Lidocaine. Curved 22 gauge 5 inch spinal needle advanced to the posterior aspect of the left L4-5 facet. Injection of 1 ml Isovue-M 200 demonstrates intra-articular and periarticular spread. 60.0 mg Depo-Medrol and  2 ml Sensorcaine 0.25% was then administered. An identical procedure was performed on the LEFT at L5-S1, again administering 60.0 mg Depo-Medrol along with 2 mL 0.25% Sensorcaine. No immediate complication. Patient discharged home after adequate observation with a driver. FLUOROSCOPY TIME:  35 seconds corresponding to a Dose Area Product of 156.02 Gy*m2 IMPRESSION: Technically successful left L4-5 and LEFT L5-S1 facet injection under fluoroscopy. Electronically Signed   By: Staci Righter M.D.   On: 09/24/2017 08:12   Dg Facet Jt Inj L /s Single Level Left W/fl/ct  Result Date: 09/24/2017 CLINICAL DATA:  Facet medial lumbago. Excellent relief after the previous injection, greater than 80% for 6 months. EXAM: LEFT L4-5 AND LEFT L5-S1 FACET INJECTION UNDER FLUOROSCOPY TECHNIQUE: Informed written consent was obtained.  Time-out was performed. Overlying skin prepped with Betadine, draped in the usual sterile fashion, and infiltrated locally with 1% Lidocaine. Curved 22 gauge 5 inch spinal needle advanced to the posterior aspect of the left L4-5  facet. Injection of 1 ml Isovue-M 200 demonstrates intra-articular and periarticular spread. 60.0 mg Depo-Medrol and 2 ml Sensorcaine 0.25% was then administered. An identical procedure was performed on the LEFT at L5-S1, again administering 60.0 mg Depo-Medrol along with 2 mL 0.25% Sensorcaine. No immediate complication. Patient discharged home after adequate observation with a driver. FLUOROSCOPY TIME:  35 seconds corresponding to a Dose Area Product of 156.02 Gy*m2 IMPRESSION: Technically successful left L4-5 and LEFT L5-S1 facet injection under fluoroscopy. Electronically Signed   By: Staci Righter M.D.   On: 09/24/2017 08:12    Assessment & Plan:   Brand was seen today for hypothyroidism, hypertension and diabetes.  Diagnoses and all orders for this visit:  Essential hypertension, benign- His BP is well controlled -     Comprehensive metabolic panel;  Future  Acquired hypothyroidism- His TSH is in the normal range. Will remain on the current T4 dose. -     TSH; Future  Type 2 diabetes mellitus with complication, without long-term current use of insulin ([place])- his blood sugars are well controlled -     Semaglutide (OZEMPIC) 1 MG/DOSE SOPN; Inject 1 mg into the skin once a week. -     Comprehensive metabolic panel; Future -     Hemoglobin A1c; Future  Chronic fatigue- His EKG is neg for LVH or ischemia. Labs are neg for secondary causes. I think this is related to poor conditioning and obesity. He agrees to be more active. -     EKG 12-Lead -     CBC with Differential/Platelet; Future -     Comprehensive metabolic panel; Future  Tremor, essential -     Ambulatory referral to Neurology  Vitamin D deficiency- Improvement noted -     VITAMIN D 25 Hydroxy (Vit-D Deficiency, Fractures); Future   I have discontinued Erving Sassano. Mccombs "Ken"'s Semaglutide and clomiPHENE. I am also having him start on Semaglutide. Additionally, I am having him maintain his cilostazol, Melatonin, triamterene-hydrochlorothiazide, ALPRAZolam, FREESTYLE LIBRE READER, Insulin Pen Needle, Vitamin D (Ergocalciferol), FREESTYLE LIBRE SENSOR SYSTEM, levothyroxine, and Testosterone.  Meds ordered this encounter  Medications  . Semaglutide (OZEMPIC) 1 MG/DOSE SOPN    Sig: Inject 1 mg into the skin once a week.    Dispense:  12 pen    Refill:  1     Follow-up: Return in about 4 months (around 11/26/2018).  Scarlette Calico, MD

## 2018-07-31 ENCOUNTER — Ambulatory Visit: Payer: BLUE CROSS/BLUE SHIELD | Admitting: Gastroenterology

## 2018-07-31 ENCOUNTER — Encounter: Payer: Self-pay | Admitting: Gastroenterology

## 2018-07-31 ENCOUNTER — Encounter: Payer: Self-pay | Admitting: Neurology

## 2018-07-31 ENCOUNTER — Telehealth: Payer: Self-pay | Admitting: *Deleted

## 2018-07-31 VITALS — BP 120/78 | HR 90 | Ht 72.0 in | Wt 393.0 lb

## 2018-07-31 DIAGNOSIS — Z1211 Encounter for screening for malignant neoplasm of colon: Secondary | ICD-10-CM

## 2018-07-31 MED ORDER — NA SULFATE-K SULFATE-MG SULF 17.5-3.13-1.6 GM/177ML PO SOLN: ORAL | 0 refills | Status: DC

## 2018-07-31 NOTE — Patient Instructions (Signed)
We have scheduled you for a colonoscopy at [facility] endoscopy, separate instructions have been given    You will be contacted by our office prior to your procedure for directions on holding your Pletal.  If you do not hear from our office 1 week prior to your scheduled procedure, please call [phone] to discuss.    If you are age 54 or older, your body mass index should be between 23-30. Your Body mass index is 53.3 kg/m. If this is out of the aforementioned range listed, please consider follow up with your Primary Care Provider.  If you are age 74 or younger, your body mass index should be between 19-25. Your Body mass index is 53.3 kg/m. If this is out of the aformentioned range listed, please consider follow up with your Primary Care Provider.    Thank you for choosing [facility]  Karleen Hampshire Nandigam,MD

## 2018-07-31 NOTE — Telephone Encounter (Signed)
  07/31/2018   RE: Zachary Murray DOB: [birth year] MRN: [id number]   Dear Hulan Saas,    We have scheduled the above patient for an endoscopic procedure. Our records show that he is on anticoagulation therapy.   Please advise as to how long the patient may come off his therapy of Pletal prior to the procedure, which is scheduled for 08/31/2018.  Please fax back/ or route the completed form to [facility] at [phone].   Sincerely,    Tonita Phoenix

## 2018-07-31 NOTE — Progress Notes (Signed)
Zachary Murray    [id number]    07-27-1966  Primary Care Physician:Jones, Arvid Right, MD  Referring Physician: [name], MD [address],  [id number]  Chief complaint: Colorectal cancer screening  HPI:  52 year old male with family history of colon cancer here to discuss colorectal cancer screening.  Colonoscopy December 2017 was suboptimal due to inadequate prep.  Patient has no specific GI complaints.  He was diagnosed with flu last week and is recovering from it. Denies any nausea, vomiting, abdominal pain, melena or bright red blood per rectum He is on cilostazol for lower extremity pain thought secondary to claudication   Outpatient Encounter Medications as of 07/31/2018  Medication Sig  . ALPRAZolam (XANAX) 0.5 MG tablet Take 1 tablet (0.5 mg total) by mouth 3 (three) times daily as needed. for anxiety  . cilostazol (PLETAL) 50 MG tablet Take 1 tablet (50 mg total) by mouth 2 (two) times daily.  . Continuous Blood Gluc Receiver (FREESTYLE LIBRE READER) DEVI 1 Act by Does not apply route daily.  . Continuous Blood Gluc Sensor (FREESTYLE LIBRE SENSOR SYSTEM) MISC 1 Act by Does not apply route daily.  . Insulin Pen Needle (RELION PEN NEEDLES) 31G X 6 MM MISC Use as needed for insulin injection.  Marland Kitchen levothyroxine (SYNTHROID, LEVOTHROID) 75 MCG tablet Take 1 tablet (75 mcg total) by mouth daily.  . Melatonin 3-10 MG TABS Take by mouth.  . Semaglutide (OZEMPIC) 1 MG/DOSE SOPN Inject 1 mg into the skin once a week.  . Testosterone 20.25 MG/1.25GM (1.62%) GEL Dispense 1 pump per each arm, rub in and allow to dry  . triamterene-hydrochlorothiazide (MAXZIDE-25) 37.5-25 MG tablet Take 1 tablet daily by mouth.  . Vitamin D, Ergocalciferol, (DRISDOL) 50000 units CAPS capsule TAKE 1 CAPSULE BY MOUTH ONCE A WEEK   No facility-administered encounter medications on file as of 07/31/2018.     Allergies as of 07/31/2018 - Review Complete 07/31/2018    Allergen Reaction Noted  . Metformin and related Diarrhea 06/26/2017  . Wellbutrin [bupropion] Other (See Comments) 06/26/2017  . Lisinopril Cough 12/08/2014    Past Medical History:  Diagnosis Date  . Hypertension   . Obesity   . OSA on CPAP   . Type II diabetes mellitus with manifestations [facility])     Past Surgical History:  Procedure Laterality Date  . COLONOSCOPY WITH PROPOFOL N/A 11/12/2016   Procedure: COLONOSCOPY WITH PROPOFOL;  Surgeon: Zachary Pole, MD;  Location: [facility] ENDOSCOPY;  Service: Endoscopy;  Laterality: N/A;  . WISDOM TOOTH EXTRACTION      Family History  Problem Relation Age of Onset  . Colon cancer Father   . Hypertension Father   . Thyroid cancer Mother   . Colon polyps Mother   . Thyroid disease Mother   . Thyroid disease Sister   . Thyroid disease Maternal Grandmother   . Alcohol abuse Neg Hx   . COPD Neg Hx   . Diabetes Neg Hx   . Early death Neg Hx   . Hearing loss Neg Hx   . Heart disease Neg Hx   . Hyperlipidemia Neg Hx   . Kidney disease Neg Hx   . Stroke Neg Hx     Social History   Socioeconomic History  . Marital status: Single    Spouse name: Not on file  . Number of children: Not on file  . Years of education: Not on file  .  Highest education level: Not on file  Occupational History  . Occupation: self employeed--- Glass blower/designer  Social Needs  . Financial resource strain: Not on file  . Food insecurity:    Worry: Not on file    Inability: Not on file  . Transportation needs:    Medical: Not on file    Non-medical: Not on file  Tobacco Use  . Smoking status: Never Smoker  . Smokeless tobacco: Current User    Types: Chew  Substance and Sexual Activity  . Alcohol use: No    Alcohol/week: 0.0 standard drinks  . Drug use: No  . Sexual activity: Not Currently  Lifestyle  . Physical activity:    Days per week: Not on file    Minutes per session: Not on file  . Stress: Not on file  Relationships  . Social  connections:    Talks on phone: Not on file    Gets together: Not on file    Attends religious service: Not on file    Active member of club or organization: Not on file    Attends meetings of clubs or organizations: Not on file    Relationship status: Not on file  . Intimate partner violence:    Fear of current or ex partner: Not on file    Emotionally abused: Not on file    Physically abused: Not on file    Forced sexual activity: Not on file  Other Topics Concern  . Not on file  Social History Narrative  . Not on file      Review of systems: Review of Systems  Constitutional: Negative for fever and chills.  HENT: Negative.   Eyes: Negative for blurred vision.  Respiratory: Negative for cough, shortness of breath and wheezing.   Cardiovascular: Negative for chest pain and palpitations.  Gastrointestinal: as per HPI Genitourinary: Negative for dysuria, urgency, frequency and hematuria.  Musculoskeletal: Negative for myalgias, back pain and joint pain.  Skin: Negative for itching and rash.  Neurological: Negative for dizziness, tremors, focal weakness, seizures and loss of consciousness.  Endo/Heme/Allergies: Positive for seasonal allergies.  Psychiatric/Behavioral: Negative for depression, suicidal ideas and hallucinations.  All other systems reviewed and are negative.   Physical Exam: Vitals:   07/31/18 1349  BP: 120/78  Pulse: 90   Body mass index is 53.3 kg/m. Gen:      No acute distress HEENT:  EOMI, sclera anicteric Neck:     No masses; no thyromegaly Lungs:    Clear to auscultation bilaterally; normal respiratory effort CV:         Regular rate and rhythm; no murmurs Abd:      + bowel sounds; soft, non-tender; no palpable masses, no distension Ext:    No edema; adequate peripheral perfusion Skin:      Warm and dry; no rash Neuro: alert and oriented x 3 Psych: normal mood and affect  Data Reviewed:  Reviewed labs, radiology imaging, old records and  pertinent past GI work up   Assessment and Plan/Recommendations: 52 year old male with morbid obesity on Pletal for lower extremity pain thought to be secondary to vascular compromise, family history of colon cancer in father here to discuss colorectal cancer screening Colonoscopy December 2017 was suboptimal due to inadequate prep We will repeat colonoscopy with 2-day bowel prep [facility] be [facility] due to high risk with BMI greater than 50 The risks and benefits as well as alternatives of endoscopic procedure(s) have been discussed and  reviewed. All questions answered. The patient agrees to proceed.   Damaris Hippo , MD [phone]    CC: [name], MD

## 2018-08-03 ENCOUNTER — Encounter: Payer: Self-pay | Admitting: Gastroenterology

## 2018-08-03 ENCOUNTER — Ambulatory Visit: Payer: BLUE CROSS/BLUE SHIELD | Admitting: Neurology

## 2018-08-03 ENCOUNTER — Encounter: Payer: Self-pay | Admitting: Neurology

## 2018-08-03 VITALS — BP 124/84 | HR 102 | Ht 72.0 in | Wt 390.0 lb

## 2018-08-03 DIAGNOSIS — G250 Essential tremor: Secondary | ICD-10-CM

## 2018-08-03 MED ORDER — PRIMIDONE 50 MG PO TABS: 50.0000 mg | ORAL_TABLET | Freq: Every day | ORAL | 1 refills | Status: DC

## 2018-08-03 NOTE — Progress Notes (Signed)
Subjective:   Zachary Murray was seen in consultation in the movement disorder clinic at the request of [name], MD.  The evaluation is for tremor.  Tremor started several years ago.  States that he was a heavy drinker and thinks that it was masking tremor.  He stopped 2 years ago and tremor is worse since then.  He notes that tremor involves the hands and head.  Head shakes in the "yes" direction.  Tremor is most noticeable when using the hands and it is "a lot better at rest."   There is a family hx of tremor in his mother (head).    Affected by caffeine:  Doesn't drink a lot but "I think that it may help it" Affected by alcohol:  Doesn't drink at all now Affected by stress:  Yes.   Affected by fatigue:  Yes.   Spills soup if on spoon:  May or may not Spills glass of liquid if full:  May or may not Affects ADL's (tying shoes, brushing teeth, etc):  Affects ability to shave (shaves with a blade)  Current/Previously tried tremor medications: n/a (was on gabapentin and "it makes me angry")  Current medications that may exacerbate tremor:  n/a  Outside reports reviewed: historical medical records, lab reports, office notes and referral letter/letters.  Allergies  Allergen Reactions  . Metformin And Related Diarrhea  . Wellbutrin [Bupropion] Other (See Comments)    tremors  . Lisinopril Cough    Outpatient Encounter Medications as of 08/03/2018  Medication Sig  . ALPRAZolam (XANAX) 0.5 MG tablet Take 1 tablet (0.5 mg total) by mouth 3 (three) times daily as needed. for anxiety  . cilostazol (PLETAL) 50 MG tablet Take 1 tablet (50 mg total) by mouth 2 (two) times daily.  . Continuous Blood Gluc Receiver (FREESTYLE LIBRE READER) DEVI 1 Act by Does not apply route daily.  . Continuous Blood Gluc Sensor (FREESTYLE LIBRE SENSOR SYSTEM) MISC 1 Act by Does not apply route daily.  . Insulin Pen Needle (RELION PEN NEEDLES) 31G X 6 MM MISC Use as needed for insulin injection.  Marland Kitchen  levothyroxine (SYNTHROID, LEVOTHROID) 75 MCG tablet Take 1 tablet (75 mcg total) by mouth daily.  . Melatonin 3-10 MG TABS Take by mouth.  . Na Sulfate-K Sulfate-Mg Sulf 17.5-3.13-1.6 GM/177ML SOLN 1 kit  . Semaglutide (OZEMPIC) 1 MG/DOSE SOPN Inject 1 mg into the skin once a week.  Francella Solian Johns Wort 1000 MG CAPS Take by mouth.  . Testosterone 20.25 MG/1.25GM (1.62%) GEL Dispense 1 pump per each arm, rub in and allow to dry  . triamterene-hydrochlorothiazide (MAXZIDE-25) 37.5-25 MG tablet Take 1 tablet daily by mouth.  . Vitamin D, Ergocalciferol, (DRISDOL) 50000 units CAPS capsule TAKE 1 CAPSULE BY MOUTH ONCE A WEEK   No facility-administered encounter medications on file as of 08/03/2018.     Past Medical History:  Diagnosis Date  . Hypertension   . Obesity   . OSA on CPAP   . Type II diabetes mellitus with manifestations [facility])     Past Surgical History:  Procedure Laterality Date  . COLONOSCOPY WITH PROPOFOL N/A 11/12/2016   Procedure: COLONOSCOPY WITH PROPOFOL;  Surgeon: Mauri Pole, MD;  Location: [facility];  Service: Endoscopy;  Laterality: N/A;  . WISDOM TOOTH EXTRACTION      Social History   Socioeconomic History  . Marital status: Single    Spouse name: Not on file  . Number of children: Not on file  .  Years of education: Not on file  . Highest education level: Not on file  Occupational History  . Occupation: self employeed--- Glass blower/designer  Social Needs  . Financial resource strain: Not on file  . Food insecurity:    Worry: Not on file    Inability: Not on file  . Transportation needs:    Medical: Not on file    Non-medical: Not on file  Tobacco Use  . Smoking status: Never Smoker  . Smokeless tobacco: Current User    Types: Chew  Substance and Sexual Activity  . Alcohol use: No    Alcohol/week: 0.0 standard drinks  . Drug use: No  . Sexual activity: Not Currently  Lifestyle  . Physical activity:    Days per week: Not on file    Minutes per  session: Not on file  . Stress: Not on file  Relationships  . Social connections:    Talks on phone: Not on file    Gets together: Not on file    Attends religious service: Not on file    Active member of club or organization: Not on file    Attends meetings of clubs or organizations: Not on file    Relationship status: Not on file  . Intimate partner violence:    Fear of current or ex partner: Not on file    Emotionally abused: Not on file    Physically abused: Not on file    Forced sexual activity: Not on file  Other Topics Concern  . Not on file  Social History Narrative  . Not on file    Family Status  Relation Name Status  . Father  Alive  . Mother  Alive  . Sister  Alive  . MGM  (Not Specified)  . Neg Hx  (Not Specified)    Review of Systems Review of Systems  Constitutional: Positive for malaise/fatigue (chronic).  HENT: Negative.   Eyes: Negative.   Respiratory: Negative.   Cardiovascular: Negative.   Gastrointestinal: Negative.  Negative for blood in stool.  Genitourinary: Negative.  Negative for hematuria.  Musculoskeletal: Positive for back pain.  Skin: Negative.   Neurological: Positive for tremors.  Endo/Heme/Allergies: Negative.      Objective:   VITALS:   Vitals:   08/03/18 0948  BP: 124/84  Pulse: (!) 102  SpO2: 94%  Weight: (!) 390 lb (176.9 kg)  Height: 6' (1.829 m)   Gen:  Appears stated age and in NAD. HEENT:  Normocephalic, atraumatic. The mucous membranes are moist. The superficial temporal arteries are without ropiness or tenderness. Cardiovascular: tachy.  regular Lungs: Clear to auscultation bilaterally. Neck: There are no carotid bruits noted bilaterally.  NEUROLOGICAL:  Orientation:  The patient is alert and oriented x 3.  Recent and remote memory are intact.  Attention span and concentration are normal.  Able to name objects and repeat without trouble.  Fund of knowledge is appropriate Cranial nerves: There is good facial  symmetry. The pupils are equal round and reactive to light bilaterally. Fundoscopic exam reveals clear disc margins bilaterally. Extraocular muscles are intact and visual fields are full to confrontational testing. Speech is fluent and clear. Soft palate rises symmetrically and there is no tongue deviation. Hearing is intact to conversational tone. Tone: Tone is good throughout. Sensation: Sensation is intact to light touch and pinprick throughout (facial, trunk, extremities). Vibration is intact at the bilateral big toe. There is no extinction with double simultaneous stimulation. There is no sensory dermatomal level identified.  Coordination:  The patient has no dysdiadichokinesia or dysmetria. Motor: Strength is 5/5 in the bilateral upper and lower extremities.  Shoulder shrug is equal bilaterally.  There is no pronator drift.  There are no fasciculations noted. DTR's: Deep tendon reflexes are 2-/4 at the bilateral biceps, triceps, brachioradialis, patella and achilles.  Plantar responses are downgoing bilaterally. Gait and Station: The patient initially ambulates with an antalgic gait but better the longer he walks  MOVEMENT EXAM: Tremor:  There is postural and intention tremor.   There is head tremor in the "yes" direction (has head turned to the left but no null point).  The patient is not able to draw Archimedes spirals without significant difficulty.  There is mild intermittent tremor at rest.  The patient is not able to pour water from one glass to another without spilling it.  Labs:   Chemistry      Component Value Date/Time   NA 138 07/27/2018 1018   K 3.5 07/27/2018 1018   CL 100 07/27/2018 1018   CO2 30 07/27/2018 1018   BUN 14 07/27/2018 1018   CREATININE 0.90 07/27/2018 1018      Component Value Date/Time   CALCIUM 10.0 07/27/2018 1018   ALKPHOS 67 07/27/2018 1018   AST 21 07/27/2018 1018   ALT 30 07/27/2018 1018   BILITOT 1.2 07/27/2018 1018     Lab Results  Component  Value Date   TSH 2.47 07/27/2018        Assessment/Plan:   1.  Essential Tremor.  -This is evidenced by the symmetrical nature and longstanding hx of gradually getting worse.  We discussed nature and pathophysiology.  We discussed that this can continue to gradually get worse with time.  We discussed that some medications can worsen this, as can caffeine use.  We discussed medication therapy as well as surgical therapy.  Ultimately, the patient decided to try low dose primidone.  Told pt that we will call him in a month to see how doing and likely increase his medication at that time.  He is hesitant about trying beta-blockers because he read about side effects, although it could potentially help the tachycardia he had in the office as well.  I did tell him that medications likely will not help head tremor.  -Patient was shown HIPAA compliant videos of patients who have had DBS surgery for essential tremor.  He is interested in therapy.  We will continue to leave that as an option, but he really needs to try medication therapy first.  2.  Hx alcoholism  -sober x 2 years  3.  Follow up is anticipated in the next few months, sooner should new neurologic issues arise.  CC:  [name], MD

## 2018-08-03 NOTE — Patient Instructions (Signed)
1. Start Primidone 50 mg tablets. Take 1/2 tablet for 4 nights, then increase to 1 tablet. Prescription has been sent to your pharmacy. The first dose of medication can cause some dizziness/nausea that should go away after the first dose.   

## 2018-08-03 NOTE — Telephone Encounter (Signed)
I would stop 5 days before procedure and may restart the day after procedure. Thank you

## 2018-08-04 ENCOUNTER — Telehealth: Payer: Self-pay

## 2018-08-04 NOTE — Telephone Encounter (Signed)
Called and spoke with [facility], [phone], and they stated I would have to complete a form. Form is being faxed at attention to me. This form will need to be completed in order to process the prior authorization for the patients testosterone 1.62%

## 2018-08-05 NOTE — Telephone Encounter (Signed)
I filled out and gave to TG to review/thx dmf

## 2018-08-13 NOTE — Telephone Encounter (Signed)
Patient returned my call about holding his blood thinner and informed him on holding his insulin

## 2018-08-14 ENCOUNTER — Telehealth: Payer: Self-pay

## 2018-08-14 NOTE — Telephone Encounter (Signed)
PA for Testosterone 1.62% TD Gel approved from [id number]/Ref# [id number]/thx dmf

## 2018-08-17 ENCOUNTER — Other Ambulatory Visit: Payer: Self-pay | Admitting: Endocrinology

## 2018-08-17 MED ORDER — TESTOSTERONE 20.25 MG/1.25GM (1.62%) TD GEL: TRANSDERMAL | 1 refills | Status: DC

## 2018-08-19 ENCOUNTER — Other Ambulatory Visit: Payer: Self-pay

## 2018-08-19 DIAGNOSIS — E118 Type 2 diabetes mellitus with unspecified complications: Secondary | ICD-10-CM

## 2018-08-19 MED ORDER — SEMAGLUTIDE (1 MG/DOSE) 2 MG/1.5ML ~~LOC~~ SOPN: 1.0000 mg | PEN_INJECTOR | SUBCUTANEOUS | 1 refills | Status: DC

## 2018-08-27 ENCOUNTER — Encounter (HOSPITAL_COMMUNITY): Payer: Self-pay | Admitting: *Deleted

## 2018-08-27 ENCOUNTER — Other Ambulatory Visit: Payer: Self-pay

## 2018-08-28 ENCOUNTER — Ambulatory Visit: Payer: BLUE CROSS/BLUE SHIELD | Admitting: Neurology

## 2018-08-31 ENCOUNTER — Encounter: Payer: Self-pay | Admitting: Internal Medicine

## 2018-08-31 ENCOUNTER — Encounter (HOSPITAL_COMMUNITY): Payer: Self-pay | Admitting: Emergency Medicine

## 2018-08-31 ENCOUNTER — Ambulatory Visit (HOSPITAL_COMMUNITY)
Admission: RE | Admit: 2018-08-31 | Discharge: 2018-08-31 | Disposition: A | Discharge status: Home / Self Care | Payer: BLUE CROSS/BLUE SHIELD | Source: Ambulatory Visit | Attending: Gastroenterology | Admitting: Gastroenterology

## 2018-08-31 ENCOUNTER — Ambulatory Visit (HOSPITAL_COMMUNITY): Payer: BLUE CROSS/BLUE SHIELD | Admitting: Anesthesiology

## 2018-08-31 ENCOUNTER — Encounter (HOSPITAL_COMMUNITY)
Admission: RE | Disposition: A | Discharge status: Home / Self Care | Payer: Self-pay | Source: Ambulatory Visit | Attending: Gastroenterology

## 2018-08-31 ENCOUNTER — Other Ambulatory Visit: Payer: Self-pay

## 2018-08-31 ENCOUNTER — Other Ambulatory Visit: Payer: Self-pay | Admitting: Family Medicine

## 2018-08-31 DIAGNOSIS — K5730 Diverticulosis of large intestine without perforation or abscess without bleeding: Secondary | ICD-10-CM | POA: Insufficient documentation

## 2018-08-31 DIAGNOSIS — D124 Benign neoplasm of descending colon: Secondary | ICD-10-CM | POA: Diagnosis not present

## 2018-08-31 DIAGNOSIS — Z1211 Encounter for screening for malignant neoplasm of colon: Secondary | ICD-10-CM

## 2018-08-31 DIAGNOSIS — D123 Benign neoplasm of transverse colon: Secondary | ICD-10-CM | POA: Diagnosis not present

## 2018-08-31 DIAGNOSIS — Z794 Long term (current) use of insulin: Secondary | ICD-10-CM | POA: Insufficient documentation

## 2018-08-31 DIAGNOSIS — Z6841 Body Mass Index (BMI) 40.0 and over, adult: Secondary | ICD-10-CM | POA: Insufficient documentation

## 2018-08-31 DIAGNOSIS — G4733 Obstructive sleep apnea (adult) (pediatric): Secondary | ICD-10-CM | POA: Insufficient documentation

## 2018-08-31 DIAGNOSIS — Z800 Family history of malignant neoplasm of digestive organs: Secondary | ICD-10-CM | POA: Diagnosis not present

## 2018-08-31 DIAGNOSIS — E119 Type 2 diabetes mellitus without complications: Secondary | ICD-10-CM | POA: Diagnosis not present

## 2018-08-31 DIAGNOSIS — K635 Polyp of colon: Secondary | ICD-10-CM | POA: Diagnosis not present

## 2018-08-31 DIAGNOSIS — I10 Essential (primary) hypertension: Secondary | ICD-10-CM | POA: Diagnosis not present

## 2018-08-31 DIAGNOSIS — K648 Other hemorrhoids: Secondary | ICD-10-CM | POA: Insufficient documentation

## 2018-08-31 HISTORY — PX: COLONOSCOPY WITH PROPOFOL: SHX5780

## 2018-08-31 HISTORY — PX: POLYPECTOMY: SHX5525

## 2018-08-31 HISTORY — PX: BIOPSY: SHX5522

## 2018-08-31 LAB — GLUCOSE, CAPILLARY: Glucose-Capillary: 92 mg/dL (ref 70–99)

## 2018-08-31 SURGERY — COLONOSCOPY WITH PROPOFOL
Anesthesia: Monitor Anesthesia Care

## 2018-08-31 MED ORDER — PROPOFOL 10 MG/ML IV BOLUS
INTRAVENOUS | Status: AC
  Filled 2018-08-31: qty 60

## 2018-08-31 MED ORDER — PROPOFOL 500 MG/50ML IV EMUL
INTRAVENOUS | Status: DC | PRN
  Administered 2018-08-31: 100 ug/kg/min via INTRAVENOUS

## 2018-08-31 MED ORDER — LACTATED RINGERS IV SOLN
INTRAVENOUS | Status: DC
  Administered 2018-08-31: 11:00:00 via INTRAVENOUS

## 2018-08-31 MED ORDER — SODIUM CHLORIDE 0.9 % IV SOLN: INTRAVENOUS | Status: DC

## 2018-08-31 MED ORDER — ONDANSETRON HCL 4 MG/2ML IJ SOLN
INTRAMUSCULAR | Status: DC | PRN
  Administered 2018-08-31: 4 mg via INTRAVENOUS

## 2018-08-31 MED ORDER — PROPOFOL 10 MG/ML IV BOLUS
INTRAVENOUS | Status: DC | PRN
  Administered 2018-08-31: 20 mg via INTRAVENOUS
  Administered 2018-08-31: 30 mg via INTRAVENOUS
  Administered 2018-08-31: 20 mg via INTRAVENOUS

## 2018-08-31 SURGICAL SUPPLY — 21 items

## 2018-08-31 NOTE — Anesthesia Postprocedure Evaluation (Signed)
Anesthesia Post Note  Patient: Zachary Murray  Procedure(s) Performed: COLONOSCOPY WITH PROPOFOL (N/A ) POLYPECTOMY     Patient location during evaluation: PACU Anesthesia Type: MAC Level of consciousness: awake and alert Pain management: pain level controlled Vital Signs Assessment: post-procedure vital signs reviewed and stable Respiratory status: spontaneous breathing, nonlabored ventilation and respiratory function stable Cardiovascular status: stable and blood pressure returned to baseline Postop Assessment: no apparent nausea or vomiting Anesthetic complications: no    Last Vitals:  Vitals:   08/31/18 1215 08/31/18 1220  BP:  138/81  Pulse: 87 84  Resp: (!) 22 (!) 21  Temp:    SpO2: 97% 97%    Last Pain:  Vitals:   08/31/18 1220  TempSrc:   PainSc: 0-No pain                 Lynda Rainwater

## 2018-08-31 NOTE — Discharge Instructions (Signed)
YOU HAD AN ENDOSCOPIC PROCEDURE TODAY: Refer to the procedure report and other information in the discharge instructions given to you for any specific questions about what was found during the examination. If this information does not answer your questions, please call [place] office at [phone] to clarify.  ° °YOU SHOULD EXPECT: Some feelings of bloating in the abdomen. Passage of more gas than usual. Walking can help get rid of the air that was put into your GI tract during the procedure and reduce the bloating. If you had a lower endoscopy (such as a colonoscopy or flexible sigmoidoscopy) you may notice spotting of blood in your stool or on the toilet paper. Some abdominal soreness may be present for a day or two, also. ° °DIET: Your first meal following the procedure should be a light meal and then it is ok to progress to your normal diet. A half-sandwich or bowl of soup is an example of a good first meal. Heavy or fried foods are harder to digest and may make you feel nauseous or bloated. Drink plenty of fluids but you should avoid alcoholic beverages for 24 hours. If you had a esophageal dilation, please see attached instructions for diet.   ° °ACTIVITY: Your care partner should take you home directly after the procedure. You should plan to take it easy, moving slowly for the rest of the day. You can resume normal activity the day after the procedure however YOU SHOULD NOT DRIVE, use power tools, machinery or perform tasks that involve climbing or major physical exertion for 24 hours (because of the sedation medicines used during the test).  ° °SYMPTOMS TO REPORT IMMEDIATELY: °A gastroenterologist can be reached at any hour. Please call [phone]  for any of the following symptoms:  °Following lower endoscopy (colonoscopy, flexible sigmoidoscopy) °Excessive amounts of blood in the stool  °Significant tenderness, worsening of abdominal pains  °Swelling of the abdomen that is new, acute  °Fever of 100° or  higher  °Following upper endoscopy (EGD, EUS, ERCP, esophageal dilation) °Vomiting of blood or coffee ground material  °New, significant abdominal pain  °New, significant chest pain or pain under the shoulder blades  °Painful or persistently difficult swallowing  °New shortness of breath  °Black, tarry-looking or red, bloody stools ° °FOLLOW UP:  °If any biopsies were taken you will be contacted by phone or by letter within the next 1-3 weeks. Call [phone]  if you have not heard about the biopsies in 3 weeks.  °Please also call with any specific questions about appointments or follow up tests. ° °

## 2018-08-31 NOTE — Anesthesia Procedure Notes (Signed)
Procedure Name: MAC Date/Time: 08/31/2018 11:33 AM Performed by: Lollie Sails, CRNA Pre-anesthesia Checklist: Patient identified, Emergency Drugs available, Suction available, Patient being monitored and Timeout performed Oxygen Delivery Method: Simple face mask

## 2018-08-31 NOTE — Transfer of Care (Signed)
Immediate Anesthesia Transfer of Care Note  Patient: Zachary Murray  Procedure(s) Performed: COLONOSCOPY WITH PROPOFOL (N/A ) POLYPECTOMY  Patient Location: PACU and Endoscopy Unit  Anesthesia Type:MAC  Level of Consciousness: awake, oriented, drowsy and patient cooperative  Airway & Oxygen Therapy: Patient Spontanous Breathing and Patient connected to face mask oxygen  Post-op Assessment: Report given to RN, Post -op Vital signs reviewed and stable and Patient moving all extremities  Post vital signs: Reviewed and stable  Last Vitals:  Vitals Value Taken Time  BP 141/88 08/31/2018 12:07 PM  Temp 36.6 C 08/31/2018 12:07 PM  Pulse 88 08/31/2018 12:09 PM  Resp 21 08/31/2018 12:09 PM  SpO2 100 % 08/31/2018 12:09 PM  Vitals shown include unvalidated device data.  Last Pain:  Vitals:   08/31/18 1207  TempSrc: Oral  PainSc: 0-No pain         Complications: No apparent anesthesia complications

## 2018-08-31 NOTE — Anesthesia Postprocedure Evaluation (Signed)
Anesthesia Post Note  Patient: GARION WEMPE  Procedure(s) Performed: COLONOSCOPY WITH PROPOFOL (N/A ) POLYPECTOMY     Patient location during evaluation: Endoscopy Anesthesia Type: MAC Level of consciousness: awake and alert Pain management: pain level controlled Vital Signs Assessment: post-procedure vital signs reviewed and stable Respiratory status: spontaneous breathing, nonlabored ventilation and respiratory function stable Cardiovascular status: stable and blood pressure returned to baseline Postop Assessment: no apparent nausea or vomiting Anesthetic complications: no    Last Vitals:  Vitals:   08/31/18 1215 08/31/18 1220  BP:  138/81  Pulse: 87 84  Resp: (!) 22 (!) 21  Temp:    SpO2: 97% 97%    Last Pain:  Vitals:   08/31/18 1220  TempSrc:   PainSc: 0-No pain                 Lynda Rainwater

## 2018-08-31 NOTE — H&P (Signed)
[facility] History and Physical   Primary Care Physician:  [name], MD   Reason for Procedure:  Colon cancer screening. Family history of colon cancer Plan:    Colonoscopy with possible interventions    HPI: Zachary Murray is a 52 y.o. male with family history of colon cancer is here for colonoscopy.  Last Colonoscopy in December 2017 was inadequate prep. The risks and benefits as well as alternatives of endoscopic procedure(s) have been discussed and reviewed. All questions answered. The patient agrees to proceed.   Past Medical History:  Diagnosis Date  . Hypertension   . Obesity   . OSA on CPAP   . Type II diabetes mellitus with manifestations [facility])     Past Surgical History:  Procedure Laterality Date  . COLONOSCOPY WITH PROPOFOL N/A 11/12/2016   Procedure: COLONOSCOPY WITH PROPOFOL;  Surgeon: Mauri Pole, MD;  Location: [facility];  Service: Endoscopy;  Laterality: N/A;  . WISDOM TOOTH EXTRACTION      Prior to Admission medications   Medication Sig Start Date End Date Taking? Authorizing Provider  ALPRAZolam Duanne Moron) 0.5 MG tablet Take 1 tablet (0.5 mg total) by mouth 3 (three) times daily as needed. for anxiety 02/11/18  Yes [name], MD  cilostazol (PLETAL) 50 MG tablet Take 1 tablet (50 mg total) by mouth 2 (two) times daily. 06/26/17  Yes [name], MD  clomiPHENE (CLOMID) 50 MG tablet Take 25 mg by mouth 2 (two) times a week. Friday & Monday   Yes [provider]  Glucosamine-Chondroitin (COSAMIN DS PO) Take 1 tablet by mouth daily. 1500-1200   Yes [provider]  Insulin Pen Needle (RELION PEN NEEDLES) 31G X 6 MM MISC Use as needed for insulin injection. 02/18/18  Yes [name], MD  levothyroxine (SYNTHROID, LEVOTHROID) 75 MCG tablet Take 1 tablet (75 mcg total) by mouth daily. 05/27/18  Yes Hulan Saas M, DO  Na Sulfate-K Sulfate-Mg Sulf 17.5-3.13-1.6 GM/177ML SOLN 1 kit 07/31/18  Yes Nandigam, Venia Minks,  MD  Semaglutide, 1 MG/DOSE, (OZEMPIC, 1 MG/DOSE,) 2 MG/1.5ML SOPN Inject 1 mg into the skin once a week. Patient taking differently: Inject 1 mg into the skin every Tuesday.  08/19/18  Yes Elayne Snare, MD  triamterene-hydrochlorothiazide [facility]) 37.5-25 MG tablet Take 1 tablet daily by mouth. 10/02/17  Yes [name], MD  Vitamin D, Ergocalciferol, (DRISDOL) 50000 units CAPS capsule TAKE 1 CAPSULE BY MOUTH ONCE A WEEK Patient taking differently: Take 50,000 Units by mouth every Tuesday.  04/27/18  Yes Lyndal Pulley, DO  Continuous Blood Gluc Receiver (FREESTYLE LIBRE READER) DEVI 1 Act by Does not apply route daily. 02/11/18   [name], MD  Continuous Blood Gluc Sensor ([place]) MISC 1 Act by Does not apply route daily. 05/04/18   Elayne Snare, MD  Melatonin 3 MG TBDP Take 1.5 mg by mouth at bedtime as needed (sleep).    [provider]  primidone (MYSOLINE) 50 MG tablet Take 1 tablet (50 mg total) by mouth at bedtime. Patient not taking: Reported on 08/26/2018 08/03/18   Tat, Eustace Quail, DO  [place] JOHNS WORT PO Take 1,400 mg by mouth daily.    [provider]  Testosterone 20.25 MG/1.25GM (1.62%) GEL Dispense 1 pump per each arm, rub in and allow to dry Patient not taking: Reported on 08/26/2018 08/17/18   Elayne Snare, MD    Current Facility-Administered Medications  Medication Dose Route Frequency Provider Last  Rate Last Dose  . 0.9 %  sodium chloride infusion   Intravenous Continuous Nandigam, Kavitha V, MD      . lactated ringers infusion   Intravenous Continuous Mauri Pole, MD        Allergies as of 07/31/2018 - Review Complete 07/31/2018  Allergen Reaction Noted  . Metformin and related Diarrhea 06/26/2017  . Wellbutrin [bupropion] Other (See Comments) 06/26/2017  . Lisinopril Cough 12/08/2014    Family History  Problem Relation Age of Onset  . Colon cancer Father   . Hypertension Father   . Thyroid cancer Mother   .  Colon polyps Mother   . Thyroid disease Mother   . Thyroid disease Sister   . Thyroid disease Maternal Grandmother   . Alcohol abuse Neg Hx   . COPD Neg Hx   . Diabetes Neg Hx   . Early death Neg Hx   . Hearing loss Neg Hx   . Heart disease Neg Hx   . Hyperlipidemia Neg Hx   . Kidney disease Neg Hx   . Stroke Neg Hx     Social History   Socioeconomic History  . Marital status: Single    Spouse name: Not on file  . Number of children: Not on file  . Years of education: Not on file  . Highest education level: Not on file  Occupational History  . Occupation: unemployed  Social Needs  . Financial resource strain: Not on file  . Food insecurity:    Worry: Not on file    Inability: Not on file  . Transportation needs:    Medical: Not on file    Non-medical: Not on file  Tobacco Use  . Smoking status: Never Smoker  . Smokeless tobacco: Current User    Types: Chew  Substance and Sexual Activity  . Alcohol use: No    Alcohol/week: 0.0 standard drinks    Comment: hx of alcohol abuse - quit 2017  . Drug use: No  . Sexual activity: Not Currently  Lifestyle  . Physical activity:    Days per week: Not on file    Minutes per session: Not on file  . Stress: Not on file  Relationships  . Social connections:    Talks on phone: Not on file    Gets together: Not on file    Attends religious service: Not on file    Active member of club or organization: Not on file    Attends meetings of clubs or organizations: Not on file    Relationship status: Not on file  . Intimate partner violence:    Fear of current or ex partner: Not on file    Emotionally abused: Not on file    Physically abused: Not on file    Forced sexual activity: Not on file  Other Topics Concern  . Not on file  Social History Narrative  . Not on file    Review of Systems:  All other review of systems negative except as mentioned in the HPI.  Physical Exam: Vital signs in last 24 hours: Temp:  [98.1  F (36.7 C)] 98.1 F (36.7 C) (10/14 1021) Pulse Rate:  [96] 96 (10/14 1021) Resp:  [23] 23 (10/14 1021) BP: (150)/(77) 150/77 (10/14 1021) SpO2:  [95 %] 95 % (10/14 1021) Weight:  [176.9 kg] 176.9 kg (10/14 1021)   General:   Alert,  Well-developed, well-nourished, pleasant and cooperative in NAD Lungs:  Clear throughout to auscultation.   Heart:  Regular rate and rhythm; no murmurs, clicks, rubs,  or gallops. Abdomen:  Soft, nontender and nondistended. Normal bowel sounds.   Neuro/Psych:  Alert and cooperative. Normal mood and affect. A and O x 3   K. Denzil Magnuson , MD [phone]

## 2018-08-31 NOTE — Op Note (Signed)
[facility] Patient Name: Zachary Murray Procedure Date: 08/31/2018 MRN: [id number] Attending MD: Mauri Pole , MD Date of Birth: 03-Jul-1966 CSN: [id number] Age: 52 Admit Type: Inpatient Procedure:                Colonoscopy Indications:              Screening in patient at increased risk: Family                            history of 1st-degree relative with colorectal                            cancer Providers:                Mauri Pole, MD, Cleda Daub, RN, Cherylynn Ridges, Technician Referring MD:              Medicines:                Monitored Anesthesia Care Complications:            No immediate complications. Estimated Blood Loss:     Estimated blood loss was minimal. Procedure:                Pre-Anesthesia Assessment:                           - Prior to the procedure, a History and Physical                            was performed, and patient medications and                            allergies were reviewed. The patient's tolerance of                            previous anesthesia was also reviewed. The risks                            and benefits of the procedure and the sedation                            options and risks were discussed with the patient.                            All questions were answered, and informed consent                            was obtained. Prior Anticoagulants: The patient                            last took Pletal (cilostazol) 5 days prior to the                            procedure. ASA  Grade Assessment: III - A patient                            with severe systemic disease. After reviewing the                            risks and benefits, the patient was deemed in                            satisfactory condition to undergo the procedure.                           After obtaining informed consent, the colonoscope                            was passed under direct vision.  Throughout the                            procedure, the patient's blood pressure, pulse, and                            oxygen saturations were monitored continuously. The                            CF-HQ190L ([id number]) Olympus adult colonoscope was                            introduced through the anus and advanced to the the                            cecum, identified by appendiceal orifice and                            ileocecal valve. The colonoscopy was performed                            without difficulty. The patient tolerated the                            procedure well. The quality of the bowel                            preparation was good. The ileocecal valve,                            appendiceal orifice, and rectum were photographed. Scope In: 11:36:25 AM Scope Out: 11:58:29 AM Total Procedure Duration: 0 hours 22 minutes 4 seconds  Findings:      The perianal and digital rectal examinations were normal.      A 2 mm polyp was found in the transverse colon. The polyp was sessile.       The polyp was removed with a cold biopsy forceps. Resection and       retrieval were complete.      A 5 mm polyp was found in the  descending colon. The polyp was sessile.       The polyp was removed with a cold snare. Resection and retrieval were       complete.      A few small-mouthed diverticula were found in the sigmoid colon and       descending colon.      Non-bleeding internal hemorrhoids were found during retroflexion. The       hemorrhoids were medium-sized. Impression:               - One 2 mm polyp in the transverse colon, removed                            with a cold biopsy forceps. Resected and retrieved.                           - One 5 mm polyp in the descending colon, removed                            with a cold snare. Resected and retrieved.                           - Diverticulosis in the sigmoid colon and in the                            descending colon.                            - Non-bleeding internal hemorrhoids. Moderate Sedation:      N/A Recommendation:           - Patient has a contact number available for                            emergencies. The signs and symptoms of potential                            delayed complications were discussed with the                            patient. Return to normal activities tomorrow.                            Written discharge instructions were provided to the                            patient.                           - Resume previous diet.                           - Continue present medications.                           - Await pathology results.                           -  Repeat colonoscopy in 5 years for surveillance                            based on pathology results.                           - Resume Pletal (cilostazol) at prior dose                            tomorrow. Refer to managing physician for further                            adjustment of therapy. Procedure Code(s):        --- Professional ---                           [phone], Colonoscopy, flexible; with removal of                            tumor(s), polyp(s), or other lesion(s) by snare                            technique                           45380, 34, Colonoscopy, flexible; with biopsy,                            single or multiple Diagnosis Code(s):        --- Professional ---                           Z80.0, Family history of malignant neoplasm of                            digestive organs                           D12.3, Benign neoplasm of transverse colon (hepatic                            flexure or splenic flexure)                           D12.4, Benign neoplasm of descending colon                           K64.8, Other hemorrhoids                           K57.30, Diverticulosis of large intestine without                            perforation or abscess without bleeding CPT copyright 2018  American Medical Association. All rights reserved. The codes documented in this report are preliminary and upon coder review may  be revised to meet current compliance requirements. Mauri Pole,  MD 08/31/2018 12:04:08 PM This report has been signed electronically. Number of Addenda: 0

## 2018-08-31 NOTE — Anesthesia Preprocedure Evaluation (Signed)
Anesthesia Evaluation  Patient identified by MRN, date of birth, ID band Patient awake    Reviewed: Allergy & Precautions, NPO status , Patient's Chart, lab work & pertinent test results  History of Anesthesia Complications Negative for: history of anesthetic complications  Airway Mallampati: II  TM Distance: >3 FB Neck ROM: Full    Dental no notable dental hx. (+) Dental Advisory Given   Pulmonary sleep apnea and Continuous Positive Airway Pressure Ventilation ,    Pulmonary exam normal breath sounds clear to auscultation       Cardiovascular hypertension, Pt. on medications negative cardio ROS Normal cardiovascular exam Rhythm:Regular Rate:Normal     Neuro/Psych negative neurological ROS  negative psych ROS   GI/Hepatic Neg liver ROS,   Endo/Other  diabetesHypothyroidism Morbid obesity  Renal/GU negative Renal ROS     Musculoskeletal negative musculoskeletal ROS (+)   Abdominal (+) + obese,   Peds  Hematology negative hematology ROS (+)   Anesthesia Other Findings Day of surgery medications reviewed with the patient.  Reproductive/Obstetrics                             Anesthesia Physical  Anesthesia Plan  ASA: III  Anesthesia Plan: MAC   Post-op Pain Management:    Induction:   PONV Risk Score and Plan: 1 and Treatment may vary due to age or medical condition and Ondansetron  Airway Management Planned: Simple Face Mask  Additional Equipment:   Intra-op Plan:   Post-operative Plan:   Informed Consent: I have reviewed the patients History and Physical, chart, labs and discussed the procedure including the risks, benefits and alternatives for the proposed anesthesia with the patient or authorized representative who has indicated his/her understanding and acceptance.   Dental advisory given  Plan Discussed with: CRNA and Anesthesiologist  Anesthesia Plan Comments:          Anesthesia Quick Evaluation

## 2018-09-01 ENCOUNTER — Other Ambulatory Visit: Payer: Self-pay | Admitting: Internal Medicine

## 2018-09-01 DIAGNOSIS — I739 Peripheral vascular disease, unspecified: Secondary | ICD-10-CM

## 2018-09-01 DIAGNOSIS — F411 Generalized anxiety disorder: Secondary | ICD-10-CM

## 2018-09-01 MED ORDER — ALPRAZOLAM 0.5 MG PO TABS: 0.5000 mg | ORAL_TABLET | Freq: Three times a day (TID) | ORAL | 1 refills | Status: DC | PRN

## 2018-09-01 MED ORDER — CILOSTAZOL 50 MG PO TABS: 50.0000 mg | ORAL_TABLET | Freq: Two times a day (BID) | ORAL | 1 refills | Status: DC

## 2018-09-01 MED ORDER — VITAMIN D (ERGOCALCIFEROL) 1.25 MG (50000 UNIT) PO CAPS: 50000.0000 [IU] | ORAL_CAPSULE | ORAL | 0 refills | Status: DC

## 2018-09-02 ENCOUNTER — Encounter (HOSPITAL_COMMUNITY): Payer: Self-pay | Admitting: Gastroenterology

## 2018-09-04 ENCOUNTER — Telehealth: Payer: Self-pay | Admitting: Neurology

## 2018-09-04 MED ORDER — PROPRANOLOL HCL 20 MG PO TABS: 20.0000 mg | ORAL_TABLET | Freq: Three times a day (TID) | ORAL | 1 refills | Status: DC

## 2018-09-04 NOTE — Telephone Encounter (Signed)
-----   Message from Annamaria Helling, [place] sent at 08/03/2018 10:27 AM EDT ----- Call to see how doing on Primidone

## 2018-09-04 NOTE — Telephone Encounter (Signed)
Mychart message sent to patient.

## 2018-09-04 NOTE — Telephone Encounter (Signed)
Dr. Tat - please advise.  

## 2018-09-04 NOTE — Telephone Encounter (Signed)
Okay to start propranolol 20 mg tid

## 2018-09-11 DIAGNOSIS — G4733 Obstructive sleep apnea (adult) (pediatric): Secondary | ICD-10-CM | POA: Diagnosis not present

## 2018-09-14 ENCOUNTER — Encounter: Payer: Self-pay | Admitting: Gastroenterology

## 2018-09-17 ENCOUNTER — Other Ambulatory Visit: Payer: Self-pay | Admitting: Internal Medicine

## 2018-09-17 DIAGNOSIS — I10 Essential (primary) hypertension: Secondary | ICD-10-CM

## 2018-09-23 ENCOUNTER — Other Ambulatory Visit (INDEPENDENT_AMBULATORY_CARE_PROVIDER_SITE_OTHER): Payer: BLUE CROSS/BLUE SHIELD

## 2018-09-23 DIAGNOSIS — E1169 Type 2 diabetes mellitus with other specified complication: Secondary | ICD-10-CM | POA: Diagnosis not present

## 2018-09-23 DIAGNOSIS — E291 Testicular hypofunction: Secondary | ICD-10-CM

## 2018-09-23 DIAGNOSIS — E669 Obesity, unspecified: Secondary | ICD-10-CM | POA: Diagnosis not present

## 2018-09-23 LAB — TESTOSTERONE: Testosterone: 340.94 ng/dL (ref 300.00–890.00)

## 2018-09-28 ENCOUNTER — Ambulatory Visit: Payer: BLUE CROSS/BLUE SHIELD | Admitting: Endocrinology

## 2018-09-28 ENCOUNTER — Encounter: Payer: Self-pay | Admitting: Endocrinology

## 2018-09-28 VITALS — BP 118/64 | HR 90 | Ht 72.0 in | Wt 392.0 lb

## 2018-09-28 DIAGNOSIS — E1169 Type 2 diabetes mellitus with other specified complication: Secondary | ICD-10-CM | POA: Diagnosis not present

## 2018-09-28 DIAGNOSIS — R5382 Chronic fatigue, unspecified: Secondary | ICD-10-CM | POA: Diagnosis not present

## 2018-09-28 DIAGNOSIS — E291 Testicular hypofunction: Secondary | ICD-10-CM

## 2018-09-28 DIAGNOSIS — E669 Obesity, unspecified: Secondary | ICD-10-CM

## 2018-09-28 NOTE — Patient Instructions (Signed)
3 pumps daily on Androgel

## 2018-09-28 NOTE — Progress Notes (Signed)
Patient ID: Zachary Murray, male   DOB: 1966-02-05, 52 y.o.   MRN: [id number]           Reason for visit: Endocrinology follow-up   Chief complaint: Tiredness and stomach problems  History of Present Illness  PROBLEM 1  Hypogonadismwas first diagnosed in 11/18  Since about 2016 or so he has had complaints offatigue He also feels malaise and listless, mostly in the afternoons and did not feel like doing anything at that time Also has some decreased libido  The diagnosis was confirmed by the low free T level of 6.6 done in 5/19 [facility] was inappropriately low at 6.3  He was on a trial of CLOMIPHENE 25 mg 3 times a week With this his energy level was improving but only partially and his libido was slightly better too This is despite his testosterone level being as high as 661, his LH had gone up to 20  Because of the lack of adequate symptomatic improvement he is now on a trial of AndroGel that was prescribed in September However he did not start this until about 4 weeks ago He is applying this on his shoulders as directed  He still has some fatigue but not as much as when he was on clomiphene and his levels were higher Testosterone level is lower than before  Lab Results  Component Value Date   TESTOSTERONE 340.94 09/23/2018   TESTOSTERONE 516.00 07/21/2018   TESTOSTERONE 660.7 05/20/2018   TESTOSTERONE 267 03/30/2018    Prolactin level: 5.8  Lab Results  Component Value Date   LH 20.04 (H) 05/20/2018       PROBLEM 2: DIABETES: None  Diagnosis date: ?  2016  Previous history:  Although he was diagnosed to have diabetes in 2016 he was not able to tolerate metformin that was previously prescribed Also was tried on Gaines in 2017 and 2018 with some improved control He was tried on Bydureon in 06/2017 and continued until mil 3/19 d He was started on Ozempic and Tresiba insulin in 01/2018 by his PCP when his A1c was 7.8 and fasting glucose 165 Insulin was stopped in  5/19 since his blood sugar was averaging only 92 on his freestyle sensor at home  Recent history:     Non-insulin hypoglycemic drugs:   Ozempic 1 mg weekly  His most recent A1c was 6.5%   Current management, blood sugar patterns and problems identified:   Partly to improve his weight loss he has now been on a higher dose of Ozempic, 1 mg weekly  He has no nausea with this  He is still monitoring blood sugar with the freestyle libre system  His blood sugars are appearing to be somewhat better on an average and also has not had as many higher readings in the morning  However he does not think he has been planning his meals well and could do better with his diet overall  Still has only limited amount of exercise  No weight change recently  Lab glucose was 99       Monitors blood glucose:   Uses Freestyle libre, this was started in 01/2018         Blood Glucose readings from freestyle [place] reader download:   AVERAGE 112+/-21 with 98% of readings between 70-180   FASTING blood sugar recent range 83-146 Average blood sugar at any given time of the day ranges between 106 up to 129, highest after supper  Side effects from medications:  Diarrhea from  metformin   Hypoglycemia:  None         Meals:  Usually low-fat        Physical activity: exercise: walks some             Dietician visit: Most recent:     Never Weight control:  Wt Readings from Last 3 Encounters:  09/28/18 (!) 392 lb (177.8 kg)  08/31/18 (!) 390 lb (176.9 kg)  08/03/18 (!) 390 lb (176.9 kg)   Lab Results  Component Value Date   HGBA1C 6.5 07/27/2018   HGBA1C 6.0 05/14/2018   HGBA1C 7.8 02/11/2018   Lab Results  Component Value Date   MICROALBUR 3.5 (H) 03/30/2018   LDLCALC 99 05/14/2018   CREATININE 0.90 07/27/2018    HYPOTHYROIDISM:  He was previously given levothyroxine for high TSH in 12/16 but later stopped because of improved TSH  Because of his fatigue he has been started on  levothyroxine in 3/19 with only slight improvement in his fatigue He still complains of fatigue  He has been taking 75 mcg of levothyroxine, this was increased by Dr. Tamala Julian when his TSH was upper normal in July      Lab Results  Component Value Date   TSH 2.47 07/27/2018   TSH 4.24 05/20/2018   TSH 2.74 03/30/2018   FREET4 0.82 07/21/2018   FREET4 0.92 03/30/2018   FREET4 0.97 02/08/2016        Allergies as of 09/28/2018      Reactions   Metformin And Related Diarrhea   Wellbutrin [bupropion] Other (See Comments)   tremors   Lisinopril Cough      Medication List        Accurate as of 09/28/18 11:09 AM. Always use your most recent med list.          ALPRAZolam 0.5 MG tablet Commonly known as:  XANAX Take 1 tablet (0.5 mg total) by mouth 3 (three) times daily as needed. for anxiety   cilostazol 50 MG tablet Commonly known as:  PLETAL Take 1 tablet (50 mg total) by mouth 2 (two) times daily.   COSAMIN DS PO Take 1 tablet by mouth daily. 1500-1200   FREESTYLE LIBRE READER Devi 1 Act by Does not apply route daily.   FREESTYLE LIBRE SENSOR SYSTEM Misc 1 Act by Does not apply route daily.   Insulin Pen Needle 31G X 6 MM Misc Use as needed for insulin injection.   levothyroxine 75 MCG tablet Commonly known as:  SYNTHROID, LEVOTHROID Take 1 tablet (75 mcg total) by mouth daily.   propranolol 20 MG tablet Commonly known as:  INDERAL Take 1 tablet (20 mg total) by mouth 3 (three) times daily.   Semaglutide (1 MG/DOSE) 2 MG/1.5ML Sopn Inject 1 mg into the skin once a week.   triamterene-hydrochlorothiazide 37.5-25 MG tablet Commonly known as:  MAXZIDE-25 TAKE 1 TABLET BY MOUTH EVERY DAY   Vitamin D (Ergocalciferol) 1.25 MG (50000 UT) Caps capsule Commonly known as:  DRISDOL Take 1 capsule (50,000 Units total) by mouth once a week.       Allergies:  Allergies  Allergen Reactions  . Metformin And Related Diarrhea  . Wellbutrin [Bupropion]  Other (See Comments)    tremors  . Lisinopril Cough    Past Medical History:  Diagnosis Date  . Hypertension   . Obesity   . OSA on CPAP   . Type II diabetes mellitus with manifestations [facility])     Past Surgical History:  Procedure Laterality  Date  . BIOPSY  08/31/2018   Procedure: BIOPSY;  Surgeon: Mauri Pole, MD;  Location: [facility];  Service: Endoscopy;;  . COLONOSCOPY WITH PROPOFOL N/A 11/12/2016   Procedure: COLONOSCOPY WITH PROPOFOL;  Surgeon: Mauri Pole, MD;  Location: [facility];  Service: Endoscopy;  Laterality: N/A;  . COLONOSCOPY WITH PROPOFOL N/A 08/31/2018   Procedure: COLONOSCOPY WITH PROPOFOL;  Surgeon: Mauri Pole, MD;  Location: [facility];  Service: Endoscopy;  Laterality: N/A;  . POLYPECTOMY  08/31/2018   Procedure: POLYPECTOMY;  Surgeon: Mauri Pole, MD;  Location: [facility];  Service: Endoscopy;;  . WISDOM TOOTH EXTRACTION      Family History  Problem Relation Age of Onset  . Colon cancer Father   . Hypertension Father   . Thyroid cancer Mother   . Colon polyps Mother   . Thyroid disease Mother   . Thyroid disease Sister   . Thyroid disease Maternal Grandmother   . Alcohol abuse Neg Hx   . COPD Neg Hx   . Diabetes Neg Hx   . Early death Neg Hx   . Hearing loss Neg Hx   . Heart disease Neg Hx   . Hyperlipidemia Neg Hx   . Kidney disease Neg Hx   . Stroke Neg Hx     Social History:  reports that he has never smoked. His smokeless tobacco use includes chew. He reports that he does not drink alcohol or use drugs.  Review of Systems    LIPIDS: LDL below 100, not on a statin drug Followed by PCP  Lab Results  Component Value Date   CHOL 157 05/14/2018   HDL 41.40 05/14/2018   LDLCALC 99 05/14/2018   TRIG 79.0 05/14/2018   CHOLHDL 4 05/14/2018   Asking about gaseousness, some loose stools since colonoscopy but not otherwise even with increasing her Ozempic  General Examination:   BP 118/64    Pulse 90   Ht 6' (1.829 m)   Wt (!) 392 lb (177.8 kg)   SpO2 99%   BMI 53.16 kg/m     Assessment/ Plan:   HYPOGONADOTROPIC HYPOGONADISM with symptoms of fatigue and low free testosterone at baseline  He has been symptomatic with fatigue and decreased libido  His testosterone level is below 400 and he still has some fatigue He does need to increase his AndroGel from 2 pumps up to 3 pumps daily for better efficacy Discussed that clomiphene may not be a good long-term option since he did not get relief of his symptoms despite adequate testosterone levels and also his LH going up significantly high at 20  He will follow-up in 1 month with testosterone level  FATIGUE: May be multifactorial  GI symptoms: Not related to [place] as this started after his colonoscopy and he will discuss with his gastroenterologist  DIABETES with morbid obesity  See history of present illness for detailed discussion of current diabetes management, blood sugar patterns and problems identified His baseline A1c was 7.8 and most recently 6.5  With Ozempic his blood sugars are excellent including after meals However has difficulty losing weight He thinks he can do better with diet and exercise  HYPOTHYROIDISM: Thyroid levels are normal with 75 mcg daily  HYPERTENSION followed by PCP  Total visit time for evaluation and management of multiple problems and counseling =25 minutes  Zachary Murray 09/28/2018, 11:09 AM     Note: This office note was prepared with Dragon voice recognition system technology. Any transcriptional errors that result  from this process are unintentional.     Zachary Murray 09/28/2018, 11:09 AM

## 2018-09-30 ENCOUNTER — Telehealth: Payer: Self-pay | Admitting: Neurology

## 2018-09-30 NOTE — Telephone Encounter (Signed)
Mychart message sent to patient.

## 2018-10-26 ENCOUNTER — Other Ambulatory Visit: Payer: Self-pay | Admitting: Neurology

## 2018-10-27 DIAGNOSIS — E119 Type 2 diabetes mellitus without complications: Secondary | ICD-10-CM | POA: Diagnosis not present

## 2018-10-27 DIAGNOSIS — H3509 Other intraretinal microvascular abnormalities: Secondary | ICD-10-CM | POA: Diagnosis not present

## 2018-10-27 DIAGNOSIS — H2513 Age-related nuclear cataract, bilateral: Secondary | ICD-10-CM | POA: Diagnosis not present

## 2018-10-27 DIAGNOSIS — H524 Presbyopia: Secondary | ICD-10-CM | POA: Diagnosis not present

## 2018-10-27 DIAGNOSIS — H25013 Cortical age-related cataract, bilateral: Secondary | ICD-10-CM | POA: Diagnosis not present

## 2018-10-27 LAB — HM DIABETES EYE EXAM

## 2018-10-29 ENCOUNTER — Other Ambulatory Visit (INDEPENDENT_AMBULATORY_CARE_PROVIDER_SITE_OTHER): Payer: BLUE CROSS/BLUE SHIELD

## 2018-10-29 DIAGNOSIS — E291 Testicular hypofunction: Secondary | ICD-10-CM

## 2018-10-29 LAB — TESTOSTERONE: Testosterone: 219.89 ng/dL — ABNORMAL LOW (ref 300.00–890.00)

## 2018-11-06 ENCOUNTER — Telehealth: Payer: Self-pay | Admitting: Endocrinology

## 2018-11-06 NOTE — Telephone Encounter (Signed)
Patient requests a call back at ph# [phone] is having a medication problem with testosterone. Patient would like a call back asap.

## 2018-11-06 NOTE — Telephone Encounter (Signed)
Sent MyChart message for MD to address

## 2018-11-08 ENCOUNTER — Other Ambulatory Visit: Payer: Self-pay | Admitting: Endocrinology

## 2018-11-08 MED ORDER — TESTOSTERONE 1.62 % TD GEL: 6.0000 | Freq: Every day | TRANSDERMAL | 1 refills | Status: DC

## 2018-11-09 ENCOUNTER — Telehealth: Payer: Self-pay | Admitting: Endocrinology

## 2018-11-09 NOTE — Telephone Encounter (Signed)
Patient called and asked to have the Testosterone 1.62 % GEL  Sent to the [facility] in [place] , [address].  He is out of town and this is one time fill pharmacy fill  Please contact patient at [phone] to advise when completed. Patient stated we can also contact him via mychart as well.

## 2018-11-09 NOTE — Telephone Encounter (Signed)
Please refill if appropriate

## 2018-11-12 ENCOUNTER — Telehealth: Payer: Self-pay

## 2018-11-12 NOTE — Telephone Encounter (Signed)
PA submitted via [email or web address] for Testosterone 1.62% gel pump. [id number]

## 2018-11-16 ENCOUNTER — Telehealth: Payer: Self-pay | Admitting: Endocrinology

## 2018-11-16 ENCOUNTER — Other Ambulatory Visit: Payer: Self-pay

## 2018-11-16 DIAGNOSIS — E118 Type 2 diabetes mellitus with unspecified complications: Secondary | ICD-10-CM

## 2018-11-16 MED ORDER — FREESTYLE LIBRE SENSOR SYSTEM MISC: 1.0000 | Freq: Every day | 4 refills | Status: DC

## 2018-11-16 NOTE — Telephone Encounter (Signed)
Rx sent 

## 2018-11-16 NOTE — Telephone Encounter (Signed)
MEDICATION: libre sensors  PHARMACY: [facility] in [place]    IS THIS A 90 DAY SUPPLY : yes  IS PATIENT OUT OF MEDICATION: yes  IF NOT; HOW MUCH IS LEFT:   LAST APPOINTMENT DATE: @12 /26/2019  NEXT APPOINTMENT DATE:@2 /08/2019  DO WE HAVE YOUR PERMISSION TO LEAVE A DETAILED MESSAGE:  OTHER COMMENTS: the rx just needs to be called into this new pharmacy because he is changing pharmacies   **Let patient know to contact pharmacy at the end of the day to make sure medication is ready. **  ** Please notify patient to allow 48-72 hours to process**  **Encourage patient to contact the pharmacy for refills or they can request refills through [facility]**

## 2018-11-20 ENCOUNTER — Other Ambulatory Visit: Payer: Self-pay | Admitting: Internal Medicine

## 2018-12-17 ENCOUNTER — Other Ambulatory Visit: Payer: Self-pay | Admitting: Internal Medicine

## 2018-12-17 DIAGNOSIS — I10 Essential (primary) hypertension: Secondary | ICD-10-CM

## 2018-12-23 ENCOUNTER — Other Ambulatory Visit: Payer: Self-pay | Admitting: Endocrinology

## 2018-12-23 DIAGNOSIS — E291 Testicular hypofunction: Secondary | ICD-10-CM

## 2018-12-23 DIAGNOSIS — E669 Obesity, unspecified: Principal | ICD-10-CM

## 2018-12-23 DIAGNOSIS — E1169 Type 2 diabetes mellitus with other specified complication: Secondary | ICD-10-CM

## 2018-12-28 ENCOUNTER — Other Ambulatory Visit (INDEPENDENT_AMBULATORY_CARE_PROVIDER_SITE_OTHER): Payer: BLUE CROSS/BLUE SHIELD

## 2018-12-28 DIAGNOSIS — E669 Obesity, unspecified: Secondary | ICD-10-CM

## 2018-12-28 DIAGNOSIS — E291 Testicular hypofunction: Secondary | ICD-10-CM | POA: Diagnosis not present

## 2018-12-28 DIAGNOSIS — E1169 Type 2 diabetes mellitus with other specified complication: Secondary | ICD-10-CM

## 2018-12-28 LAB — LIPID PANEL
Cholesterol: 127 mg/dL (ref 0–200)
HDL: 38 mg/dL — ABNORMAL LOW (ref >39.00)
LDL Cholesterol: 78 mg/dL (ref 0–99)
NonHDL: 89.14
Total CHOL/HDL Ratio: 3
Triglycerides: 55 mg/dL (ref 0.0–149.0)
VLDL: 11 mg/dL (ref 0.0–40.0)

## 2018-12-28 LAB — COMPREHENSIVE METABOLIC PANEL
ALT: 24 U/L (ref 0–53)
AST: 23 U/L (ref 0–37)
Albumin: 4.3 g/dL (ref 3.5–5.2)
Alkaline Phosphatase: 70 U/L (ref 39–117)
BUN: 12 mg/dL (ref 6–23)
CO2: 32 mEq/L (ref 19–32)
Calcium: 9.7 mg/dL (ref 8.4–10.5)
Chloride: 99 mEq/L (ref 96–112)
Creatinine, Ser: 0.91 mg/dL (ref 0.40–1.50)
GFR: 86.47 mL/min (ref >60.00)
Glucose, Bld: 127 mg/dL — ABNORMAL HIGH (ref 70–99)
Potassium: 4.2 mEq/L (ref 3.5–5.1)
Sodium: 138 mEq/L (ref 135–145)
Total Bilirubin: 1 mg/dL (ref 0.2–1.2)
Total Protein: 7 g/dL (ref 6.0–8.3)

## 2018-12-28 LAB — CBC
HCT: 50.3 % (ref 39.0–52.0)
Hemoglobin: 16.9 g/dL (ref 13.0–17.0)
MCHC: 33.6 g/dL (ref 30.0–36.0)
MCV: 89.6 fl (ref 78.0–100.0)
Platelets: 243 10*3/uL (ref 150.0–400.0)
RBC: 5.61 Mil/uL (ref 4.22–5.81)
RDW: 14.3 % (ref 11.5–15.5)
WBC: 7.1 10*3/uL (ref 4.0–10.5)

## 2018-12-28 LAB — TESTOSTERONE: Testosterone: 679.47 ng/dL (ref 300.00–890.00)

## 2018-12-28 LAB — HEMOGLOBIN A1C: Hgb A1c MFr Bld: 6.6 % — ABNORMAL HIGH (ref 4.6–6.5)

## 2018-12-29 ENCOUNTER — Other Ambulatory Visit: Payer: BLUE CROSS/BLUE SHIELD

## 2018-12-29 NOTE — Progress Notes (Deleted)
Subjective:   Zachary Murray was seen in consultation in the movement disorder clinic for follow-up of essential tremor.  He was started on low-dose primidone.  We called him a month after we started the medication and the patient stated that it helped with hand tremor, but he stopped taking it not long after he started it.  He stated that he already "had a problem with fatigue" and primidone made it worse (records review indicates long history of fatigue, for which she has been treated with thyroid medication, and testosterone).  We subsequently started propranolol, 20 mg 3 times per day.  We did email him to see how he was doing, but did not get a response back.  Allergies  Allergen Reactions  . Metformin And Related Diarrhea  . Wellbutrin [Bupropion] Other (See Comments)    tremors  . Lisinopril Cough    Outpatient Encounter Medications as of 01/07/2019  Medication Sig  . ALPRAZolam (XANAX) 0.5 MG tablet Take 1 tablet (0.5 mg total) by mouth 3 (three) times daily as needed. for anxiety  . cilostazol (PLETAL) 50 MG tablet Take 1 tablet (50 mg total) by mouth 2 (two) times daily.  . Continuous Blood Gluc Receiver (FREESTYLE LIBRE READER) DEVI 1 Act by Does not apply route daily.  . Continuous Blood Gluc Sensor (FREESTYLE LIBRE SENSOR SYSTEM) MISC 1 Act by Does not apply route daily.  . Glucosamine-Chondroitin (COSAMIN DS PO) Take 1 tablet by mouth daily. 1500-1200  . Insulin Pen Needle (RELION PEN NEEDLES) 31G X 6 MM MISC Use as needed for insulin injection.  Marland Kitchen levothyroxine (SYNTHROID, LEVOTHROID) 75 MCG tablet Take 1 tablet (75 mcg total) by mouth daily.  . propranolol (INDERAL) 20 MG tablet TAKE 1 TABLET(20 MG) BY MOUTH THREE TIMES DAILY  . Semaglutide, 1 MG/DOSE, (OZEMPIC, 1 MG/DOSE,) 2 MG/1.5ML SOPN Inject 1 mg into the skin once a week. (Patient taking differently: Inject 1 mg into the skin every Tuesday. )  . Testosterone 1.62 % GEL Place 6 Pump onto the skin daily.  Marland Kitchen  triamterene-hydrochlorothiazide (MAXZIDE-25) 37.5-25 MG tablet TAKE 1 TABLET BY MOUTH EVERY DAY  . Vitamin D, Ergocalciferol, (DRISDOL) 1.25 MG (50000 UT) CAPS capsule TAKE 1 CAPSULE BY MOUTH ONCE A WEEK   No facility-administered encounter medications on file as of 01/07/2019.     Past Medical History:  Diagnosis Date  . Hypertension   . Obesity   . OSA on CPAP   . Type II diabetes mellitus with manifestations [facility])     Past Surgical History:  Procedure Laterality Date  . BIOPSY  08/31/2018   Procedure: BIOPSY;  Surgeon: Mauri Pole, MD;  Location: [facility];  Service: Endoscopy;;  . COLONOSCOPY WITH PROPOFOL N/A 11/12/2016   Procedure: COLONOSCOPY WITH PROPOFOL;  Surgeon: Mauri Pole, MD;  Location: [facility];  Service: Endoscopy;  Laterality: N/A;  . COLONOSCOPY WITH PROPOFOL N/A 08/31/2018   Procedure: COLONOSCOPY WITH PROPOFOL;  Surgeon: Mauri Pole, MD;  Location: [facility];  Service: Endoscopy;  Laterality: N/A;  . POLYPECTOMY  08/31/2018   Procedure: POLYPECTOMY;  Surgeon: Mauri Pole, MD;  Location: [facility];  Service: Endoscopy;;  . WISDOM TOOTH EXTRACTION      Social History   Socioeconomic History  . Marital status: Single    Spouse name: Not on file  . Number of children: Not on file  . Years of education: Not on file  . Highest education level: Not on file  Occupational History  .  Occupation: unemployed  Social Needs  . Financial resource strain: Not on file  . Food insecurity:    Worry: Not on file    Inability: Not on file  . Transportation needs:    Medical: Not on file    Non-medical: Not on file  Tobacco Use  . Smoking status: Never Smoker  . Smokeless tobacco: Current User    Types: Chew  Substance and Sexual Activity  . Alcohol use: No    Alcohol/week: 0.0 standard drinks    Comment: hx of alcohol abuse - quit 2017  . Drug use: No  . Sexual activity: Not Currently  Lifestyle  . Physical activity:      Days per week: Not on file    Minutes per session: Not on file  . Stress: Not on file  Relationships  . Social connections:    Talks on phone: Not on file    Gets together: Not on file    Attends religious service: Not on file    Active member of club or organization: Not on file    Attends meetings of clubs or organizations: Not on file    Relationship status: Not on file  . Intimate partner violence:    Fear of current or ex partner: Not on file    Emotionally abused: Not on file    Physically abused: Not on file    Forced sexual activity: Not on file  Other Topics Concern  . Not on file  Social History Narrative  . Not on file    Family Status  Relation Name Status  . Father  Alive  . Mother  Alive  . Sister  Alive  . MGM  (Not Specified)  . Neg Hx  (Not Specified)    Review of Systems Review of Systems  Constitutional: Positive for malaise/fatigue (chronic).  HENT: Negative.   Eyes: Negative.   Respiratory: Negative.   Cardiovascular: Negative.   Gastrointestinal: Negative.  Negative for blood in stool.  Genitourinary: Negative.  Negative for hematuria.  Musculoskeletal: Positive for back pain.  Skin: Negative.   Neurological: Positive for tremors.  Endo/Heme/Allergies: Negative.      Objective:   VITALS:   There were no vitals filed for this visit. Gen:  Appears stated age and in NAD. HEENT:  Normocephalic, atraumatic. The mucous membranes are moist. The superficial temporal arteries are without ropiness or tenderness. Cardiovascular: tachy.  regular Lungs: Clear to auscultation bilaterally. Neck: There are no carotid bruits noted bilaterally.  NEUROLOGICAL:  Orientation:  The patient is alert and oriented x 3.  Recent and remote memory are intact.  Attention span and concentration are normal.  Able to name objects and repeat without trouble.  Fund of knowledge is appropriate Cranial nerves: There is good facial symmetry. The pupils are equal round  and reactive to light bilaterally. Fundoscopic exam reveals clear disc margins bilaterally. Extraocular muscles are intact and visual fields are full to confrontational testing. Speech is fluent and clear. Soft palate rises symmetrically and there is no tongue deviation. Hearing is intact to conversational tone. Tone: Tone is good throughout. Sensation: Sensation is intact to light touch and pinprick throughout (facial, trunk, extremities). Vibration is intact at the bilateral big toe. There is no extinction with double simultaneous stimulation. There is no sensory dermatomal level identified. Coordination:  The patient has no dysdiadichokinesia or dysmetria. Motor: Strength is 5/5 in the bilateral upper and lower extremities.  Shoulder shrug is equal bilaterally.  There is no pronator  drift.  There are no fasciculations noted. DTR's: Deep tendon reflexes are 2-/4 at the bilateral biceps, triceps, brachioradialis, patella and achilles.  Plantar responses are downgoing bilaterally. Gait and Station: The patient initially ambulates with an antalgic gait but better the longer he walks  MOVEMENT EXAM: Tremor:  There is postural and intention tremor.   There is head tremor in the "yes" direction (has head turned to the left but no null point).  The patient is not able to draw Archimedes spirals without significant difficulty.  There is mild intermittent tremor at rest.  The patient is not able to pour water from one glass to another without spilling it.  Labs:   Chemistry      Component Value Date/Time   NA 138 12/28/2018 1104   K 4.2 12/28/2018 1104   CL 99 12/28/2018 1104   CO2 32 12/28/2018 1104   BUN 12 12/28/2018 1104   CREATININE 0.91 12/28/2018 1104      Component Value Date/Time   CALCIUM 9.7 12/28/2018 1104   ALKPHOS 70 12/28/2018 1104   AST 23 12/28/2018 1104   ALT 24 12/28/2018 1104   BILITOT 1.0 12/28/2018 1104     Lab Results  Component Value Date   TSH 2.47 07/27/2018          Assessment/Plan:   1.  Essential Tremor.  -This is evidenced by the symmetrical nature and longstanding hx of gradually getting worse.  We discussed nature and pathophysiology.  We discussed that this can continue to gradually get worse with time.  We discussed that some medications can worsen this, as can caffeine use.  We discussed medication therapy as well as surgical therapy.  Ultimately, the patient decided to try low dose primidone.  Told pt that we will call him in a month to see how doing and likely increase his medication at that time.  He is hesitant about trying beta-blockers because he read about side effects, although it could potentially help the tachycardia he had in the office as well.  I did tell him that medications likely will not help head tremor.  -Patient was shown HIPAA compliant videos of patients who have had DBS surgery for essential tremor.  He is interested in therapy.  We will continue to leave that as an option, but he really needs to try medication therapy first.  2.  Hx alcoholism  -sober x 2 years  3.  Follow up is anticipated in the next few months, sooner should new neurologic issues arise.  CC:  [name], MD

## 2018-12-30 ENCOUNTER — Encounter: Payer: Self-pay | Admitting: Endocrinology

## 2018-12-30 ENCOUNTER — Ambulatory Visit: Payer: BLUE CROSS/BLUE SHIELD | Admitting: Endocrinology

## 2018-12-30 VITALS — BP 110/72 | HR 95 | Ht 72.0 in | Wt 399.6 lb

## 2018-12-30 DIAGNOSIS — E1169 Type 2 diabetes mellitus with other specified complication: Secondary | ICD-10-CM

## 2018-12-30 DIAGNOSIS — E039 Hypothyroidism, unspecified: Secondary | ICD-10-CM

## 2018-12-30 DIAGNOSIS — E669 Obesity, unspecified: Secondary | ICD-10-CM

## 2018-12-30 DIAGNOSIS — E291 Testicular hypofunction: Secondary | ICD-10-CM

## 2018-12-30 LAB — GLUCOSE, POCT (MANUAL RESULT ENTRY): POC Glucose: 132 mg/dl — AB (ref 70–99)

## 2018-12-30 NOTE — Patient Instructions (Signed)
Exercise   Gel is total 5 pumps daily

## 2018-12-30 NOTE — Progress Notes (Addendum)
Patient ID: Zachary Murray, male   DOB: 08-04-1966, 53 y.o.   MRN: [id number]            Chief complaint: Endocrinology follow-up  History of Present Illness  PROBLEM 1  Hypogonadismwas first diagnosed in 11/18  Since about 2016 or so he has had complaints offatigue He also feels malaise and listless, mostly in the afternoons and did not feel like doing anything at that time Also has some decreased libido  The diagnosis was confirmed by the low free T level of 6.6 done in 5/19 [facility] was inappropriately low at 6.3  He was on a trial of CLOMIPHENE 25 mg 3 times a week With this his energy level was improving but only partially and his libido was slightly better too This is despite his testosterone level being as high as 661, his LH had gone up to 20  Because of the lack of adequate symptomatic improvement he is taking AndroGel since 9/19 His last level was low at 220 but he had only started the treatment about 4 weeks prior to his visit  He has increased the dose to 3 pumps on each arm, previously on 2 pumps on each arm With this he is feeling significantly better with his energy level although at times will feel fatigued and sleepy He thinks his endurance is better also However has not lost any weight Testosterone level is significantly higher at 680  However his hemoglobin is 16.9  Lab Results  Component Value Date   TESTOSTERONE 679.47 12/28/2018   TESTOSTERONE 219.89 (L) 10/29/2018   TESTOSTERONE 340.94 09/23/2018   TESTOSTERONE 516.00 07/21/2018    Prolactin level: 5.8  Lab Results  Component Value Date   LH 20.04 (H) 05/20/2018   Lab Results  Component Value Date   HGB 16.9 12/28/2018     PROBLEM 2: DIABETES: None  Diagnosis date: ?  2016  Previous history:  Although he was diagnosed to have diabetes in 2016 he was not able to tolerate metformin that was previously prescribed Also was tried on Slayden in 2017 and 2018 with some improved control He was  tried on Bydureon in 06/2017 and continued until mil 3/19 d He was started on Ozempic and Tresiba insulin in 01/2018 by his PCP when his A1c was 7.8 and fasting glucose 165 Insulin was stopped in 5/19 since his blood sugar was averaging only 92 on his freestyle sensor at home  Recent history:     Non-insulin hypoglycemic drugs:   Ozempic 1 mg weekly  His most recent A1c 6.6 compared to 6.5%   Current management, blood sugar patterns and problems identified:   He did not bring his freestyle libre for evaluation today  He thinks his blood sugars are about the same as before when they were averaging about 112  He has however gained weight and is not still motivated to exercise even though he thinks he feels better  He is trying to keep animal fats down but may not be always controlling total calorie intake       Monitors blood glucose:   Uses Freestyle libre, this was started in 01/2018         Blood Glucose readings from freestyle libre reader download:   Currently not available  Side effects from medications:  Diarrhea from metformin         Meals:  Usually low-fat        Physical activity: exercise: Very little walking  Dietician visit: Most recent:     Never Weight control:  Wt Readings from Last 3 Encounters:  12/30/18 (!) 399 lb 9.6 oz (181.3 kg)  09/28/18 (!) 392 lb (177.8 kg)  08/31/18 (!) 390 lb (176.9 kg)   Lab Results  Component Value Date   HGBA1C 6.6 (H) 12/28/2018   HGBA1C 6.5 07/27/2018   HGBA1C 6.0 05/14/2018   Lab Results  Component Value Date   MICROALBUR 3.5 (H) 03/30/2018   LDLCALC 78 12/28/2018   CREATININE 0.91 12/28/2018    HYPOTHYROIDISM:  He was previously given levothyroxine for high TSH in 12/16 but later stopped because of improved TSH  Because of his fatigue he has been started on levothyroxine in 3/19 with only slight improvement in his fatigue He still complains of fatigue  He has been taking 75 mcg of levothyroxine,  this was increased by Dr. Tamala Julian when his TSH was upper normal in July 2019 TSH subsequently consistently normal     Lab Results  Component Value Date   TSH 2.47 07/27/2018   TSH 4.24 05/20/2018   TSH 2.74 03/30/2018   FREET4 0.82 07/21/2018   FREET4 0.92 03/30/2018   FREET4 0.97 02/08/2016        Allergies as of 12/30/2018      Reactions   Metformin And Related Diarrhea   Wellbutrin [bupropion] Other (See Comments)   tremors   Lisinopril Cough      Medication List       Accurate as of December 30, 2018  8:29 AM. Always use your most recent med list.        ALPRAZolam 0.5 MG tablet Commonly known as:  XANAX Take 1 tablet (0.5 mg total) by mouth 3 (three) times daily as needed. for anxiety   cilostazol 50 MG tablet Commonly known as:  PLETAL Take 1 tablet (50 mg total) by mouth 2 (two) times daily.   COSAMIN DS PO Take 1 tablet by mouth daily. 1500-1200   FREESTYLE LIBRE READER Devi 1 Act by Does not apply route daily.   FREESTYLE LIBRE SENSOR SYSTEM Misc 1 Act by Does not apply route daily.   Insulin Pen Needle 31G X 6 MM Misc Commonly known as:  RELION PEN NEEDLES Use as needed for insulin injection.   levothyroxine 75 MCG tablet Commonly known as:  SYNTHROID, LEVOTHROID Take 1 tablet (75 mcg total) by mouth daily.   propranolol 20 MG tablet Commonly known as:  INDERAL TAKE 1 TABLET(20 MG) BY MOUTH THREE TIMES DAILY   Semaglutide (1 MG/DOSE) 2 MG/1.5ML Sopn Commonly known as:  OZEMPIC (1 MG/DOSE) Inject 1 mg into the skin once a week.   Testosterone 1.62 % Gel Place 6 Pump onto the skin daily.   triamterene-hydrochlorothiazide 37.5-25 MG tablet Commonly known as:  MAXZIDE-25 TAKE 1 TABLET BY MOUTH EVERY DAY   Vitamin D (Ergocalciferol) 1.25 MG (50000 UT) Caps capsule Commonly known as:  DRISDOL TAKE 1 CAPSULE BY MOUTH ONCE A WEEK       Allergies:  Allergies  Allergen Reactions  . Metformin And Related Diarrhea  . Wellbutrin  [Bupropion] Other (See Comments)    tremors  . Lisinopril Cough    Past Medical History:  Diagnosis Date  . Hypertension   . Obesity   . OSA on CPAP   . Type II diabetes mellitus with manifestations [facility])     Past Surgical History:  Procedure Laterality Date  . BIOPSY  08/31/2018   Procedure: BIOPSY;  Surgeon: Harl Bowie  V, MD;  [facility]: [facility];  Service: Endoscopy;;  . COLONOSCOPY WITH PROPOFOL N/A 11/12/2016   Procedure: COLONOSCOPY WITH PROPOFOL;  Surgeon: Mauri Pole, MD;  Location: [facility];  Service: Endoscopy;  Laterality: N/A;  . COLONOSCOPY WITH PROPOFOL N/A 08/31/2018   Procedure: COLONOSCOPY WITH PROPOFOL;  Surgeon: Mauri Pole, MD;  Location: [facility];  Service: Endoscopy;  Laterality: N/A;  . POLYPECTOMY  08/31/2018   Procedure: POLYPECTOMY;  Surgeon: Mauri Pole, MD;  Location: [facility];  Service: Endoscopy;;  . WISDOM TOOTH EXTRACTION      Family History  Problem Relation Age of Onset  . Colon cancer Father   . Hypertension Father   . Thyroid cancer Mother   . Colon polyps Mother   . Thyroid disease Mother   . Thyroid disease Sister   . Thyroid disease Maternal Grandmother   . Alcohol abuse Neg Hx   . COPD Neg Hx   . Diabetes Neg Hx   . Early death Neg Hx   . Hearing loss Neg Hx   . Heart disease Neg Hx   . Hyperlipidemia Neg Hx   . Kidney disease Neg Hx   . Stroke Neg Hx     Social History:  reports that he has never smoked. His smokeless tobacco use includes chew. He reports that he does not drink alcohol or use drugs.  Review of Systems    LIPIDS: LDL below 100, not on a statin drug Followed by PCP  Lab Results  Component Value Date   CHOL 127 12/28/2018   HDL 38.00 (L) 12/28/2018   LDLCALC 78 12/28/2018   TRIG 55.0 12/28/2018   CHOLHDL 3 12/28/2018     General Examination:   BP 110/72 (BP Location: Left Arm, Patient Position: Sitting, Cuff Size: Large)   Pulse 95   Ht 6' (1.829 m)    Wt (!) 399 lb 9.6 oz (181.3 kg)   SpO2 94%   BMI 54.20 kg/m     Assessment/ Plan:   HYPOGONADOTROPIC HYPOGONADISM with symptoms of fatigue and low free testosterone at baseline  He has been symptomatic with fatigue and decreased libido  He is now taking 6 pumps daily of the AndroGel He is concerned about the difficulty applying this because it does not dry and the large volume he is using However with current dosage his testosterone level is excellent but probably higher than his target of about 400-500 Also discussed that his hemoglobin is starting to  be high normal and potential risks with this  Discussed various options for testosterone supplementation including injections, pellet, other testosterone prescriptions and Androderm patch Androderm is not covered currently  He does need to currently reduce the dosage to 5 pumps daily He will check the coverage for Axiron and Testopel and let us know if he wants to change  FATIGUE: Improved with better testosterone levels but still present and he thinks it may be related to his Inderal. He will discuss Inderal dose with his neurologist  DIABETES with morbid obesity  See history of present illness for discussion of current diabetes management, blood sugar patterns and problems identified  His A1c is consistently controlled with Ozempic and A1c now 6.5 He did not bring his monitor for download but he does not think he has consistently high readings  However lack of weight loss is a problem  He thinks he can do better with diet and exercise and discussed need to change the routine May consider using SGLT2 drug  Offered consultation with dietitian  HYPOTHYROIDISM: Thyroid levels are normal with 75 mcg daily  HYPERTENSION followed by PCP  Total visit time for evaluation and management of multiple problems and counseling =25 minutes  Elayne Snare 12/30/2018, 8:29 AM     Note: This office note was prepared with Dragon voice  recognition system technology. Any transcriptional errors that result from this process are unintentional.     Elayne Snare 12/30/2018, 8:29 AM

## 2019-01-04 ENCOUNTER — Ambulatory Visit: Payer: BLUE CROSS/BLUE SHIELD | Admitting: Endocrinology

## 2019-01-05 ENCOUNTER — Ambulatory Visit: Payer: BLUE CROSS/BLUE SHIELD | Admitting: Neurology

## 2019-01-05 ENCOUNTER — Encounter: Payer: Self-pay | Admitting: Neurology

## 2019-01-05 VITALS — BP 142/80 | HR 84 | Ht 74.0 in | Wt 399.0 lb

## 2019-01-05 DIAGNOSIS — G250 Essential tremor: Secondary | ICD-10-CM | POA: Diagnosis not present

## 2019-01-05 MED ORDER — PROPRANOLOL HCL 40 MG PO TABS: 40.0000 mg | ORAL_TABLET | Freq: Three times a day (TID) | ORAL | 1 refills | Status: DC

## 2019-01-05 NOTE — Patient Instructions (Addendum)
Take propranolol 40 mg - twice per day (8am/5pm).  IF you tolerate this and still feel like you have tremor, you can increase this to 3 times per day (8am/noon/5pm) in 3 weeks

## 2019-01-05 NOTE — Progress Notes (Signed)
Subjective:   Zachary Murray was seen in consultation in the movement disorder clinic for follow-up of essential tremor.  Last visit, primidone was started.  He reported that it helped the hand tremor, but made his fatigue worse.  Reports today that it made him dizzy.  He was agreeable then to starting propranolol, 20 mg twice daily.  We called him back to see how he was doing about a month later, but he did not answer that telephone call.  He returns today on 20 mg 3 times per day.  He reports today that hand tremor is definitely improved but "I would like to see them better controlled."  He states that head tremor is worse but "more episodic."  He states that he is still fatigued but thinks that he would like to come off of inderal to see how much is really the Inderal and how much may be other factors.  He does state that his testosterone level is now "leveled out" and he thinks that his fatigue is no longer related to this.  Allergies  Allergen Reactions  . Metformin And Related Diarrhea  . Wellbutrin [Bupropion] Other (See Comments)    tremors  . Lisinopril Cough    Outpatient Encounter Medications as of 01/05/2019  Medication Sig  . 5-Hydroxytryptophan (5-HTP PO) Take 2 tablets by mouth daily.  Marland Kitchen ALPRAZolam (XANAX) 0.5 MG tablet Take 1 tablet (0.5 mg total) by mouth 3 (three) times daily as needed. for anxiety  . cilostazol (PLETAL) 50 MG tablet Take 1 tablet (50 mg total) by mouth 2 (two) times daily.  . Continuous Blood Gluc Receiver (FREESTYLE LIBRE READER) DEVI 1 Act by Does not apply route daily.  . Continuous Blood Gluc Sensor (FREESTYLE LIBRE SENSOR SYSTEM) MISC 1 Act by Does not apply route daily.  . Glucosamine-Chondroitin (COSAMIN DS PO) Take 1 tablet by mouth daily. 1500-1200  . Insulin Pen Needle (RELION PEN NEEDLES) 31G X 6 MM MISC Use as needed for insulin injection.  Marland Kitchen levothyroxine (SYNTHROID, LEVOTHROID) 75 MCG tablet Take 1 tablet (75 mcg total) by mouth daily.  .  propranolol (INDERAL) 20 MG tablet TAKE 1 TABLET(20 MG) BY MOUTH THREE TIMES DAILY  . Semaglutide, 1 MG/DOSE, (OZEMPIC, 1 MG/DOSE,) 2 MG/1.5ML SOPN Inject 1 mg into the skin once a week. (Patient taking differently: Inject 1 mg into the skin every Tuesday. )  . ST JOHNS WORT PO Take 1,400 mg by mouth daily.  . Testosterone 1.62 % GEL Place 6 Pump onto the skin daily.  Marland Kitchen triamterene-hydrochlorothiazide (MAXZIDE-25) 37.5-25 MG tablet TAKE 1 TABLET BY MOUTH EVERY DAY  . Vitamin D, Ergocalciferol, (DRISDOL) 1.25 MG (50000 UT) CAPS capsule TAKE 1 CAPSULE BY MOUTH ONCE A WEEK   No facility-administered encounter medications on file as of 01/05/2019.     Past Medical History:  Diagnosis Date  . Hypertension   . Obesity   . OSA on CPAP   . Type II diabetes mellitus with manifestations [facility])     Past Surgical History:  Procedure Laterality Date  . BIOPSY  08/31/2018   Procedure: BIOPSY;  Surgeon: Mauri Pole, MD;  Location: [facility];  Service: Endoscopy;;  . COLONOSCOPY WITH PROPOFOL N/A 11/12/2016   Procedure: COLONOSCOPY WITH PROPOFOL;  Surgeon: Mauri Pole, MD;  Location: [facility];  Service: Endoscopy;  Laterality: N/A;  . COLONOSCOPY WITH PROPOFOL N/A 08/31/2018   Procedure: COLONOSCOPY WITH PROPOFOL;  Surgeon: Mauri Pole, MD;  Location: [facility];  Service: Endoscopy;  Laterality: N/A;  . POLYPECTOMY  08/31/2018   Procedure: POLYPECTOMY;  Surgeon: Mauri Pole, MD;  Location: [facility];  Service: Endoscopy;;  . WISDOM TOOTH EXTRACTION      Social History   Socioeconomic History  . Marital status: Single    Spouse name: Not on file  . Number of children: Not on file  . Years of education: Not on file  . Highest education level: Not on file  Occupational History  . Occupation: unemployed  Social Needs  . Financial resource strain: Not on file  . Food insecurity:    Worry: Not on file    Inability: Not on file  . Transportation needs:     Medical: Not on file    Non-medical: Not on file  Tobacco Use  . Smoking status: Never Smoker  . Smokeless tobacco: Current User    Types: Chew  Substance and Sexual Activity  . Alcohol use: No    Alcohol/week: 0.0 standard drinks    Comment: hx of alcohol abuse - quit 2017  . Drug use: No  . Sexual activity: Not Currently  Lifestyle  . Physical activity:    Days per week: Not on file    Minutes per session: Not on file  . Stress: Not on file  Relationships  . Social connections:    Talks on phone: Not on file    Gets together: Not on file    Attends religious service: Not on file    Active member of club or organization: Not on file    Attends meetings of clubs or organizations: Not on file    Relationship status: Not on file  . Intimate partner violence:    Fear of current or ex partner: Not on file    Emotionally abused: Not on file    Physically abused: Not on file    Forced sexual activity: Not on file  Other Topics Concern  . Not on file  Social History Narrative  . Not on file    Family Status  Relation Name Status  . Father  Alive  . Mother  Alive  . Sister  Alive  . MGM  (Not Specified)  . Neg Hx  (Not Specified)    Review of Systems Review of Systems  Constitutional: Positive for malaise/fatigue.  HENT: Negative.   Eyes: Negative.   Respiratory: Negative.   Cardiovascular: Negative.   Gastrointestinal: Negative.   Genitourinary: Negative.   Skin: Negative.      Objective:   VITALS:   Vitals:   01/05/19 1040  BP: (!) 142/80  Pulse: 84  SpO2: 96%  Weight: (!) 399 lb (181 kg)  Height: 6\' 2"  (1.88 m)   GEN:  The patient appears stated age and is in NAD. HEENT:  Normocephalic, atraumatic.  The mucous membranes are moist. The superficial temporal arteries are without ropiness or tenderness. CV:  RRR Lungs:  CTAB Neck/HEME:  There are no carotid bruits bilaterally.  Neurological examination:  Orientation: The patient is alert and  oriented x3. Cranial nerves: There is good facial symmetry. The speech is fluent and clear. Soft palate rises symmetrically and there is no tongue deviation. Hearing is intact to conversational tone. Sensation: Sensation is intact to light touch throughout Motor: Strength is 5/5 in the bilateral upper and lower extremities.   Shoulder shrug is equal and symmetric.  There is no pronator drift.  Movement examination: Tone: There is normal tone in the upper and lower extremities. Abnormal movements: There  is no rest tremor.  There is mild to moderate intention and postural tremor.  He has trouble getting the hands on the page to draw Archimedes spirals, but once the hand is down, they are drawn fairly well.  The only time I saw head tremor was when I first walked into the room and he had mild head tremor in the "no" direction." Coordination:  There is no decremation with RAM's, with any form of RAMS, including alternating supination and pronation of the forearm, hand opening and closing, finger taps, heel taps and toe taps. Gait and Station: The patient ambulates well in the hall today.   Labs:   Chemistry      Component Value Date/Time   NA 138 12/28/2018 1104   K 4.2 12/28/2018 1104   CL 99 12/28/2018 1104   CO2 32 12/28/2018 1104   BUN 12 12/28/2018 1104   CREATININE 0.91 12/28/2018 1104      Component Value Date/Time   CALCIUM 9.7 12/28/2018 1104   ALKPHOS 70 12/28/2018 1104   AST 23 12/28/2018 1104   ALT 24 12/28/2018 1104   BILITOT 1.0 12/28/2018 1104     Lab Results  Component Value Date   TSH 2.47 07/27/2018        Assessment/Plan:   1.  Essential Tremor.  -This is evidenced by the symmetrical nature and longstanding hx of gradually getting worse.  He thought the primidone made him fatigued, although does have issues with chronic fatigue.  Propranolol has helped, and when he initially came in he wanted to go off of it to see if that was contributing to fatigue.  By the  end of the visit, he had asked me to increase the propranolol to see if that would help with the tremor more.  I did tell him I am not sure that it is contributing that much to fatigue, given that his pulse is really quite good.  He ultimately decided to increase the propranolol to 40 mg twice per day for the next 3 weeks.  If he does well, he can stay at that dosage.  If he continues to have tremor, but does not have side effects, he can increase to 40 mg 3 times per day.  I did offer him the long-acting form, Inderal LA, 120 mg, but he decided to hold off on that, primarily because of cost issues.  We discussed risk, benefits, and side effects.  He expressed understanding.  -Patient asked me about other medications.  We talked about second line medications, given that he has been through this first-line medications.  Specifically, we talked about trihexyphenidyl and its risks and benefits, and side effects.  He wanted to hold on that for now.  -He may be interested in DBS therapy, but we will explore medication first for now.  2.  Hx alcoholism  -sober x 2 years  3.  Follow-up in the next 4 to 5 months, sooner should new neurologic issues arise.  Much greater than 50% of this visit was spent in counseling and coordinating care.  Total face to face time:  25 min  CC:  [name], MD

## 2019-01-06 ENCOUNTER — Encounter: Payer: Self-pay | Admitting: Family

## 2019-01-06 ENCOUNTER — Ambulatory Visit: Payer: BLUE CROSS/BLUE SHIELD | Admitting: Family

## 2019-01-06 VITALS — BP 116/76 | HR 77 | Temp 97.6°F | Ht 74.0 in | Wt >= 6400 oz

## 2019-01-06 DIAGNOSIS — I10 Essential (primary) hypertension: Secondary | ICD-10-CM

## 2019-01-06 DIAGNOSIS — H60502 Unspecified acute noninfective otitis externa, left ear: Secondary | ICD-10-CM

## 2019-01-06 MED ORDER — NEOMYCIN-POLYMYXIN-HC 3.5-10000-1 OT SOLN: 3.0000 [drp] | Freq: Three times a day (TID) | OTIC | 0 refills | Status: DC

## 2019-01-06 MED ORDER — TRIAMTERENE-HCTZ 37.5-25 MG PO TABS: 1.0000 | ORAL_TABLET | Freq: Every day | ORAL | 0 refills | Status: DC

## 2019-01-06 NOTE — Progress Notes (Signed)
Zachary Murray is a 53 y.o. male with the following history as recorded in Epic[employer]:  Patient Active Problem List   Diagnosis Date Noted  . Special screening for malignant neoplasms, colon   . Family history of colon cancer   . Polyp of descending colon   . Polyp of transverse colon   . Tremor, essential 07/27/2018  . Chronic fatigue 02/11/2018  . Vitamin D deficiency 02/11/2018  . Hypogonadism male 02/11/2018  . GAD (generalized anxiety disorder) 10/22/2017  . Polyarthralgia 10/02/2017  . Meralgia paresthetica, left 10/02/2017  . Routine general medical examination at a health care facility 03/26/2017  . Severe episode of recurrent major depressive disorder, without psychotic features ([place]) 03/26/2017  . Lumbar radiculopathy 07/02/2016  . Type II diabetes mellitus with manifestations ([place]) 10/26/2015  . Hypothyroidism 12/08/2014  . RAD (reactive airway disease) with wheezing 11/24/2014  . Bilateral leg pain 04/28/2014  . Hyperlipidemia with target LDL less than 130 11/19/2013  . OSA (obstructive sleep apnea) 11/19/2013  . Severe obesity (BMI >= 40) ([place]) 09/15/2012  . Essential hypertension, benign 09/15/2012  . Family hx of colon cancer 09/15/2012    Current Outpatient Medications  Medication Sig Dispense Refill  . 5-Hydroxytryptophan (5-HTP PO) Take 2 tablets by mouth daily.    Marland Kitchen ALPRAZolam (XANAX) 0.5 MG tablet Take 1 tablet (0.5 mg total) by mouth 3 (three) times daily as needed. for anxiety 270 tablet 1  . cilostazol (PLETAL) 50 MG tablet Take 1 tablet (50 mg total) by mouth 2 (two) times daily. 180 tablet 1  . Continuous Blood Gluc Receiver (FREESTYLE LIBRE READER) DEVI 1 Act by Does not apply route daily. 1 Device 11  . Continuous Blood Gluc Sensor (FREESTYLE LIBRE SENSOR SYSTEM) MISC 1 Act by Does not apply route daily. 9 each 4  . Glucosamine-Chondroitin (COSAMIN DS PO) Take 1 tablet by mouth daily. 1500-1200    . Insulin Pen Needle (RELION PEN NEEDLES) 31G X 6 MM MISC  Use as needed for insulin injection. 100 each 3  . levothyroxine (SYNTHROID, LEVOTHROID) 75 MCG tablet Take 1 tablet (75 mcg total) by mouth daily. 90 tablet 3  . propranolol (INDERAL) 40 MG tablet Take 1 tablet (40 mg total) by mouth 3 (three) times daily. 270 tablet 1  . Semaglutide, 1 MG/DOSE, (OZEMPIC, 1 MG/DOSE,) 2 MG/1.5ML SOPN Inject 1 mg into the skin once a week. (Patient taking differently: Inject 1 mg into the skin every Tuesday. ) 12 pen 1  . ST JOHNS WORT PO Take 1,400 mg by mouth daily.    . Testosterone 1.62 % GEL Place 6 Pump onto the skin daily. 225 g 1  . triamterene-hydrochlorothiazide (MAXZIDE-25) 37.5-25 MG tablet Take 1 tablet by mouth daily. 90 tablet 0  . Vitamin D, Ergocalciferol, (DRISDOL) 1.25 MG (50000 UT) CAPS capsule TAKE 1 CAPSULE BY MOUTH ONCE A WEEK 12 capsule 1  . neomycin-polymyxin-hydrocortisone (CORTISPORIN) OTIC solution Place 3 drops into the left ear 3 (three) times daily. 10 mL 0   No current facility-administered medications for this visit.     Allergies: Metformin and related; Wellbutrin [bupropion]; and Lisinopril  Past Medical History:  Diagnosis Date  . Hypertension   . Obesity   . OSA on CPAP   . Type II diabetes mellitus with manifestations [facility])     Past Surgical History:  Procedure Laterality Date  . BIOPSY  08/31/2018   Procedure: BIOPSY;  Surgeon: Mauri Pole, MD;  [facility];  Service: Endoscopy;;  .  COLONOSCOPY WITH PROPOFOL N/A 11/12/2016   Procedure: COLONOSCOPY WITH PROPOFOL;  Surgeon: Mauri Pole, MD;  Location: [facility];  Service: Endoscopy;  Laterality: N/A;  . COLONOSCOPY WITH PROPOFOL N/A 08/31/2018   Procedure: COLONOSCOPY WITH PROPOFOL;  Surgeon: Mauri Pole, MD;  Location[facility];  Service: Endoscopy;  Laterality: N/A;  . POLYPECTOMY  08/31/2018   Procedure: POLYPECTOMY;  Surgeon: Mauri Pole, MD;  Location: [facility];  Service: Endoscopy;;  . WISDOM TOOTH EXTRACTION       Family History  Problem Relation Age of Onset  . Colon cancer Father   . Hypertension Father   . Thyroid cancer Mother   . Colon polyps Mother   . Thyroid disease Mother   . Thyroid disease Sister   . Thyroid disease Maternal Grandmother   . Alcohol abuse Neg Hx   . COPD Neg Hx   . Diabetes Neg Hx   . Early death Neg Hx   . Hearing loss Neg Hx   . Heart disease Neg Hx   . Hyperlipidemia Neg Hx   . Kidney disease Neg Hx   . Stroke Neg Hx     Social History   Tobacco Use  . Smoking status: Never Smoker  . Smokeless tobacco: Current User    Types: Chew  Substance Use Topics  . Alcohol use: No    Alcohol/week: 0.0 standard drinks    Comment: hx of alcohol abuse - quit 2017    Subjective:  Left ear pain x 3-4 days; seems to have improved with recent ear lavage; still feels irritated; no sinus pain or pressure; just feels full in canal; no hearing loss;    Objective:  Vitals:   01/06/19 1303  BP: 116/76  Pulse: 77  Temp: 97.6 F (36.4 C)  TempSrc: Oral  SpO2: 94%  Weight: (!) 400 lb 1.9 oz (181.5 kg)  Height: 6\' 2"  (1.88 m)    General: Well developed, well nourished, in no acute distress  Skin : Warm and dry.  Head: Normocephalic and atraumatic  Eyes: Sclera and conjunctiva clear; pupils round and reactive to light; extraocular movements intact  Ears: External normal; left canal erythematous; tympanic membranes normal  Oropharynx: Pink, supple. No suspicious lesions  Neck: Supple without thyromegaly, adenopathy  Lungs: Respirations unlabored;  Neurologic: Alert and oriented; speech intact; face symmetrical; moves all extremities well; CNII-XII intact without focal deficit  Assessment:  1. Acute otitis externa of left ear, unspecified type   2. Essential hypertension, benign     Plan:  1. Cortisporin Otic suspension- use as directed; follow-up worse, no better. 2. Stable; refill on Triam/ HCTZ as requested.   No follow-ups on file.  No orders of the  defined types were placed in this encounter.   Requested Prescriptions   Signed Prescriptions Disp Refills  . neomycin-polymyxin-hydrocortisone (CORTISPORIN) OTIC solution 10 mL 0    Sig: Place 3 drops into the left ear 3 (three) times daily.  Marland Kitchen triamterene-hydrochlorothiazide (MAXZIDE-25) 37.5-25 MG tablet 90 tablet 0    Sig: Take 1 tablet by mouth daily.

## 2019-01-07 ENCOUNTER — Ambulatory Visit: Payer: BLUE CROSS/BLUE SHIELD | Admitting: Neurology

## 2019-01-25 ENCOUNTER — Other Ambulatory Visit: Payer: Self-pay | Admitting: Endocrinology

## 2019-01-25 NOTE — Telephone Encounter (Signed)
Please refill if appropriate

## 2019-01-25 NOTE — Telephone Encounter (Signed)
Has this been done?

## 2019-02-20 ENCOUNTER — Other Ambulatory Visit: Payer: Self-pay | Admitting: Internal Medicine

## 2019-02-20 DIAGNOSIS — I739 Peripheral vascular disease, unspecified: Secondary | ICD-10-CM

## 2019-02-23 ENCOUNTER — Other Ambulatory Visit: Payer: Self-pay | Admitting: Internal Medicine

## 2019-02-23 DIAGNOSIS — F411 Generalized anxiety disorder: Secondary | ICD-10-CM

## 2019-03-17 ENCOUNTER — Other Ambulatory Visit: Payer: Self-pay | Admitting: Internal Medicine

## 2019-03-17 DIAGNOSIS — I10 Essential (primary) hypertension: Secondary | ICD-10-CM

## 2019-03-17 MED ORDER — TRIAMTERENE-HCTZ 37.5-25 MG PO TABS: 1.0000 | ORAL_TABLET | Freq: Every day | ORAL | 0 refills | Status: DC

## 2019-03-29 ENCOUNTER — Telehealth: Payer: Self-pay

## 2019-03-29 ENCOUNTER — Other Ambulatory Visit (INDEPENDENT_AMBULATORY_CARE_PROVIDER_SITE_OTHER): Payer: BLUE CROSS/BLUE SHIELD

## 2019-03-29 ENCOUNTER — Other Ambulatory Visit: Payer: Self-pay

## 2019-03-29 DIAGNOSIS — E039 Hypothyroidism, unspecified: Secondary | ICD-10-CM | POA: Diagnosis not present

## 2019-03-29 DIAGNOSIS — E1169 Type 2 diabetes mellitus with other specified complication: Secondary | ICD-10-CM | POA: Diagnosis not present

## 2019-03-29 DIAGNOSIS — E669 Obesity, unspecified: Secondary | ICD-10-CM | POA: Diagnosis not present

## 2019-03-29 DIAGNOSIS — E291 Testicular hypofunction: Secondary | ICD-10-CM | POA: Diagnosis not present

## 2019-03-29 LAB — COMPREHENSIVE METABOLIC PANEL
ALT: 26 U/L (ref 0–53)
AST: 26 U/L (ref 0–37)
Albumin: 4.5 g/dL (ref 3.5–5.2)
Alkaline Phosphatase: 69 U/L (ref 39–117)
BUN: 13 mg/dL (ref 6–23)
CO2: 31 mEq/L (ref 19–32)
Calcium: 10 mg/dL (ref 8.4–10.5)
Chloride: 99 mEq/L (ref 96–112)
Creatinine, Ser: 0.97 mg/dL (ref 0.40–1.50)
GFR: 80.26 mL/min (ref >60.00)
Glucose, Bld: 136 mg/dL — ABNORMAL HIGH (ref 70–99)
Potassium: 4.5 mEq/L (ref 3.5–5.1)
Sodium: 138 mEq/L (ref 135–145)
Total Bilirubin: 1.2 mg/dL (ref 0.2–1.2)
Total Protein: 7.7 g/dL (ref 6.0–8.3)

## 2019-03-29 LAB — MICROALBUMIN / CREATININE URINE RATIO
Creatinine,U: 174.7 mg/dL
Microalb Creat Ratio: 2.1 mg/g (ref 0.0–30.0)
Microalb, Ur: 3.6 mg/dL — ABNORMAL HIGH (ref 0.0–1.9)

## 2019-03-29 LAB — CBC
HCT: 52.8 % — ABNORMAL HIGH (ref 39.0–52.0)
Hemoglobin: 18.3 g/dL (ref 13.0–17.0)
MCHC: 34.6 g/dL (ref 30.0–36.0)
MCV: 88.3 fl (ref 78.0–100.0)
Platelets: 247 10*3/uL (ref 150.0–400.0)
RBC: 5.99 Mil/uL — ABNORMAL HIGH (ref 4.22–5.81)
RDW: 13.9 % (ref 11.5–15.5)
WBC: 8 10*3/uL (ref 4.0–10.5)

## 2019-03-29 LAB — HEMOGLOBIN A1C: Hgb A1c MFr Bld: 6.9 % — ABNORMAL HIGH (ref 4.6–6.5)

## 2019-03-29 LAB — T4, FREE: Free T4: 1.07 ng/dL (ref 0.60–1.60)

## 2019-03-29 LAB — TESTOSTERONE: Testosterone: 372.34 ng/dL (ref 300.00–890.00)

## 2019-03-29 LAB — TSH: TSH: 2.28 u[IU]/mL (ref 0.35–4.50)

## 2019-03-31 ENCOUNTER — Other Ambulatory Visit: Payer: Self-pay

## 2019-03-31 NOTE — Progress Notes (Signed)
Patient ID: Zachary Murray, male   DOB: [birth year], 53 y.o.   MRN: [id number]           Today's office visit was provided via telemedicine using video technique Explained to the patient and the the limitations of evaluation and management by telemedicine and the availability of in person appointments.  The patient understood the limitations and agreed to proceed. Patient also understood that the telehealth visit is billable. . Location of the patient: Home . Location of the provider: Office Only the patient and myself were participating in the encounter     Chief complaint: Endocrinology follow-up  History of Present Illness  PROBLEM 1  Hypogonadismwas first diagnosed in 11/18  Since about 2016 or so he has had complaints offatigue He also feels malaise and listless, mostly in the afternoons and did not feel like doing anything at that time Also has some decreased libido  The diagnosis was confirmed by the low free T level of 6.6 done in 5/19 [facility] was inappropriately low at 6.3  He was on a trial of CLOMIPHENE 25 mg 3 times a week With this his energy level was improving but only partially and his libido was slightly better too This is despite his testosterone level being as high as 661, his [place] had gone up to 20  Because of the lack of adequate symptomatic improvement he is taking AndroGel since 9/19 Appears to be requiring relatively large doses of AndroGel up to 6 pumps a day He does have some concern about the large volume of application of the gel  With this he is feeling significantly better with his energy level although at times will feel fatigued and sleepy No recent change and he does not feel tired  Since his testosterone level was 680 previously he is now taking 5 pumps a day instead of 6 His testosterone level is now 372  However his hemoglobin is up to 18.3 compared to 16.9  Lab Results  Component Value Date   TESTOSTERONE 372.34 03/29/2019   TESTOSTERONE  679.47 12/28/2018   TESTOSTERONE 219.89 (L) 10/29/2018   TESTOSTERONE 340.94 09/23/2018    Prolactin level: 5.8  Lab Results  Component Value Date   LH 20.04 (H) 05/20/2018   Lab Results  Component Value Date   HGB 18.3 Repeated and verified X2. (Zachary Murray) 03/29/2019     PROBLEM 2: DIABETES:  Diagnosis date: ?  2016  Previous history:  Although he was diagnosed to have diabetes in 2016 he was not able to tolerate metformin that was previously prescribed Also was tried on [place] in 2017 and 2018 with some improved control He was tried on Bydureon in 06/2017 and continued until mil 3/19 d He was started on Ozempic and Tresiba insulin in 01/2018 by his PCP when his A1c was 7.8 and fasting glucose 165 Insulin was stopped in 5/19 since his blood sugar was averaging only 92 on his freestyle sensor at home  Recent history:     Non-insulin hypoglycemic drugs:   Ozempic 1 mg weekly  His most recent A1c 6.9 compared to 6. 6 %   Current management, blood sugar patterns and problems identified:   He was trying to use the freestyle libre sensor previously but found this too uncomfortable to keep on and is not checking his blood sugars  With staying at home more he is less active and he thinks he may not be watching his diet with portions or snacks as well  Not clear if  he has gained weight  Fasting lab glucose was 136 but he thinks that in the past with the freestyle libre his fasting blood sugars would be low below 100  He has no side effects with Ozempic       Monitors blood glucose:   Previously with freestyle libre         Blood Glucose readings:  Currently not available  Side effects from medications:  Diarrhea from metformin         Meals:  Usually low-fat        Physical activity: exercise: Very little walking             Dietician visit: Most recent:     Never Weight control:  Wt Readings from Last 3 Encounters:  01/06/19 (!) 400 lb 1.9 oz (181.5 kg)  01/05/19  (!) 399 lb (181 kg)  12/30/18 (!) 399 lb 9.6 oz (181.3 kg)   Lab Results  Component Value Date   HGBA1C 6.9 (H) 03/29/2019   HGBA1C 6.6 (H) 12/28/2018   HGBA1C 6.5 07/27/2018   Lab Results  Component Value Date   MICROALBUR 3.6 (H) 03/29/2019   LDLCALC 78 12/28/2018   CREATININE 0.97 03/29/2019    HYPOTHYROIDISM:  He was previously given levothyroxine for high TSH in 12/16 but later stopped because of improved TSH  Because of his fatigue he had been started on levothyroxine in 3/19 with only slight improvement in his fatigue He has had fatigue related to other concomitant problems  He has been taking 75 mcg of levothyroxine, this was increased by Dr. Tamala Julian when his TSH was upper normal in July 2019 TSH continues to be consistently normal     Lab Results  Component Value Date   TSH 2.28 03/29/2019   TSH 2.47 07/27/2018   TSH 4.24 05/20/2018   FREET4 1.07 03/29/2019   FREET4 0.82 07/21/2018   FREET4 0.92 03/30/2018        Allergies as of 04/01/2019      Reactions   Metformin And Related Diarrhea   Wellbutrin [bupropion] Other (See Comments)   tremors   Lisinopril Cough      Medication List       Accurate as of Mar 31, 2019  8:58 PM. If you have any questions, ask your nurse or doctor.        5-HTP PO Take 2 tablets by mouth daily.   ALPRAZolam 0.5 MG tablet Commonly known as:  XANAX TAKE 1 TABLET(0.5 MG) BY MOUTH THREE TIMES DAILY AS NEEDED FOR ANXIETY   cilostazol 50 MG tablet Commonly known as:  PLETAL TAKE 1 TABLET(50 MG) BY MOUTH TWICE DAILY   COSAMIN DS PO Take 1 tablet by mouth daily. 1500-1200   FreeStyle Libre Reader Devi 1 Act by Does not apply route daily.   FreeStyle [employer] Misc 1 Act by Does not apply route daily.   Insulin Pen Needle 31G X 6 MM Misc Commonly known as:  ReliOn Pen Needles Use as needed for insulin injection.   levothyroxine 75 MCG tablet Commonly known as:  SYNTHROID Take 1 tablet (75 mcg  total) by mouth daily.   propranolol 40 MG tablet Commonly known as:  INDERAL Take 1 tablet (40 mg total) by mouth 3 (three) times daily.   Semaglutide (1 MG/DOSE) 2 MG/1.5ML Sopn Commonly known as:  Ozempic (1 MG/DOSE) Inject 1 mg into the skin once a week. What changed:  when to take this   Zachary Murray  Take 1,400 mg by mouth daily.   Testosterone 20.25 MG/ACT (1.62%) Gel Apply 3 pumps on 1 shoulder and 2 pumps on the other every morning   triamterene-hydrochlorothiazide 37.5-25 MG tablet Commonly known as:  MAXZIDE-25 Take 1 tablet by mouth daily.   Vitamin D (Ergocalciferol) 1.25 MG (50000 UT) Caps capsule Commonly known as:  DRISDOL TAKE 1 CAPSULE BY MOUTH ONCE A WEEK       Allergies:  Allergies  Allergen Reactions  . Metformin And Related Diarrhea  . Wellbutrin [Bupropion] Other (See Comments)    tremors  . Lisinopril Cough    Past Medical History:  Diagnosis Date  . Hypertension   . Obesity   . OSA on CPAP   . Type II diabetes mellitus with manifestations [facility])     Past Surgical History:  Procedure Laterality Date  . BIOPSY  08/31/2018   Procedure: BIOPSY;  Surgeon: Mauri Pole, MD;  Location: [facility] ENDOSCOPY;  Service: Endoscopy;;  . COLONOSCOPY WITH PROPOFOL N/A 11/12/2016   Procedure: COLONOSCOPY WITH PROPOFOL;  Surgeon: Mauri Pole, MD;  Location: [facility] ENDOSCOPY;  Service: Endoscopy;  Laterality: N/A;  . COLONOSCOPY WITH PROPOFOL N/A 08/31/2018   Procedure: COLONOSCOPY WITH PROPOFOL;  Surgeon: Mauri Pole, MD;  Location: [facility];  Service: Endoscopy;  Laterality: N/A;  . POLYPECTOMY  08/31/2018   Procedure: POLYPECTOMY;  Surgeon: Mauri Pole, MD;  Location: [facility] ENDOSCOPY;  Service: Endoscopy;;  . WISDOM TOOTH EXTRACTION      Family History  Problem Relation Age of Onset  . Colon cancer Father   . Hypertension Father   . Thyroid cancer Mother   . Colon polyps Mother   . Thyroid disease Mother   . Thyroid  disease Sister   . Thyroid disease Maternal Grandmother   . Alcohol abuse Neg Hx   . COPD Neg Hx   . Diabetes Neg Hx   . Early death Neg Hx   . Hearing loss Neg Hx   . Heart disease Neg Hx   . Hyperlipidemia Neg Hx   . Kidney disease Neg Hx   . Stroke Neg Hx     Social History:  reports that he has never smoked. His smokeless tobacco use includes chew. He reports that he does not drink alcohol or use drugs.  Review of Systems    LIPIDS: LDL below 100, not on a statin drug Followed by PCP  Lab Results  Component Value Date   CHOL 127 12/28/2018   HDL 38.00 (L) 12/28/2018   LDLCALC 78 12/28/2018   TRIG 55.0 12/28/2018   CHOLHDL 3 12/28/2018     General Examination:   There were no vitals taken for this visit.    Assessment/ Plan:   HYPOGONADOTROPIC HYPOGONADISM with symptoms of fatigue and low free testosterone at baseline  He has been symptomatic with fatigue and decreased libido  He is now taking 5 pumps daily of the AndroGel He is applying this regularly and his level is just below 400 He feels fairly good with his energy level However his hemoglobin is now significantly above normal compared to his previous levels  Although he is reluctant to reduce his dose discussed that he did not desirable to have his hemoglobin at 18.3 Rather than think about phlebotomy he will need to reduce the dose to 2 pumps on each arm Also discussed that other preparations of testosterone will not have any different effects   DIABETES with morbid obesity  Blood sugars are not controlled  as well with A1c 6.9 and fasting glucose 136 He is not able to do any regular exercise and also with staying more at home he has not watched his diet as well  He agrees to start walking as much as possible, can start with a small duration every day Needs to watch his diet with portions and carbohydrates He thinks he can also start checking his blood sugars with a regular meter and discussed  blood sugar targets at various times  HYPOTHYROIDISM: This has been mild and on supplement with levothyroxine since 2019 Doing well subjectively and TSH is consistently in the normal range with 75 mcg daily  HYPERTENSION followed by PCP and he needs to be also monitoring at home  Total visit time for evaluation and management of multiple problems and counseling =25 minutes  Elayne Snare 03/31/2019, 8:58 PM     Note: This office note was prepared with Dragon voice recognition system technology. Any transcriptional errors that result from this process are unintentional.     Elayne Snare 03/31/2019, 8:58 PM

## 2019-04-01 ENCOUNTER — Other Ambulatory Visit: Payer: Self-pay

## 2019-04-01 ENCOUNTER — Ambulatory Visit (INDEPENDENT_AMBULATORY_CARE_PROVIDER_SITE_OTHER): Payer: BLUE CROSS/BLUE SHIELD | Admitting: Endocrinology

## 2019-04-01 ENCOUNTER — Encounter: Payer: Self-pay | Admitting: Endocrinology

## 2019-04-01 DIAGNOSIS — E039 Hypothyroidism, unspecified: Secondary | ICD-10-CM

## 2019-04-01 DIAGNOSIS — E669 Obesity, unspecified: Secondary | ICD-10-CM | POA: Diagnosis not present

## 2019-04-01 DIAGNOSIS — E291 Testicular hypofunction: Secondary | ICD-10-CM | POA: Diagnosis not present

## 2019-04-01 DIAGNOSIS — E1169 Type 2 diabetes mellitus with other specified complication: Secondary | ICD-10-CM

## 2019-04-01 MED ORDER — GLUCOSE BLOOD VI STRP: ORAL_STRIP | 3 refills | Status: DC

## 2019-04-01 MED ORDER — ONETOUCH VERIO FLEX SYSTEM W/DEVICE KIT: PACK | 0 refills | Status: AC

## 2019-04-01 MED ORDER — ONETOUCH ULTRASOFT LANCETS MISC: 3 refills | Status: DC

## 2019-04-13 ENCOUNTER — Ambulatory Visit (INDEPENDENT_AMBULATORY_CARE_PROVIDER_SITE_OTHER): Payer: BLUE CROSS/BLUE SHIELD | Admitting: Internal Medicine

## 2019-04-13 ENCOUNTER — Encounter: Payer: Self-pay | Admitting: Internal Medicine

## 2019-04-13 ENCOUNTER — Other Ambulatory Visit: Payer: BLUE CROSS/BLUE SHIELD

## 2019-04-13 DIAGNOSIS — D2330 Other benign neoplasm of skin of unspecified part of face: Secondary | ICD-10-CM | POA: Insufficient documentation

## 2019-04-13 DIAGNOSIS — F411 Generalized anxiety disorder: Secondary | ICD-10-CM

## 2019-04-13 DIAGNOSIS — A09 Infectious gastroenteritis and colitis, unspecified: Secondary | ICD-10-CM | POA: Diagnosis not present

## 2019-04-13 DIAGNOSIS — Z1159 Encounter for screening for other viral diseases: Secondary | ICD-10-CM | POA: Diagnosis not present

## 2019-04-13 DIAGNOSIS — Z0000 Encounter for general adult medical examination without abnormal findings: Secondary | ICD-10-CM | POA: Diagnosis not present

## 2019-04-13 DIAGNOSIS — I10 Essential (primary) hypertension: Secondary | ICD-10-CM

## 2019-04-13 DIAGNOSIS — K580 Irritable bowel syndrome with diarrhea: Secondary | ICD-10-CM | POA: Insufficient documentation

## 2019-04-13 DIAGNOSIS — I739 Peripheral vascular disease, unspecified: Secondary | ICD-10-CM

## 2019-04-13 MED ORDER — TRIAMTERENE-HCTZ 37.5-25 MG PO TABS: 1.0000 | ORAL_TABLET | Freq: Every day | ORAL | 0 refills | Status: DC

## 2019-04-13 MED ORDER — CILOSTAZOL 50 MG PO TABS: ORAL_TABLET | ORAL | 1 refills | Status: DC

## 2019-04-13 MED ORDER — RIFAXIMIN 550 MG PO TABS: 550.0000 mg | ORAL_TABLET | Freq: Three times a day (TID) | ORAL | 0 refills | Status: AC

## 2019-04-13 MED ORDER — ALPRAZOLAM 0.5 MG PO TABS: ORAL_TABLET | ORAL | 0 refills | Status: DC

## 2019-04-13 NOTE — Progress Notes (Signed)
Virtual Visit via Video Note  I connected with Zachary Murray on 04/13/19 at  9:30 AM EDT by a video enabled telemedicine application and verified that I am speaking with the correct person using two identifiers.   I discussed the limitations of evaluation and management by telemedicine and the availability of in person appointments. The patient expressed understanding and agreed to proceed.  History of Present Illness: He checked in for a virtual visit.  He was not willing to come in for an in person visit due to the COVID-19 pandemic.  He complains of a several month history of diarrhea that has worsened over the last few weeks.  He describes several loose, watery, gassy stools throughout the day and night with occasional abdominal cramping.  He has not seen blood in his stool and he denies nausea, vomiting, fever, chills, rash, loss of appetite, or weight loss.  He has gotten some symptom relief with Imodium.  He continues to complain of a cystic lesion on the right side of his face.  He tried to have it removed by a general surgeon but was unsuccessful.  He now wants to see a plastic surgeon to have it removed without much scarring.  He also requested refills on maintenance meds.    Observations/Objective: Obese male.  Obvious cystic lesion on the right malar surface.  No distress.  Calm, cooperative, and appropriate.  He appeared to have normal color and normal hydration.   Lab Results  Component Value Date   WBC 8.0 03/29/2019   HGB 18.3 Repeated and verified X2. (HH) 03/29/2019   HCT 52.8 (H) 03/29/2019   PLT 247.0 03/29/2019   GLUCOSE 136 (H) 03/29/2019   CHOL 127 12/28/2018   TRIG 55.0 12/28/2018   HDL 38.00 (L) 12/28/2018   LDLCALC 78 12/28/2018   ALT 26 03/29/2019   AST 26 03/29/2019   NA 138 03/29/2019   K 4.5 03/29/2019   CL 99 03/29/2019   CREATININE 0.97 03/29/2019   BUN 13 03/29/2019   CO2 31 03/29/2019   TSH 2.28 03/29/2019   PSA 0.47 03/26/2017   HGBA1C 6.9 (H)  03/29/2019   MICROALBUR 3.6 (H) 03/29/2019    Assessment and Plan: I am concerned he has either IBS-C or small intestinal bacterial overgrowth syndrome.  I have asked him to take a 14-day course of Xifaxan.  I will also screen him for GI pathogens and C. difficile.  I have referred him to a plastic surgeon for evaluation of the cystic lesion on the right malar surface.   Follow Up Instructions: He will avoid dairy products.  He will continue taking Imodium as needed.  He will let me know if he develops any new or worsening symptoms.  He agrees to submit a stool specimen.    I discussed the assessment and treatment plan with the patient. The patient was provided an opportunity to ask questions and all were answered. The patient agreed with the plan and demonstrated an understanding of the instructions.   The patient was advised to call back or seek an in-person evaluation if the symptoms worsen or if the condition fails to improve as anticipated.  I provided 25   Subjective:  Patient ID: Zachary Murray, male    DOB: 12-04-1965  Age: 53 y.o. MRN: [id number]  CC: Diarrhea   [name] presents for diarrhea  Outpatient Medications Prior to Visit  Medication Sig Dispense Refill  . 5-Hydroxytryptophan (5-HTP PO) Take 2 tablets by mouth  daily.    . Blood Glucose Monitoring Suppl ([place]) w/Device KIT Use as instructed to check blood sugar once daily. 1 kit 0  . Continuous Blood Gluc Receiver (FREESTYLE LIBRE READER) DEVI 1 Act by Does not apply route daily. 1 Device 11  . Continuous Blood Gluc Sensor (FREESTYLE LIBRE SENSOR SYSTEM) MISC 1 Act by Does not apply route daily. 9 each 4  . glucose blood test strip Use onetouch verio flex test strip as instructed to check blood sugar once daily. 100 each 3  . Insulin Pen Needle (RELION PEN NEEDLES) 31G X 6 MM MISC Use as needed for insulin injection. 100 each 3  . Lancets (ONETOUCH ULTRASOFT) lancets Use onetouch delica  lancet to check blood sugar once daily. 100 each 3  . levothyroxine (SYNTHROID, LEVOTHROID) 75 MCG tablet Take 1 tablet (75 mcg total) by mouth daily. 90 tablet 3  . propranolol (INDERAL) 40 MG tablet Take 1 tablet (40 mg total) by mouth 3 (three) times daily. 270 tablet 1  . Semaglutide, 1 MG/DOSE, (OZEMPIC, 1 MG/DOSE,) 2 MG/1.5ML SOPN Inject 1 mg into the skin once a week. (Patient taking differently: Inject 1 mg into the skin every Tuesday. ) 12 pen 1  . Testosterone 20.25 MG/ACT (1.62%) GEL Apply 3 pumps on 1 shoulder and 2 pumps on the other every morning 225 g 2  . Vitamin D, Ergocalciferol, (DRISDOL) 1.25 MG (50000 UT) CAPS capsule TAKE 1 CAPSULE BY MOUTH ONCE A WEEK 12 capsule 1  . ALPRAZolam (XANAX) 0.5 MG tablet TAKE 1 TABLET(0.5 MG) BY MOUTH THREE TIMES DAILY AS NEEDED FOR ANXIETY 270 tablet 0  . cilostazol (PLETAL) 50 MG tablet TAKE 1 TABLET(50 MG) BY MOUTH TWICE DAILY 180 tablet 1  . Glucosamine-Chondroitin (COSAMIN DS PO) Take 1 tablet by mouth daily. 1500-1200    . [place] JOHNS WORT PO Take 1,400 mg by mouth daily.    Marland Kitchen triamterene-hydrochlorothiazide (MAXZIDE-25) 37.5-25 MG tablet Take 1 tablet by mouth daily. 30 tablet 0   No facility-administered medications prior to visit.     ROS Review of Systems  Objective:  There were no vitals taken for this visit.  BP Readings from Last 3 Encounters:  01/06/19 116/76  01/05/19 (!) 142/80  12/30/18 110/72    Wt Readings from Last 3 Encounters:  01/06/19 (!) 400 lb 1.9 oz (181.5 kg)  01/05/19 (!) 399 lb (181 kg)  12/30/18 (!) 399 lb 9.6 oz (181.3 kg)    Physical Exam  Lab Results  Component Value Date   WBC 8.0 03/29/2019   HGB 18.3 Repeated and verified X2. (HH) 03/29/2019   HCT 52.8 (H) 03/29/2019   PLT 247.0 03/29/2019   GLUCOSE 136 (H) 03/29/2019   CHOL 127 12/28/2018   TRIG 55.0 12/28/2018   HDL 38.00 (L) 12/28/2018   LDLCALC 78 12/28/2018   ALT 26 03/29/2019   AST 26 03/29/2019   NA 138 03/29/2019   K 4.5  03/29/2019   CL 99 03/29/2019   CREATININE 0.97 03/29/2019   BUN 13 03/29/2019   CO2 31 03/29/2019   TSH 2.28 03/29/2019   PSA 0.47 03/26/2017   HGBA1C 6.9 (H) 03/29/2019   MICROALBUR 3.6 (H) 03/29/2019    No results found.  Assessment & Plan:   Zachary Murray was seen today for diarrhea.  Diagnoses and all orders for this visit:  GAD (generalized anxiety disorder) -     ALPRAZolam (XANAX) 0.5 MG tablet; TAKE 1 TABLET(0.5 MG) BY MOUTH THREE TIMES  DAILY AS NEEDED FOR ANXIETY  Need for hepatitis C screening test -     Hepatitis C antibody; Future  Routine general medical examination at a health care facility -     HIV Antibody (routine testing w rflx); Future  Diarrhea of infectious origin -     Clostridium difficile EIA; Future -     Giardia/cryptosporidium (EIA); Future -     Fecal lactoferrin, quant; Future -     Ova and parasite examination; Future  Irritable bowel syndrome with diarrhea -     rifaximin (XIFAXAN) 550 MG TABS tablet; Take 1 tablet (550 mg total) by mouth 3 (three) times daily for 14 days.  Essential hypertension, benign -     triamterene-hydrochlorothiazide (MAXZIDE-25) 37.5-25 MG tablet; Take 1 tablet by mouth daily.  PAD (peripheral artery disease) (HCC) -     cilostazol (PLETAL) 50 MG tablet; TAKE 1 TABLET(50 MG) BY MOUTH TWICE DAILY  Cyst, dermoid, face -     Ambulatory referral to Plastic Surgery   I have discontinued Zachary Murray. [place]ag[place] "Zachary Murray"'s Glucosamine-Chondroitin (COSAMIN DS PO) and [place] J[place]NS WORT PO. I am also having him start on rifaximin. Additionally, I am having him maintain his FreeStyle Libre Reader, Insulin Pen Needle, levothyroxine, Semaglutide (1 MG/DOSE), FreeStyle [employer], Vitamin D (Ergocalciferol), 5-Hydroxytryptophan (5-HTP PO), propranolol, Testosterone, onetouch ultrasoft, glucose blood, OneTouch Verio Flex System, triamterene-hydrochlorothiazide, cilostazol, and ALPRAZolam.  Meds ordered this encounter  Medications  .  rifaximin (XIFAXAN) 550 MG TABS tablet    Sig: Take 1 tablet (550 mg total) by mouth 3 (three) times daily for 14 days.    Dispense:  42 tablet    Refill:  0  . triamterene-hydrochlorothiazide (MAXZIDE-25) 37.5-25 MG tablet    Sig: Take 1 tablet by mouth daily.    Dispense:  90 tablet    Refill:  0  . cilostazol (PLETAL) 50 MG tablet    Sig: TAKE 1 TABLET(50 MG) BY MOUTH TWICE DAILY    Dispense:  180 tablet    Refill:  1  . ALPRAZolam (XANAX) 0.5 MG tablet    Sig: TAKE 1 TABLET(0.5 MG) BY MOUTH THREE TIMES DAILY AS NEEDED FOR ANXIETY    Dispense:  270 tablet    Refill:  0     Follow-up: No follow-ups on file.  Scarlette Calico, MD minutes of non-face-to-face time during this encounter.   Scarlette Calico, MD

## 2019-04-14 LAB — C. DIFFICILE GDH AND TOXIN A/B
GDH ANTIGEN: NOT DETECTED
MICRO NUMBER:: 505417
SPECIMEN QUALITY:: ADEQUATE
TOXIN A AND B: NOT DETECTED

## 2019-04-14 LAB — HEPATITIS C ANTIBODY
Hepatitis C Ab: NONREACTIVE
SIGNAL TO CUT-OFF: 0.01 (ref <1.00)

## 2019-04-14 LAB — HIV ANTIBODY (ROUTINE TESTING W REFLEX): HIV 1&2 Ab, 4th Generation: NONREACTIVE

## 2019-04-20 ENCOUNTER — Encounter: Payer: Self-pay | Admitting: Internal Medicine

## 2019-04-20 LAB — GIARDIA/CRYPTOSPORIDIUM (EIA)
MICRO NUMBER:: 505208
MICRO NUMBER:: 505209
RESULT:: NOT DETECTED
RESULT:: NOT DETECTED
SPECIMEN QUALITY:: ADEQUATE
SPECIMEN QUALITY:: ADEQUATE

## 2019-04-20 LAB — OVA AND PARASITE EXAMINATION
CONCENTRATE RESULT:: NONE SEEN
MICRO NUMBER:: 505210
SPECIMEN QUALITY:: ADEQUATE
TRICHROME RESULT:: NONE SEEN

## 2019-04-20 LAB — FECAL LACTOFERRIN, QUANT
Fecal Lactoferrin: NEGATIVE
MICRO NUMBER:: 509751
SPECIMEN QUALITY:: ADEQUATE

## 2019-04-26 ENCOUNTER — Other Ambulatory Visit: Payer: Self-pay | Admitting: Internal Medicine

## 2019-04-26 ENCOUNTER — Encounter: Payer: Self-pay | Admitting: Internal Medicine

## 2019-04-26 DIAGNOSIS — R197 Diarrhea, unspecified: Secondary | ICD-10-CM

## 2019-04-27 ENCOUNTER — Other Ambulatory Visit: Payer: BLUE CROSS/BLUE SHIELD

## 2019-04-27 DIAGNOSIS — R197 Diarrhea, unspecified: Secondary | ICD-10-CM | POA: Diagnosis not present

## 2019-04-28 ENCOUNTER — Other Ambulatory Visit: Payer: BC Managed Care – PPO

## 2019-04-28 DIAGNOSIS — R197 Diarrhea, unspecified: Secondary | ICD-10-CM

## 2019-05-03 LAB — RETICULIN ANTIBODIES, IGA W TITER: Reticulin IGA Screen: NEGATIVE

## 2019-05-03 LAB — GLIADIN ANTIBODIES, SERUM
Gliadin IgA: 6 Units
Gliadin IgG: 2 Units

## 2019-05-03 LAB — TISSUE TRANSGLUTAMINASE, IGA: (tTG) Ab, IgA: 1 U/mL

## 2019-05-05 ENCOUNTER — Encounter: Payer: Self-pay | Admitting: Internal Medicine

## 2019-05-05 LAB — GIARDIA/CRYPTOSPORIDIUM (EIA)
MICRO NUMBER:: 555800
MICRO NUMBER:: 555801
RESULT:: NOT DETECTED
RESULT:: NOT DETECTED
SPECIMEN QUALITY:: ADEQUATE
SPECIMEN QUALITY:: ADEQUATE

## 2019-05-05 LAB — OVA AND PARASITE EXAMINATION
CONCENTRATE RESULT:: NONE SEEN
MICRO NUMBER:: 555802
SPECIMEN QUALITY:: ADEQUATE
TRICHROME RESULT:: NONE SEEN

## 2019-05-05 LAB — CALPROTECTIN: Calprotectin: 36 mcg/g

## 2019-05-05 LAB — PANCREATIC ELASTASE, FECAL: Pancreatic Elastase-1, Stool: >500 mcg/g

## 2019-05-05 NOTE — Progress Notes (Signed)
[name] [place], [place] [address] Phone: [phone] Subjective:   [name], am serving as a scribe for Dr. Hulan Saas.   CC: Leg pain follow-up  XFG:[id number]  Zachary Murray is a 53 y.o. male coming in with complaint of leg pain after sitting. Notes having a claudication issue. Pain in his legs is from thighs down to the calf. Has trouble standing for prolonged periods as well walking in larger stores. Has had increasing pain recently. Is using Pletal. Has been using Vitamin D once weekly.  Patient states that starting to worsen again.  Had to stand at a funeral that seemed to exacerbate initially.  Increase activity.     Past Medical History:  Diagnosis Date  . Hypertension   . Obesity   . OSA on CPAP   . Type II diabetes mellitus with manifestations [facility])    Past Surgical History:  Procedure Laterality Date  . BIOPSY  08/31/2018   Procedure: BIOPSY;  Surgeon: Mauri Pole, MD;  Location: [facility];  Service: Endoscopy;;  . COLONOSCOPY WITH PROPOFOL N/A 11/12/2016   Procedure: COLONOSCOPY WITH PROPOFOL;  Surgeon: Mauri Pole, MD;  Location: [facility];  Service: Endoscopy;  Laterality: N/A;  . COLONOSCOPY WITH PROPOFOL N/A 08/31/2018   Procedure: COLONOSCOPY WITH PROPOFOL;  Surgeon: Mauri Pole, MD;  Location: [facility];  Service: Endoscopy;  Laterality: N/A;  . POLYPECTOMY  08/31/2018   Procedure: POLYPECTOMY;  Surgeon: Mauri Pole, MD;  Location: [facility];  Service: Endoscopy;;  . WISDOM TOOTH EXTRACTION     Social History   Socioeconomic History  . Marital status: Single    Spouse name: Not on file  . Number of children: Not on file  . Years of education: Not on file  . Highest education level: Not on file  Occupational History  . Occupation: unemployed  Social Needs  . Financial resource strain: Not on file  . Food insecurity    Worry: Not on file    Inability:  Not on file  . Transportation needs    Medical: Not on file    Non-medical: Not on file  Tobacco Use  . Smoking status: Never Smoker  . Smokeless tobacco: Current User    Types: Chew  Substance and Sexual Activity  . Alcohol use: No    Alcohol/week: 0.0 standard drinks    Comment: hx of alcohol abuse - quit 2017  . Drug use: No  . Sexual activity: Not Currently  Lifestyle  . Physical activity    Days per week: Not on file    Minutes per session: Not on file  . Stress: Not on file  Relationships  . Social Herbalist on phone: Not on file    Gets together: Not on file    Attends religious service: Not on file    Active member of club or organization: Not on file    Attends meetings of clubs or organizations: Not on file    Relationship status: Not on file  Other Topics Concern  . Not on file  Social History Narrative  . Not on file   Allergies  Allergen Reactions  . Metformin And Related Diarrhea  . Wellbutrin [Bupropion] Other (See Comments)    tremors  . Lisinopril Cough   Family History  Problem Relation Age of Onset  . Colon cancer Father   . Hypertension Father   . Thyroid cancer Mother   .  Colon polyps Mother   . Thyroid disease Mother   . Thyroid disease Sister   . Thyroid disease Maternal Grandmother   . Alcohol abuse Neg Hx   . COPD Neg Hx   . Diabetes Neg Hx   . Early death Neg Hx   . Hearing loss Neg Hx   . Heart disease Neg Hx   . Hyperlipidemia Neg Hx   . Kidney disease Neg Hx   . Stroke Neg Hx     Current Outpatient Medications (Endocrine & Metabolic):  .  levothyroxine (SYNTHROID, LEVOTHROID) 75 MCG tablet, Take 1 tablet (75 mcg total) by mouth daily. .  Semaglutide, 1 MG/DOSE, (OZEMPIC, 1 MG/DOSE,) 2 MG/1.5ML SOPN, Inject 1 mg into the skin once a week. (Patient taking differently: Inject 1 mg into the skin every Tuesday. ) .  Testosterone 20.25 MG/ACT (1.62%) GEL, Apply 3 pumps on 1 shoulder and 2 pumps on the other every morning   Current Outpatient Medications (Cardiovascular):  .  propranolol (INDERAL) 40 MG tablet, Take 1 tablet (40 mg total) by mouth 3 (three) times daily. Marland Kitchen  triamterene-hydrochlorothiazide (MAXZIDE-25) 37.5-25 MG tablet, Take 1 tablet by mouth daily.    Current Outpatient Medications (Hematological):  .  cilostazol (PLETAL) 100 MG tablet, Take 1 tablet (100 mg total) by mouth 2 (two) times daily.  Current Outpatient Medications (Other):  Marland Kitchen  5-Hydroxytryptophan (5-HTP PO), Take 2 tablets by mouth daily. Marland Kitchen  ALPRAZolam (XANAX) 0.5 MG tablet, TAKE 1 TABLET(0.5 MG) BY MOUTH THREE TIMES DAILY AS NEEDED FOR ANXIETY .  Blood Glucose Monitoring Suppl ([place]) w/Device KIT, Use as instructed to check blood sugar once daily. .  Continuous Blood Gluc Receiver (FREESTYLE LIBRE READER) DEVI, 1 Act by Does not apply route daily. .  Continuous Blood Gluc Sensor ([place]) MISC, 1 Act by Does not apply route daily. Marland Kitchen  glucose blood test strip, Use onetouch verio flex test strip as instructed to check blood sugar once daily. .  Insulin Pen Needle (RELION PEN NEEDLES) 31G X 6 MM MISC, Use as needed for insulin injection. .  Lancets (ONETOUCH ULTRASOFT) lancets, Use onetouch delica lancet to check blood sugar once daily. .  Vitamin D, Ergocalciferol, (DRISDOL) 1.25 MG (50000 UT) CAPS capsule, TAKE 1 CAPSULE BY MOUTH ONCE A WEEK    Past medical history, social, surgical and family history all reviewed in electronic medical record.  No pertanent information unless stated regarding to the chief complaint.   Review of Systems:  No headache, visual changes, nausea, vomiting, diarrhea, constipation, dizziness, abdominal pain, skin rash, fevers, chills, night sweats, weight loss, swollen lymph nodes, body aches, joint swelling, chest pain, shortness of breath, mood changes.  Positive muscle aches  Objective  Blood pressure 124/84, pulse 90, height '6\' 2"'$  (1.88 m), weight (!)  380 lb (172.4 kg), SpO2 97 %.    General: No apparent distress alert and oriented x3 mood and affect normal, dressed appropriately.  Morbidly obese HEENT: Pupils equal, extraocular movements intact  Respiratory: Patient's speak in full sentences and does not appear short of breath  Cardiovascular: 2+ lower extremity edema significant varicose veins, non tender, no erythema  Skin: Warm dry intact with no signs of infection or rash on extremities or on axial skeleton.  Abdomen: Soft nontender  Neuro: Cranial nerves II through XII are intact, neurovascularly intact in all extremities with 2+ DTRs and 2+ pulses.  Lymph: No lymphadenopathy of posterior or anterior cervical chain or axillae bilaterally.  Gait antalgic MSK:  tender with full range of motion and good stability and symmetric strength and tone of shoulders, elbows, wrist, hip, knee and ankles bilaterally.  Left calf is mildly tender to palpation.  Patient does have significant varicose veins.  1+ pitting edema noted.  Patient does have some mild overpronation of the ankle.  Negative straight leg test on the back.  [id number]; 15 additional minutes spent for Therapeutic exercises as stated in above notes.  This included exercises focusing on stretching, strengthening, with significant focus on eccentric aspects.   Long term goals include an improvement in range of motion, strength, endurance as well as avoiding reinjury. Patient's frequency would include in 1-2 times a day, 3-5 times a week for a duration of 6-12 weeks. Ankle strengthening that included:  Basic range of motion exercises to allow proper full motion at ankle Stretching of the lower leg and hamstrings  Theraband exercises for the lower leg - inversion, eversion, dorsiflexion and plantarflexion each to be completed with a theraband Balance exercises to increase proprioception Weight bearing exercises to increase strength and balance  Proper technique shown and discussed handout in  great detail with ATC.  All questions were discussed and answered.     Impression and Recommendations:     This case required medical decision making of moderate complexity. The above documentation has been reviewed and is accurate and complete Lyndal Pulley, DO       Note: This dictation was prepared with Dragon dictation along with smaller phrase technology. Any transcriptional errors that result from this process are unintentional.

## 2019-05-06 ENCOUNTER — Other Ambulatory Visit: Payer: Self-pay

## 2019-05-06 ENCOUNTER — Ambulatory Visit: Payer: BC Managed Care – PPO | Admitting: Family Medicine

## 2019-05-06 ENCOUNTER — Encounter: Payer: Self-pay | Admitting: Family Medicine

## 2019-05-06 DIAGNOSIS — I739 Peripheral vascular disease, unspecified: Secondary | ICD-10-CM | POA: Insufficient documentation

## 2019-05-06 DIAGNOSIS — M216X2 Other acquired deformities of left foot: Secondary | ICD-10-CM | POA: Diagnosis not present

## 2019-05-06 HISTORY — DX: Peripheral vascular disease, unspecified: I73.9

## 2019-05-06 MED ORDER — CILOSTAZOL 100 MG PO TABS: 100.0000 mg | ORAL_TABLET | Freq: Two times a day (BID) | ORAL | 3 refills | Status: DC

## 2019-05-06 NOTE — Assessment & Plan Note (Signed)
Patient does have varicose veins and likely some mild peripheral vascular disease.  Compression stockings, icing regimen, discussed which activities of doing which wants to avoid.  Discussed over-the-counter orthotics for alignment issues.  Increase Pletal to 100 mg twice a day warned of potential side effects.  Follow-up with me again in 4 weeks

## 2019-05-06 NOTE — Patient Instructions (Signed)
Good to see you  pletal 100mg  twice daily and I sent in a new prescription  Love the idea of the compression socks when you know you will be doing a lot of walking.  [employer] orthotics "total support" online would be great  Exercises 3 times a week.  Keep trucking along  See me again in 4 weeks

## 2019-05-10 ENCOUNTER — Other Ambulatory Visit: Payer: Self-pay

## 2019-05-10 ENCOUNTER — Other Ambulatory Visit (INDEPENDENT_AMBULATORY_CARE_PROVIDER_SITE_OTHER): Payer: BC Managed Care – PPO

## 2019-05-10 DIAGNOSIS — E291 Testicular hypofunction: Secondary | ICD-10-CM | POA: Diagnosis not present

## 2019-05-10 LAB — CBC
HCT: 49.4 % (ref 39.0–52.0)
Hemoglobin: 17.1 g/dL — ABNORMAL HIGH (ref 13.0–17.0)
MCHC: 34.6 g/dL (ref 30.0–36.0)
MCV: 88.3 fl (ref 78.0–100.0)
Platelets: 231 10*3/uL (ref 150.0–400.0)
RBC: 5.59 Mil/uL (ref 4.22–5.81)
RDW: 14 % (ref 11.5–15.5)
WBC: 6.7 10*3/uL (ref 4.0–10.5)

## 2019-05-10 LAB — TESTOSTERONE: Testosterone: 348.03 ng/dL (ref 300.00–890.00)

## 2019-05-11 ENCOUNTER — Other Ambulatory Visit (INDEPENDENT_AMBULATORY_CARE_PROVIDER_SITE_OTHER): Payer: BC Managed Care – PPO

## 2019-05-11 ENCOUNTER — Encounter: Payer: Self-pay | Admitting: Nurse Practitioner

## 2019-05-11 ENCOUNTER — Ambulatory Visit: Payer: BC Managed Care – PPO | Admitting: Nurse Practitioner

## 2019-05-11 ENCOUNTER — Telehealth: Payer: Self-pay

## 2019-05-11 VITALS — BP 126/80 | HR 83 | Temp 98.3°F | Ht 72.0 in | Wt 374.5 lb

## 2019-05-11 DIAGNOSIS — R634 Abnormal weight loss: Secondary | ICD-10-CM

## 2019-05-11 DIAGNOSIS — R197 Diarrhea, unspecified: Secondary | ICD-10-CM

## 2019-05-11 LAB — IGA: IgA: 309 mg/dL (ref 68–378)

## 2019-05-11 NOTE — Telephone Encounter (Signed)
Covid-19 screening questions   Do you now or have you had a fever in the last 14 days? No  Do you have any respiratory symptoms of shortness of breath or cough now or in the last 14 days? No  Do you have any family members or close contacts with diagnosed or suspected Covid-19 in the past 14 days? No  Have you been tested for Covid-19 and found to be positive? No        

## 2019-05-11 NOTE — Progress Notes (Signed)
Chief Complaint:    diarrhea  IMPRESSION and PLAN:    63.  53 year old male with 58-monthhistory of diarrhea mainly nocturnal between hours of 11pm -2am.  Negative infectious work-up. Fecal elastase normal.  Recently completed a course of Xifaxan for possible SIBO.  His diarrhea got worse with Xifaxan but it has now started to improve which patient attributes to probiotic Align but I wonder if not a delayed response to the Xifaxan.   -Normal TSH. Etiology of diarrhea unclear at this point.  Patient correlates it with an increase in his Inderal dose though this would be unusual. Celiac very unlikely but will check labs. -Since diarrhea has improved to some degree will wait and see how things progress.   2. Involuntarily lost 20-25 pounds due to diminished appetite. This with the diarrhea is concerning.  -await celiac studies, I don't expect them to be abnormal.  -If symptoms persist consider CTAP, EGD and possibly even repeat colonoscopy to get random biopsies   HPI:    Patient is a 53yo male with OSA on CPAP, DM2, PVD and obesitywho underwent [place] with findings of small polyps, diverticulosis and hemorrhoids He had no GI complaints at that time but now mentions he had had episodic diarrhea for years.  KYvone Neucomes in for evaluation of diarrhea, diffuse abdominal discomfort and excessive gas present since colonoscopy. Of note the diarrhea ONLY occurs in the evening time. Abdominal discomfort does improve after expelling gas. Patient tried to figure out the cause of diarrhea. He stopped Ozempic for a while without any improvement.  He hasn't been able to correlate the diarrhea with any certain foods.  He does not use artificial sweeteners. Diarrhea got worse late May, had Telehealth visit with PCP 04/13/19- C. difficile, Giardia/Cryptosporidium, fecal elastase, O&P, fecal lactoferrin all negative.  He was treated empirically for SIBO with Xifaxan which made the  diarrhea worse. TSH is normal.   In the last several days there has been some improvement in the diarrhea which patient thinks may be secondary to initiation of align probiotic. Patient seems to correlate diarrhea with increase in beta blocker dose.    KYvone Neuhas voluntarily lost 20 to 25 pounds recently.  No early satiety.  No vomiting but the thought of certain foods make him feel nauseated.  He is not really concerned about the weight loss   Review of systems:     No chest pain, no SOB, no fevers, no urinary sx   Past Medical History:  Diagnosis Date   Hypertension    Obesity    OSA on CPAP    Type II diabetes mellitus with manifestations ([facility]     Patient's surgical history, family medical history, social history, medications and allergies were all reviewed in Epic   Creatinine clearance cannot be calculated (Patient's most recent lab result is older than the maximum 21 days allowed.)  Current Outpatient Medications  Medication Sig Dispense Refill   5-Hydroxytryptophan (5-HTP PO) Take 2 tablets by mouth daily.     ALPRAZolam (XANAX) 0.5 MG tablet TAKE 1 TABLET(0.5 MG) BY MOUTH THREE TIMES DAILY AS NEEDED FOR ANXIETY 270 tablet 0   Barberry-Oreg Grape-Goldenseal (BERBERINE COMPLEX PO) Take 2 capsules by mouth daily.     Blood Glucose Monitoring Suppl ([place] w/Device KIT Use as instructed to check blood sugar once daily. 1 kit 0   cilostazol (PLETAL) 100 MG tablet Take 1 tablet (100 mg  total) by mouth 2 (two) times daily. 60 tablet 3   Continuous Blood Gluc Receiver (FREESTYLE LIBRE READER) DEVI 1 Act by Does not apply route daily. 1 Device 11   Continuous Blood Gluc Sensor (FREESTYLE LIBRE SENSOR SYSTEM) MISC 1 Act by Does not apply route daily. 9 each 4   GLUCOSAMINE-CHONDROITIN PO Take 2 capsules by mouth daily. Should be 1500-1200 mg. No Vitamin D3 per patient     glucose blood test strip Use onetouch verio flex test strip as instructed to check  blood sugar once daily. 100 each 3   Insulin Pen Needle (RELION PEN NEEDLES) 31G X 6 MM MISC Use as needed for insulin injection. 100 each 3   Lancets (ONETOUCH ULTRASOFT) lancets Use onetouch delica lancet to check blood sugar once daily. 100 each 3   levothyroxine (SYNTHROID, LEVOTHROID) 75 MCG tablet Take 1 tablet (75 mcg total) by mouth daily. 90 tablet 3   Probiotic Product (ALIGN PO) Take 1 tablet by mouth daily.     propranolol (INDERAL) 40 MG tablet Take 1 tablet (40 mg total) by mouth 3 (three) times daily. 270 tablet 1   Semaglutide, 1 MG/DOSE, (OZEMPIC, 1 MG/DOSE,) 2 MG/1.5ML SOPN Inject 1 mg into the skin once a week. (Patient taking differently: Inject 1 mg into the skin every Tuesday. ) 12 pen 1   [place] PO Take 2 capsules by mouth daily. Should equal 73m daily per patient     Testosterone 20.25 MG/ACT (1.62%) GEL Apply 3 pumps on 1 shoulder and 2 pumps on the other every morning 225 g 2   triamterene-hydrochlorothiazide (MAXZIDE-25) 37.5-25 MG tablet Take 1 tablet by mouth daily. 90 tablet 0   Vitamin D, Ergocalciferol, (DRISDOL) 1.25 MG (50000 UT) CAPS capsule TAKE 1 CAPSULE BY MOUTH ONCE A WEEK 12 capsule 1   No current facility-administered medications for this visit.     Physical Exam:     BP 126/80    Pulse 83    Temp 98.3 F (36.8 C)    Ht 6' (1.829 m)    Wt (!) 374 lb 8 oz (169.9 kg)    BMI 50.79 kg/m   GENERAL:  Pleasant obese white male in NAD PSYCH: : Cooperative, normal affect EENT:  conjunctiva pink, mucous membranes moist, neck supple without masses CARDIAC:  RRR,  no peripheral edema PULM: Normal respiratory effort, lungs CTA bilaterally, no wheezing ABDOMEN:  Nondistended, soft, nontender. No obvious masses, normal bowel sounds SKIN:  turgor, no lesions seen Musculoskeletal:  Normal muscle tone, normal strength NEURO: Alert and oriented x 3, no focal neurologic deficits   PTye Savoy, NP 05/11/2019, 10:21 AM

## 2019-05-11 NOTE — Patient Instructions (Addendum)
If you are age 55 or older, your body mass index should be between 23-30. Your Body mass index is 50.79 kg/m. If this is out of the aforementioned range listed, please consider follow up with your Primary Care Provider.  If you are age 38 or younger, your body mass index should be between 19-25. Your Body mass index is 50.79 kg/m. If this is out of the aformentioned range listed, please consider follow up with your Primary Care Provider.   Your provider has requested that you go to the basement level for lab work before leaving today. Press "B" on the elevator. The lab is located at the first door on the left as you exit the elevator.  Keep a food diary.  Continue Align for additional 2 weeks. If not improved, then call Neurology about Inderal dose.  Follow up with me in early August.  Please call for an appointment as the schedule is not available at this time.    Thank you for choosing me and [facility].   Tye Savoy, NP

## 2019-05-12 ENCOUNTER — Encounter: Payer: Self-pay | Admitting: Nurse Practitioner

## 2019-05-12 LAB — TISSUE TRANSGLUTAMINASE, IGA: (tTG) Ab, IgA: 1 U/mL

## 2019-05-13 ENCOUNTER — Ambulatory Visit (INDEPENDENT_AMBULATORY_CARE_PROVIDER_SITE_OTHER): Payer: BC Managed Care – PPO | Admitting: Endocrinology

## 2019-05-13 ENCOUNTER — Encounter: Payer: Self-pay | Admitting: Endocrinology

## 2019-05-13 ENCOUNTER — Other Ambulatory Visit: Payer: Self-pay

## 2019-05-13 VITALS — BP 126/80 | Temp 98.3°F | Ht 72.0 in | Wt 374.0 lb

## 2019-05-13 DIAGNOSIS — E039 Hypothyroidism, unspecified: Secondary | ICD-10-CM

## 2019-05-13 DIAGNOSIS — E1169 Type 2 diabetes mellitus with other specified complication: Secondary | ICD-10-CM

## 2019-05-13 DIAGNOSIS — E669 Obesity, unspecified: Secondary | ICD-10-CM

## 2019-05-13 DIAGNOSIS — E291 Testicular hypofunction: Secondary | ICD-10-CM | POA: Diagnosis not present

## 2019-05-13 NOTE — Progress Notes (Signed)
Patient ID: Zachary Murray, male   DOB: 06-Dec-1965, 53 y.o.   MRN: [id number]              Today's office visit was provided via telemedicine using video technique Explained to the patient and the the limitations of evaluation and management by telemedicine and the availability of in person appointments.  The patient understood the limitations and agreed to proceed. Patient also understood that the telehealth visit is billable. . Location of the patient: Home . Location of the provider: Office Only the patient and myself were participating in the encounter   Chief complaint: Endocrinology follow-up  History of Present Illness  PROBLEM 1  Hypogonadismwas first diagnosed in 11/18  Since about 2016 or so he has had complaints offatigue He also feels malaise and listless, mostly in the afternoons and did not feel like doing anything at that time Also has some decreased libido  The diagnosis was confirmed by the low free T level of 6.6 done in 5/19 [facility] was inappropriately low at 6.3  He was on a trial of CLOMIPHENE 25 mg 3 times a week With this his energy level was improving but only partially and his libido was slightly better too This is despite his testosterone level being as high as 661, his LH had gone up to 20  Because of the lack of adequate symptomatic improvement he is taking AndroGel since 9/19 Appears to be requiring relatively large doses of AndroGel up to 6 pumps a day He previously was concerned about the large volume of application of the gel  With testosterone gel he was feeling significantly better with his energy level although at times will feel fatigued and sleepy  Since his testosterone level was 680 previously he was told to reduce the dose to 5 pumps instead of 6 when he was seen in 2/20 However because of his hemoglobin going up to 18.3 in 03/2019 he was told to reduce the dose to 4 pumps daily  He thinks that he is feeling more fatigued with the lower dose  and has decreased libido Testosterone level however is only slightly lower than previous level of 372 Hemoglobin is better at 17.1    Lab Results  Component Value Date   TESTOSTERONE 348.03 05/10/2019   TESTOSTERONE 372.34 03/29/2019   TESTOSTERONE 679.47 12/28/2018   TESTOSTERONE 219.89 (L) 10/29/2018    Prolactin level: 5.8  Lab Results  Component Value Date   LH 20.04 (H) 05/20/2018   Lab Results  Component Value Date   HGB 17.1 (H) 05/10/2019     PROBLEM 2: DIABETES:  Diagnosis date: ?  2016  Previous history:  Although he was diagnosed to have diabetes in 2016 he was not able to tolerate metformin that was previously prescribed Also was tried on Foley in 2017 and 2018 with some improved control He was tried on Bydureon in 06/2017 and continued until mil 3/19 d He was started on Ozempic and Tresiba insulin in 01/2018 by his PCP when his A1c was 7.8 and fasting glucose 165 Insulin was stopped in 5/19 since his blood sugar was averaging only 92 on his freestyle sensor at home  Recent history:     Non-insulin hypoglycemic drugs:   Ozempic 1 mg weekly  His most recent A1c was 6.9 compared to 6. 6 %   Current management, blood sugar patterns and problems identified:   He has used One Touch meter and his blood sugars are normal with a range of 65-119  although most of the readings appear to be in the afternoon and evening  He is not motivated to exercise despite reminders       Monitors blood glucose:   Previously with freestyle libre         Blood Glucose readings: Recent median glucose of 99 on a One Touch meter   Side effects from medications:  Diarrhea from metformin         Meals:  Usually low-fat        Physical activity: exercise: Very little walking             Dietician visit: Most recent:     Never Weight control:  Wt Readings from Last 3 Encounters:  05/13/19 (!) 374 lb (169.6 kg)  05/11/19 (!) 374 lb 8 oz (169.9 kg)  05/06/19 (!) 380 lb  (172.4 kg)   Lab Results  Component Value Date   HGBA1C 6.9 (H) 03/29/2019   HGBA1C 6.6 (H) 12/28/2018   HGBA1C 6.5 07/27/2018   Lab Results  Component Value Date   MICROALBUR 3.6 (H) 03/29/2019   LDLCALC 78 12/28/2018   CREATININE 0.97 03/29/2019    HYPOTHYROIDISM:  He was previously given levothyroxine for high TSH in 12/16 but later stopped because of improved TSH  Because of his fatigue he had been started on levothyroxine in 3/19 with only slight improvement in his fatigue He has had fatigue related to other concomitant problems  He has been taking 75 mcg of levothyroxine, this was increased by Dr. Tamala Julian when his TSH was upper normal in July 2019 TSH continues to be consistently normal     Lab Results  Component Value Date   TSH 2.28 03/29/2019   TSH 2.47 07/27/2018   TSH 4.24 05/20/2018   FREET4 1.07 03/29/2019   FREET4 0.82 07/21/2018   FREET4 0.92 03/30/2018        Allergies as of 05/13/2019      Reactions   Metformin And Related Diarrhea   Wellbutrin [bupropion] Other (See Comments)   tremors   Lisinopril Cough      Medication List       Accurate as of May 13, 2019  9:43 PM. If you have any questions, ask your nurse or doctor.        5-HTP PO Take 2 tablets by mouth daily.   ALIGN PO Take 1 tablet by mouth daily.   ALPRAZolam 0.5 MG tablet Commonly known as: XANAX TAKE 1 TABLET(0.5 MG) BY MOUTH THREE TIMES DAILY AS NEEDED FOR ANXIETY   BERBERINE COMPLEX PO Take 2 capsules by mouth daily.   cilostazol 100 MG tablet Commonly known as: PLETAL Take 1 tablet (100 mg total) by mouth 2 (two) times daily.   FreeStyle [employer] 1 Act by Does not apply route daily.   FreeStyle [employer] Misc 1 Act by Does not apply route daily.   GLUCOSAMINE-CHONDROITIN PO Take 2 capsules by mouth daily. Should be 1500-1200 mg. No Vitamin D3 per patient   glucose blood test strip Use onetouch verio flex test strip as instructed to  check blood sugar once daily.   Insulin Pen Needle 31G X 6 MM Misc Commonly known as: ReliOn Pen Needles Use as needed for insulin injection.   levothyroxine 75 MCG tablet Commonly known as: SYNTHROID Take 1 tablet (75 mcg total) by mouth daily.   onetouch ultrasoft lancets Use onetouch delica lancet to check blood sugar once daily.   OneTouch Verio [employer] w/Device Kit  Use as instructed to check blood sugar once daily.   propranolol 40 MG tablet Commonly known as: INDERAL Take 1 tablet (40 mg total) by mouth 3 (three) times daily.   Semaglutide (1 MG/DOSE) 2 MG/1.5ML Sopn Commonly known as: Ozempic (1 MG/DOSE) Inject 1 mg into the skin once a week. What changed: when to take this   ST JOHNS WORT PO Take 2 capsules by mouth daily. Should equal 784m daily per patient   Testosterone 20.25 MG/ACT (1.62%) Gel Apply 3 pumps on 1 shoulder and 2 pumps on the other every morning   triamterene-hydrochlorothiazide 37.5-25 MG tablet Commonly known as: MAXZIDE-25 Take 1 tablet by mouth daily.   Vitamin D (Ergocalciferol) 1.25 MG (50000 UT) Caps capsule Commonly known as: DRISDOL TAKE 1 CAPSULE BY MOUTH ONCE A WEEK       Allergies:  Allergies  Allergen Reactions  . Metformin And Related Diarrhea  . Wellbutrin [Bupropion] Other (See Comments)    tremors  . Lisinopril Cough    Past Medical History:  Diagnosis Date  . Hypertension   . Obesity   . OSA on CPAP   . Type II diabetes mellitus with manifestations ([facility]     Past Surgical History:  Procedure Laterality Date  . BIOPSY  08/31/2018   Procedure: BIOPSY;  Surgeon: NMauri Pole MD;  Location: [facility];  Service: Endoscopy;;  . COLONOSCOPY WITH PROPOFOL N/A 11/12/2016   Procedure: COLONOSCOPY WITH PROPOFOL;  Surgeon: KMauri Pole MD;  Location: [facility];  Service: Endoscopy;  Laterality: N/A;  . COLONOSCOPY WITH PROPOFOL N/A 08/31/2018   Procedure: COLONOSCOPY WITH PROPOFOL;  Surgeon:  NMauri Pole MD;  Location: [facility];  Service: Endoscopy;  Laterality: N/A;  . POLYPECTOMY  08/31/2018   Procedure: POLYPECTOMY;  Surgeon: NMauri Pole MD;  Location: [facility];  Service: Endoscopy;;  . WISDOM TOOTH EXTRACTION      Family History  Problem Relation Age of Onset  . Colon cancer Father   . Hypertension Father   . Thyroid cancer Mother   . Colon polyps Mother   . Thyroid disease Mother   . Thyroid disease Sister   . Thyroid disease Maternal Grandmother   . Alcohol abuse Neg Hx   . COPD Neg Hx   . Diabetes Neg Hx   . Early death Neg Hx   . Hearing loss Neg Hx   . Heart disease Neg Hx   . Hyperlipidemia Neg Hx   . Kidney disease Neg Hx   . Stroke Neg Hx   . Esophageal cancer Neg Hx     Social History:  reports that he has never smoked. His smokeless tobacco use includes chew. He reports that he does not drink alcohol or use drugs.  Review of Systems    LIPIDS: LDL below 100, not on a statin drug Followed by PCP  Lab Results  Component Value Date   CHOL 127 12/28/2018   HDL 38.00 (L) 12/28/2018   LDLCALC 78 12/28/2018   TRIG 55.0 12/28/2018   CHOLHDL 3 12/28/2018     General Examination:   BP 126/80 (BP Location: Left Arm, Patient Position: Sitting, Cuff Size: Large)   Temp 98.3 F (36.8 C) (Oral)   Ht 6' (1.829 m)   Wt (!) 374 lb (169.6 kg)   BMI 50.72 kg/m     Assessment/ Plan:   HYPOGONADOTROPIC HYPOGONADISM with symptoms of fatigue and low free testosterone at baseline  He has been symptomatic with fatigue and decreased  libido  He is now taking 4 pumps daily of the AndroGel Although his level is still about the same in the 300+ range he thinks he is having more fatigue  His dosage was reduced because of erythrocytosis However patient thinks that he wants to take a higher dose to increase his testosterone level  Discussed that if he wants to take the higher dose of testosterone gel he will need to work with his  PCP and get regular phlebotomies and will need to discuss this first with his PCP   DIABETES with morbid obesity  Blood sugars are fairly good at home However discussed need for weight loss and starting walking for exercise  Elayne Snare 05/13/2019, 9:43 PM     Note: This office note was prepared with Dragon voice recognition system technology. Any transcriptional errors that result from this process are unintentional.     Elayne Snare 05/13/2019, 9:43 PM

## 2019-05-17 ENCOUNTER — Ambulatory Visit (INDEPENDENT_AMBULATORY_CARE_PROVIDER_SITE_OTHER): Payer: BC Managed Care – PPO | Admitting: Internal Medicine

## 2019-05-17 ENCOUNTER — Other Ambulatory Visit: Payer: Self-pay | Admitting: Internal Medicine

## 2019-05-17 ENCOUNTER — Telehealth: Payer: Self-pay | Admitting: Plastic Surgery

## 2019-05-17 ENCOUNTER — Other Ambulatory Visit: Payer: Self-pay

## 2019-05-17 ENCOUNTER — Encounter: Payer: Self-pay | Admitting: Internal Medicine

## 2019-05-17 ENCOUNTER — Telehealth: Payer: Self-pay | Admitting: Endocrinology

## 2019-05-17 VITALS — BP 124/80 | HR 82 | Temp 98.0°F | Resp 16 | Ht 72.0 in | Wt 376.0 lb

## 2019-05-17 DIAGNOSIS — E039 Hypothyroidism, unspecified: Secondary | ICD-10-CM | POA: Diagnosis not present

## 2019-05-17 DIAGNOSIS — E669 Obesity, unspecified: Secondary | ICD-10-CM

## 2019-05-17 DIAGNOSIS — D751 Secondary polycythemia: Secondary | ICD-10-CM

## 2019-05-17 DIAGNOSIS — E1169 Type 2 diabetes mellitus with other specified complication: Secondary | ICD-10-CM

## 2019-05-17 DIAGNOSIS — I10 Essential (primary) hypertension: Secondary | ICD-10-CM | POA: Diagnosis not present

## 2019-05-17 DIAGNOSIS — E291 Testicular hypofunction: Secondary | ICD-10-CM

## 2019-05-17 NOTE — Telephone Encounter (Signed)
Pt aware of the message below and pt aware to he needs CBC and testosterone checked in 6 weeks. Pt will call back to schedule this.

## 2019-05-17 NOTE — Telephone Encounter (Signed)
-----   Message from [name], MD sent at 05/17/2019 11:29 AM EDT ----- Regarding: FW: Current problems [name] - He has decided to do phlebotomy so you can increase his T dose.  TJ  ----- Message ----- From: Elayne Snare, MD Sent: 05/13/2019   9:39 PM EDT To: [name], MD Subject: Current problems                               Zachary Murray:I Have Asked Zachary Murray to see you for follow-upI am concerned that some of his fatigue is related to depression and needs to be screened for thisAlso he is wanting to increase his testosterone level but this is causing polycythemia with hemoglobin 18.1 previouslyIf you do not find any reason for his fatigue I can increase his testosterone level but he will need to see you for periodic phlebotomy if you think this is feasible, thanks

## 2019-05-17 NOTE — Telephone Encounter (Signed)

## 2019-05-17 NOTE — Progress Notes (Signed)
Subjective:  Patient ID: Zachary Murray, male    DOB: 06-16-1966  Age: 53 y.o. MRN: [id number]  CC: Diabetes   [name] presents for f/up - He continues to complain of diarrhea having up to 5 loose stools a day.  He gets some symptom relief with Imodium.  He recently saw GI and the cause for the diarrhea has not been identified.  His plan is to taper off of propanolol.  It sounds like he is also going to undergo endoscopies with GI.  He recently saw his endocrinologist and was found to have a slightly elevated hemoglobin.  He complains of fatigue and muscle weakness and wants to increase his testosterone dosage.  Outpatient Medications Prior to Visit  Medication Sig Dispense Refill  . ALPRAZolam (XANAX) 0.5 MG tablet TAKE 1 TABLET(0.5 MG) BY MOUTH THREE TIMES DAILY AS NEEDED FOR ANXIETY 270 tablet 0  . Blood Glucose Monitoring Suppl ([place]) w/Device KIT Use as instructed to check blood sugar once daily. 1 kit 0  . cilostazol (PLETAL) 100 MG tablet Take 1 tablet (100 mg total) by mouth 2 (two) times daily. 60 tablet 3  . Continuous Blood Gluc Receiver (FREESTYLE LIBRE READER) DEVI 1 Act by Does not apply route daily. 1 Device 11  . Continuous Blood Gluc Sensor (FREESTYLE LIBRE SENSOR SYSTEM) MISC 1 Act by Does not apply route daily. 9 each 4  . GLUCOSAMINE-CHONDROITIN PO Take 2 capsules by mouth daily. Should be 1500-1200 mg. No Vitamin D3 per patient    . glucose blood test strip Use onetouch verio flex test strip as instructed to check blood sugar once daily. 100 each 3  . Insulin Pen Needle (RELION PEN NEEDLES) 31G X 6 MM MISC Use as needed for insulin injection. 100 each 3  . Lancets (ONETOUCH ULTRASOFT) lancets Use onetouch delica lancet to check blood sugar once daily. 100 each 3  . levothyroxine (SYNTHROID, LEVOTHROID) 75 MCG tablet Take 1 tablet (75 mcg total) by mouth daily. 90 tablet 3  . Probiotic Product (ALIGN PO) Take 1 tablet by mouth daily.    .  propranolol (INDERAL) 40 MG tablet Take 1 tablet (40 mg total) by mouth 3 (three) times daily. 270 tablet 1  . Semaglutide, 1 MG/DOSE, (OZEMPIC, 1 MG/DOSE,) 2 MG/1.5ML SOPN Inject 1 mg into the skin once a week. (Patient taking differently: Inject 1 mg into the skin every Tuesday. ) 12 pen 1  . ST JOHNS WORT PO Take 2 capsules by mouth daily. Should equal '700mg'$  daily per patient    . Testosterone 20.25 MG/ACT (1.62%) GEL Apply 3 pumps on 1 shoulder and 2 pumps on the other every morning 225 g 2  . triamterene-hydrochlorothiazide (MAXZIDE-25) 37.5-25 MG tablet Take 1 tablet by mouth daily. 90 tablet 0  . Vitamin D, Ergocalciferol, (DRISDOL) 1.25 MG (50000 UT) CAPS capsule TAKE 1 CAPSULE BY MOUTH ONCE A WEEK 12 capsule 1  . 5-Hydroxytryptophan (5-HTP PO) Take 2 tablets by mouth daily.    Jolyne Loa Grape-Goldenseal (BERBERINE COMPLEX PO) Take 2 capsules by mouth daily.     No facility-administered medications prior to visit.     ROS Review of Systems  Constitutional: Positive for fatigue. Negative for diaphoresis and unexpected weight change.  HENT: Negative.   Eyes: Negative for visual disturbance.  Respiratory: Negative for cough, chest tightness, shortness of breath and wheezing.   Cardiovascular: Negative for chest pain, palpitations and leg swelling.  Gastrointestinal: Positive for diarrhea. Negative  for abdominal pain, blood in stool, constipation, nausea and vomiting.  Endocrine: Negative.  Negative for cold intolerance, heat intolerance, polydipsia, polyphagia and polyuria.  Genitourinary: Negative.  Negative for difficulty urinating and dysuria.  Musculoskeletal: Negative.  Negative for arthralgias and neck pain.  Skin: Negative.   Neurological: Positive for weakness. Negative for dizziness, light-headedness and numbness.  Hematological: Negative for adenopathy. Does not bruise/bleed easily.  Psychiatric/Behavioral: Negative.     Objective:  BP 124/80 (BP Location: Left Arm,  Patient Position: Sitting, Cuff Size: Large)   Pulse 82   Temp 98 F (36.7 C) (Oral)   Resp 16   Ht 6' (1.829 m)   Wt (!) 376 lb (170.6 kg)   SpO2 98%   BMI 50.99 kg/m   BP Readings from Last 3 Encounters:  05/17/19 124/80  05/13/19 126/80  05/11/19 126/80    Wt Readings from Last 3 Encounters:  05/17/19 (!) 376 lb (170.6 kg)  05/13/19 (!) 374 lb (169.6 kg)  05/11/19 (!) 374 lb 8 oz (169.9 kg)    Physical Exam Vitals signs reviewed.  Constitutional:      Appearance: He is obese. He is not ill-appearing or diaphoretic.  HENT:     Nose: Nose normal.     Mouth/Throat:     Pharynx: No oropharyngeal exudate.  Eyes:     Conjunctiva/sclera: Conjunctivae normal.  Neck:     Musculoskeletal: Normal range of motion and neck supple. No neck rigidity.  Cardiovascular:     Rate and Rhythm: Normal rate.     Heart sounds: No murmur.  Pulmonary:     Effort: Pulmonary effort is normal.     Breath sounds: No stridor. No wheezing, rhonchi or rales.  Abdominal:     General: Abdomen is protuberant. Bowel sounds are normal. There is no distension.     Palpations: There is no hepatomegaly or splenomegaly.     Tenderness: There is no abdominal tenderness.  Musculoskeletal: Normal range of motion.     Right lower leg: No edema.     Left lower leg: No edema.  Lymphadenopathy:     Cervical: No cervical adenopathy.  Skin:    General: Skin is warm and dry.     Coloration: Skin is not pale.  Neurological:     General: No focal deficit present.  Psychiatric:        Mood and Affect: Mood normal.        Behavior: Behavior normal.     Lab Results  Component Value Date   WBC 6.7 05/10/2019   HGB 17.1 (H) 05/10/2019   HCT 49.4 05/10/2019   PLT 231.0 05/10/2019   GLUCOSE 136 (H) 03/29/2019   CHOL 127 12/28/2018   TRIG 55.0 12/28/2018   HDL 38.00 (L) 12/28/2018   LDLCALC 78 12/28/2018   ALT 26 03/29/2019   AST 26 03/29/2019   NA 138 03/29/2019   K 4.5 03/29/2019   CL 99  03/29/2019   CREATININE 0.97 03/29/2019   BUN 13 03/29/2019   CO2 31 03/29/2019   TSH 2.28 03/29/2019   PSA 0.47 03/26/2017   HGBA1C 6.9 (H) 03/29/2019   MICROALBUR 3.6 (H) 03/29/2019    No results found.  Assessment & Plan:   Zachary Murray was seen today for diabetes.  Diagnoses and all orders for this visit:  Diabetes mellitus type 2 in obese ([place])- His A1c is 6.9%.  His blood sugars are adequately well controlled. -     [facility] Diabetes Foot Exam  Essential  hypertension, benign- His blood pressure is adequately well controlled.  Acquired hypothyroidism- His recent TSH was in the normal range.  He will remain on the current dose of levothyroxine.  Hypogonadism male - His hemoglobin is up to 17.1.  He will undergo phlebotomy and blood transfusions to maintain hemoglobin as of about 14.  His endocrinologist will be increasing his testosterone dose.   I have discontinued Aaron Mose. Hosea "Ken"'s 5-Hydroxytryptophan (5-HTP PO) and Barberry-Oreg Grape-Goldenseal (BERBERINE COMPLEX PO). I am also having him maintain his FreeStyle Libre Reader, Insulin Pen Needle, levothyroxine, Semaglutide (1 MG/DOSE), FreeStyle [employer], Vitamin D (Ergocalciferol), propranolol, Testosterone, onetouch ultrasoft, glucose blood, OneTouch Verio Flex System, triamterene-hydrochlorothiazide, ALPRAZolam, cilostazol, ST JOHNS WORT PO, GLUCOSAMINE-CHONDROITIN PO, and Probiotic Product (ALIGN PO).  No orders of the defined types were placed in this encounter.    Follow-up: No follow-ups on file.  Scarlette Calico, MD

## 2019-05-17 NOTE — Telephone Encounter (Signed)
Please let patient know that I have had a discussion with Dr. Ronnald Ramp and he can increase testosterone gel back to total of 5 pumps each morning.  However he will need to have his CBC and testosterone lab work checked in about 6 weeks at either lab

## 2019-05-17 NOTE — Patient Instructions (Signed)
Type 2 Diabetes Mellitus, Diagnosis, Adult Type 2 diabetes (type 2 diabetes mellitus) is a long-term (chronic) disease. In type 2 diabetes, one or both of these problems may be present:  The pancreas does not make enough of a hormone called insulin.  Cells in the body do not respond properly to insulin that the body makes (insulin resistance). Normally, insulin allows blood sugar (glucose) to enter cells in the body. The cells use glucose for energy. Insulin resistance or lack of insulin causes excess glucose to build up in the blood instead of going into cells. As a result, high blood glucose (hyperglycemia) develops. What increases the risk? The following factors may make you more likely to develop type 2 diabetes:  Having a family member with type 2 diabetes.  Being overweight or obese.  Having an inactive (sedentary) lifestyle.  Having been diagnosed with insulin resistance.  Having a history of prediabetes, gestational diabetes, or polycystic ovary syndrome (PCOS).  Being of American-Indian, African-American, Hispanic/Latino, or Asian/Pacific Islander descent. What are the signs or symptoms? In the early stage of this condition, you may not have symptoms. Symptoms develop slowly and may include:  Increased thirst (polydipsia).  Increased hunger(polyphagia).  Increased urination (polyuria).  Increased urination during the night (nocturia).  Unexplained weight loss.  Frequent infections that keep coming back (recurring).  Fatigue.  Weakness.  Vision changes, such as blurry vision.  Cuts or bruises that are slow to heal.  Tingling or numbness in the hands or feet.  Dark patches on the skin (acanthosis nigricans). How is this diagnosed? This condition is diagnosed based on your symptoms, your medical history, a physical exam, and your blood glucose level. Your blood glucose may be checked with one or more of the following blood tests:  A fasting blood glucose (FBG)  test. You will not be allowed to eat (you will fast) for 8 hours or longer before a blood sample is taken.  A random blood glucose test. This test checks blood glucose at any time of day regardless of when you ate.  An A1c (hemoglobin A1c) blood test. This test provides information about blood glucose control over the previous 2-3 months.  An oral glucose tolerance test (OGTT). This test measures your blood glucose at two times: ? After fasting. This is your baseline blood glucose level. ? Two hours after drinking a beverage that contains glucose. You may be diagnosed with type 2 diabetes if:  Your FBG level is 126 mg/dL (7.0 mmol/L) or higher.  Your random blood glucose level is 200 mg/dL (11.1 mmol/L) or higher.  Your A1c level is 6.5% or higher.  Your OGTT result is higher than 200 mg/dL (11.1 mmol/L). These blood tests may be repeated to confirm your diagnosis. How is this treated? Your treatment may be managed by a specialist called an endocrinologist. Type 2 diabetes may be treated by following instructions from your health care provider about:  Making diet and lifestyle changes. This may include: ? Following an individualized nutrition plan that is developed by a diet and nutrition specialist (registered dietitian). ? Exercising regularly. ? Finding ways to manage stress.  Checking your blood glucose level as often as told.  Taking diabetes medicines or insulin daily. This helps to keep your blood glucose levels in the healthy range. ? If you use insulin, you may need to adjust the dosage depending on how physically active you are and what foods you eat. Your health care provider will tell you how to adjust your dosage.    Taking medicines to help prevent complications from diabetes, such as: ? Aspirin. ? Medicine to lower cholesterol. ? Medicine to control blood pressure. Your health care provider will set individualized treatment goals for you. Your goals will be based on  your age, other medical conditions you have, and how you respond to diabetes treatment. Generally, the goal of treatment is to maintain the following blood glucose levels:  Before meals (preprandial): 80-130 mg/dL (4.4-7.2 mmol/L).  After meals (postprandial): below 180 mg/dL (10 mmol/L).  A1c level: less than 7%. Follow these instructions at home: Questions to ask your health care provider  Consider asking the following questions: ? Do I need to meet with a diabetes educator? ? Where can I find a support group for people with diabetes? ? What equipment will I need to manage my diabetes at home? ? What diabetes medicines do I need, and when should I take them? ? How often do I need to check my blood glucose? ? What number can I call if I have questions? ? When is my next appointment? General instructions  Take over-the-counter and prescription medicines only as told by your health care provider.  Keep all follow-up visits as told by your health care provider. This is important.  For more information about diabetes, visit: ? American Diabetes Association (ADA): www.diabetes.org ? American Association of Diabetes Educators (AADE): www.diabeteseducator.org Contact a health care provider if:  Your blood glucose is at or above 240 mg/dL (13.3 mmol/L) for 2 days in a row.  You have been sick or have had a fever for 2 days or longer, and you are not getting better.  You have any of the following problems for more than 6 hours: ? You cannot eat or drink. ? You have nausea and vomiting. ? You have diarrhea. Get help right away if:  Your blood glucose is lower than 54 mg/dL (3.0 mmol/L).  You become confused or you have trouble thinking clearly.  You have difficulty breathing.  You have moderate or large ketone levels in your urine. Summary  Type 2 diabetes (type 2 diabetes mellitus) is a long-term (chronic) disease. In type 2 diabetes, the pancreas does not make enough of a  hormone called insulin, or cells in the body do not respond properly to insulin that the body makes (insulin resistance).  This condition is treated by making diet and lifestyle changes and taking diabetes medicines or insulin.  Your health care provider will set individualized treatment goals for you. Your goals will be based on your age, other medical conditions you have, and how you respond to diabetes treatment.  Keep all follow-up visits as told by your health care provider. This is important. This information is not intended to replace advice given to you by your health care provider. Make sure you discuss any questions you have with your health care provider. Document Released: 11/04/2005 Document Revised: 01/02/2018 Document Reviewed: 12/08/2015 Elsevier Patient Education  2020 Elsevier Inc.  

## 2019-05-18 ENCOUNTER — Ambulatory Visit: Payer: BC Managed Care – PPO | Admitting: Plastic Surgery

## 2019-05-18 ENCOUNTER — Other Ambulatory Visit (INDEPENDENT_AMBULATORY_CARE_PROVIDER_SITE_OTHER): Payer: BC Managed Care – PPO

## 2019-05-18 ENCOUNTER — Encounter: Payer: Self-pay | Admitting: Plastic Surgery

## 2019-05-18 DIAGNOSIS — E118 Type 2 diabetes mellitus with unspecified complications: Secondary | ICD-10-CM

## 2019-05-18 DIAGNOSIS — D751 Secondary polycythemia: Secondary | ICD-10-CM | POA: Diagnosis not present

## 2019-05-18 DIAGNOSIS — L723 Sebaceous cyst: Secondary | ICD-10-CM

## 2019-05-18 LAB — CBC WITH DIFFERENTIAL/PLATELET
Basophils Absolute: 0 10*3/uL (ref 0.0–0.1)
Basophils Relative: 0.5 % (ref 0.0–3.0)
Eosinophils Absolute: 0.2 10*3/uL (ref 0.0–0.7)
Eosinophils Relative: 2.7 % (ref 0.0–5.0)
HCT: 51 % (ref 39.0–52.0)
Hemoglobin: 17.2 g/dL — ABNORMAL HIGH (ref 13.0–17.0)
Lymphocytes Relative: 16 % (ref 12.0–46.0)
Lymphs Abs: 1.3 10*3/uL (ref 0.7–4.0)
MCHC: 33.8 g/dL (ref 30.0–36.0)
MCV: 89.1 fl (ref 78.0–100.0)
Monocytes Absolute: 0.9 10*3/uL (ref 0.1–1.0)
Monocytes Relative: 10.5 % (ref 3.0–12.0)
Neutro Abs: 5.8 10*3/uL (ref 1.4–7.7)
Neutrophils Relative %: 70.3 % (ref 43.0–77.0)
Platelets: 280 10*3/uL (ref 150.0–400.0)
RBC: 5.72 Mil/uL (ref 4.22–5.81)
RDW: 14 % (ref 11.5–15.5)
WBC: 8.3 10*3/uL (ref 4.0–10.5)

## 2019-05-18 NOTE — Progress Notes (Signed)
Patient ID: Zachary Murray, male    DOB: 07-26-1966, 53 y.o.   MRN: [id number]   Chief Complaint  Patient presents with  . Skin Problem    The patient is a 53 year old male here for evaluation of his right cheek.  He has a mass that is 1.5 x 2 cm in size.  It appears to be a cystlike structure just underneath the skin.  Is a little bit tender and a little bit red.  It is also raised.  He has had this removed in the past.  But is been there for several months and seems to be getting larger.  He has multiple medical conditions as noted below.  This appears to be a state of sebaceous cysts as opposed to a skin cancer.  He does not have a history of skin cancer but does have a family history of skin cancer.  Nothing makes it better.  It is getting larger with time.  It is located adjacent to the nasolabial fold of the right cheek.   Review of Systems  Constitutional: Negative.  Negative for activity change and appetite change.  HENT: Negative.   Eyes: Negative.   Respiratory: Negative.  Negative for chest tightness and shortness of breath.   Cardiovascular: Positive for leg swelling.  Gastrointestinal: Negative for abdominal pain.  Genitourinary: Negative.   Musculoskeletal: Negative.   Skin: Positive for color change and wound.    Past Medical History:  Diagnosis Date  . Hypertension   . Obesity   . OSA on CPAP   . Type II diabetes mellitus with manifestations [facility])     Past Surgical History:  Procedure Laterality Date  . BIOPSY  08/31/2018   Procedure: BIOPSY;  Surgeon: Mauri Pole, MD;  Location: [facility];  Service: Endoscopy;;  . COLONOSCOPY WITH PROPOFOL N/A 11/12/2016   Procedure: COLONOSCOPY WITH PROPOFOL;  Surgeon: Mauri Pole, MD;  Location: [facility];  Service: Endoscopy;  Laterality: N/A;  . COLONOSCOPY WITH PROPOFOL N/A 08/31/2018   Procedure: COLONOSCOPY WITH PROPOFOL;  Surgeon: Mauri Pole, MD;  Location: [facility];  Service:  Endoscopy;  Laterality: N/A;  . POLYPECTOMY  08/31/2018   Procedure: POLYPECTOMY;  Surgeon: Mauri Pole, MD;  Location: [facility];  Service: Endoscopy;;  . WISDOM TOOTH EXTRACTION        Current Outpatient Medications:  .  ALPRAZolam (XANAX) 0.5 MG tablet, TAKE 1 TABLET(0.5 MG) BY MOUTH THREE TIMES DAILY AS NEEDED FOR ANXIETY, Disp: 270 tablet, Rfl: 0 .  Blood Glucose Monitoring Suppl ([place]) w/Device KIT, Use as instructed to check blood sugar once daily., Disp: 1 kit, Rfl: 0 .  cilostazol (PLETAL) 100 MG tablet, Take 1 tablet (100 mg total) by mouth 2 (two) times daily., Disp: 60 tablet, Rfl: 3 .  Continuous Blood Gluc Receiver (FREESTYLE LIBRE READER) DEVI, 1 Act by Does not apply route daily., Disp: 1 Device, Rfl: 11 .  Continuous Blood Gluc Sensor ([place]) MISC, 1 Act by Does not apply route daily., Disp: 9 each, Rfl: 4 .  GLUCOSAMINE-CHONDROITIN PO, Take 2 capsules by mouth daily. Should be 1500-1200 mg. No Vitamin D3 per patient, Disp: , Rfl:  .  glucose blood test strip, Use onetouch verio flex test strip as instructed to check blood sugar once daily., Disp: 100 each, Rfl: 3 .  Insulin Pen Needle (RELION PEN NEEDLES) 31G X 6 MM MISC, Use as needed for insulin injection., Disp: 100 each,  Rfl: 3 .  Lancets (ONETOUCH ULTRASOFT) lancets, Use onetouch delica lancet to check blood sugar once daily., Disp: 100 each, Rfl: 3 .  levothyroxine (SYNTHROID, LEVOTHROID) 75 MCG tablet, Take 1 tablet (75 mcg total) by mouth daily., Disp: 90 tablet, Rfl: 3 .  Probiotic Product (ALIGN PO), Take 1 tablet by mouth daily., Disp: , Rfl:  .  propranolol (INDERAL) 40 MG tablet, Take 1 tablet (40 mg total) by mouth 3 (three) times daily., Disp: 270 tablet, Rfl: 1 .  Semaglutide, 1 MG/DOSE, (OZEMPIC, 1 MG/DOSE,) 2 MG/1.5ML SOPN, Inject 1 mg into the skin once a week. (Patient taking differently: Inject 1 mg into the skin every Tuesday. ), Disp: 12 pen, Rfl: 1  .  ST JOHNS WORT PO, Take 2 capsules by mouth daily. Should equal 765m daily per patient, Disp: , Rfl:  .  Testosterone 20.25 MG/ACT (1.62%) GEL, Apply 3 pumps on 1 shoulder and 2 pumps on the other every morning, Disp: 225 g, Rfl: 2 .  triamterene-hydrochlorothiazide (MAXZIDE-25) 37.5-25 MG tablet, Take 1 tablet by mouth daily., Disp: 90 tablet, Rfl: 0 .  Vitamin D, Ergocalciferol, (DRISDOL) 1.25 MG (50000 UT) CAPS capsule, TAKE 1 CAPSULE BY MOUTH ONCE A WEEK, Disp: 12 capsule, Rfl: 1   Objective:   Vitals:   05/18/19 0903  BP: 113/77  Pulse: 80  Temp: 98.3 F (36.8 C)  SpO2: 96%    Physical Exam Vitals signs and nursing note reviewed.  Constitutional:      Appearance: Normal appearance.  HENT:     Head: Normocephalic and atraumatic.   Cardiovascular:     Rate and Rhythm: Normal rate.  Pulmonary:     Effort: Pulmonary effort is normal. No respiratory distress.  Abdominal:     General: Abdomen is flat. There is no distension.  Skin:    General: Skin is warm.  Neurological:     General: No focal deficit present.     Mental Status: He is alert and oriented to person, place, and time.  Psychiatric:        Mood and Affect: Mood normal.        Behavior: Behavior normal.     Assessment & Plan:     ICD-10-CM   1. Severe obesity (BMI >= 40) (HCC)  E66.01   2. Type II diabetes mellitus with manifestations (HCC)  E11.8   3. Sebaceous cyst  L72.3    Recommend excision of right cheek sebaceous cyst. Pictures were obtained of the patient and placed in the chart with the patient's or guardian's permission.  CSouris DO

## 2019-05-25 ENCOUNTER — Encounter: Payer: Self-pay | Admitting: Family Medicine

## 2019-05-25 ENCOUNTER — Other Ambulatory Visit: Payer: Self-pay

## 2019-05-25 MED ORDER — VITAMIN D (ERGOCALCIFEROL) 1.25 MG (50000 UNIT) PO CAPS: 50000.0000 [IU] | ORAL_CAPSULE | ORAL | 1 refills | Status: DC

## 2019-05-26 ENCOUNTER — Other Ambulatory Visit: Payer: Self-pay | Admitting: Internal Medicine

## 2019-05-26 ENCOUNTER — Encounter: Payer: Self-pay | Admitting: Family Medicine

## 2019-05-26 ENCOUNTER — Other Ambulatory Visit: Payer: Self-pay

## 2019-05-26 DIAGNOSIS — E118 Type 2 diabetes mellitus with unspecified complications: Secondary | ICD-10-CM

## 2019-05-26 MED ORDER — CILOSTAZOL 100 MG PO TABS: 100.0000 mg | ORAL_TABLET | Freq: Two times a day (BID) | ORAL | 0 refills | Status: DC

## 2019-05-28 ENCOUNTER — Encounter: Payer: Self-pay | Admitting: Family Medicine

## 2019-05-28 MED ORDER — LEVOTHYROXINE SODIUM 75 MCG PO TABS: 75.0000 ug | ORAL_TABLET | Freq: Every day | ORAL | 3 refills | Status: DC

## 2019-06-02 ENCOUNTER — Encounter: Payer: Self-pay | Admitting: Family Medicine

## 2019-06-02 ENCOUNTER — Other Ambulatory Visit: Payer: Self-pay | Admitting: Endocrinology

## 2019-06-02 NOTE — Telephone Encounter (Signed)
Please refill if appropriate

## 2019-06-03 NOTE — Progress Notes (Signed)
Subjective:   Zachary Murray was seen in consultation in the movement disorder clinic for follow-up of essential tremor.  Last visit, primidone was started.  He reported that it helped the hand tremor, but made his fatigue worse.  Reports today that it made him dizzy.  He was agreeable then to starting propranolol, 20 mg twice daily.  We called him back to see how he was doing about a month later, but he did not answer that telephone call.  He returns today on 20 mg 3 times per day.  He reports today that hand tremor is definitely improved but "I would like to see them better controlled."  He states that head tremor is worse but "more episodic."  He states that he is still fatigued but thinks that he would like to come off of inderal to see how much is really the Inderal and how much may be other factors.  He does state that his testosterone level is now "leveled out" and he thinks that his fatigue is no longer related to this.  06/07/19 update: Patient seen today in follow-up for essential tremor.  His propranolol was increased last visit.  He was on propranolol, 20 mg 3 times per day.  Medical records are reviewed since last visit.  He saw primary care in May 17, 2019.  He was having frequent diarrhea.  Patient thought that the propranolol caused this, although GI did not think that this was the case.  I have reviewed their records.  Nonetheless, the patient reported that he was going to taper off of the propranolol.  He reports to me today that he is still on 40 mg tid.  "It alleviates the symptoms some but it doesn't do a real good job."  States that it helps for about 2-3 hours but then seems to wear off.  More trouble with cooking and "I don't trust myself with a knife."  Admits that tremor various with day - "sometimes I don't even seem to need medication and other days it doesn't even seem to help."  States that head tremor is becoming more bothersome to him.  States that he does better in the mornings  than he does in the afternoons.  Prior medications: Primidone (thought caused more fatigue, but does have chronic fatigue); propranolol (patient thought cause diarrhea, but he saw gastroenterology and they did not think that was the etiology -does not mention that to me)  Allergies  Allergen Reactions  . Metformin And Related Diarrhea  . Wellbutrin [Bupropion] Other (See Comments)    tremors  . Lisinopril Cough    Outpatient Encounter Medications as of 06/07/2019  Medication Sig  . ALPRAZolam (XANAX) 0.5 MG tablet TAKE 1 TABLET(0.5 MG) BY MOUTH THREE TIMES DAILY AS NEEDED FOR ANXIETY  . Blood Glucose Monitoring Suppl ([place]) w/Device KIT Use as instructed to check blood sugar once daily.  . cilostazol (PLETAL) 100 MG tablet Take 1 tablet (100 mg total) by mouth 2 (two) times daily.  . Continuous Blood Gluc Receiver (FREESTYLE LIBRE READER) DEVI 1 Act by Does not apply route daily.  . Continuous Blood Gluc Sensor (FREESTYLE LIBRE SENSOR SYSTEM) MISC 1 Act by Does not apply route daily.  Marland Kitchen GLUCOSAMINE-CHONDROITIN PO Take 2 capsules by mouth daily. Should be 1500-1200 mg. No Vitamin D3 per patient  . glucose blood test strip Use onetouch verio flex test strip as instructed to check blood sugar once daily.  . Insulin Pen Needle (RELION PEN  NEEDLES) 31G X 6 MM MISC Use as needed for insulin injection.  . Lancets (ONETOUCH ULTRASOFT) lancets Use onetouch delica lancet to check blood sugar once daily.  Marland Kitchen levothyroxine (SYNTHROID) 75 MCG tablet Take 1 tablet (75 mcg total) by mouth daily.  . propranolol (INDERAL) 40 MG tablet Take 1 tablet (40 mg total) by mouth 3 (three) times daily.  . Semaglutide, 1 MG/DOSE, (OZEMPIC, 1 MG/DOSE,) 2 MG/1.5ML SOPN Inject 1 mg into the skin every Tuesday.  . ST JOHNS WORT PO Take 2 capsules by mouth daily. Should equal 746m daily per patient  . Testosterone 20.25 MG/ACT (1.62%) GEL APPLY 3 PUMPS ON 1 SHOULDER AND 2 PUMPS ON THE OTHER EVERY  MORNING  . triamterene-hydrochlorothiazide (MAXZIDE-25) 37.5-25 MG tablet Take 1 tablet by mouth daily.  . Vitamin D, Ergocalciferol, (DRISDOL) 1.25 MG (50000 UT) CAPS capsule Take 1 capsule (50,000 Units total) by mouth once a week.  .Jolyne LoaGrape-Goldenseal (BERBERINE COMPLEX PO)   . [DISCONTINUED] Probiotic Product (ALIGN PO) Take 1 tablet by mouth daily.   No facility-administered encounter medications on file as of 06/07/2019.     Past Medical History:  Diagnosis Date  . Hypertension   . Obesity   . OSA on CPAP   . Type II diabetes mellitus with manifestations ([facility]     Past Surgical History:  Procedure Laterality Date  . BIOPSY  08/31/2018   Procedure: BIOPSY;  Surgeon: NMauri Pole MD;  [facility]: [facility];  Service: Endoscopy;;  . COLONOSCOPY WITH PROPOFOL N/A 11/12/2016   Procedure: COLONOSCOPY WITH PROPOFOL;  Surgeon: KMauri Pole MD;  Location: [facility];  Service: Endoscopy;  Laterality: N/A;  . COLONOSCOPY WITH PROPOFOL N/A 08/31/2018   Procedure: COLONOSCOPY WITH PROPOFOL;  Surgeon: NMauri Pole MD;  Location: [facility];  Service: Endoscopy;  Laterality: N/A;  . POLYPECTOMY  08/31/2018   Procedure: POLYPECTOMY;  Surgeon: NMauri Pole MD;  Location: [facility];  Service: Endoscopy;;  . WISDOM TOOTH EXTRACTION      Social History   Socioeconomic History  . Marital status: Single    Spouse name: Not on file  . Number of children: Not on file  . Years of education: Not on file  . Highest education level: Not on file  Occupational History  . Occupation: unemployed  Social Needs  . Financial resource strain: Not on file  . Food insecurity    Worry: Not on file    Inability: Not on file  . Transportation needs    Medical: Not on file    Non-medical: Not on file  Tobacco Use  . Smoking status: Never Smoker  . Smokeless tobacco: Current User    Types: Chew  Substance and Sexual Activity  . Alcohol use: No     Alcohol/week: 0.0 standard drinks    Comment: hx of alcohol abuse - quit 2017  . Drug use: No  . Sexual activity: Not Currently  Lifestyle  . Physical activity    Days per week: Not on file    Minutes per session: Not on file  . Stress: Not on file  Relationships  . Social [place] phone: Not on file    Gets together: Not on file    Attends religious service: Not on file    Active member of club or organization: Not on file    Attends meetings of clubs or organizations: Not on file    Relationship status: Not on file  . Intimate  partner violence    Fear of current or ex partner: Not on file    Emotionally abused: Not on file    Physically abused: Not on file    Forced sexual activity: Not on file  Other Topics Concern  . Not on file  Social History Narrative  . Not on file    Family Status  Relation Name Status  . Father  Alive  . Mother  Alive  . Sister  Alive  . MGM  (Not Specified)  . Neg Hx  (Not Specified)    Review of Systems Review of Systems  Constitutional: Positive for malaise/fatigue.  HENT: Negative.   Eyes: Negative.   Respiratory: Negative.   Cardiovascular: Negative.   Gastrointestinal: Negative.   Genitourinary: Negative.   Musculoskeletal: Negative.   Skin: Negative.      Objective:   VITALS:   Vitals:   06/07/19 0831  BP: 114/75  Pulse: 85  Temp: 98.4 F (36.9 C)  SpO2: 96%  Weight: (!) 367 lb 6.4 oz (166.7 kg)  Height: 6' (1.829 m)   GEN:  The patient appears stated age and is in NAD. HEENT:  Normocephalic, atraumatic.  The mucous membranes are moist. The superficial temporal arteries are without ropiness or tenderness. CV:  RRR Lungs:  CTAB Neck/HEME:  There are no carotid bruits bilaterally.  Neurological examination:  Orientation: The patient is alert and oriented x3. Cranial nerves: There is good facial symmetry. The speech is fluent and clear. Soft palate rises symmetrically and there is no tongue  deviation. Hearing is intact to conversational tone. Sensation: Sensation is intact to light touch throughout Motor: Strength is 5/5 in the bilateral upper and lower extremities.   Shoulder shrug is equal and symmetric.  There is no pronator drift.  Movement examination: Tone: There is normal tone in the upper and lower extremities. Abnormal movements: There is no rest tremor.  There is irregular postural tremor on the right, that is somewhat distractible.  There was none on the left today.  He did better with Archimedes spirals bilaterally today.  He had had tremor in the "no" direction that was intermittent. Coordination:  There is no decremation with RAM's, with any form of RAMS, including alternating supination and pronation of the forearm, hand opening and closing, finger taps, heel taps and toe taps. Gait and Station: The patient ambulates well.   Labs:   Chemistry      Component Value Date/Time   NA 138 03/29/2019 0944   K 4.5 03/29/2019 0944   CL 99 03/29/2019 0944   CO2 31 03/29/2019 0944   BUN 13 03/29/2019 0944   CREATININE 0.97 03/29/2019 0944      Component Value Date/Time   CALCIUM 10.0 03/29/2019 0944   ALKPHOS 69 03/29/2019 0944   AST 26 03/29/2019 0944   ALT 26 03/29/2019 0944   BILITOT 1.2 03/29/2019 0944     Lab Results  Component Value Date   TSH 2.28 03/29/2019   Lab Results  Component Value Date   HGBA1C 6.9 (H) 03/29/2019        Assessment/Plan:   1.  Essential Tremor.  -This is evidenced by the symmetrical nature and longstanding hx of gradually getting worse.  He thought the primidone made him fatigued, although does have issues with chronic fatigue.  Propranolol has helped some and we ultimately decided to continue 40 mg 3 times per day.  His Archimedes spirals are better.  His tremor was somewhat distractible today.  -Patient  thinks that tremor is becoming more bothersome.  He asks me what ask he can do.  He ultimately decided to try  trihexyphenidyl, low-dose, 2 mg twice per day.  We talked about risks, benefits, and side effects, including its anticholinergic side effects.  He was agreeable to try this.  -Patient asked about DBS therapy.  He is somewhat interested in this.  We talked about this briefly.  We did talk about the fact that diabetics have increased risk of infection with DBS and we would want his blood sugars under very good control (A1c currently 6.9, which is pretty good).  He would also need, as all patients do, neurocognitive testing, especially given his history of major depressive disorder.  We do not do DBS on patients who have uncontrolled depression.    2.  Hx alcoholism  -sober for 3 years  3.  Follow-up in the next 6 months, sooner should new neurologic issues arise.  CC:  [name], MD

## 2019-06-04 ENCOUNTER — Ambulatory Visit: Payer: BC Managed Care – PPO | Admitting: Family Medicine

## 2019-06-07 ENCOUNTER — Encounter: Payer: Self-pay | Admitting: Neurology

## 2019-06-07 ENCOUNTER — Other Ambulatory Visit: Payer: Self-pay

## 2019-06-07 ENCOUNTER — Ambulatory Visit (INDEPENDENT_AMBULATORY_CARE_PROVIDER_SITE_OTHER): Payer: BC Managed Care – PPO | Admitting: Neurology

## 2019-06-07 VITALS — BP 114/75 | HR 85 | Temp 98.4°F | Ht 72.0 in | Wt 367.4 lb

## 2019-06-07 DIAGNOSIS — G250 Essential tremor: Secondary | ICD-10-CM

## 2019-06-07 DIAGNOSIS — R7309 Other abnormal glucose: Secondary | ICD-10-CM | POA: Diagnosis not present

## 2019-06-07 MED ORDER — TRIHEXYPHENIDYL HCL 2 MG PO TABS: 2.0000 mg | ORAL_TABLET | Freq: Two times a day (BID) | ORAL | 1 refills | Status: DC

## 2019-06-07 MED ORDER — PROPRANOLOL HCL 40 MG PO TABS: 40.0000 mg | ORAL_TABLET | Freq: Three times a day (TID) | ORAL | 1 refills | Status: DC

## 2019-06-07 NOTE — Patient Instructions (Signed)
1.  Take trihexyphenidyl 2 mg twice per day with meal (either AM and lunch or AM and dinner)  2.  Continue propranolol 40 mg three times per day  The physicians and staff at [facility] are committed to providing excellent care. You may receive a survey requesting feedback about your experience at our office. We strive to receive "very good" responses to the survey questions. If you feel that your experience would prevent you from giving the office a "very good " response, please contact our office to try to remedy the situation. We may be reached at [phone]. Thank you for taking the time out of your busy day to complete the survey.

## 2019-06-10 NOTE — Progress Notes (Signed)
Reviewed and agree with documentation and assessment and plan. K. Veena Florian Chauca , MD   

## 2019-06-14 ENCOUNTER — Telehealth: Payer: Self-pay | Admitting: Plastic Surgery

## 2019-06-14 NOTE — Telephone Encounter (Signed)

## 2019-06-15 ENCOUNTER — Ambulatory Visit: Payer: BC Managed Care – PPO | Admitting: Plastic Surgery

## 2019-06-15 ENCOUNTER — Other Ambulatory Visit (HOSPITAL_COMMUNITY)
Admission: RE | Admit: 2019-06-15 | Discharge: 2019-06-15 | Disposition: A | Discharge status: Home / Self Care | Payer: BC Managed Care – PPO | Source: Ambulatory Visit | Attending: Plastic Surgery | Admitting: Plastic Surgery

## 2019-06-15 ENCOUNTER — Encounter

## 2019-06-15 ENCOUNTER — Encounter: Payer: Self-pay | Admitting: Plastic Surgery

## 2019-06-15 ENCOUNTER — Other Ambulatory Visit: Payer: Self-pay

## 2019-06-15 VITALS — BP 107/68 | HR 85 | Temp 98.2°F | Ht 72.0 in | Wt 362.6 lb

## 2019-06-15 DIAGNOSIS — L723 Sebaceous cyst: Secondary | ICD-10-CM | POA: Insufficient documentation

## 2019-06-15 NOTE — Progress Notes (Signed)
Preoperative Dx: Sebaceous cyst right cheek  Postoperative Dx: Same  Procedure: Excision of right cheek sebaceous cyst 1.5 centimeters  Surgeon: Dr. Lyndee Leo Dillingham  Anesthesia: Lidocaine 1% with 1:100,000 epinepherine  Description of Procedure: Risks and complications were explained to the patient.  Consent was confirmed and signed.  Time out was called and all information was confirmed to be correct.  The area was prepped with betadine and drapped.  Lidocaine 1% with epinepherine was injected in the subcutaneous area.  After waiting several minutes for the lidocaine to take affect a #15 blade was used to incise the skin over the lesion.  The tissue scissors were used to dissected the sac from the surrounding tissue.  The entire 1.5 cm lesion was removed.  I had the consistency of a sebaceous cyst.  A 5-0 Monocryl was used to close the skin edges.  Steri strips were applied.  The patient is to follow up in one week.  He tolerated the procedure well and there were no complications. The specimen was sent to pathology per the patient request.

## 2019-06-21 ENCOUNTER — Telehealth: Payer: Self-pay

## 2019-06-21 NOTE — Telephone Encounter (Signed)

## 2019-06-21 NOTE — Progress Notes (Signed)
   Subjective:     Patient ID: ROBERTLEE Murray, male    DOB: Jul 19, 1966, 53 y.o.   MRN: [id number]  Chief Complaint  Patient presents with  . Follow-up    HPI: The patient is a 53 y.o. male here for follow-up after excision of right cheek lesion on 06/15/2019 with Dr. Marla Roe.   Derm pathology showed benign epidermal inclusion cyst.  The patient is doing well.  Incision is healing well.  It is clean, dry, intact.  There is no erythema.  No drainage noted.  No sign of infection, hematoma, seroma.  He has not had any fevers or chills.   Review of Systems  Constitutional: Negative for chills, fever and malaise/fatigue.  Respiratory: Negative.   Cardiovascular: Negative.   Skin: Negative for itching and rash.    Objective:   Vital Signs BP 97/67 (BP Location: Left Arm, Patient Position: Sitting, Cuff Size: Large)   Pulse 83   Temp 98.4 F (36.9 C) (Temporal)   Ht 6' (1.829 m)   Wt (!) 366 lb (166 kg)   SpO2 92%   BMI 49.64 kg/m  Vital Signs and Nursing Note Reviewed  Physical Exam  Constitutional: He is oriented to person, place, and time and well-developed, well-nourished, and in no distress. No distress.  HENT:  Head: Normocephalic and atraumatic.    Cardiovascular: Normal rate.  Pulmonary/Chest: Effort normal.  Neurological: He is alert and oriented to person, place, and time. Gait normal.  Skin: Skin is warm and dry. No rash noted. He is not diaphoretic. No erythema. No pallor.    Assessment/Plan:     ICD-10-CM   1. Sebaceous cyst  L72.3     Mr. Six is doing very well.  Incision is healing well.  There is no sign of infection, hematoma, seroma.  Follow-up as needed.  2 sutures removed from his right cheek incision.   Carola Rhine Benney [place], PA-C 06/22/2019, 8:14 AM

## 2019-06-22 ENCOUNTER — Other Ambulatory Visit: Payer: Self-pay

## 2019-06-22 ENCOUNTER — Ambulatory Visit (INDEPENDENT_AMBULATORY_CARE_PROVIDER_SITE_OTHER): Payer: BC Managed Care – PPO | Admitting: Surgical

## 2019-06-22 ENCOUNTER — Encounter: Payer: Self-pay | Admitting: Surgical

## 2019-06-22 VITALS — BP 97/67 | HR 83 | Temp 98.4°F | Ht 72.0 in | Wt 366.0 lb

## 2019-06-22 DIAGNOSIS — L723 Sebaceous cyst: Secondary | ICD-10-CM

## 2019-07-07 ENCOUNTER — Ambulatory Visit: Payer: BC Managed Care – PPO | Admitting: Family Medicine

## 2019-08-30 ENCOUNTER — Other Ambulatory Visit: Payer: Self-pay | Admitting: Internal Medicine

## 2019-08-30 DIAGNOSIS — F411 Generalized anxiety disorder: Secondary | ICD-10-CM

## 2019-08-30 MED ORDER — ALPRAZOLAM 0.5 MG PO TABS: ORAL_TABLET | ORAL | 0 refills | Status: DC

## 2019-09-21 ENCOUNTER — Other Ambulatory Visit: Payer: Self-pay

## 2019-09-21 ENCOUNTER — Other Ambulatory Visit (INDEPENDENT_AMBULATORY_CARE_PROVIDER_SITE_OTHER): Payer: BC Managed Care – PPO

## 2019-09-21 DIAGNOSIS — E291 Testicular hypofunction: Secondary | ICD-10-CM

## 2019-09-21 DIAGNOSIS — E039 Hypothyroidism, unspecified: Secondary | ICD-10-CM | POA: Diagnosis not present

## 2019-09-21 DIAGNOSIS — E1169 Type 2 diabetes mellitus with other specified complication: Secondary | ICD-10-CM | POA: Diagnosis not present

## 2019-09-21 DIAGNOSIS — E669 Obesity, unspecified: Secondary | ICD-10-CM

## 2019-09-21 LAB — COMPREHENSIVE METABOLIC PANEL
ALT: 24 U/L (ref 0–53)
AST: 24 U/L (ref 0–37)
Albumin: 4.3 g/dL (ref 3.5–5.2)
Alkaline Phosphatase: 63 U/L (ref 39–117)
BUN: 14 mg/dL (ref 6–23)
CO2: 33 mEq/L — ABNORMAL HIGH (ref 19–32)
Calcium: 9.7 mg/dL (ref 8.4–10.5)
Chloride: 98 mEq/L (ref 96–112)
Creatinine, Ser: 1.04 mg/dL (ref 0.40–1.50)
GFR: 73.93 mL/min (ref >60.00)
Glucose, Bld: 104 mg/dL — ABNORMAL HIGH (ref 70–99)
Potassium: 4.3 mEq/L (ref 3.5–5.1)
Sodium: 137 mEq/L (ref 135–145)
Total Bilirubin: 1 mg/dL (ref 0.2–1.2)
Total Protein: 7.2 g/dL (ref 6.0–8.3)

## 2019-09-21 LAB — TSH: TSH: 1.99 u[IU]/mL (ref 0.35–4.50)

## 2019-09-21 LAB — CBC
HCT: 48.5 % (ref 39.0–52.0)
Hemoglobin: 16.4 g/dL (ref 13.0–17.0)
MCHC: 33.7 g/dL (ref 30.0–36.0)
MCV: 89.6 fl (ref 78.0–100.0)
Platelets: 242 10*3/uL (ref 150.0–400.0)
RBC: 5.42 Mil/uL (ref 4.22–5.81)
RDW: 14.1 % (ref 11.5–15.5)
WBC: 6.8 10*3/uL (ref 4.0–10.5)

## 2019-09-21 LAB — T4, FREE: Free T4: 1.41 ng/dL (ref 0.60–1.60)

## 2019-09-21 LAB — TESTOSTERONE: Testosterone: 212.2 ng/dL — ABNORMAL LOW (ref 300.00–890.00)

## 2019-09-21 LAB — T3, FREE: T3, Free: 3.2 pg/mL (ref 2.3–4.2)

## 2019-09-22 LAB — HEMOGLOBIN A1C: Hgb A1c MFr Bld: 5.8 % (ref 4.6–6.5)

## 2019-09-23 NOTE — Progress Notes (Signed)
Patient ID: Zachary Murray, male   DOB: [birth year], 53 y.o.   MRN: [id number]              Today's office visit was provided via telemedicine using video technique Explained to the patient and the the limitations of evaluation and management by telemedicine and the availability of in person appointments.  The patient understood the limitations and agreed to proceed. Patient also understood that the telehealth visit is billable. . Location of the patient: Home . Location of the provider: Office Only the patient and myself were participating in the encounter   Chief complaint: Endocrinology follow-up  History of Present Illness  PROBLEM 1  Hypogonadismwas first diagnosed in 11/18  Since about 2016 or so he has had complaints offatigue He also feels malaise and listless, mostly in the afternoons and did not feel like doing anything at that time Also has some decreased libido  The diagnosis was confirmed by the low free T level of 6.6 done in 5/19 [facility] was inappropriately low at 6.3  He was on a trial of CLOMIPHENE 25 mg 3 times a week With this his energy level and libido were improving but only partially This is despite his testosterone level being as high as 661, his LH had gone up to 20  Because of the lack of adequate symptomatic improvement he is taking AndroGel since 9/19 Appears to be requiring relatively large doses of AndroGel up to 6 pumps a day  With testosterone gel he was feeling significantly better with his energy level overall His dose has been adjusted periodic However because of his hemoglobin going up to 18.3 in 03/2019 he was told to reduce the dose to 4 pumps daily  With this he was complaining of feeling fatigued with the lower dose He is now taking 5 pumps a day on the AndroGel Is applying this daily very regularly  He thinks he has been significantly more fatigue since about July  Testosterone level however is lower than in June when his dose was  increased, and down to 212  His hemoglobin has been as high as 18 in the lab here and he thinks that on Monday at the [facility] it was 19. The next day after blood donation it is down to 16.4   Lab Results  Component Value Date   TESTOSTERONE 212.20 (L) 09/21/2019   TESTOSTERONE 348.03 05/10/2019   TESTOSTERONE 372.34 03/29/2019   TESTOSTERONE 679.47 12/28/2018    Prolactin level: 5.8  Lab Results  Component Value Date   LH 20.04 (H) 05/20/2018   Lab Results  Component Value Date   HGB 16.4 09/21/2019     PROBLEM 2: DIABETES:  Diagnosis date: ?  2016  Previous history:  Although he was diagnosed to have diabetes in 2016 he was not able to tolerate metformin that was previously prescribed Also was tried on Edcouch in 2017 and 2018 with some improved control He was tried on Bydureon in 06/2017 and continued until mil 3/19 d He was started on Ozempic and Tresiba insulin in 01/2018 by his PCP when his A1c was 7.8 and fasting glucose 165 Insulin was stopped in 5/19 since his blood sugar was averaging only 92 on his freestyle sensor at home  Recent history:     Non-insulin hypoglycemic drugs:   Ozempic 1 mg weekly  His most recent A1c is 5.8, was 6.9  Current management, blood sugar patterns and problems identified:   He has apparently lost significant amount  of weight this year and likely about 45 pounds  This is mostly from improving his diet and he thinks he is benefiting from some herbal remedies  He is somewhat more active but not doing any formal exercise  Checking blood sugars only rarely with his glucose meter  Lab glucose 104 at 9 AM       Monitors blood glucose:   Previously with freestyle libre         Blood Glucose readings:  Blood sugar range 65-139 with slightly higher readings after dinner Fasting range 77-98   Side effects from medications:  Diarrhea from metformin         Meals:  Usually low-fat                    Dietician visit: Most  recent:     Never  Weight control:  Wt Readings from Last 3 Encounters:  06/22/19 (!) 366 lb (166 kg)  06/15/19 (!) 362 lb 9.6 oz (164.5 kg)  06/07/19 (!) 367 lb 6.4 oz (166.7 kg)   Lab Results  Component Value Date   HGBA1C 5.8 09/21/2019   HGBA1C 6.9 (H) 03/29/2019   HGBA1C 6.6 (H) 12/28/2018   Lab Results  Component Value Date   MICROALBUR 3.6 (H) 03/29/2019   LDLCALC 78 12/28/2018   CREATININE 1.04 09/21/2019    HYPOTHYROIDISM:  He was previously given levothyroxine for high TSH in 12/16 but later stopped because of improved TSH  Because of his fatigue he had been started on levothyroxine in 3/19 with only slight improvement in his fatigue Recently complaining of fatigue as above  He has been taking 75 mcg of levothyroxine, this was increased by Dr. Tamala Julian when his TSH was upper normal in July 2019 TSH continues to be consistently normal     Lab Results  Component Value Date   TSH 1.99 09/21/2019   TSH 2.28 03/29/2019   TSH 2.47 07/27/2018   FREET4 1.41 09/21/2019   FREET4 1.07 03/29/2019   FREET4 0.82 07/21/2018        Allergies as of 09/24/2019      Reactions   Metformin And Related Diarrhea   Wellbutrin [bupropion] Other (See Comments)   tremors   Lisinopril Cough      Medication List       Accurate as of September 23, 2019  4:27 PM. If you have any questions, ask your nurse or doctor.        ALPRAZolam 0.5 MG tablet Commonly known as: XANAX TAKE 1 TABLET(0.5 MG) BY MOUTH THREE TIMES DAILY AS NEEDED FOR ANXIETY   cilostazol 100 MG tablet Commonly known as: PLETAL Take 1 tablet (100 mg total) by mouth 2 (two) times daily.   FreeStyle [employer] 1 Act by Does not apply route daily.   FreeStyle [employer] Misc 1 Act by Does not apply route daily.   glucose blood test strip Use onetouch verio flex test strip as instructed to check blood sugar once daily.   Insulin Pen Needle 31G X 6 MM Misc Commonly known as: ReliOn  Pen Needles Use as needed for insulin injection.   levothyroxine 75 MCG tablet Commonly known as: SYNTHROID Take 1 tablet (75 mcg total) by mouth daily.   onetouch ultrasoft lancets Use onetouch delica lancet to check blood sugar once daily.   OneTouch Verio Flex System w/Device Kit Use as instructed to check blood sugar once daily.   Ozempic (1 MG/DOSE) 2 MG/1.5ML Sopn Generic  drug: Semaglutide (1 MG/DOSE) Inject 1 mg into the skin every Tuesday.   propranolol 40 MG tablet Commonly known as: INDERAL Take 1 tablet (40 mg total) by mouth 3 (three) times daily.   ST JOHNS WORT PO Take 2 capsules by mouth daily. Should equal '700mg'$  daily per patient   Testosterone 20.25 MG/ACT (1.62%) Gel APPLY 3 PUMPS ON 1 SHOULDER AND 2 PUMPS ON THE OTHER EVERY MORNING   triamterene-hydrochlorothiazide 37.5-25 MG tablet Commonly known as: MAXZIDE-25 Take 1 tablet by mouth daily.   trihexyphenidyl 2 MG tablet Commonly known as: ARTANE Take 1 tablet (2 mg total) by mouth 2 (two) times daily with a meal.   Vitamin D (Ergocalciferol) 1.25 MG (50000 UT) Caps capsule Commonly known as: DRISDOL Take 1 capsule (50,000 Units total) by mouth once a week.       Allergies:  Allergies  Allergen Reactions  . Metformin And Related Diarrhea  . Wellbutrin [Bupropion] Other (See Comments)    tremors  . Lisinopril Cough    Past Medical History:  Diagnosis Date  . Hypertension   . Obesity   . OSA on CPAP   . Type II diabetes mellitus with manifestations [facility])     Past Surgical History:  Procedure Laterality Date  . BIOPSY  08/31/2018   Procedure: BIOPSY;  Surgeon: Mauri Pole, MD;  Location: [facility]COPY;  Service: Endoscopy;;  . COLONOSCOPY WITH PROPOFOL N/A 11/12/2016   Procedure: COLONOSCOPY WITH PROPOFOL;  Surgeon: Mauri Pole, MD;  Location: [facility]COPY;  Service: Endoscopy;  Laterality: N/A;  . COLONOSCOPY WITH PROPOFOL N/A 08/31/2018   Procedure: COLONOSCOPY WITH  PROPOFOL;  Surgeon: Mauri Pole, MD;  Location: [facility];  Service: Endoscopy;  Laterality: N/A;  . POLYPECTOMY  08/31/2018   Procedure: POLYPECTOMY;  Surgeon: Mauri Pole, MD;  Location: [facility]OSCOPY;  Service: Endoscopy;;  . WISDOM TOOTH EXTRACTION      Family History  Problem Relation Age of Onset  . Colon cancer Father   . Hypertension Father   . Thyroid cancer Mother   . Colon polyps Mother   . Thyroid disease Mother   . Thyroid disease Sister   . Thyroid disease Maternal Grandmother   . Alcohol abuse Neg Hx   . COPD Neg Hx   . Diabetes Neg Hx   . Early death Neg Hx   . Hearing loss Neg Hx   . Heart disease Neg Hx   . Hyperlipidemia Neg Hx   . Kidney disease Neg Hx   . Stroke Neg Hx   . Esophageal cancer Neg Hx     Social History:  reports that he has never smoked. His smokeless tobacco use includes chew. He reports that he does not drink alcohol or use drugs.  Review of Systems    LIPIDS: LDL below 100, not on a statin drug Followed by PCP  Lab Results  Component Value Date   CHOL 127 12/28/2018   HDL 38.00 (L) 12/28/2018   LDLCALC 78 12/28/2018   TRIG 55.0 12/28/2018   CHOLHDL 3 12/28/2018     General Examination:   There were no vitals taken for this visit.    Assessment/ Plan:   HYPOGONADOTROPIC HYPOGONADISM with symptoms of fatigue and low free testosterone at baseline  He has been symptomatic with fatigue and decreased libido  He is now taking 5 pumps daily of the AndroGel This is since about June this year and the dose was increased because he was feeling fatigued even  with the testosterone level being over 300  He has been compliant with his regimen every day He has been previously concerned about using the large volume of the gel daily  Since he is not getting adequate levels with 5 pounds he will be given a trial of Xyosted, this will require prior authorization process from his insurance  He does appear to be  getting erythrocytosis and reportedly hemoglobin 19 even with his testosterone level being low and may be having a independent problem with polycythemia Will have this reviewed by his PCP   DIABETES with morbid obesity  He appears to have lost a significant amount of weight A1c is now below 6 Blood sugars are fairly good at home including after meals He was reminded to start exercise regimen also  FATIGUE: Etiology unclear as this does not always correlate with thyroid levels or testosterone level, to follow-up with PCP  Elayne Snare 09/23/2019, 4:27 PM  Total visit time for evaluation and management of multiple problems and counseling =25 minutes   Note: This office note was prepared with Dragon voice recognition system technology. Any transcriptional errors that result from this process are unintentional.     Elayne Snare 09/23/2019, 4:27 PM

## 2019-09-24 ENCOUNTER — Ambulatory Visit (INDEPENDENT_AMBULATORY_CARE_PROVIDER_SITE_OTHER): Payer: BC Managed Care – PPO | Admitting: Endocrinology

## 2019-09-24 ENCOUNTER — Other Ambulatory Visit: Payer: Self-pay

## 2019-09-24 ENCOUNTER — Encounter: Payer: Self-pay | Admitting: Endocrinology

## 2019-09-24 DIAGNOSIS — E669 Obesity, unspecified: Secondary | ICD-10-CM

## 2019-09-24 DIAGNOSIS — D751 Secondary polycythemia: Secondary | ICD-10-CM

## 2019-09-24 DIAGNOSIS — E1169 Type 2 diabetes mellitus with other specified complication: Secondary | ICD-10-CM

## 2019-09-24 DIAGNOSIS — E291 Testicular hypofunction: Secondary | ICD-10-CM | POA: Diagnosis not present

## 2019-09-24 DIAGNOSIS — R5382 Chronic fatigue, unspecified: Secondary | ICD-10-CM | POA: Diagnosis not present

## 2019-09-24 MED ORDER — XYOSTED 75 MG/0.5ML ~~LOC~~ SOAJ: 75.0000 mg | SUBCUTANEOUS | 1 refills | Status: DC

## 2019-09-27 ENCOUNTER — Telehealth: Payer: Self-pay

## 2019-09-27 NOTE — Telephone Encounter (Signed)
PA initiated via CoverMyMeds for Xyosted 75mg /0.27mL pen injectors  Ian Mednick Key: [id number] help? Call us at [phone] Status Sent to [employer] Xyosted 75MG /0.5ML auto-injectors Form [employer] [place] Commercial Electronic Request Form (CB)

## 2019-09-28 ENCOUNTER — Telehealth: Payer: Self-pay | Admitting: Endocrinology

## 2019-09-28 NOTE — Telephone Encounter (Signed)
Noted. Paperwork was already received for this approval.

## 2019-09-28 NOTE — Telephone Encounter (Signed)
Champ Mungo with [employer] ph# [phone], option 3 then option 1 called re: status of PA for [facility]. Notify you  it was approved.

## 2019-10-10 ENCOUNTER — Other Ambulatory Visit: Payer: Self-pay | Admitting: Family

## 2019-10-10 DIAGNOSIS — I10 Essential (primary) hypertension: Secondary | ICD-10-CM

## 2019-11-03 DIAGNOSIS — H2513 Age-related nuclear cataract, bilateral: Secondary | ICD-10-CM | POA: Diagnosis not present

## 2019-11-03 DIAGNOSIS — H35363 Drusen (degenerative) of macula, bilateral: Secondary | ICD-10-CM | POA: Diagnosis not present

## 2019-11-03 DIAGNOSIS — H524 Presbyopia: Secondary | ICD-10-CM | POA: Diagnosis not present

## 2019-11-03 DIAGNOSIS — E119 Type 2 diabetes mellitus without complications: Secondary | ICD-10-CM | POA: Diagnosis not present

## 2019-11-03 DIAGNOSIS — H35033 Hypertensive retinopathy, bilateral: Secondary | ICD-10-CM | POA: Diagnosis not present

## 2019-11-03 LAB — HM DIABETES EYE EXAM

## 2019-11-09 ENCOUNTER — Encounter: Payer: Self-pay | Admitting: Neurology

## 2019-11-11 ENCOUNTER — Encounter: Payer: Self-pay | Admitting: Internal Medicine

## 2019-11-24 ENCOUNTER — Other Ambulatory Visit: Payer: Self-pay | Admitting: Endocrinology

## 2019-11-24 NOTE — Telephone Encounter (Signed)
Please refill if appropriate

## 2019-12-03 NOTE — Progress Notes (Signed)
Virtual Visit via Video Note The purpose of this virtual visit is to provide medical care while limiting exposure to the novel coronavirus.    Consent was obtained for video visit:  Yes.   Answered questions that patient had about telehealth interaction:  Yes.   I discussed the limitations, risks, security and privacy concerns of performing an evaluation and management service by telemedicine. I also discussed with the patient that there may be a patient responsible charge related to this service. The patient expressed understanding and agreed to proceed.  Pt location: Home Physician Location: office Name of referring provider:  [name], MD I connected with Brock Ra at patients initiation/request on 12/06/2019 at  8:45 AM EST by video enabled telemedicine application and verified that I am speaking with the correct person using two identifiers. Pt MRN:  [id number] Pt DOB:  01-22-1966 Video Participants:  Brock Ra;     History of Present Illness:  Patient seen today in follow-up for tremor.  Reports that "something is causing me to have tired and weak eyes."  Doesn't really think that it is blurred vision but maybe.  Thinks that he is more nervous and attributes that to some medication and "my xanax use is more because of it."  States that tremor is getting worse, esp in the L hand.  He writes with the R hand but otherwise he considers himself to be L hand dominant.  States that BP has been running higher than in the past.  Resting pulse is in the low 80's.  Continues to have fatigue and wonders if from medication.  Medical records are reviewed since last visit, including those from endocrinology.  Current movement d/o meds:  Propranolol, 40 mg 3 times per day Trihexyphenidyl, 2 mg twice per day (started last visit)   Current Outpatient Medications on File Prior to Visit  Medication Sig Dispense Refill  . ALPRAZolam (XANAX) 0.5 MG tablet TAKE 1 TABLET(0.5 MG) BY MOUTH THREE  TIMES DAILY AS NEEDED FOR ANXIETY 270 tablet 0  . Blood Glucose Monitoring Suppl ([place]) w/Device KIT Use as instructed to check blood sugar once daily. 1 kit 0  . cilostazol (PLETAL) 100 MG tablet Take 1 tablet (100 mg total) by mouth 2 (two) times daily. 180 tablet 0  . Continuous Blood Gluc Receiver (FREESTYLE LIBRE READER) DEVI 1 Act by Does not apply route daily. 1 Device 11  . Continuous Blood Gluc Sensor (FREESTYLE LIBRE SENSOR SYSTEM) MISC 1 Act by Does not apply route daily. 9 each 4  . glucose blood test strip Use onetouch verio flex test strip as instructed to check blood sugar once daily. 100 each 3  . Insulin Pen Needle (RELION PEN NEEDLES) 31G X 6 MM MISC Use as needed for insulin injection. 100 each 3  . Lancets (ONETOUCH ULTRASOFT) lancets Use onetouch delica lancet to check blood sugar once daily. 100 each 3  . levothyroxine (SYNTHROID) 75 MCG tablet Take 1 tablet (75 mcg total) by mouth daily. 90 tablet 3  . propranolol (INDERAL) 40 MG tablet Take 1 tablet (40 mg total) by mouth 3 (three) times daily. 270 tablet 1  . Semaglutide, 1 MG/DOSE, (OZEMPIC, 1 MG/DOSE,) 2 MG/1.5ML SOPN Inject 1 mg into the skin every Tuesday. 12 pen 1  . ST JOHNS WORT PO Take 2 capsules by mouth daily. Should equal '700mg'$  daily per patient    . triamterene-hydrochlorothiazide (MAXZIDE-25) 37.5-25 MG tablet TAKE 1 TABLET BY MOUTH DAILY  90 tablet 0  . Vitamin D, Ergocalciferol, (DRISDOL) 1.25 MG (50000 UT) CAPS capsule Take 1 capsule (50,000 Units total) by mouth once a week. 12 capsule 1  . XYOSTED 75 MG/0.5ML SOAJ INJECT 75 MG INTO SKIN EVERY WEEK (Patient not taking: Reported on 12/06/2019) 2 mL 0   No current facility-administered medications on file prior to visit.     Observations/Objective:   There were no vitals filed for this visit. GEN:  The patient appears stated age and is in NAD.  Neurological examination:  Orientation: The patient is alert and oriented x3. Cranial  nerves: There is good facial symmetry. There is no facial hypomimia.  The speech is fluent and clear. Soft palate rises symmetrically and there is no tongue deviation. Hearing is intact to conversational tone. Motor: Strength is at least antigravity x 4.   Shoulder shrug is equal and symmetric.  There is no pronator drift.  Movement examination: Tone: unable Abnormal movements: No rest tremor.  He has no postural tremor.  He has very little intention tremor.  He does have evidence of tremor with Archimedes spirals, left more than right. Coordination:  There is now decremation with RAM's Gait and Station: The patient ambulates well in his hall.  I have reviewed and interpreted the following labs independently   Chemistry      Component Value Date/Time   NA 137 09/21/2019 0923   K 4.3 09/21/2019 0923   CL 98 09/21/2019 0923   CO2 33 (H) 09/21/2019 0923   BUN 14 09/21/2019 0923   CREATININE 1.04 09/21/2019 0923      Component Value Date/Time   CALCIUM 9.7 09/21/2019 0923   ALKPHOS 63 09/21/2019 0923   AST 24 09/21/2019 0923   ALT 24 09/21/2019 0923   BILITOT 1.0 09/21/2019 0923     Lab Results  Component Value Date   WBC 6.8 09/21/2019   HGB 16.4 09/21/2019   HCT 48.5 09/21/2019   MCV 89.6 09/21/2019   PLT 242.0 09/21/2019     Assessment and Plan:   1.  Tremor  -Continue propranolol, 40 mg 3 times daily  -d/c artane as thought that it caused him to have weak eyes.  Wondered if that caused dry eyes if that he perceives as dry eye  -Had primidone in the past made him fatigued, although he has history of chronic fatigue, and complains about that today even off of primidone.  We discussed this today and ultimately decided to retry it.  We will start primidone, 50 mg, half tablet at night for 1 week and then increase to 1 tablet nightly.  Discussed risk, benefits, and side effects.  -Discussed DBS.  Discussed potential issues, especially in him. He has a history of major  depression, and if this is not well controlled, this would be a contraindication to DBS.  In addition, tremor has been intermittently distractible.  -discussed FUS today.  Discussed closest location in [place]. 2.  History of alcoholism  -Now sober for many years  Follow Up Instructions:    -I discussed the assessment and treatment plan with the patient. The patient was provided an opportunity to ask questions and all were answered. The patient agreed with the plan and demonstrated an understanding of the instructions.   The patient was advised to call back or seek an in-person evaluation if the symptoms worsen or if the condition fails to improve as anticipated.    Total time spent on today's visit was 20 minutes, including both  face-to-face time and nonface-to-face time.  Time included that spent on review of records (prior notes available to me/labs/imaging if pertinent), discussing treatment and goals, answering patient's questions and coordinating care.   Alonza Bogus, DO

## 2019-12-06 ENCOUNTER — Other Ambulatory Visit: Payer: Self-pay

## 2019-12-06 ENCOUNTER — Encounter: Payer: Self-pay | Admitting: Neurology

## 2019-12-06 ENCOUNTER — Telehealth (INDEPENDENT_AMBULATORY_CARE_PROVIDER_SITE_OTHER): Payer: BC Managed Care – PPO | Admitting: Neurology

## 2019-12-06 DIAGNOSIS — G250 Essential tremor: Secondary | ICD-10-CM

## 2019-12-06 DIAGNOSIS — R5382 Chronic fatigue, unspecified: Secondary | ICD-10-CM | POA: Diagnosis not present

## 2019-12-06 MED ORDER — PRIMIDONE 50 MG PO TABS: 50.0000 mg | ORAL_TABLET | Freq: Every day | ORAL | 1 refills | Status: DC

## 2019-12-08 ENCOUNTER — Other Ambulatory Visit: Payer: Self-pay

## 2019-12-08 ENCOUNTER — Encounter: Payer: Self-pay | Admitting: Family Medicine

## 2019-12-08 MED ORDER — VITAMIN D (ERGOCALCIFEROL) 1.25 MG (50000 UNIT) PO CAPS: 50000.0000 [IU] | ORAL_CAPSULE | ORAL | 0 refills | Status: DC

## 2019-12-09 ENCOUNTER — Other Ambulatory Visit: Payer: Self-pay

## 2019-12-09 ENCOUNTER — Other Ambulatory Visit (INDEPENDENT_AMBULATORY_CARE_PROVIDER_SITE_OTHER): Payer: BC Managed Care – PPO

## 2019-12-09 DIAGNOSIS — E669 Obesity, unspecified: Secondary | ICD-10-CM

## 2019-12-09 DIAGNOSIS — E1169 Type 2 diabetes mellitus with other specified complication: Secondary | ICD-10-CM | POA: Diagnosis not present

## 2019-12-09 DIAGNOSIS — E291 Testicular hypofunction: Secondary | ICD-10-CM

## 2019-12-09 DIAGNOSIS — D751 Secondary polycythemia: Secondary | ICD-10-CM

## 2019-12-09 LAB — CBC
HCT: 45.3 % (ref 39.0–52.0)
Hemoglobin: 15.3 g/dL (ref 13.0–17.0)
MCHC: 33.8 g/dL (ref 30.0–36.0)
MCV: 89 fl (ref 78.0–100.0)
Platelets: 234 10*3/uL (ref 150.0–400.0)
RBC: 5.1 Mil/uL (ref 4.22–5.81)
RDW: 13.5 % (ref 11.5–15.5)
WBC: 6.4 10*3/uL (ref 4.0–10.5)

## 2019-12-09 LAB — TESTOSTERONE: Testosterone: 297 ng/dL — ABNORMAL LOW (ref 300.00–890.00)

## 2019-12-09 LAB — GLUCOSE, RANDOM: Glucose, Bld: 106 mg/dL — ABNORMAL HIGH (ref 70–99)

## 2019-12-10 ENCOUNTER — Encounter: Payer: Self-pay | Admitting: Family Medicine

## 2019-12-10 MED ORDER — CILOSTAZOL 100 MG PO TABS: 100.0000 mg | ORAL_TABLET | Freq: Two times a day (BID) | ORAL | 0 refills | Status: DC

## 2019-12-16 ENCOUNTER — Other Ambulatory Visit: Payer: Self-pay

## 2019-12-16 ENCOUNTER — Ambulatory Visit (INDEPENDENT_AMBULATORY_CARE_PROVIDER_SITE_OTHER): Payer: BC Managed Care – PPO | Admitting: Endocrinology

## 2019-12-16 ENCOUNTER — Encounter: Payer: Self-pay | Admitting: Endocrinology

## 2019-12-16 DIAGNOSIS — E291 Testicular hypofunction: Secondary | ICD-10-CM

## 2019-12-16 DIAGNOSIS — E1169 Type 2 diabetes mellitus with other specified complication: Secondary | ICD-10-CM | POA: Diagnosis not present

## 2019-12-16 DIAGNOSIS — E669 Obesity, unspecified: Secondary | ICD-10-CM | POA: Diagnosis not present

## 2019-12-16 DIAGNOSIS — D751 Secondary polycythemia: Secondary | ICD-10-CM | POA: Diagnosis not present

## 2019-12-16 MED ORDER — CLOMIPHENE CITRATE 50 MG PO TABS: ORAL_TABLET | ORAL | 0 refills | Status: DC

## 2019-12-16 NOTE — Progress Notes (Signed)
Patient ID: Zachary Murray, male   DOB: [birth year], 54 y.o.   MRN: [id number]              Today's office visit was provided via telemedicine using video technique Explained to the patient and the the limitations of evaluation and management by telemedicine and the availability of in person appointments.  The patient understood the limitations and agreed to proceed. Patient also understood that the telehealth visit is billable. . Location of the patient: Home . Location of the provider: Office Only the patient and myself were participating in the encounter   Chief complaint: Endocrinology follow-up  History of Present Illness  PROBLEM 1  Hypogonadismwas first diagnosed in 11/18  Since about 2016 or so he has had complaints offatigue He also feels malaise and listless, mostly in the afternoons and did not feel like doing anything at that time Also has some decreased libido  The diagnosis was confirmed by the low free T level of 6.6 done in 5/19 [facility] was inappropriately low at 6.3  He was on a trial of CLOMIPHENE 25 mg 3 times a week With this his energy level and libido were improving but only partially This is despite his testosterone level being as high as 661, his [place] had gone up to 20  Because of the lack of adequate symptomatic improvement he is taking AndroGel since 9/19 Appears to be requiring relatively large doses of AndroGel up to 6 pumps a day  With testosterone gel he was feeling significantly better with his energy level overall His dose has been adjusted periodic However because of his hemoglobin going up to 18.3 in 03/2019 he was told to reduce the dose to 4 pumps daily  With this he was complaining of feeling fatigued with the lower dose He was taking 5 pumps a day on the AndroGel in November when he was seen but because of fatigue and testosterone level of only 212 he was switched to [place]  He thinks he was taking this regularly but stopped taking 3 to 4 weeks  ago because of cost and his high deductible He does not think he felt any better with this However even though he has been late for his injection his testosterone level is higher at 297  Since he started Mysoline for his tremor he has had much more energy and is able to be more active  His hemoglobin has been as high as 18 recently and he is going for blood donation every 2 months or so which improves his hemoglobin   Lab Results  Component Value Date   TESTOSTERONE 297.00 (L) 12/09/2019   TESTOSTERONE 212.20 (L) 09/21/2019   TESTOSTERONE 348.03 05/10/2019   TESTOSTERONE 372.34 03/29/2019    Prolactin level: 5.8  Lab Results  Component Value Date   LH 20.04 (H) 05/20/2018   Lab Results  Component Value Date   HGB 15.3 12/09/2019     PROBLEM 2: DIABETES:  Diagnosis date: ?  2016  Previous history:  Although he was diagnosed to have diabetes in 2016 he was not able to tolerate metformin that was previously prescribed Also was tried on [place] in 2017 and 2018 with some improved control He was tried on Bydureon in 06/2017 and continued until mil 3/19 d He was started on Ozempic and Tresiba insulin in 01/2018 by his PCP when his A1c was 7.8 and fasting glucose 165 Insulin was stopped in 5/19 since his blood sugar was averaging only 92 on his freestyle  sensor at home  Recent history:     Non-insulin hypoglycemic drugs:   Ozempic 1 mg weekly  His most recent A1c is 5.8, was 6.9  His diabetes was not discussed today He says that he is starting to lose a little weight which he had gained previously in December and is now down to 358       Monitors blood glucose:   Previously with freestyle libre         Blood Glucose readings:  Blood sugar range 89-152 on his fingersticks with average 110 Not using CGM recently   Side effects from medications:  Diarrhea from metformin         Meals:  Usually low-fat                    Dietician visit: Most recent:      Never  Weight control:  Wt Readings from Last 3 Encounters:  06/22/19 (!) 366 lb (166 kg)  06/15/19 (!) 362 lb 9.6 oz (164.5 kg)  06/07/19 (!) 367 lb 6.4 oz (166.7 kg)   Lab Results  Component Value Date   HGBA1C 5.8 09/21/2019   HGBA1C 6.9 (H) 03/29/2019   HGBA1C 6.6 (H) 12/28/2018   Lab Results  Component Value Date   MICROALBUR 3.6 (H) 03/29/2019   LDLCALC 78 12/28/2018   CREATININE 1.04 09/21/2019    HYPOTHYROIDISM:  He was previously given levothyroxine for high TSH in 12/16 but later stopped because of improved TSH  Because of his fatigue he had been started on levothyroxine in 3/19 with only slight improvement in his fatigue   He has been taking 75 mcg of levothyroxine and has been on the same dose since July 2019 Had not had any improvement in his fatigue with increasing the dose  TSH normal as below     Lab Results  Component Value Date   TSH 1.99 09/21/2019   TSH 2.28 03/29/2019   TSH 2.47 07/27/2018   FREET4 1.41 09/21/2019   FREET4 1.07 03/29/2019   FREET4 0.82 07/21/2018        Allergies as of 12/16/2019      Reactions   Metformin And Related Diarrhea   Wellbutrin [bupropion] Other (See Comments)   tremors   Lisinopril Cough      Medication List       Accurate as of December 16, 2019 10:18 AM. If you have any questions, ask your nurse or doctor.        ALPRAZolam 0.5 MG tablet Commonly known as: XANAX TAKE 1 TABLET(0.5 MG) BY MOUTH THREE TIMES DAILY AS NEEDED FOR ANXIETY   cilostazol 100 MG tablet Commonly known as: PLETAL Take 1 tablet (100 mg total) by mouth 2 (two) times daily.   FreeStyle [employer] 1 Act by Does not apply route daily.   FreeStyle [employer] Misc 1 Act by Does not apply route daily.   glucose blood test strip Use onetouch verio flex test strip as instructed to check blood sugar once daily.   Insulin Pen Needle 31G X 6 MM Misc Commonly known as: ReliOn Pen Needles Use as needed for  insulin injection.   levothyroxine 75 MCG tablet Commonly known as: SYNTHROID Take 1 tablet (75 mcg total) by mouth daily.   onetouch ultrasoft lancets Use onetouch delica lancet to check blood sugar once daily.   OneTouch Verio Flex System w/Device Kit Use as instructed to check blood sugar once daily.   Ozempic (1 MG/DOSE)  2 MG/1.5ML Sopn Generic drug: Semaglutide (1 MG/DOSE) Inject 1 mg into the skin every Tuesday.   primidone 50 MG tablet Commonly known as: MYSOLINE Take 1 tablet (50 mg total) by mouth at bedtime.   propranolol 40 MG tablet Commonly known as: INDERAL Take 1 tablet (40 mg total) by mouth 3 (three) times daily.   ST JOHNS WORT PO Take 2 capsules by mouth daily. Should equal '700mg'$  daily per patient   triamterene-hydrochlorothiazide 37.5-25 MG tablet Commonly known as: MAXZIDE-25 TAKE 1 TABLET BY MOUTH DAILY   Vitamin D (Ergocalciferol) 1.25 MG (50000 UNIT) Caps capsule Commonly known as: DRISDOL Take 1 capsule (50,000 Units total) by mouth once a week.   Xyosted 75 MG/0.5ML Soaj Generic drug: Testosterone Enanthate INJECT 75 MG INTO SKIN EVERY WEEK       Allergies:  Allergies  Allergen Reactions  . Metformin And Related Diarrhea  . Wellbutrin [Bupropion] Other (See Comments)    tremors  . Lisinopril Cough    Past Medical History:  Diagnosis Date  . Hypertension   . Obesity   . OSA on CPAP   . Type II diabetes mellitus with manifestations [facility])     Past Surgical History:  Procedure Laterality Date  . BIOPSY  08/31/2018   Procedure: BIOPSY;  Surgeon: Mauri Pole, MD;  Location: [facility];  Service: Endoscopy;;  . COLONOSCOPY WITH PROPOFOL N/A 11/12/2016   Procedure: COLONOSCOPY WITH PROPOFOL;  Surgeon: Mauri Pole, MD;  Location: [facility];  Service: Endoscopy;  Laterality: N/A;  . COLONOSCOPY WITH PROPOFOL N/A 08/31/2018   Procedure: COLONOSCOPY WITH PROPOFOL;  Surgeon: Mauri Pole, MD;  Location: [facility];  Service: Endoscopy;  Laterality: N/A;  . POLYPECTOMY  08/31/2018   Procedure: POLYPECTOMY;  Surgeon: Mauri Pole, MD;  Location: [facility];  Service: Endoscopy;;  . WISDOM TOOTH EXTRACTION      Family History  Problem Relation Age of Onset  . Colon cancer Father   . Hypertension Father   . Thyroid cancer Mother   . Colon polyps Mother   . Thyroid disease Mother   . Thyroid disease Sister   . Thyroid disease Maternal Grandmother   . Alcohol abuse Neg Hx   . COPD Neg Hx   . Diabetes Neg Hx   . Early death Neg Hx   . Hearing loss Neg Hx   . Heart disease Neg Hx   . Hyperlipidemia Neg Hx   . Kidney disease Neg Hx   . Stroke Neg Hx   . Esophageal cancer Neg Hx     Social History:  reports that he has never smoked. His smokeless tobacco use includes chew. He reports that he does not drink alcohol or use drugs.  Review of Systems    LIPIDS: LDL below 100, not on a statin drug Followed by PCP  Lab Results  Component Value Date   CHOL 127 12/28/2018   HDL 38.00 (L) 12/28/2018   LDLCALC 78 12/28/2018   TRIG 55.0 12/28/2018   CHOLHDL 3 12/28/2018     General Examination:   There were no vitals taken for this visit.    Assessment/ Plan:   HYPOGONADOTROPIC HYPOGONADISM with symptoms of fatigue and low free testosterone at baseline  He has been symptomatic with fatigue and decreased libido  He is now off medications since about the beginning of January Although his testosterone level is better with Xyosted injections he has not taken any injections for at least most of this month  Since he does not have any improvement in his energy level with this he does not want to take any medication at this time  Also has had issues with erythrocytosis with hemoglobin as high as 19 and recently up to 18 This improves when he donates blood  Patient is requesting that he try clomiphene although he does not want to start this now Previously with this he had  significantly improved testosterone levels and LH had been up to 20 with the 25 mg 3 times a week   DIABETES with morbid obesity  Recently well controlled at home with normal blood sugars mostly although A1c is not due as yet  FATIGUE: Appears to be better with Mysoline and not clear how this has helped him, he is following up with his neurologist  Elayne Snare 12/16/2019, 10:18 AM      Note: This office note was prepared with Dragon voice recognition system technology. Any transcriptional errors that result from this process are unintentional.     Elayne Snare 12/16/2019, 10:18 AM

## 2019-12-24 ENCOUNTER — Other Ambulatory Visit: Payer: Self-pay

## 2019-12-24 MED ORDER — PROPRANOLOL HCL 40 MG PO TABS: 40.0000 mg | ORAL_TABLET | Freq: Three times a day (TID) | ORAL | 1 refills | Status: DC

## 2019-12-27 ENCOUNTER — Other Ambulatory Visit: Payer: Self-pay

## 2019-12-27 ENCOUNTER — Encounter: Payer: Self-pay | Admitting: Internal Medicine

## 2019-12-27 ENCOUNTER — Ambulatory Visit (INDEPENDENT_AMBULATORY_CARE_PROVIDER_SITE_OTHER): Payer: BC Managed Care – PPO | Admitting: Internal Medicine

## 2019-12-27 VITALS — BP 138/74 | HR 64 | Temp 97.7°F | Resp 16 | Ht 72.0 in | Wt 360.5 lb

## 2019-12-27 DIAGNOSIS — E291 Testicular hypofunction: Secondary | ICD-10-CM

## 2019-12-27 DIAGNOSIS — I10 Essential (primary) hypertension: Secondary | ICD-10-CM | POA: Diagnosis not present

## 2019-12-27 DIAGNOSIS — Z0000 Encounter for general adult medical examination without abnormal findings: Secondary | ICD-10-CM

## 2019-12-27 DIAGNOSIS — E118 Type 2 diabetes mellitus with unspecified complications: Secondary | ICD-10-CM

## 2019-12-27 DIAGNOSIS — L720 Epidermal cyst: Secondary | ICD-10-CM | POA: Insufficient documentation

## 2019-12-27 DIAGNOSIS — B078 Other viral warts: Secondary | ICD-10-CM | POA: Insufficient documentation

## 2019-12-27 DIAGNOSIS — E039 Hypothyroidism, unspecified: Secondary | ICD-10-CM

## 2019-12-27 DIAGNOSIS — E559 Vitamin D deficiency, unspecified: Secondary | ICD-10-CM | POA: Diagnosis not present

## 2019-12-27 DIAGNOSIS — L723 Sebaceous cyst: Secondary | ICD-10-CM

## 2019-12-27 LAB — BASIC METABOLIC PANEL
BUN: 18 mg/dL (ref 6–23)
CO2: 31 mEq/L (ref 19–32)
Calcium: 10.1 mg/dL (ref 8.4–10.5)
Chloride: 102 mEq/L (ref 96–112)
Creatinine, Ser: 0.93 mg/dL (ref 0.40–1.50)
GFR: 84.03 mL/min (ref >60.00)
Glucose, Bld: 110 mg/dL — ABNORMAL HIGH (ref 70–99)
Potassium: 4.7 mEq/L (ref 3.5–5.1)
Sodium: 141 mEq/L (ref 135–145)

## 2019-12-27 LAB — CBC WITH DIFFERENTIAL/PLATELET
Basophils Absolute: 0 10*3/uL (ref 0.0–0.1)
Basophils Relative: 0.5 % (ref 0.0–3.0)
Eosinophils Absolute: 0.2 10*3/uL (ref 0.0–0.7)
Eosinophils Relative: 2.6 % (ref 0.0–5.0)
HCT: 45 % (ref 39.0–52.0)
Hemoglobin: 15.4 g/dL (ref 13.0–17.0)
Lymphocytes Relative: 17.7 % (ref 12.0–46.0)
Lymphs Abs: 1.1 10*3/uL (ref 0.7–4.0)
MCHC: 34.2 g/dL (ref 30.0–36.0)
MCV: 88.5 fl (ref 78.0–100.0)
Monocytes Absolute: 0.7 10*3/uL (ref 0.1–1.0)
Monocytes Relative: 11 % (ref 3.0–12.0)
Neutro Abs: 4.3 10*3/uL (ref 1.4–7.7)
Neutrophils Relative %: 68.2 % (ref 43.0–77.0)
Platelets: 230 10*3/uL (ref 150.0–400.0)
RBC: 5.09 Mil/uL (ref 4.22–5.81)
RDW: 13.5 % (ref 11.5–15.5)
WBC: 6.3 10*3/uL (ref 4.0–10.5)

## 2019-12-27 LAB — HEPATIC FUNCTION PANEL
ALT: 24 U/L (ref 0–53)
AST: 31 U/L (ref 0–37)
Albumin: 4.5 g/dL (ref 3.5–5.2)
Alkaline Phosphatase: 73 U/L (ref 39–117)
Bilirubin, Direct: 0.2 mg/dL (ref 0.0–0.3)
Total Bilirubin: 0.8 mg/dL (ref 0.2–1.2)
Total Protein: 7 g/dL (ref 6.0–8.3)

## 2019-12-27 LAB — LIPID PANEL
Cholesterol: 121 mg/dL (ref 0–200)
HDL: 48.7 mg/dL (ref >39.00)
LDL Cholesterol: 63 mg/dL (ref 0–99)
NonHDL: 72.74
Total CHOL/HDL Ratio: 2
Triglycerides: 48 mg/dL (ref 0.0–149.0)
VLDL: 9.6 mg/dL (ref 0.0–40.0)

## 2019-12-27 LAB — TSH: TSH: 2.43 u[IU]/mL (ref 0.35–4.50)

## 2019-12-27 LAB — PSA: PSA: 0.55 ng/mL (ref 0.10–4.00)

## 2019-12-27 LAB — HEMOGLOBIN A1C: Hgb A1c MFr Bld: 5.9 % (ref 4.6–6.5)

## 2019-12-27 LAB — VITAMIN D 25 HYDROXY (VIT D DEFICIENCY, FRACTURES): VITD: 64.75 ng/mL (ref 30.00–100.00)

## 2019-12-27 NOTE — Progress Notes (Signed)
Subjective:  Patient ID: Zachary Murray, male    DOB: 05-04-1966  Age: 54 y.o. MRN: [id number]  CC: Annual Exam, Hypertension, Hyperlipidemia, and Hypothyroidism  This visit occurred during the SARS-CoV-2 public health emergency.  Safety protocols were in place, including screening questions prior to the visit, additional usage of staff PPE, and extensive cleaning of exam room while observing appropriate contact time as indicated for disinfecting solutions.    [name] presents for a CPX.  He tells me that he is feeling much better since he recently restarted primidone.  He says his fatigue and depression are better.  He is more active telling me that he takes up to 8000 steps per day.  He complains of skin lesions, one in his right groin, and one over the left posterior thigh.  These do not bother him very much.  He continues to be concerned about a cyst on the right side of his face.  He wants to see plastic surgery about this again.  Outpatient Medications Prior to Visit  Medication Sig Dispense Refill  . ALPRAZolam (XANAX) 0.5 MG tablet TAKE 1 TABLET(0.5 MG) BY MOUTH THREE TIMES DAILY AS NEEDED FOR ANXIETY 270 tablet 0  . Blood Glucose Monitoring Suppl ([place]) w/Device KIT Use as instructed to check blood sugar once daily. 1 kit 0  . cilostazol (PLETAL) 100 MG tablet Take 1 tablet (100 mg total) by mouth 2 (two) times daily. 180 tablet 0  . Continuous Blood Gluc Receiver (FREESTYLE LIBRE READER) [place]VI 1 Act by Does not apply route daily. 1 Device 11  . Continuous Blood Gluc Sensor (FREESTYLE LIBRE SENSOR SYSTEM) MISC 1 Act by Does not apply route daily. 9 each 4  . glucose blood test strip Use onetouch verio flex test strip as instructed to check blood sugar once daily. 100 each 3  . Insulin Pen Needle (RELION PEN NEEDLES) 31G X 6 MM MISC Use as needed for insulin injection. 100 each 3  . Lancets (ONETOUCH ULTRASOFT) lancets Use onetouch delica lancet to  check blood sugar once daily. 100 each 3  . levothyroxine (SYNTHROID) 75 MCG tablet Take 1 tablet (75 mcg total) by mouth daily. 90 tablet 3  . primidone (MYSOLINE) 50 MG tablet Take 1 tablet (50 mg total) by mouth at bedtime. 90 tablet 1  . propranolol (INDERAL) 40 MG tablet Take 1 tablet (40 mg total) by mouth 3 (three) times daily. 270 tablet 1  . triamterene-hydrochlorothiazide (MAXZIDE-25) 37.5-25 MG tablet TAKE 1 TABLET BY MOUTH DAILY 90 tablet 0  . Vitamin D, Ergocalciferol, (DRISDOL) 1.25 MG (50000 UNIT) CAPS capsule Take 1 capsule (50,000 Units total) by mouth once a week. 12 capsule 0  . Semaglutide, 1 MG/DOSE, (OZEMPIC, 1 MG/DOSE,) 2 MG/1.5ML SOPN Inject 1 mg into the skin every Tuesday. 12 pen 1  . clomiPHENE (CLOMID) 50 MG tablet Half tablet at bedtime twice a week (Patient not taking: Reported on 12/27/2019) 12 tablet 0  . ST JOHNS WORT PO Take 2 capsules by mouth daily. Should equal '700mg'$  daily per patient    . XYOSTED 75 MG/0.5ML SOAJ INJECT 75 MG INTO SKIN EVERY WEEK 2 mL 0   No facility-administered medications prior to visit.    ROS Review of Systems  Constitutional: Positive for fatigue. Negative for chills, diaphoresis and unexpected weight change.  HENT: Negative.   Eyes: Negative.   Respiratory: Negative for cough, chest tightness, shortness of breath and wheezing.   Cardiovascular: Negative for  chest pain, palpitations and leg swelling.  Gastrointestinal: Negative for abdominal pain, constipation, diarrhea, nausea and vomiting.  Endocrine: Negative.   Genitourinary: Negative.  Negative for difficulty urinating, dysuria, scrotal swelling, testicular pain and urgency.  Musculoskeletal: Negative.  Negative for arthralgias and myalgias.  Skin: Negative.  Negative for color change and pallor.  Neurological: Negative.  Negative for dizziness, weakness, light-headedness, numbness and headaches.  Hematological: Negative for adenopathy. Does not bruise/bleed easily.    Psychiatric/Behavioral: Negative for decreased concentration, dysphoric mood and sleep disturbance. The patient is nervous/anxious.     Objective:  BP 138/74 (BP Location: Left Arm, Patient Position: Sitting, Cuff Size: Large)   Pulse 64   Temp 97.7 F (36.5 C) (Oral)   Resp 16   Ht 6' (1.829 m)   Wt (!) 360 lb 8 oz (163.5 kg)   SpO2 96%   BMI 48.89 kg/m   BP Readings from Last 3 Encounters:  12/27/19 138/74  06/22/19 97/67  06/15/19 107/68    Wt Readings from Last 3 Encounters:  12/27/19 (!) 360 lb 8 oz (163.5 kg)  06/22/19 (!) 366 lb (166 kg)  06/15/19 (!) 362 lb 9.6 oz (164.5 kg)    Physical Exam Vitals reviewed.  Constitutional:      Appearance: He is obese.  HENT:     Nose: Nose normal.     Mouth/Throat:     Mouth: Mucous membranes are moist.  Eyes:     General: No scleral icterus.    Conjunctiva/sclera: Conjunctivae normal.  Cardiovascular:     Rate and Rhythm: Normal rate and regular rhythm.     Pulses: Normal pulses.     Heart sounds: No murmur.     Comments:  EKG -- NSR Nl EKG Pulmonary:     Effort: Pulmonary effort is normal.     Breath sounds: No stridor. No wheezing, rhonchi or rales.  Abdominal:     General: Abdomen is protuberant. Bowel sounds are normal. There is no distension.     Palpations: Abdomen is soft. There is no hepatomegaly, splenomegaly or mass.     Tenderness: There is no abdominal tenderness.     Hernia: No hernia is present. There is no hernia in the left inguinal area or right inguinal area.  Genitourinary:    Pubic Area: No rash.      Penis: Normal.      Testes: Normal.        Right: Mass not present.     Epididymis:     Right: Not inflamed or enlarged.     Left: Not inflamed or enlarged.     Prostate: Normal. Not enlarged, not tender and no nodules present.     Rectum: Normal. Guaiac result negative. No mass, tenderness, anal fissure, external hemorrhoid or internal hemorrhoid. Normal anal tone.     Comments: There is  a small verrucous lesion in the right groin and one over the lest posterior mid-thigh. Musculoskeletal:        General: Normal range of motion.     Cervical back: Neck supple.     Right lower leg: No edema.     Left lower leg: No edema.  Lymphadenopathy:     Cervical: No cervical adenopathy.     Lower Body: No right inguinal adenopathy. No left inguinal adenopathy.  Skin:    General: Skin is warm and dry.     Coloration: Skin is not pale.  Neurological:     General: No focal deficit present.  Mental Status: He is alert and oriented to person, place, and time. Mental status is at baseline.  Psychiatric:        Mood and Affect: Mood normal.        Behavior: Behavior normal.     Lab Results  Component Value Date   WBC 6.3 12/27/2019   HGB 15.4 12/27/2019   HCT 45.0 12/27/2019   PLT 230.0 12/27/2019   GLUCOSE 110 (H) 12/27/2019   CHOL 121 12/27/2019   TRIG 48.0 12/27/2019   HDL 48.70 12/27/2019   LDLCALC 63 12/27/2019   ALT 24 12/27/2019   AST 31 12/27/2019   NA 141 12/27/2019   K 4.7 12/27/2019   CL 102 12/27/2019   CREATININE 0.93 12/27/2019   BUN 18 12/27/2019   CO2 31 12/27/2019   TSH 2.43 12/27/2019   PSA 0.55 12/27/2019   HGBA1C 5.9 12/27/2019   MICROALBUR 3.6 (H) 03/29/2019    No results found.  Assessment & Plan:   Baylon was seen today for annual exam, hypertension, hyperlipidemia and hypothyroidism.  Diagnoses and all orders for this visit:  Routine general medical examination at a health care facility- Exam, labs reviewed, vaccines reviewed, colon cancer screening is up-to-date, patient education was given. -     Lipid panel -     PSA  Type II diabetes mellitus with manifestations ([place])- His blood sugars are adequately well controlled. -     Basic metabolic panel -     Hemoglobin A1c -     Hepatic function panel  Acquired hypothyroidism- His TSH is in the normal range.  He will remain on the current dose of levothyroxine. -     TSH  Essential  hypertension, benign- His blood pressure is well controlled.  EKG is negative for LVH or ischemia.  Electrolytes and renal function are normal. -     CBC with Differential/Platelet -     Basic metabolic panel -     EKG 37-OUZH  Hypogonadism male  Vitamin D deficiency -     VITAMIN D 25 Hydroxy (Vit-D Deficiency, Fractures)  Inflamed sebaceous cyst  Epidermal cyst of face -     Ambulatory referral to Plastic Surgery   I have discontinued Aaron Mose. Spielberg "Ken"'s ST JOHNS WORT PO, Ozempic (1 MG/DOSE), Xyosted, and clomiPHENE. I am also having him maintain his FreeStyle Libre Reader, Insulin Pen Needle, FreeStyle [employer], onetouch ultrasoft, glucose blood, OneTouch Verio Flex System, levothyroxine, ALPRAZolam, triamterene-hydrochlorothiazide, primidone, Vitamin D (Ergocalciferol), cilostazol, and propranolol.  No orders of the defined types were placed in this encounter.    Follow-up: Return in about 4 weeks (around 01/24/2020).  Scarlette Calico, MD

## 2019-12-27 NOTE — Patient Instructions (Signed)

## 2019-12-29 ENCOUNTER — Ambulatory Visit (INDEPENDENT_AMBULATORY_CARE_PROVIDER_SITE_OTHER): Payer: BC Managed Care – PPO | Admitting: Internal Medicine

## 2019-12-29 ENCOUNTER — Other Ambulatory Visit: Payer: Self-pay | Admitting: Internal Medicine

## 2019-12-29 ENCOUNTER — Encounter: Payer: Self-pay | Admitting: Internal Medicine

## 2019-12-29 ENCOUNTER — Other Ambulatory Visit: Payer: Self-pay

## 2019-12-29 VITALS — BP 134/70 | HR 78 | Temp 98.3°F | Resp 16 | Ht 72.0 in | Wt 360.3 lb

## 2019-12-29 DIAGNOSIS — Z23 Encounter for immunization: Secondary | ICD-10-CM | POA: Diagnosis not present

## 2019-12-29 DIAGNOSIS — L989 Disorder of the skin and subcutaneous tissue, unspecified: Secondary | ICD-10-CM

## 2019-12-29 DIAGNOSIS — A630 Anogenital (venereal) warts: Secondary | ICD-10-CM | POA: Diagnosis not present

## 2019-12-29 DIAGNOSIS — D1801 Hemangioma of skin and subcutaneous tissue: Secondary | ICD-10-CM | POA: Diagnosis not present

## 2019-12-29 NOTE — Patient Instructions (Signed)
Skin Biopsy A skin biopsy is a procedure to remove a sample of your skin (lesion) so that it can be checked under a microscope. You may need a skin biopsy if you have a skin disease or abnormal changes in your skin. Tell a health care provider about:  Any allergies you have.  All medicines you are taking, including vitamins, herbs, eye drops, creams, and over-the-counter medicines.  Any problems you or family members have had with anesthetic medicines.  Any blood disorders you have.  Any surgeries you have had.  Any medical conditions you have or have had.  Whether you are pregnant or may be pregnant. What are the risks? Generally, this is a safe procedure. However, problems may occur, including:  Infection.  Bleeding.  Allergic reaction to medicines.  Scarring. What happens before the procedure?  Ask your health care provider about: ? Changing or stopping your regular medicines. This is especially important if you are taking blood thinners. ? Taking medicines such as aspirin and ibuprofen. These medicines can thin your blood. Do not take these medicines unless your health care provider tells you to take them. ? Taking over-the-counter medicines, vitamins, herbs, and supplements.  Ask your health care provider if you will need someone to take you home from the hospital or clinic after the procedure.  Ask your health care provider how your biopsy site will be marked or identified.  Ask your health care provider what steps will be taken to help prevent infection. These may include: ? Removing hair at the surgery site. ? Washing skin with a germ-killing soap. ? Taking antibiotic medicine. What happens during the procedure?   You may be given medicine to numb the area (local anesthetic).  Your health care provider will take a sample using one of these steps, depending on the type of skin problem that you have: ? Shave biopsy. Your health care provider will shave away  layers of your skin lesion with a sharp blade. After shaving, a gel or ointment may be used to control bleeding. ? Punch biopsy. Your health care provider will use a tool to remove all or part of the lesion. This leaves a small hole about the width of a pencil eraser. The area may be covered with a gel or ointment. ? Excisional or incisional biopsy. Your health care provider will use a surgical blade to remove all or part of your lesion.  Your skin biopsy site may be closed with stitches (sutures).  A bandage (dressing) will be applied. The procedure may vary among health care providers and hospitals. What happens after the procedure?  Your skin sample will be sent to a lab for tests.  Your skin biopsy site will be watched to make sure that it stops bleeding.  You will be given instructions on how to care for your biopsy site.  It is up to you to get the results of your procedure. Ask your health care provider, or the department that is doing the procedure, when your results will be ready. Summary  A skin biopsy is a procedure to remove a sample of your skin (lesion) so that it can be checked under a microscope.  Tell a health care provider about your medical history and all medicines you are taking, including vitamins, herbs, eye drops, creams, and over-the-counter medicines.  Before the procedure, ask your health care provider about changing or stopping your regular medicines.  During the procedure, your health care provider will take a skin sample from  the area where you have the skin problem.  After the procedure, your skin sample will be sent to a laboratory for testing. This information is not intended to replace advice given to you by your health care provider. Make sure you discuss any questions you have with your health care provider. Document Revised: 05/04/2018 Document Reviewed: 05/04/2018 Elsevier Patient Education  2020 [employer]  

## 2019-12-29 NOTE — Progress Notes (Addendum)
Subjective:  Patient ID: Zachary Murray, male    DOB: 10/31/1966  Age: 54 y.o. MRN: [id number]  CC: Follow-up  This visit occurred during the SARS-CoV-2 public health emergency.  Safety protocols were in place, including screening questions prior to the visit, additional usage of staff PPE, and extensive cleaning of exam room while observing appropriate contact time as indicated for disinfecting solutions.    [name] presents for concerns about 2 skin lesions.  One is over his left posterior thigh.  He thinks has been present for several months.  He says the lesion sometimes feels irritated.  He also has a lesion in his right groin that is asymptomatic.  Outpatient Medications Prior to Visit  Medication Sig Dispense Refill  . ALPRAZolam (XANAX) 0.5 MG tablet TAKE 1 TABLET(0.5 MG) BY MOUTH THREE TIMES DAILY AS NEEDED FOR ANXIETY 270 tablet 0  . Blood Glucose Monitoring Suppl ([place]) w/Device KIT Use as instructed to check blood sugar once daily. 1 kit 0  . cilostazol (PLETAL) 100 MG tablet Take 1 tablet (100 mg total) by mouth 2 (two) times daily. 180 tablet 0  . Continuous Blood Gluc Receiver (FREESTYLE LIBRE READER) DEVI 1 Act by Does not apply route daily. 1 Device 11  . Continuous Blood Gluc Sensor (FREESTYLE LIBRE SENSOR SYSTEM) MISC 1 Act by Does not apply route daily. 9 each 4  . glucose blood test strip Use onetouch verio flex test strip as instructed to check blood sugar once daily. 100 each 3  . Insulin Pen Needle (RELION PEN NEEDLES) 31G X 6 MM MISC Use as needed for insulin injection. 100 each 3  . Lancets (ONETOUCH ULTRASOFT) lancets Use onetouch delica lancet to check blood sugar once daily. 100 each 3  . levothyroxine (SYNTHROID) 75 MCG tablet Take 1 tablet (75 mcg total) by mouth daily. 90 tablet 3  . primidone (MYSOLINE) 50 MG tablet Take 1 tablet (50 mg total) by mouth at bedtime. 90 tablet 1  . propranolol (INDERAL) 40 MG tablet Take 1 tablet (40  mg total) by mouth 3 (three) times daily. 270 tablet 1  . triamterene-hydrochlorothiazide (MAXZIDE-25) 37.5-25 MG tablet TAKE 1 TABLET BY MOUTH DAILY 90 tablet 0  . Vitamin D, Ergocalciferol, (DRISDOL) 1.25 MG (50000 UNIT) CAPS capsule Take 1 capsule (50,000 Units total) by mouth once a week. 12 capsule 0   No facility-administered medications prior to visit.    ROS Review of Systems  All other systems reviewed and are negative.   Objective:  BP 134/70 (BP Location: Right Arm, Patient Position: Sitting, Cuff Size: Large)   Pulse 78   Temp 98.3 F (36.8 C) (Oral)   Resp 16   Ht 6' (1.829 m)   Wt (!) 360 lb 5 oz (163.4 kg)   SpO2 96%   BMI 48.87 kg/m   BP Readings from Last 3 Encounters:  12/29/19 134/70  12/27/19 138/74  06/22/19 97/67    Wt Readings from Last 3 Encounters:  12/29/19 (!) 360 lb 5 oz (163.4 kg)  12/27/19 (!) 360 lb 8 oz (163.5 kg)  06/22/19 (!) 366 lb (166 kg)    Physical Exam Genitourinary:   Musculoskeletal:       Legs:     Lab Results  Component Value Date   WBC 6.3 12/27/2019   HGB 15.4 12/27/2019   HCT 45.0 12/27/2019   PLT 230.0 12/27/2019   GLUCOSE 110 (H) 12/27/2019   CHOL 121 12/27/2019  TRIG 48.0 12/27/2019   HDL 48.70 12/27/2019   LDLCALC 63 12/27/2019   ALT 24 12/27/2019   AST 31 12/27/2019   NA 141 12/27/2019   K 4.7 12/27/2019   CL 102 12/27/2019   CREATININE 0.93 12/27/2019   BUN 18 12/27/2019   CO2 31 12/27/2019   TSH 2.43 12/27/2019   PSA 0.55 12/27/2019   HGBA1C 5.9 12/27/2019   MICROALBUR 3.6 (H) 03/29/2019   Liquid nitrogen was applied for 10-12 seconds to the skin lesion and the expected blistering or scabbing reaction explained. Patient reminded to expect hypopigmented scars from the procedure. Return if lesions fail to fully resolve.   After informed verbal consent was obtained, using Betadine for cleansing and 1% Lidocaine with epinephrine for anesthetic (2 cc's were used), a shave excision biopsy was  used to obtain a biopsy specimen of the lesion. Hemostasis was obtained by pressure. Antibiotic dressing is applied, and wound care instructions provided. The specimen is labeled and sent to pathology for evaluation. The procedure was well tolerated without complications.  Assessment & Plan:   Zachary Murray was seen today for follow-up.  Diagnoses and all orders for this visit:  Skin lesion- The biopsy shows verrucous hyperplasia of the epidermis that has been traumatized.  No malignancy was identified. -     Surgical pathology( [place]/ [facility]); Future -     Surgical pathology( [place]/ [facility])  Need for HPV vaccination -     HPV 9-valent vaccine,Recombinat  Genital warts- This was treated with cryotherapy.  He also received his first dose of the HPV vaccine.  I have asked him return in 3 to 4 weeks to retreat the area if indicated and to receive his second dose of the HPV vaccine.   I am having Zachary Murray "Zachary Murray" maintain his [employer] Reader, Insulin Pen Needle, FreeStyle [employer], onetouch ultrasoft, glucose blood, [employer], levothyroxine, ALPRAZolam, triamterene-hydrochlorothiazide, primidone, Vitamin D (Ergocalciferol), cilostazol, and propranolol.  No orders of the defined types were placed in this encounter.    Follow-up: Return in about 4 weeks (around 01/26/2020).  Zachary Calico, MD

## 2020-01-03 ENCOUNTER — Encounter: Payer: Self-pay | Admitting: Internal Medicine

## 2020-01-03 ENCOUNTER — Other Ambulatory Visit: Payer: Self-pay | Admitting: Internal Medicine

## 2020-01-03 DIAGNOSIS — L989 Disorder of the skin and subcutaneous tissue, unspecified: Secondary | ICD-10-CM | POA: Insufficient documentation

## 2020-01-03 DIAGNOSIS — A630 Anogenital (venereal) warts: Secondary | ICD-10-CM | POA: Insufficient documentation

## 2020-01-03 DIAGNOSIS — F411 Generalized anxiety disorder: Secondary | ICD-10-CM

## 2020-01-03 MED ORDER — ALPRAZOLAM 0.5 MG PO TABS: ORAL_TABLET | ORAL | 0 refills | Status: DC

## 2020-01-08 ENCOUNTER — Other Ambulatory Visit: Payer: Self-pay | Admitting: Internal Medicine

## 2020-01-08 DIAGNOSIS — I10 Essential (primary) hypertension: Secondary | ICD-10-CM

## 2020-01-11 ENCOUNTER — Telehealth: Payer: Self-pay

## 2020-01-11 NOTE — Telephone Encounter (Signed)

## 2020-01-12 ENCOUNTER — Ambulatory Visit: Payer: BC Managed Care – PPO | Admitting: Plastic Surgery

## 2020-01-12 ENCOUNTER — Encounter: Payer: Self-pay | Admitting: Plastic Surgery

## 2020-01-12 ENCOUNTER — Other Ambulatory Visit: Payer: Self-pay

## 2020-01-12 VITALS — BP 122/84 | HR 97 | Temp 97.1°F | Ht 72.0 in | Wt 360.4 lb

## 2020-01-12 DIAGNOSIS — L723 Sebaceous cyst: Secondary | ICD-10-CM

## 2020-01-12 NOTE — Progress Notes (Signed)
Referring Provider [name], MD [address] [place],  [place] [address]   CC:  Chief Complaint  Patient presents with  . Advice Only    Patient here for consultation for epidermal cyst on the face      Zachary Murray is an 54 y.o. male.  HPI: Patient is here for evaluation of recurrent sebaceous cyst in the right cheek.  He says is been excised at least twice before.  Most recently this was excised in July with the pathology showing benign epidermal inclusion cyst.  It has recurred and is bothering him.  He cut it while shaving today because of its prominence.  He would like it to be excised again.  Allergies  Allergen Reactions  . Metformin And Related Diarrhea  . Wellbutrin [Bupropion] Other (See Comments)    tremors  . Lisinopril Cough    Outpatient Encounter Medications as of 01/12/2020  Medication Sig  . ALPRAZolam (XANAX) 0.5 MG tablet TAKE 1 TABLET(0.5 MG) BY MOUTH THREE TIMES DAILY AS NEEDED FOR ANXIETY  . Blood Glucose Monitoring Suppl ([place]) w/Device KIT Use as instructed to check blood sugar once daily.  . cilostazol (PLETAL) 100 MG tablet Take 1 tablet (100 mg total) by mouth 2 (two) times daily.  . Continuous Blood Gluc Sensor (FREESTYLE LIBRE SENSOR SYSTEM) MISC 1 Act by Does not apply route daily.  Marland Kitchen glucose blood test strip Use onetouch verio flex test strip as instructed to check blood sugar once daily.  . Insulin Pen Needle (RELION PEN NEEDLES) 31G X 6 MM MISC Use as needed for insulin injection.  . Lancets (ONETOUCH ULTRASOFT) lancets Use onetouch delica lancet to check blood sugar once daily.  Marland Kitchen levothyroxine (SYNTHROID) 75 MCG tablet Take 1 tablet (75 mcg total) by mouth daily.  . primidone (MYSOLINE) 50 MG tablet Take 1 tablet (50 mg total) by mouth at bedtime.  . propranolol (INDERAL) 40 MG tablet Take 1 tablet (40 mg total) by mouth 3 (three) times daily.  Marland Kitchen triamterene-hydrochlorothiazide (MAXZIDE-25) 37.5-25 MG tablet TAKE  1 TABLET BY MOUTH DAILY  . Vitamin D, Ergocalciferol, (DRISDOL) 1.25 MG (50000 UNIT) CAPS capsule Take 1 capsule (50,000 Units total) by mouth once a week.  . [DISCONTINUED] Continuous Blood Gluc Receiver (FREESTYLE LIBRE READER) DEVI 1 Act by Does not apply route daily.  . [DISCONTINUED] triamterene-hydrochlorothiazide (MAXZIDE-25) 37.5-25 MG tablet TAKE 1 TABLET BY MOUTH DAILY   No facility-administered encounter medications on file as of 01/12/2020.     Past Medical History:  Diagnosis Date  . Hypertension   . Obesity   . OSA on CPAP   . Type II diabetes mellitus with manifestations [facility])     Past Surgical History:  Procedure Laterality Date  . BIOPSY  08/31/2018   Procedure: BIOPSY;  Surgeon: Mauri Pole, MD;  Location: [facility]COPY;  Service: Endoscopy;;  . COLONOSCOPY WITH PROPOFOL N/A 11/12/2016   Procedure: COLONOSCOPY WITH PROPOFOL;  Surgeon: Mauri Pole, MD;  Location: [facility];  Service: Endoscopy;  Laterality: N/A;  . COLONOSCOPY WITH PROPOFOL N/A 08/31/2018   Procedure: COLONOSCOPY WITH PROPOFOL;  Surgeon: Mauri Pole, MD;  Location: [facility];  Service: Endoscopy;  Laterality: N/A;  . POLYPECTOMY  08/31/2018   Procedure: POLYPECTOMY;  Surgeon: Mauri Pole, MD;  Location: [facility];  Service: Endoscopy;;  . WISDOM TOOTH EXTRACTION      Family History  Problem Relation Age of Onset  . Colon cancer Father   .  Hypertension Father   . Thyroid cancer Mother   . Colon polyps Mother   . Thyroid disease Mother   . Thyroid disease Sister   . Thyroid disease Maternal Grandmother   . Alcohol abuse Neg Hx   . COPD Neg Hx   . Diabetes Neg Hx   . Early death Neg Hx   . Hearing loss Neg Hx   . Heart disease Neg Hx   . Hyperlipidemia Neg Hx   . Kidney disease Neg Hx   . Stroke Neg Hx   . Esophageal cancer Neg Hx     Social History   Social History Narrative  . Not on file     Review of Systems General: Denies fevers,  chills, weight loss CV: Denies chest pain, shortness of breath, palpitations  Physical Exam Vitals with BMI 01/12/2020 12/29/2019 12/27/2019  Height '6\' 0"'$  '6\' 0"'$  '6\' 0"'$   Weight 360 lbs 6 oz 360 lbs 5 oz 360 lbs 8 oz  BMI 48.87 02.11 15.52  Systolic 080 223 361  Diastolic 84 70 74  Pulse 97 78 64    General:  No acute distress,  Alert and oriented, Non-Toxic, Normal speech and affect HEENT: Normocephalic atraumatic.  Extraocular is intact.  Cranial grossly intact.  He has a 1.5 to 2 cm cystic lesion with a small overlying scar in the right cheek just lateral to the nasolabial fold.  Assessment/Plan Patient presents with a recurrent epidermal inclusion cyst of the right cheek.  Recommended reexcision with slightly larger margins.  We could do this in the office under local.  I discussed the risk include bleeding, infection, damage surrounding structures, need for additional procedures.  I explained is possible for the lesion to recur again but I think unlikely with a larger excision.  He is fully understanding wants to proceed we will get this scheduled.  Cindra Presume 01/12/2020, 8:53 AM

## 2020-01-27 ENCOUNTER — Other Ambulatory Visit: Payer: Self-pay

## 2020-01-27 ENCOUNTER — Ambulatory Visit: Payer: BC Managed Care – PPO | Admitting: Plastic Surgery

## 2020-01-27 ENCOUNTER — Encounter: Payer: Self-pay | Admitting: Plastic Surgery

## 2020-01-27 ENCOUNTER — Other Ambulatory Visit (HOSPITAL_COMMUNITY)
Admission: RE | Admit: 2020-01-27 | Discharge: 2020-01-27 | Disposition: A | Discharge status: Home / Self Care | Payer: BC Managed Care – PPO | Source: Ambulatory Visit | Attending: Plastic Surgery | Admitting: Plastic Surgery

## 2020-01-27 ENCOUNTER — Encounter: Payer: Self-pay | Admitting: Internal Medicine

## 2020-01-27 VITALS — BP 125/76 | HR 73 | Temp 97.8°F | Ht 72.0 in | Wt 355.8 lb

## 2020-01-27 DIAGNOSIS — L723 Sebaceous cyst: Secondary | ICD-10-CM | POA: Insufficient documentation

## 2020-01-27 DIAGNOSIS — L720 Epidermal cyst: Secondary | ICD-10-CM | POA: Diagnosis not present

## 2020-01-27 NOTE — Progress Notes (Signed)
Operative Note   DATE OF OPERATION: 01/27/2020  LOCATION:    SURGICAL DEPARTMENT: Plastic Surgery  PREOPERATIVE DIAGNOSES: Right cheek cyst  POSTOPERATIVE DIAGNOSES:  same  PROCEDURE:  1. Excision of right cheek cyst measuring 3 cm 2. Complex closure measuring 3 cm  SURGEON: Talmadge Coventry, MD  ANESTHESIA:  Local  COMPLICATIONS: None.   INDICATIONS FOR PROCEDURE:  The patient, Zachary Murray is a 54 y.o. male born on 1966-02-20, is here for treatment of right cheek cyst that has recurred at least twice after previous excisions. MRN: [id number]  CONSENT:  Informed consent was obtained directly from the patient. Risks, benefits and alternatives were fully discussed. Specific risks including but not limited to bleeding, infection, hematoma, seroma, scarring, pain, infection, wound healing problems, and need for further surgery were all discussed. The patient did have an ample opportunity to have questions answered to satisfaction.   DESCRIPTION OF PROCEDURE:  Local anesthesia was administered. The patient's operative site was prepped and draped in a sterile fashion. A time out was performed and all information was confirmed to be correct.  The lesion was excised with a 15 blade.  Hemostasis was obtained.  Circumferential undermining was performed and the skin was advanced and closed in layers with interrupted buried Monocryl sutures and 5-0 fast gut for the skin.  The lesion excised measured 3 cm, and the total length of closure measured 3 cm.    The patient tolerated the procedure well.  There were no complications.

## 2020-01-31 LAB — SURGICAL PATHOLOGY

## 2020-02-01 ENCOUNTER — Ambulatory Visit: Payer: BC Managed Care – PPO | Admitting: Internal Medicine

## 2020-02-07 ENCOUNTER — Encounter: Payer: Self-pay | Admitting: Internal Medicine

## 2020-02-10 ENCOUNTER — Other Ambulatory Visit: Payer: Self-pay

## 2020-02-10 ENCOUNTER — Encounter: Payer: Self-pay | Admitting: Plastic Surgery

## 2020-02-10 ENCOUNTER — Ambulatory Visit: Payer: BC Managed Care – PPO | Admitting: Plastic Surgery

## 2020-02-10 VITALS — BP 114/77 | HR 70 | Temp 97.5°F | Ht 72.0 in | Wt 356.0 lb

## 2020-02-10 DIAGNOSIS — L723 Sebaceous cyst: Secondary | ICD-10-CM

## 2020-02-10 NOTE — Progress Notes (Signed)
Patient is 2 weeks postop from excision of a right cheek cyst with closure.  Pathology showed a benign epidermal inclusion cyst.  He is very happy with how he is healing and has no complaints.  On exam it is his incision is healed fine with no issues.  I will plan to see him again as needed.  We discussed management of the scar going forward.

## 2020-02-15 ENCOUNTER — Ambulatory Visit: Payer: BC Managed Care – PPO | Admitting: Internal Medicine

## 2020-02-28 ENCOUNTER — Other Ambulatory Visit: Payer: Self-pay

## 2020-02-28 ENCOUNTER — Ambulatory Visit: Payer: BC Managed Care – PPO

## 2020-02-28 ENCOUNTER — Ambulatory Visit (INDEPENDENT_AMBULATORY_CARE_PROVIDER_SITE_OTHER): Payer: BC Managed Care – PPO | Admitting: Internal Medicine

## 2020-02-28 ENCOUNTER — Encounter: Payer: Self-pay | Admitting: Internal Medicine

## 2020-02-28 VITALS — BP 126/78 | HR 67 | Temp 98.3°F | Resp 16 | Ht 72.0 in | Wt 354.0 lb

## 2020-02-28 DIAGNOSIS — Z23 Encounter for immunization: Secondary | ICD-10-CM | POA: Diagnosis not present

## 2020-02-28 DIAGNOSIS — M25542 Pain in joints of left hand: Secondary | ICD-10-CM

## 2020-02-28 DIAGNOSIS — E118 Type 2 diabetes mellitus with unspecified complications: Secondary | ICD-10-CM | POA: Diagnosis not present

## 2020-02-28 DIAGNOSIS — I10 Essential (primary) hypertension: Secondary | ICD-10-CM | POA: Diagnosis not present

## 2020-02-28 NOTE — Progress Notes (Signed)
Subjective:  Patient ID: Zachary Murray, male    DOB: December 10, 1965  Age: 54 y.o. MRN: [id number]  CC: Hypertension and Hypothyroidism  This visit occurred during the SARS-CoV-2 public health emergency.  Safety protocols were in place, including screening questions prior to the visit, additional usage of staff PPE, and extensive cleaning of exam room while observing appropriate contact time as indicated for disinfecting solutions.    [name] presents for f/up - He complains of for the last few weeks he has developed discomfort in his left hand and forearm with some numbness and tingling in the left hand.  He believes he has carpal tunnel syndrome.  He otherwise feels well and offers no complaints.  Outpatient Medications Prior to Visit  Medication Sig Dispense Refill  . ALPRAZolam (XANAX) 0.5 MG tablet TAKE 1 TABLET(0.5 MG) BY MOUTH THREE TIMES DAILY AS NEEDED FOR ANXIETY 270 tablet 0  . Barberry-Oreg Grape-Goldenseal (BERBERINE COMPLEX PO)     . Blood Glucose Monitoring Suppl ([place]) w/Device KIT Use as instructed to check blood sugar once daily. 1 kit 0  . cilostazol (PLETAL) 100 MG tablet Take 1 tablet (100 mg total) by mouth 2 (two) times daily. 180 tablet 0  . clomiPHENE (CLOMID) 50 MG tablet     . Continuous Blood Gluc Sensor (FREESTYLE LIBRE SENSOR SYSTEM) MISC 1 Act by Does not apply route daily. 9 each 4  . Glucosamine-Chondroit-Vit C-Mn (GLUCOSAMINE 1500 COMPLEX PO)     . glucose blood test strip Use onetouch verio flex test strip as instructed to check blood sugar once daily. 100 each 3  . Insulin Pen Needle (RELION PEN NEEDLES) 31G X 6 MM MISC Use as needed for insulin injection. 100 each 3  . Lancets (ONETOUCH ULTRASOFT) lancets Use onetouch delica lancet to check blood sugar once daily. 100 each 3  . levothyroxine (SYNTHROID) 75 MCG tablet Take 1 tablet (75 mcg total) by mouth daily. 90 tablet 3  . primidone (MYSOLINE) 50 MG tablet Take 1 tablet (50 mg  total) by mouth at bedtime. 90 tablet 1  . propranolol (INDERAL) 40 MG tablet Take 1 tablet (40 mg total) by mouth 3 (three) times daily. 270 tablet 1  . triamterene-hydrochlorothiazide (MAXZIDE-25) 37.5-25 MG tablet TAKE 1 TABLET BY MOUTH DAILY 90 tablet 0  . Vitamin D, Ergocalciferol, (DRISDOL) 1.25 MG (50000 UNIT) CAPS capsule Take 1 capsule (50,000 Units total) by mouth once a week. 12 capsule 0   No facility-administered medications prior to visit.    ROS Review of Systems  Constitutional: Negative.  Negative for appetite change, diaphoresis, fatigue and unexpected weight change.  HENT: Negative.   Eyes: Negative for visual disturbance.  Respiratory: Negative for cough, chest tightness, shortness of breath and wheezing.   Cardiovascular: Negative for chest pain, palpitations and leg swelling.  Gastrointestinal: Negative for abdominal pain and constipation.  Endocrine: Negative.   Genitourinary: Negative.  Negative for difficulty urinating.  Musculoskeletal: Positive for arthralgias. Negative for back pain, myalgias and neck pain.  Skin: Negative.  Negative for pallor.  Neurological: Positive for numbness. Negative for dizziness and weakness.    Objective:  BP 126/78 (BP Location: Left Arm, Patient Position: Sitting, Cuff Size: Large)   Pulse 67   Temp 98.3 F (36.8 C) (Oral)   Resp 16   Ht 6' (1.829 m)   Wt (!) 354 lb (160.6 kg)   SpO2 97%   BMI 48.01 kg/m   BP Readings from Last 3  Encounters:  02/28/20 126/78  02/10/20 114/77  01/27/20 125/76    Wt Readings from Last 3 Encounters:  02/28/20 (!) 354 lb (160.6 kg)  02/10/20 (!) 356 lb (161.5 kg)  01/27/20 (!) 355 lb 12.8 oz (161.4 kg)    Physical Exam Vitals reviewed.  HENT:     Nose: Nose normal.     Mouth/Throat:     Mouth: Mucous membranes are moist.  Eyes:     General: No scleral icterus.    Conjunctiva/sclera: Conjunctivae normal.  Cardiovascular:     Rate and Rhythm: Normal rate and regular rhythm.      Heart sounds: No murmur.  Pulmonary:     Effort: Pulmonary effort is normal.     Breath sounds: No stridor. No wheezing, rhonchi or rales.  Abdominal:     General: Abdomen is protuberant. Bowel sounds are normal. There is no distension.     Palpations: Abdomen is soft. There is no hepatomegaly, splenomegaly or mass.  Musculoskeletal:        General: No tenderness. Normal range of motion.     Left forearm: Normal. No swelling, edema, deformity, tenderness or bony tenderness.     Left wrist: Normal. No deformity, tenderness or bony tenderness. Normal range of motion.     Left hand: Normal. No swelling, deformity, tenderness or bony tenderness. Normal range of motion. Normal strength. Normal sensation. Normal pulse.     Cervical back: Neck supple.     Right lower leg: No edema.     Left lower leg: No edema.  Lymphadenopathy:     Cervical: No cervical adenopathy.  Skin:    General: Skin is warm and dry.  Neurological:     General: No focal deficit present.     Mental Status: He is oriented to person, place, and time.     Sensory: No sensory deficit.     Motor: Tremor present. No weakness, atrophy or abnormal muscle tone.     Coordination: Coordination is intact.  Psychiatric:        Mood and Affect: Mood normal.     Lab Results  Component Value Date   WBC 6.3 12/27/2019   HGB 15.4 12/27/2019   HCT 45.0 12/27/2019   PLT 230.0 12/27/2019   GLUCOSE 110 (H) 12/27/2019   CHOL 121 12/27/2019   TRIG 48.0 12/27/2019   HDL 48.70 12/27/2019   LDLCALC 63 12/27/2019   ALT 24 12/27/2019   AST 31 12/27/2019   NA 141 12/27/2019   K 4.7 12/27/2019   CL 102 12/27/2019   CREATININE 0.93 12/27/2019   BUN 18 12/27/2019   CO2 31 12/27/2019   TSH 2.43 12/27/2019   PSA 0.55 12/27/2019   HGBA1C 5.9 12/27/2019   MICROALBUR 3.6 (H) 03/29/2019    No results found.  Assessment & Plan:   Zachary Murray was seen today for hypertension and hypothyroidism.  Diagnoses and all orders for this  visit:  Type II diabetes mellitus with manifestations ([place])- His blood sugar is very well controlled.  Need for HPV vaccination -     HPV 9-valent vaccine,Recombinat  Essential hypertension, benign- His blood pressure is adequately well controlled.  Pain in joint of left hand- I agree with him that this is likely carpal tunnel syndrome - The examination of the area is normal.  He is not willing to undergo an electrical study at this time.  He will let me know if and when he changes his mind.   I am having Aaron Mose.  Mcgivern "Yvone Neu" maintain his Insulin Pen Needle, FreeStyle [employer], onetouch ultrasoft, glucose blood, OneTouch Verio Flex System, levothyroxine, primidone, cilostazol, propranolol, ALPRAZolam, triamterene-hydrochlorothiazide, clomiPHENE, Barberry-Oreg Grape-Goldenseal (BERBERINE COMPLEX PO), and Glucosamine-Chondroit-Vit C-Mn (GLUCOSAMINE 1500 COMPLEX PO).  No orders of the defined types were placed in this encounter.    Follow-up: Return in about 6 months (around 08/29/2020).  Scarlette Calico, MD

## 2020-02-28 NOTE — Patient Instructions (Signed)

## 2020-02-29 ENCOUNTER — Other Ambulatory Visit: Payer: Self-pay | Admitting: Family Medicine

## 2020-03-03 DIAGNOSIS — M25542 Pain in joints of left hand: Secondary | ICD-10-CM | POA: Insufficient documentation

## 2020-03-06 ENCOUNTER — Other Ambulatory Visit: Payer: Self-pay | Admitting: Family Medicine

## 2020-03-14 ENCOUNTER — Other Ambulatory Visit: Payer: Self-pay

## 2020-03-14 ENCOUNTER — Other Ambulatory Visit (INDEPENDENT_AMBULATORY_CARE_PROVIDER_SITE_OTHER): Payer: BC Managed Care – PPO

## 2020-03-14 DIAGNOSIS — E291 Testicular hypofunction: Secondary | ICD-10-CM

## 2020-03-14 DIAGNOSIS — E669 Obesity, unspecified: Secondary | ICD-10-CM | POA: Diagnosis not present

## 2020-03-14 DIAGNOSIS — D751 Secondary polycythemia: Secondary | ICD-10-CM | POA: Diagnosis not present

## 2020-03-14 DIAGNOSIS — E1169 Type 2 diabetes mellitus with other specified complication: Secondary | ICD-10-CM

## 2020-03-14 LAB — CBC
HCT: 40.2 % (ref 39.0–52.0)
Hemoglobin: 13.8 g/dL (ref 13.0–17.0)
MCHC: 34.3 g/dL (ref 30.0–36.0)
MCV: 88.4 fl (ref 78.0–100.0)
Platelets: 220 10*3/uL (ref 150.0–400.0)
RBC: 4.55 Mil/uL (ref 4.22–5.81)
RDW: 14.2 % (ref 11.5–15.5)
WBC: 6.2 10*3/uL (ref 4.0–10.5)

## 2020-03-14 LAB — COMPREHENSIVE METABOLIC PANEL
ALT: 17 U/L (ref 0–53)
AST: 21 U/L (ref 0–37)
Albumin: 4.3 g/dL (ref 3.5–5.2)
Alkaline Phosphatase: 62 U/L (ref 39–117)
BUN: 17 mg/dL (ref 6–23)
CO2: 31 mEq/L (ref 19–32)
Calcium: 9.7 mg/dL (ref 8.4–10.5)
Chloride: 101 mEq/L (ref 96–112)
Creatinine, Ser: 0.81 mg/dL (ref 0.40–1.50)
GFR: 98.47 mL/min (ref >60.00)
Glucose, Bld: 118 mg/dL — ABNORMAL HIGH (ref 70–99)
Potassium: 3.9 mEq/L (ref 3.5–5.1)
Sodium: 139 mEq/L (ref 135–145)
Total Bilirubin: 0.9 mg/dL (ref 0.2–1.2)
Total Protein: 7.3 g/dL (ref 6.0–8.3)

## 2020-03-14 LAB — HEMOGLOBIN A1C: Hgb A1c MFr Bld: 6.3 % (ref 4.6–6.5)

## 2020-03-14 LAB — TESTOSTERONE: Testosterone: 434.6 ng/dL (ref 300.00–890.00)

## 2020-03-15 ENCOUNTER — Other Ambulatory Visit: Payer: BC Managed Care – PPO

## 2020-03-17 ENCOUNTER — Encounter: Payer: Self-pay | Admitting: Endocrinology

## 2020-03-17 ENCOUNTER — Other Ambulatory Visit: Payer: Self-pay

## 2020-03-17 ENCOUNTER — Telehealth (INDEPENDENT_AMBULATORY_CARE_PROVIDER_SITE_OTHER): Payer: BC Managed Care – PPO | Admitting: Endocrinology

## 2020-03-17 DIAGNOSIS — E039 Hypothyroidism, unspecified: Secondary | ICD-10-CM

## 2020-03-17 DIAGNOSIS — E291 Testicular hypofunction: Secondary | ICD-10-CM | POA: Diagnosis not present

## 2020-03-17 DIAGNOSIS — E119 Type 2 diabetes mellitus without complications: Secondary | ICD-10-CM

## 2020-03-17 DIAGNOSIS — E669 Obesity, unspecified: Secondary | ICD-10-CM | POA: Diagnosis not present

## 2020-03-17 DIAGNOSIS — E1169 Type 2 diabetes mellitus with other specified complication: Secondary | ICD-10-CM | POA: Diagnosis not present

## 2020-03-17 NOTE — Progress Notes (Signed)
Patient ID: Zachary Murray, male   DOB: Oct 04, 1966, 54 y.o.   MRN: [id number]              Today's office visit was provided via telemedicine using video technique Explained to the patient and the the limitations of evaluation and management by telemedicine and the availability of in person appointments.  The patient understood the limitations and agreed to proceed. Patient also understood that the telehealth visit is billable. . Location of the patient: Home . Location of the provider: Office Only the patient and myself were participating in the encounter   Chief complaint: Endocrinology follow-up  History of Present Illness  PROBLEM 1  Hypogonadismwas first diagnosed in 11/18  Since about 2016 or so he has had complaints offatigue He also feels malaise and listless, mostly in the afternoons and did not feel like doing anything at that time Also has some decreased libido  The diagnosis was confirmed by the low free T level of 6.6 done in 5/19 [facility] was inappropriately low at 6.3  He was on a trial of CLOMIPHENE 25 mg 3 times a week With this his energy level and libido were improving but only partially This is despite his testosterone level being as high as 661, his Caroleen had gone up to 20  Because of the lack of adequate symptomatic improvement he is taking AndroGel since 9/19 Appears to be requiring relatively large doses of AndroGel up to 6 pumps a day  With testosterone gel he was feeling significantly better with his energy level overall His dose has been adjusted periodic However because of his hemoglobin going up to 18.3 in 03/2019 he was told to reduce the dose to 4 pumps daily He was taking 5 pumps a day on the AndroGel in November when he was seen but because of fatigue and testosterone level of only 212 he was tried on Sipsey but he could not continue this because of cost  RECENT HISTORY: After his stopping Xyosted his testosterone level was still relatively higher at  297  His previous fatigue symptoms had improved since he started Mysoline for his tremor  After his last visit in 11/2019 he wanted to try treatment again On his own requested clomiphene which he is taking 2 days a week now With this his energy level is improved further He has done a little exercise and overall has lost weight  Previously his hemoglobin has been as high as 18 but is now back down to 13.8 with stopping testosterone supplements  Testosterone level is 434, improved considerably since last visit  Lab Results  Component Value Date   TESTOSTERONE 434.60 03/14/2020   TESTOSTERONE 297.00 (L) 12/09/2019   TESTOSTERONE 212.20 (L) 09/21/2019   TESTOSTERONE 348.03 05/10/2019    Prolactin level: 5.8  Lab Results  Component Value Date   LH 20.04 (H) 05/20/2018   Lab Results  Component Value Date   HGB 13.8 03/14/2020     PROBLEM 2: DIABETES:  Diagnosis date: ?  2016  Previous history:  Although he was diagnosed to have diabetes in 2016 he was not able to tolerate metformin that was previously prescribed Also was tried on Shindler in 2017 and 2018 with some improved control He was tried on Bydureon in 06/2017 and continued until mil 3/19 d He was started on Ozempic and Tresiba insulin in 01/2018 by his PCP when his A1c was 7.8 and fasting glucose 165 Insulin was stopped in 5/19 since his blood sugar was averaging  only 92 on his freestyle sensor at home  Recent history:     Non-insulin hypoglycemic drugs: None    His most recent A1c is 6.3  Current management: He was previously on Ozempic but he stopped this because of cost He says that he is trying to lose weight more on his own with more activity and is doing better with his energy level However still not able to consistently follow diet and reduced portions and snacks  He thinks his weight is about 248 which is improved Says that he is starting to lose a little weight which he had gained previously in  December and is now down to 358       Monitors blood glucose:   Previously with freestyle libre         Blood Glucose readings:  Blood sugar range recently 96-169, AVERAGE 129 Hmmm highest blood sugars in the evenings around 5 or 9 PM Fasting readings are generally about 130-140  Side effects from medications:  Diarrhea from metformin         Meals:  Usually low-fat                    Dietician visit:   Never  Weight control:  Wt Readings from Last 3 Encounters:  02/28/20 (!) 354 lb (160.6 kg)  02/10/20 (!) 356 lb (161.5 kg)  01/27/20 (!) 355 lb 12.8 oz (161.4 kg)   Lab Results  Component Value Date   HGBA1C 6.3 03/14/2020   HGBA1C 5.9 12/27/2019   HGBA1C 5.8 09/21/2019   Lab Results  Component Value Date   MICROALBUR 3.6 (H) 03/29/2019   LDLCALC 63 12/27/2019   CREATININE 0.81 03/14/2020    HYPOTHYROIDISM:  He was previously given levothyroxine for high TSH in 12/16 but later stopped because of improved TSH  Because of his fatigue he had been started on levothyroxine in 3/19 with only slight improvement in his fatigue  He has been taking 75 mcg of levothyroxine and has been on the same dose since July 2019 Had not had any improvement in his fatigue with increasing the dose  TSH consistently normal as below     Lab Results  Component Value Date   TSH 2.43 12/27/2019   TSH 1.99 09/21/2019   TSH 2.28 03/29/2019   FREET4 1.41 09/21/2019   FREET4 1.07 03/29/2019   FREET4 0.82 07/21/2018        Allergies as of 03/17/2020      Reactions   Metformin And Related Diarrhea   Wellbutrin [bupropion] Other (See Comments)   tremors   Lisinopril Cough      Medication List       Accurate as of March 17, 2020  9:22 AM. If you have any questions, ask your nurse or doctor.        STOP taking these medications   FreeStyle Libre Sensor System Misc Stopped by: Elayne Snare, MD     TAKE these medications   ALPRAZolam 0.5 MG tablet Commonly known as:  XANAX TAKE 1 TABLET(0.5 MG) BY MOUTH THREE TIMES DAILY AS NEEDED FOR ANXIETY   BERBERINE COMPLEX PO   cilostazol 100 MG tablet Commonly known as: PLETAL TAKE 1 TABLET(100 MG) BY MOUTH TWICE DAILY   clomiPHENE 50 MG tablet Commonly known as: CLOMID Take 25 mg by mouth daily. Take 1/2 tablet by mouth twice weekly on Tuesday and Friday   GLUCOSAMINE 1500 COMPLEX PO   glucose blood test strip Use onetouch verio flex  test strip as instructed to check blood sugar once daily.   Insulin Pen Needle 31G X 6 MM Misc Commonly known as: ReliOn Pen Needles Use as needed for insulin injection.   levothyroxine 75 MCG tablet Commonly known as: SYNTHROID Take 1 tablet (75 mcg total) by mouth daily.   onetouch ultrasoft lancets Use onetouch delica lancet to check blood sugar once daily.   OneTouch Verio Flex System w/Device Kit Use as instructed to check blood sugar once daily.   primidone 50 MG tablet Commonly known as: MYSOLINE Take 1 tablet (50 mg total) by mouth at bedtime.   propranolol 40 MG tablet Commonly known as: INDERAL Take 1 tablet (40 mg total) by mouth 3 (three) times daily.   triamterene-hydrochlorothiazide 37.5-25 MG tablet Commonly known as: MAXZIDE-25 TAKE 1 TABLET BY MOUTH DAILY   Vitamin D (Ergocalciferol) 1.25 MG (50000 UNIT) Caps capsule Commonly known as: DRISDOL TAKE 1 CAPSULE BY MOUTH 1 TIME A WEEK       Allergies:  Allergies  Allergen Reactions  . Metformin And Related Diarrhea  . Wellbutrin [Bupropion] Other (See Comments)    tremors  . Lisinopril Cough    Past Medical History:  Diagnosis Date  . Hypertension   . Obesity   . OSA on CPAP   . Type II diabetes mellitus with manifestations [facility])     Past Surgical History:  Procedure Laterality Date  . BIOPSY  08/31/2018   Procedure: BIOPSY;  Surgeon: Mauri Pole, MD;  Location: [facility];  Service: Endoscopy;;  . COLONOSCOPY WITH PROPOFOL N/A 11/12/2016   Procedure: COLONOSCOPY  WITH PROPOFOL;  Surgeon: Mauri Pole, MD;  Location: [facility];  Service: Endoscopy;  Laterality: N/A;  . COLONOSCOPY WITH PROPOFOL N/A 08/31/2018   Procedure: COLONOSCOPY WITH PROPOFOL;  Surgeon: Mauri Pole, MD;  Location: [facility];  Service: Endoscopy;  Laterality: N/A;  . POLYPECTOMY  08/31/2018   Procedure: POLYPECTOMY;  Surgeon: Mauri Pole, MD;  Location: [facility];  Service: Endoscopy;;  . WISDOM TOOTH EXTRACTION      Family History  Problem Relation Age of Onset  . Colon cancer Father   . Hypertension Father   . Thyroid cancer Mother   . Colon polyps Mother   . Thyroid disease Mother   . Thyroid disease Sister   . Thyroid disease Maternal Grandmother   . Alcohol abuse Neg Hx   . COPD Neg Hx   . Diabetes Neg Hx   . Early death Neg Hx   . Hearing loss Neg Hx   . Heart disease Neg Hx   . Hyperlipidemia Neg Hx   . Kidney disease Neg Hx   . Stroke Neg Hx   . Esophageal cancer Neg Hx     Social History:  reports that he has never smoked. He has quit using smokeless tobacco.  His smokeless tobacco use included chew. He reports that he does not drink alcohol or use drugs.  Review of Systems    LIPIDS: LDL below 100, not on a statin drug Followed by PCP  Lab Results  Component Value Date   CHOL 121 12/27/2019   HDL 48.70 12/27/2019   LDLCALC 63 12/27/2019   TRIG 48.0 12/27/2019   CHOLHDL 2 12/27/2019     General Examination:   There were no vitals taken for this visit.    Assessment/ Plan:   [place] HYPOGONADISM with symptoms of fatigue and low free testosterone at baseline  He has been symptomatic with fatigue and  decreased libido  He was now off medications since about 11/2019 because of cost of Xyosted and difficulty getting adequate levels with area along with associated erythrocytosis He is now taking clomiphene 25 mg twice a week because he wanted to improve his energy level and his testosterone levels were  below 300 consistently  Previously with this he had significantly improved testosterone levels and LH had been up to 20 with the 25 mg 3 times a week Now with clomiphene twice a week is level is over 400 He is wanting to try and take the clomiphene 3 days a week even though his testosterone level was still good on the day of his dose He can try this but will need to make sure his [place] does not go up excessively  DIABETES with morbid obesity  A1c 6.3 He does not want to take Ozempic again which was working well because of cost However he is trying to keep his weight down  HYPOTHYROIDISM: Well regulated with 75 mcg levothyroxine  Follow-up in 3 months  Linnet Bottari 03/17/2020, 9:22 AM      Note: This office note was prepared with Dragon voice recognition system technology. Any transcriptional errors that result from this process are unintentional.     Elayne Snare 03/17/2020, 9:22 AM

## 2020-04-01 ENCOUNTER — Other Ambulatory Visit: Payer: Self-pay | Admitting: Internal Medicine

## 2020-04-01 DIAGNOSIS — F411 Generalized anxiety disorder: Secondary | ICD-10-CM

## 2020-04-05 ENCOUNTER — Other Ambulatory Visit: Payer: Self-pay | Admitting: Internal Medicine

## 2020-04-05 DIAGNOSIS — I10 Essential (primary) hypertension: Secondary | ICD-10-CM

## 2020-04-18 ENCOUNTER — Other Ambulatory Visit: Payer: Self-pay | Admitting: Endocrinology

## 2020-04-18 MED ORDER — CLOMIPHENE CITRATE 50 MG PO TABS: ORAL_TABLET | ORAL | 1 refills | Status: DC

## 2020-05-02 NOTE — Progress Notes (Signed)
Assessment/Plan:    1.  Essential Tremor  -Continue propranolol, 40 mg 3 times daily  -Continue primidone, 50 mg, half tablet at bedtime.  He reports higher dosages cause some type of electric shocks in the shoulder.  This is not an issue I have heard of but nonetheless, it is something that he has experienced. He does think that he could get "used to" the meds as sometimes he takes a full tablet.     2.  D/c back to PCP as not changing meds.  F/u prn  Subjective:   Zachary Murray was seen today in follow up for essential tremor.  My previous records were reviewed prior to todays visit.  Started him on primidone last visit.  He emailed me not long thereafter stating that he really felt good on 25 mg, and did not feel good at 50 mg.  I told him to just stay on 25 mg for now and see how he does.   He thinks that tremor is better with the addition of primidone but he states that he has good and bad days.  Some days it is "barely controlled" and other days he is better.  Generally, he is on the "controlled side."   He has trouble with fine motor skills.   Medical records are reviewed since last visit.  Saw Dr. Dwyane Dee.  His Clomid was increased.  Current prescribed movement disorder medications: Propranolol, 40 mg 3 times per day Primidone, 50 mg, half tablet at bedtime  Prior meds:  artane   ALLERGIES:   Allergies  Allergen Reactions  . Metformin And Related Diarrhea  . Wellbutrin [Bupropion] Other (See Comments)    tremors  . Lisinopril Cough    CURRENT MEDICATIONS:  Outpatient Encounter Medications as of 05/04/2020  Medication Sig  . ALPRAZolam (XANAX) 0.5 MG tablet TAKE 1 TABLET(0.5 MG) BY MOUTH THREE TIMES DAILY AS NEEDED FOR ANXIETY  . Barberry-Oreg Grape-Goldenseal (BERBERINE COMPLEX PO) Take 1 capsule by mouth daily.   . Blood Glucose Monitoring Suppl ([place]) w/Device KIT Use as instructed to check blood sugar once daily.  . cilostazol (PLETAL) 100 MG  tablet TAKE 1 TABLET(100 MG) BY MOUTH TWICE DAILY  . clomiPHENE (CLOMID) 50 MG tablet Take 1/2 tablet by mouth 3 times a week  . Glucosamine-Chondroit-Vit C-Mn (GLUCOSAMINE 1500 COMPLEX PO) Take 1 capsule by mouth daily.   Marland Kitchen glucose blood test strip Use onetouch verio flex test strip as instructed to check blood sugar once daily.  . Insulin Pen Needle (RELION PEN NEEDLES) 31G X 6 MM MISC Use as needed for insulin injection.  . Lancets (ONETOUCH ULTRASOFT) lancets Use onetouch delica lancet to check blood sugar once daily.  Marland Kitchen levothyroxine (SYNTHROID) 75 MCG tablet Take 1 tablet (75 mcg total) by mouth daily.  . primidone (MYSOLINE) 50 MG tablet Take 1 tablet (50 mg total) by mouth at bedtime.  . propranolol (INDERAL) 40 MG tablet Take 1 tablet (40 mg total) by mouth 3 (three) times daily.  Marland Kitchen triamterene-hydrochlorothiazide (MAXZIDE-25) 37.5-25 MG tablet TAKE 1 TABLET BY MOUTH DAILY  . Vitamin D, Ergocalciferol, (DRISDOL) 1.25 MG (50000 UNIT) CAPS capsule TAKE 1 CAPSULE BY MOUTH 1 TIME A WEEK  . [DISCONTINUED] triamterene-hydrochlorothiazide (MAXZIDE-25) 37.5-25 MG tablet TAKE 1 TABLET BY MOUTH DAILY   No facility-administered encounter medications on file as of 05/04/2020.     Objective:    PHYSICAL EXAMINATION:    VITALS:   Vitals:   05/04/20 0850  BP: 103/69  Pulse: 75  SpO2: 96%  Weight: (!) 352 lb (159.7 kg)  Height: 6' (1.829 m)    GEN:  The patient appears stated age and is in NAD. HEENT:  Normocephalic, atraumatic.  The mucous membranes are moist. The superficial temporal arteries are without ropiness or tenderness. CV:  RRR Lungs:  CTAB Neck/HEME:  There are no carotid bruits bilaterally.  Neurological examination:  Orientation: The patient is alert and oriented x3. Cranial nerves: There is good facial symmetry. The speech is fluent and clear. Soft palate rises symmetrically and there is no tongue deviation. Hearing is intact to conversational tone. Sensation:  Sensation is intact to light touch throughout Motor: Strength is at least antigravity x4.  Movement examination: Tone: There is normal tone in the UE/LE Abnormal movements: there is no rest tremor.  There is no postural tremor.  There is irregular ?nonphysiologic tremor on the L Coordination:  There is no decremation with RAM's Gait and Station: The patient has no difficulty arising out of a deep-seated chair without the use of the hands. The patient's stride length is good I have reviewed and interpreted the following labs independently   Chemistry      Component Value Date/Time   NA 139 03/14/2020 0957   K 3.9 03/14/2020 0957   CL 101 03/14/2020 0957   CO2 31 03/14/2020 0957   BUN 17 03/14/2020 0957   CREATININE 0.81 03/14/2020 0957      Component Value Date/Time   CALCIUM 9.7 03/14/2020 0957   ALKPHOS 62 03/14/2020 0957   AST 21 03/14/2020 0957   ALT 17 03/14/2020 0957   BILITOT 0.9 03/14/2020 0957      Lab Results  Component Value Date   WBC 6.2 03/14/2020   HGB 13.8 03/14/2020   HCT 40.2 03/14/2020   MCV 88.4 03/14/2020   PLT 220.0 03/14/2020   Lab Results  Component Value Date   TSH 2.43 12/27/2019     Chemistry      Component Value Date/Time   NA 139 03/14/2020 0957   K 3.9 03/14/2020 0957   CL 101 03/14/2020 0957   CO2 31 03/14/2020 0957   BUN 17 03/14/2020 0957   CREATININE 0.81 03/14/2020 0957      Component Value Date/Time   CALCIUM 9.7 03/14/2020 0957   ALKPHOS 62 03/14/2020 0957   AST 21 03/14/2020 0957   ALT 17 03/14/2020 0957   BILITOT 0.9 03/14/2020 0957         Total time spent on today's visit was 25 minutes, including both face-to-face time and nonface-to-face time.  Time included that spent on review of records (prior notes available to me/labs/imaging if pertinent), discussing treatment and goals, answering patient's questions and coordinating care.  Cc:  [name], MD

## 2020-05-04 ENCOUNTER — Ambulatory Visit: Payer: BC Managed Care – PPO | Admitting: Neurology

## 2020-05-04 ENCOUNTER — Encounter: Payer: Self-pay | Admitting: Neurology

## 2020-05-04 ENCOUNTER — Other Ambulatory Visit: Payer: Self-pay

## 2020-05-04 VITALS — BP 103/69 | HR 75 | Ht 72.0 in | Wt 352.0 lb

## 2020-05-04 DIAGNOSIS — G250 Essential tremor: Secondary | ICD-10-CM | POA: Diagnosis not present

## 2020-05-04 NOTE — Patient Instructions (Signed)
The physicians and staff at  [facility] are committed to providing excellent care. You may receive a survey requesting feedback about your experience at our office. We strive to receive "very good" responses to the survey questions. If you feel that your experience would prevent you from giving the office a "very good " response, please contact our office to try to remedy the situation. We may be reached at [phone]. Thank you for taking the time out of your busy day to complete the survey.  

## 2020-05-10 ENCOUNTER — Encounter: Payer: Self-pay | Admitting: Internal Medicine

## 2020-05-11 ENCOUNTER — Other Ambulatory Visit: Payer: Self-pay | Admitting: Internal Medicine

## 2020-05-11 DIAGNOSIS — E039 Hypothyroidism, unspecified: Secondary | ICD-10-CM

## 2020-05-11 DIAGNOSIS — E559 Vitamin D deficiency, unspecified: Secondary | ICD-10-CM

## 2020-05-11 DIAGNOSIS — I739 Peripheral vascular disease, unspecified: Secondary | ICD-10-CM

## 2020-05-11 MED ORDER — CILOSTAZOL 100 MG PO TABS: ORAL_TABLET | ORAL | 0 refills | Status: DC

## 2020-05-11 MED ORDER — VITAMIN D (ERGOCALCIFEROL) 1.25 MG (50000 UNIT) PO CAPS: ORAL_CAPSULE | ORAL | 0 refills | Status: DC

## 2020-05-11 MED ORDER — LEVOTHYROXINE SODIUM 75 MCG PO TABS: 75.0000 ug | ORAL_TABLET | Freq: Every day | ORAL | 0 refills | Status: DC

## 2020-05-16 ENCOUNTER — Other Ambulatory Visit: Payer: Self-pay

## 2020-05-16 ENCOUNTER — Encounter: Payer: Self-pay | Admitting: Internal Medicine

## 2020-05-16 ENCOUNTER — Ambulatory Visit (INDEPENDENT_AMBULATORY_CARE_PROVIDER_SITE_OTHER): Payer: BC Managed Care – PPO | Admitting: Family

## 2020-05-16 ENCOUNTER — Telehealth: Payer: Self-pay

## 2020-05-16 ENCOUNTER — Encounter: Payer: Self-pay | Admitting: Family

## 2020-05-16 VITALS — BP 132/74 | HR 77 | Temp 99.0°F | Ht 72.0 in | Wt 351.0 lb

## 2020-05-16 DIAGNOSIS — R198 Other specified symptoms and signs involving the digestive system and abdomen: Secondary | ICD-10-CM

## 2020-05-16 MED ORDER — SULFAMETHOXAZOLE-TRIMETHOPRIM 800-160 MG PO TABS
1.0000 | ORAL_TABLET | Freq: Two times a day (BID) | ORAL | 0 refills | Status: DC
Start: 2020-05-16 — End: 2020-05-19

## 2020-05-16 NOTE — Progress Notes (Signed)
Zachary Murray is a 54 y.o. male with the following history as recorded in EpicCare:  Patient Active Problem List   Diagnosis Date Noted  . Pain in joint of left hand 03/03/2020  . Skin lesion 01/03/2020  . Genital warts 01/03/2020  . Other viral warts 12/27/2019  . Epidermal cyst of face 12/27/2019  . Peripheral vascular disease ([place]) 05/06/2019  . Polyp of descending colon   . Polyp of transverse colon   . Tremor, essential 07/27/2018  . Vitamin D deficiency 02/11/2018  . Hypogonadism male 02/11/2018  . GAD (generalized anxiety disorder) 10/22/2017  . Meralgia paresthetica, left 10/02/2017  . Routine general medical examination at a health care facility 03/26/2017  . Severe episode of recurrent major depressive disorder, without psychotic features ([place]) 03/26/2017  . Lumbar radiculopathy 07/02/2016  . Type II diabetes mellitus with manifestations ([place]) 10/26/2015  . Hypothyroidism 12/08/2014  . Hyperlipidemia with target LDL less than 130 11/19/2013  . OSA (obstructive sleep apnea) 11/19/2013  . Severe obesity (BMI >= 40) ([place]) 09/15/2012  . Essential hypertension, benign 09/15/2012    Current Outpatient Medications  Medication Sig Dispense Refill  . ALPRAZolam (XANAX) 0.5 MG tablet TAKE 1 TABLET(0.5 MG) BY MOUTH THREE TIMES DAILY AS NEEDED FOR ANXIETY 270 tablet 0  . Barberry-Oreg Grape-Goldenseal (BERBERINE COMPLEX PO) Take 1 capsule by mouth daily.     . Blood Glucose Monitoring Suppl ([place]) w/Device KIT Use as instructed to check blood sugar once daily. 1 kit 0  . cilostazol (PLETAL) 100 MG tablet TAKE 1 TABLET(100 MG) BY MOUTH TWICE DAILY 180 tablet 0  . clomiPHENE (CLOMID) 50 MG tablet Take 1/2 tablet by mouth 3 times a week 20 tablet 1  . Glucosamine-Chondroit-Vit C-Mn (GLUCOSAMINE 1500 COMPLEX PO) Take 1 capsule by mouth daily.     Marland Kitchen glucose blood test strip Use onetouch verio flex test strip as instructed to check blood sugar once daily. 100 each 3   . Insulin Pen Needle (RELION PEN NEEDLES) 31G X 6 MM MISC Use as needed for insulin injection. 100 each 3  . Lancets (ONETOUCH ULTRASOFT) lancets Use onetouch delica lancet to check blood sugar once daily. 100 each 3  . levothyroxine (SYNTHROID) 75 MCG tablet Take 1 tablet (75 mcg total) by mouth daily. 90 tablet 0  . primidone (MYSOLINE) 50 MG tablet Take 1 tablet (50 mg total) by mouth at bedtime. 90 tablet 1  . propranolol (INDERAL) 40 MG tablet Take 1 tablet (40 mg total) by mouth 3 (three) times daily. 270 tablet 1  . sulfamethoxazole-trimethoprim (BACTRIM DS) 800-160 MG tablet Take 1 tablet by mouth 2 (two) times daily. 10 tablet 0  . triamterene-hydrochlorothiazide (MAXZIDE-25) 37.5-25 MG tablet TAKE 1 TABLET BY MOUTH DAILY 90 tablet 0  . Vitamin D, Ergocalciferol, (DRISDOL) 1.25 MG (50000 UNIT) CAPS capsule TAKE 1 CAPSULE BY MOUTH 1 TIME A WEEK 12 capsule 0   No current facility-administered medications for this visit.    Allergies: Metformin and related, Wellbutrin [bupropion], and Lisinopril  Past Medical History:  Diagnosis Date  . Hypertension   . Obesity   . OSA on CPAP   . Type II diabetes mellitus with manifestations [facility])     Past Surgical History:  Procedure Laterality Date  . BIOPSY  08/31/2018   Procedure: BIOPSY;  Surgeon: Mauri Pole, MD;  Location: [facility];  Service: Endoscopy;;  . COLONOSCOPY WITH PROPOFOL N/A 11/12/2016   Procedure: COLONOSCOPY WITH PROPOFOL;  Surgeon: Mauri Pole,  MD;  Location: [facility];  Service: Endoscopy;  Laterality: N/A;  . COLONOSCOPY WITH PROPOFOL N/A 08/31/2018   Procedure: COLONOSCOPY WITH PROPOFOL;  Surgeon: Mauri Pole, MD;  [facility];  Service: Endoscopy;  Laterality: N/A;  . POLYPECTOMY  08/31/2018   Procedure: POLYPECTOMY;  Surgeon: Mauri Pole, MD;  Location: [facility];  Service: Endoscopy;;  . WISDOM TOOTH EXTRACTION      Family History  Problem Relation Age of Onset   . Colon cancer Father   . Hypertension Father   . Thyroid cancer Mother   . Colon polyps Mother   . Thyroid disease Mother   . Thyroid disease Sister   . Thyroid disease Maternal Grandmother   . Alcohol abuse Neg Hx   . COPD Neg Hx   . Diabetes Neg Hx   . Early death Neg Hx   . Hearing loss Neg Hx   . Heart disease Neg Hx   . Hyperlipidemia Neg Hx   . Kidney disease Neg Hx   . Stroke Neg Hx   . Esophageal cancer Neg Hx     Social History   Tobacco Use  . Smoking status: Never Smoker  . Smokeless tobacco: Former Systems developer    Types: Chew  Substance Use Topics  . Alcohol use: No    Alcohol/week: 0.0 standard drinks    Comment: hx of alcohol abuse - quit 2017    Subjective:  Complaining of 1 day history of naval bleeding and tenderness; no know injury or trauma; no fever at home but feels that temperature seen today is "high for him." Does feel that his blood sugars have been more elevated in the past 1-2 months- scheduled to follow-up with his endocrinologist;    Objective:  Vitals:   05/16/20 1207  BP: 132/74  Pulse: 77  Temp: 99 F (37.2 C)  TempSrc: Oral  SpO2: 94%  Weight: (!) 351 lb (159.2 kg)  Height: 6' (1.829 m)    General: Well developed, well nourished, in no acute distress  Skin : Warm and dry. Bloody discharge noted in umbilicus; surrounding erythema but patient feels this is related to tape Lungs: Respirations unlabored; clear to auscultation bilaterally without wheeze, rales, rhonchi  Neurologic: Alert and oriented; speech intact; face symmetrical; moves all extremities well; CNII-XII intact without focal deficit   Assessment:  1. Umbilical bleeding     Plan:  Limited visualization due to body habitus; will treat for infection with Bactrim DS bid x 5 days; follow- worse, no better.   This visit occurred during the SARS-CoV-2 public health emergency.  Safety protocols were in place, including screening questions prior to the visit, additional usage of  staff PPE, and extensive cleaning of exam room while observing appropriate contact time as indicated for disinfecting solutions.     No follow-ups on file.  No orders of the defined types were placed in this encounter.   Requested Prescriptions   Signed Prescriptions Disp Refills  . sulfamethoxazole-trimethoprim (BACTRIM DS) 800-160 MG tablet 10 tablet 0    Sig: Take 1 tablet by mouth 2 (two) times daily.

## 2020-05-16 NOTE — Telephone Encounter (Signed)
Error

## 2020-05-19 ENCOUNTER — Other Ambulatory Visit: Payer: Self-pay | Admitting: Family

## 2020-05-19 ENCOUNTER — Encounter: Payer: Self-pay | Admitting: Family

## 2020-05-19 MED ORDER — CEFUROXIME AXETIL 250 MG PO TABS: 250.0000 mg | ORAL_TABLET | Freq: Two times a day (BID) | ORAL | 0 refills | Status: DC

## 2020-05-19 NOTE — Telephone Encounter (Signed)
   Patient seeking advice, states unbiblical area is not looking better

## 2020-06-07 ENCOUNTER — Ambulatory Visit: Payer: BC Managed Care – PPO | Admitting: Neurology

## 2020-06-07 ENCOUNTER — Other Ambulatory Visit (INDEPENDENT_AMBULATORY_CARE_PROVIDER_SITE_OTHER): Payer: BC Managed Care – PPO

## 2020-06-07 ENCOUNTER — Other Ambulatory Visit: Payer: Self-pay

## 2020-06-07 DIAGNOSIS — E291 Testicular hypofunction: Secondary | ICD-10-CM | POA: Diagnosis not present

## 2020-06-07 DIAGNOSIS — E039 Hypothyroidism, unspecified: Secondary | ICD-10-CM

## 2020-06-07 DIAGNOSIS — E669 Obesity, unspecified: Secondary | ICD-10-CM

## 2020-06-07 DIAGNOSIS — E1169 Type 2 diabetes mellitus with other specified complication: Secondary | ICD-10-CM

## 2020-06-07 LAB — BASIC METABOLIC PANEL
BUN: 11 mg/dL (ref 6–23)
CO2: 32 mEq/L (ref 19–32)
Calcium: 9.5 mg/dL (ref 8.4–10.5)
Chloride: 103 mEq/L (ref 96–112)
Creatinine, Ser: 0.94 mg/dL (ref 0.40–1.50)
GFR: 82.86 mL/min (ref >60.00)
Glucose, Bld: 135 mg/dL — ABNORMAL HIGH (ref 70–99)
Potassium: 4.2 mEq/L (ref 3.5–5.1)
Sodium: 141 mEq/L (ref 135–145)

## 2020-06-07 LAB — CBC
HCT: 43 % (ref 39.0–52.0)
Hemoglobin: 14.7 g/dL (ref 13.0–17.0)
MCHC: 34.1 g/dL (ref 30.0–36.0)
MCV: 89.8 fl (ref 78.0–100.0)
Platelets: 198 10*3/uL (ref 150.0–400.0)
RBC: 4.79 Mil/uL (ref 4.22–5.81)
RDW: 13.1 % (ref 11.5–15.5)
WBC: 5.9 10*3/uL (ref 4.0–10.5)

## 2020-06-07 LAB — HEMOGLOBIN A1C: Hgb A1c MFr Bld: 6.6 % — ABNORMAL HIGH (ref 4.6–6.5)

## 2020-06-07 LAB — TSH: TSH: 2.1 u[IU]/mL (ref 0.35–4.50)

## 2020-06-07 LAB — TESTOSTERONE: Testosterone: 509.06 ng/dL (ref 300.00–890.00)

## 2020-06-07 LAB — LUTEINIZING HORMONE: LH: 19.08 m[IU]/mL — ABNORMAL HIGH (ref 1.50–9.30)

## 2020-06-07 LAB — T4, FREE: Free T4: 1.16 ng/dL (ref 0.60–1.60)

## 2020-06-09 ENCOUNTER — Other Ambulatory Visit: Payer: Self-pay

## 2020-06-09 ENCOUNTER — Ambulatory Visit: Payer: BC Managed Care – PPO | Admitting: Endocrinology

## 2020-06-09 ENCOUNTER — Encounter: Payer: Self-pay | Admitting: Endocrinology

## 2020-06-09 VITALS — BP 130/72 | HR 71 | Ht 72.0 in | Wt 356.4 lb

## 2020-06-09 DIAGNOSIS — E230 Hypopituitarism: Secondary | ICD-10-CM | POA: Diagnosis not present

## 2020-06-09 DIAGNOSIS — E1169 Type 2 diabetes mellitus with other specified complication: Secondary | ICD-10-CM | POA: Diagnosis not present

## 2020-06-09 DIAGNOSIS — E039 Hypothyroidism, unspecified: Secondary | ICD-10-CM | POA: Diagnosis not present

## 2020-06-09 DIAGNOSIS — R5382 Chronic fatigue, unspecified: Secondary | ICD-10-CM | POA: Diagnosis not present

## 2020-06-09 DIAGNOSIS — E669 Obesity, unspecified: Secondary | ICD-10-CM

## 2020-06-09 MED ORDER — TESTOSTERONE 20.25 MG/ACT (1.62%) TD GEL: TRANSDERMAL | 2 refills | Status: DC

## 2020-06-09 NOTE — Progress Notes (Signed)
Patient ID: Zachary Murray, male   DOB: February 01, 1966, 54 y.o.   MRN: [id number]              Chief complaint: Endocrinology follow-up  History of Present Illness  PROBLEM 1  Hypogonadismwas first diagnosed in 11/18  Since about 2016 or so he has had complaints offatigue He also feels malaise and listless, mostly in the afternoons and did not feel like doing anything at that time Also has some decreased libido  The diagnosis was confirmed by the low free T level of 6.6 done in 5/19 [facility] was inappropriately low at 6.3  He was on a trial of CLOMIPHENE 25 mg 3 times a week With this his energy level and libido were improving but only partially This is despite his testosterone level being as high as 661, his LH had gone up to 20  Because of the lack of adequate symptomatic improvement he is taking AndroGel since 9/19 Appears to be requiring relatively large doses of AndroGel up to 6 pumps a day  With testosterone gel he was feeling significantly better with his energy level overall His dose has been adjusted periodic However because of his hemoglobin going up to 18.3 in 03/2019 he was told to reduce the dose to 4 pumps daily He was taking 5 pumps a day on the AndroGel in November when he was seen but because of fatigue and testosterone level of only 212 he was tried on [place] but he could not continue this because of cost  RECENT HISTORY: After his stopping Xyosted his testosterone level was still  at 297  His previous fatigue symptoms had improved since he started Mysoline for his tremor  In 2021 he had requested trying clomiphene because of fatigue  With this his energy level did improve although he felt that he wanted a higher dose than twice a week His testosterone is now slightly higher at 509 compared to 435 with increasing the dose to half tablet 3 times a week up to 50 mg  Although he had felt fairly good on the previous visit he thinks that he has somewhat decreased  motivation level and at times will get tired His stamina may not be as good  Previously his hemoglobin has been as high as 18 but is now consistently normal  LH level has gone up to 19  Lab Results  Component Value Date   TESTOSTERONE 509.06 06/07/2020   TESTOSTERONE 434.60 03/14/2020   TESTOSTERONE 297.00 (L) 12/09/2019   TESTOSTERONE 212.20 (L) 09/21/2019    Prolactin level: 5.8  Lab Results  Component Value Date   LH 19.08 (H) 06/07/2020   Lab Results  Component Value Date   HGB 14.7 06/07/2020     PROBLEM 2: DIABETES:  Diagnosis date: ?  2016  Previous history:  Although he was diagnosed to have diabetes in 2016 he was not able to tolerate metformin that was previously prescribed Also was tried on Venturia in 2017 and 2018 with some improved control He was tried on Bydureon in 06/2017 and continued until mil 3/19 d He was started on [place] insulin in 01/2018 by his PCP when his A1c was 7.8 and fasting glucose 165 Insulin was stopped in 5/19 since his blood sugar was averaging only 92 on his freestyle sensor at home  Recent history:     Non-insulin hypoglycemic drugs: None    His most recent A1c is 6.6 compared to 6.3  Current management: He was previously on  Ozempic but he stopped this because of cost   Although may have lost weight earlier this year this appears to be unchanged or higher  He is doing some physical activities with yard work etc.  However did better with Ozempic as far as weight loss and blood sugar control  Appears to have sporadic high blood sugars over 200 nonfasting and occasional high fasting readings also  He thinks his weight is about 248 which is improved  Says that he is starting to lose a little weight which he had gained previously in December and is now down to 358  Blood sugars are overall higher than before       Monitors blood glucose:   One Touch Verio meter         Blood Glucose readings from  download:   PRE-MEAL Fasting Lunch Dinner Bedtime Overall  Glucose range:  95-146  143, 186    95-202  Mean/median:    128   142   POST-MEAL PC Breakfast PC Lunch PC Dinner  Glucose range:  166  116-202  153  Mean/median:       Side effects from medications:  Diarrhea from metformin         Meals:  Usually low-fat                    Dietician visit:   Never  Weight control:  Wt Readings from Last 3 Encounters:  06/09/20 (!) 356 lb 6.4 oz (161.7 kg)  05/16/20 (!) 351 lb (159.2 kg)  05/04/20 (!) 352 lb (159.7 kg)   Lab Results  Component Value Date   HGBA1C 6.6 (H) 06/07/2020   HGBA1C 6.3 03/14/2020   HGBA1C 5.9 12/27/2019   Lab Results  Component Value Date   MICROALBUR 3.6 (H) 03/29/2019   LDLCALC 63 12/27/2019   CREATININE 0.94 06/07/2020    HYPOTHYROIDISM:  He was previously given levothyroxine for high TSH in 12/16 but later stopped because of improved TSH  Because of his fatigue he had been started on levothyroxine in 3/19 with only slight improvement in his fatigue  He has been taking 75 mcg of levothyroxine and has been on the same dose since July 2019 Had not had any improvement in his fatigue with increasing the dose  TSH consistently normal as below     Lab Results  Component Value Date   TSH 2.10 06/07/2020   TSH 2.43 12/27/2019   TSH 1.99 09/21/2019   FREET4 1.16 06/07/2020   FREET4 1.41 09/21/2019   FREET4 1.07 03/29/2019        Allergies as of 06/09/2020      Reactions   Metformin And Related Diarrhea   Wellbutrin [bupropion] Other (See Comments)   tremors   Lisinopril Cough      Medication List       Accurate as of June 09, 2020  8:52 AM. If you have any questions, ask your nurse or doctor.        ALPRAZolam 0.5 MG tablet Commonly known as: XANAX TAKE 1 TABLET(0.5 MG) BY MOUTH THREE TIMES DAILY AS NEEDED FOR ANXIETY   BERBERINE COMPLEX PO Take 1 capsule by mouth daily.   cefUROXime 250 MG tablet Commonly known  as: CEFTIN Take 1 tablet (250 mg total) by mouth 2 (two) times daily with a meal.   cilostazol 100 MG tablet Commonly known as: PLETAL TAKE 1 TABLET(100 MG) BY MOUTH TWICE DAILY   clomiPHENE 50 MG tablet Commonly known as:  CLOMID Take 1/2 tablet by mouth 3 times a week   GLUCOSAMINE 1500 COMPLEX PO Take 1 capsule by mouth daily.   glucose blood test strip Use onetouch verio flex test strip as instructed to check blood sugar once daily.   Insulin Pen Needle 31G X 6 MM Misc Commonly known as: ReliOn Pen Needles Use as needed for insulin injection.   levothyroxine 75 MCG tablet Commonly known as: SYNTHROID Take 1 tablet (75 mcg total) by mouth daily.   onetouch ultrasoft lancets Use onetouch delica lancet to check blood sugar once daily.   OneTouch Verio Flex System w/Device Kit Use as instructed to check blood sugar once daily.   primidone 50 MG tablet Commonly known as: MYSOLINE Take 1 tablet (50 mg total) by mouth at bedtime.   propranolol 40 MG tablet Commonly known as: INDERAL Take 1 tablet (40 mg total) by mouth 3 (three) times daily.   triamterene-hydrochlorothiazide 37.5-25 MG tablet Commonly known as: MAXZIDE-25 TAKE 1 TABLET BY MOUTH DAILY   Vitamin D (Ergocalciferol) 1.25 MG (50000 UNIT) Caps capsule Commonly known as: DRISDOL TAKE 1 CAPSULE BY MOUTH 1 TIME A WEEK       Allergies:  Allergies  Allergen Reactions  . Metformin And Related Diarrhea  . Wellbutrin [Bupropion] Other (See Comments)    tremors  . Lisinopril Cough    Past Medical History:  Diagnosis Date  . Hypertension   . Obesity   . OSA on CPAP   . Type II diabetes mellitus with manifestations [facility])     Past Surgical History:  Procedure Laterality Date  . BIOPSY  08/31/2018   Procedure: BIOPSY;  Surgeon: Mauri Pole, MD;  Location: [facility];  Service: Endoscopy;;  . COLONOSCOPY WITH PROPOFOL N/A 11/12/2016   Procedure: COLONOSCOPY WITH PROPOFOL;  Surgeon: Mauri Pole, MD;  Location: [facility];  Service: Endoscopy;  Laterality: N/A;  . COLONOSCOPY WITH PROPOFOL N/A 08/31/2018   Procedure: COLONOSCOPY WITH PROPOFOL;  Surgeon: Mauri Pole, MD;  Location: [facility];  Service: Endoscopy;  Laterality: N/A;  . POLYPECTOMY  08/31/2018   Procedure: POLYPECTOMY;  Surgeon: Mauri Pole, MD;  Location: [facility];  Service: Endoscopy;;  . WISDOM TOOTH EXTRACTION      Family History  Problem Relation Age of Onset  . Colon cancer Father   . Hypertension Father   . Thyroid cancer Mother   . Colon polyps Mother   . Thyroid disease Mother   . Thyroid disease Sister   . Thyroid disease Maternal Grandmother   . Alcohol abuse Neg Hx   . COPD Neg Hx   . Diabetes Neg Hx   . Early death Neg Hx   . Hearing loss Neg Hx   . Heart disease Neg Hx   . Hyperlipidemia Neg Hx   . Kidney disease Neg Hx   . Stroke Neg Hx   . Esophageal cancer Neg Hx     Social History:  reports that he has never smoked. He has quit using smokeless tobacco.  His smokeless tobacco use included chew. He reports that he does not drink alcohol and does not use drugs.  Review of Systems    LIPIDS: LDL below 100, not on a statin drug Followed by PCP  Lab Results  Component Value Date   CHOL 121 12/27/2019   HDL 48.70 12/27/2019   LDLCALC 63 12/27/2019   TRIG 48.0 12/27/2019   CHOLHDL 2 12/27/2019     General Examination:   BP Marland Kitchen)  130/72 (BP Location: Left Arm, Patient Position: Sitting, Cuff Size: Large)   Pulse 71   Ht 6' (1.829 m)   Wt (!) 356 lb 6.4 oz (161.7 kg)   SpO2 93%   BMI 48.34 kg/m     Assessment/ Plan:   HYPOGONADOTROPIC HYPOGONADISM with symptoms of fatigue and low free testosterone at baseline  He has been symptomatic with fatigue and decreased libido  He has good results with clomiphene 3 days a week but his LH level has gone up significantly He still has some nonspecific fatigue and as before not always correlated with  testosterone levels  He can try adding AndroGel 1 pump on each arm along with reducing clomiphene to twice a week now Follow-up in 2 months  DIABETES with morbid obesity  A1c is slightly higher at 6.3 Has not been able to achieve any weight loss Also may benefit in multiple ways with weight loss especially with a GLP-1 drug He has a 80-monthsupply of Ozempic and he can start back on it Subsequently may consider Rybelsus as this may be better covered by insurance Meanwhile continue to work on diet and exercise regimen  FATIGUE: Likely multifactorial and not related to thyroid or hypogonadism Will reevaluate IGF-I since he may have potentially hypopituitarism  HYPOTHYROIDISM: Well regulated with 75 mcg levothyroxine  Follow-up in 3 months  Patient Instructions  Ozempic 38 clicks for 2 weeks then 1 mg weekly  Check blood sugars on waking up days a week  Also check blood sugars about 2 hours after meals and do this after different meals by rotation  Recommended blood sugar levels on waking up are 90-130 and about 2 hours after meal is 130-160  Please bring your blood sugar monitor to each visit, thank you  Androgel 1 pump on eah arm       AElayne Snare7/23/2021, 8:52 AM      Note: This office note was prepared with Dragon voice recognition system technology. Any transcriptional errors that result from this process are unintentional.     AElayne Snare7/23/2021, 8:52 AM

## 2020-06-09 NOTE — Patient Instructions (Addendum)
Ozempic 38 clicks for 2 weeks then 1 mg weekly  Check blood sugars on waking up days a week  Also check blood sugars about 2 hours after meals and do this after different meals by rotation  Recommended blood sugar levels on waking up are 90-130 and about 2 hours after meal is 130-160  Please bring your blood sugar monitor to each visit, thank you  Androgel 1 pump on eah arm

## 2020-06-11 LAB — INSULIN-LIKE GROWTH FACTOR: Insulin-Like GF-1: 85 ng/mL (ref 68–247)

## 2020-06-13 ENCOUNTER — Other Ambulatory Visit: Payer: Self-pay | Admitting: Neurology

## 2020-06-19 ENCOUNTER — Ambulatory Visit: Payer: BC Managed Care – PPO | Admitting: Neurology

## 2020-06-27 ENCOUNTER — Other Ambulatory Visit: Payer: Self-pay | Admitting: Internal Medicine

## 2020-06-27 DIAGNOSIS — F411 Generalized anxiety disorder: Secondary | ICD-10-CM

## 2020-07-02 ENCOUNTER — Other Ambulatory Visit: Payer: Self-pay | Admitting: Internal Medicine

## 2020-07-02 DIAGNOSIS — I10 Essential (primary) hypertension: Secondary | ICD-10-CM

## 2020-07-29 ENCOUNTER — Other Ambulatory Visit: Payer: Self-pay | Admitting: Internal Medicine

## 2020-07-29 DIAGNOSIS — E039 Hypothyroidism, unspecified: Secondary | ICD-10-CM

## 2020-07-29 DIAGNOSIS — E559 Vitamin D deficiency, unspecified: Secondary | ICD-10-CM

## 2020-08-15 ENCOUNTER — Other Ambulatory Visit: Payer: BC Managed Care – PPO

## 2020-08-18 ENCOUNTER — Ambulatory Visit: Payer: BC Managed Care – PPO | Admitting: Endocrinology

## 2020-08-20 ENCOUNTER — Other Ambulatory Visit: Payer: Self-pay | Admitting: Internal Medicine

## 2020-08-20 DIAGNOSIS — I739 Peripheral vascular disease, unspecified: Secondary | ICD-10-CM

## 2020-08-29 ENCOUNTER — Other Ambulatory Visit: Payer: Self-pay

## 2020-08-29 ENCOUNTER — Other Ambulatory Visit: Payer: Self-pay | Admitting: Internal Medicine

## 2020-08-29 ENCOUNTER — Other Ambulatory Visit: Payer: Self-pay | Admitting: Endocrinology

## 2020-08-29 ENCOUNTER — Encounter: Payer: Self-pay | Admitting: Internal Medicine

## 2020-08-29 ENCOUNTER — Other Ambulatory Visit (INDEPENDENT_AMBULATORY_CARE_PROVIDER_SITE_OTHER): Payer: BC Managed Care – PPO

## 2020-08-29 ENCOUNTER — Ambulatory Visit: Payer: BC Managed Care – PPO | Admitting: Internal Medicine

## 2020-08-29 VITALS — BP 128/82 | HR 95 | Temp 98.4°F | Resp 16 | Ht 72.0 in | Wt 354.0 lb

## 2020-08-29 DIAGNOSIS — F411 Generalized anxiety disorder: Secondary | ICD-10-CM

## 2020-08-29 DIAGNOSIS — Z23 Encounter for immunization: Secondary | ICD-10-CM

## 2020-08-29 DIAGNOSIS — E039 Hypothyroidism, unspecified: Secondary | ICD-10-CM

## 2020-08-29 DIAGNOSIS — F332 Major depressive disorder, recurrent severe without psychotic features: Secondary | ICD-10-CM

## 2020-08-29 DIAGNOSIS — E118 Type 2 diabetes mellitus with unspecified complications: Secondary | ICD-10-CM | POA: Diagnosis not present

## 2020-08-29 DIAGNOSIS — E1169 Type 2 diabetes mellitus with other specified complication: Secondary | ICD-10-CM

## 2020-08-29 DIAGNOSIS — I10 Essential (primary) hypertension: Secondary | ICD-10-CM

## 2020-08-29 DIAGNOSIS — E669 Obesity, unspecified: Secondary | ICD-10-CM

## 2020-08-29 DIAGNOSIS — E230 Hypopituitarism: Secondary | ICD-10-CM

## 2020-08-29 DIAGNOSIS — L989 Disorder of the skin and subcutaneous tissue, unspecified: Secondary | ICD-10-CM

## 2020-08-29 LAB — MICROALBUMIN / CREATININE URINE RATIO
Creatinine,U: 214.1 mg/dL
Microalb Creat Ratio: 1.9 mg/g (ref 0.0–30.0)
Microalb, Ur: 4.1 mg/dL — ABNORMAL HIGH (ref 0.0–1.9)

## 2020-08-29 LAB — BASIC METABOLIC PANEL
BUN: 17 mg/dL (ref 6–23)
CO2: 30 mEq/L (ref 19–32)
Calcium: 9.6 mg/dL (ref 8.4–10.5)
Chloride: 101 mEq/L (ref 96–112)
Creatinine, Ser: 0.98 mg/dL (ref 0.40–1.50)
GFR: 85.39 mL/min (ref >60.00)
Glucose, Bld: 134 mg/dL — ABNORMAL HIGH (ref 70–99)
Potassium: 3.6 mEq/L (ref 3.5–5.1)
Sodium: 139 mEq/L (ref 135–145)

## 2020-08-29 LAB — TESTOSTERONE: Testosterone: 405.32 ng/dL (ref 300.00–890.00)

## 2020-08-29 LAB — URINALYSIS, ROUTINE W REFLEX MICROSCOPIC
Bilirubin Urine: NEGATIVE
Hgb urine dipstick: NEGATIVE
Ketones, ur: NEGATIVE
Leukocytes,Ua: NEGATIVE
Nitrite: NEGATIVE
Specific Gravity, Urine: 1.025 (ref 1.000–1.030)
Total Protein, Urine: NEGATIVE
Urine Glucose: NEGATIVE
Urobilinogen, UA: 0.2 (ref 0.0–1.0)
pH: 6 (ref 5.0–8.0)

## 2020-08-29 LAB — CBC
HCT: 45.5 % (ref 39.0–52.0)
Hemoglobin: 15.4 g/dL (ref 13.0–17.0)
MCHC: 34 g/dL (ref 30.0–36.0)
MCV: 87.9 fl (ref 78.0–100.0)
Platelets: 218 10*3/uL (ref 150.0–400.0)
RBC: 5.17 Mil/uL (ref 4.22–5.81)
RDW: 13.8 % (ref 11.5–15.5)
WBC: 9.1 10*3/uL (ref 4.0–10.5)

## 2020-08-29 MED ORDER — VIIBRYD STARTER PACK 10 & 20 MG PO KIT: 1.0000 | PACK | Freq: Every day | ORAL | 0 refills | Status: DC

## 2020-08-29 MED ORDER — ALPRAZOLAM 0.5 MG PO TABS: 0.5000 mg | ORAL_TABLET | Freq: Three times a day (TID) | ORAL | 0 refills | Status: DC | PRN

## 2020-08-29 MED ORDER — TRIAMTERENE-HCTZ 37.5-25 MG PO TABS: 1.0000 | ORAL_TABLET | Freq: Every day | ORAL | 0 refills | Status: DC

## 2020-08-29 NOTE — Progress Notes (Signed)
Subjective:  Patient ID: Zachary Murray, male    DOB: 1966/08/20  Age: 54 y.o. MRN: [id number]  CC: Hypothyroidism, Hypertension, Diabetes, and Depression  This visit occurred during the SARS-CoV-2 public health emergency.  Safety protocols were in place, including screening questions prior to the visit, additional usage of staff PPE, and extensive cleaning of exam room while observing appropriate contact time as indicated for disinfecting solutions.    [name] presents for f/up - He complains of a several week history of worsening signs and symptoms of depression with insomnia, increased anxiety, anhedonia, apathy, and feeling hopeless/helpless.  He denies SI or HI.  Outpatient Medications Prior to Visit  Medication Sig Dispense Refill  . Barberry-Oreg Grape-Goldenseal (BERBERINE COMPLEX PO) Take 1 capsule by mouth daily.     . Blood Glucose Monitoring Suppl ([place]) w/Device KIT Use as instructed to check blood sugar once daily. 1 kit 0  . cilostazol (PLETAL) 100 MG tablet TAKE 1 TABLET(100 MG) BY MOUTH TWICE DAILY 180 tablet 0  . clomiPHENE (CLOMID) 50 MG tablet Take 1/2 tablet by mouth 3 times a week 20 tablet 1  . Glucosamine-Chondroit-Vit C-Mn (GLUCOSAMINE 1500 COMPLEX PO) Take 1 capsule by mouth daily.     Marland Kitchen glucose blood test strip Use onetouch verio flex test strip as instructed to check blood sugar once daily. 100 each 3  . Insulin Pen Needle (RELION PEN NEEDLES) 31G X 6 MM MISC Use as needed for insulin injection. 100 each 3  . Lancets (ONETOUCH ULTRASOFT) lancets Use onetouch delica lancet to check blood sugar once daily. 100 each 3  . levothyroxine (SYNTHROID) 75 MCG tablet TAKE 1 TABLET(75 MCG) BY MOUTH DAILY 90 tablet 0  . primidone (MYSOLINE) 50 MG tablet Take 1 tablet (50 mg total) by mouth at bedtime. 90 tablet 1  . propranolol (INDERAL) 40 MG tablet TAKE 1 TABLET(40 MG) BY MOUTH THREE TIMES DAILY 270 tablet 0  . Testosterone (ANDROGEL PUMP)  20.25 MG/ACT (1.62%) GEL Apply 1 pump on each upper arm daily 75 g 2  . Vitamin D, Ergocalciferol, (DRISDOL) 1.25 MG (50000 UNIT) CAPS capsule TAKE 1 CAPSULE BY MOUTH 1 TIME A WEEK 12 capsule 0  . ALPRAZolam (XANAX) 0.5 MG tablet TAKE 1 TABLET(0.5 MG) BY MOUTH THREE TIMES DAILY AS NEEDED FOR ANXIETY 270 tablet 0  . triamterene-hydrochlorothiazide (MAXZIDE-25) 37.5-25 MG tablet TAKE 1 TABLET BY MOUTH DAILY 90 tablet 0  . cefUROXime (CEFTIN) 250 MG tablet Take 1 tablet (250 mg total) by mouth 2 (two) times daily with a meal. 20 tablet 0   No facility-administered medications prior to visit.    ROS Review of Systems  Constitutional: Positive for fatigue. Negative for appetite change, chills, diaphoresis and unexpected weight change.  HENT: Negative.  Negative for trouble swallowing.   Eyes: Negative for visual disturbance.  Respiratory: Negative for cough, chest tightness, shortness of breath and wheezing.   Cardiovascular: Negative for chest pain, palpitations and leg swelling.  Gastrointestinal: Negative for abdominal pain, constipation, diarrhea, nausea and vomiting.  Endocrine: Negative for cold intolerance, heat intolerance, polydipsia, polyphagia and polyuria.  Genitourinary: Negative.  Negative for difficulty urinating and dysuria.  Musculoskeletal: Negative.  Negative for arthralgias and myalgias.  Skin: Negative.        He complains of a skin lesion over his left upper abdomen.  Neurological: Negative.  Negative for weakness, light-headedness and headaches.  Hematological: Negative for adenopathy. Does not bruise/bleed easily.  Psychiatric/Behavioral: Positive for dysphoric  mood and sleep disturbance. Negative for agitation, behavioral problems, confusion, decreased concentration, self-injury and suicidal ideas. The patient is nervous/anxious.     Objective:  BP 128/82   Pulse 95   Temp 98.4 F (36.9 C) (Oral)   Resp 16   Ht 6' (1.829 m)   Wt (!) 354 lb (160.6 kg)   SpO2 97%    BMI 48.01 kg/m   BP Readings from Last 3 Encounters:  08/31/20 108/68  08/29/20 128/82  06/09/20 (!) 130/72    Wt Readings from Last 3 Encounters:  08/31/20 (!) 353 lb 9.6 oz (160.4 kg)  08/29/20 (!) 354 lb (160.6 kg)  06/09/20 (!) 356 lb 6.4 oz (161.7 kg)    Physical Exam Vitals reviewed.  Constitutional:      Appearance: He is obese.  HENT:     Nose: Nose normal.     Mouth/Throat:     Mouth: Mucous membranes are moist.  Eyes:     General: No scleral icterus.    Conjunctiva/sclera: Conjunctivae normal.  Cardiovascular:     Rate and Rhythm: Normal rate and regular rhythm.     Heart sounds: No murmur heard.   Pulmonary:     Effort: Pulmonary effort is normal.     Breath sounds: No stridor. No wheezing, rhonchi or rales.  Abdominal:     General: Abdomen is protuberant. Bowel sounds are normal. There is no distension.     Palpations: Abdomen is soft. There is no hepatomegaly, splenomegaly or mass.       Comments: LUQ/flank there is a 3 mm crust papular lesion  Musculoskeletal:        General: Normal range of motion.     Cervical back: Neck supple.     Right lower leg: No edema.     Left lower leg: No edema.  Lymphadenopathy:     Cervical: No cervical adenopathy.  Skin:    General: Skin is warm and dry.     Findings: Lesion present. No rash.  Neurological:     General: No focal deficit present.     Mental Status: Mental status is at baseline.  Psychiatric:        Attention and Perception: Attention and perception normal. He is attentive.        Mood and Affect: Mood is anxious and depressed. Affect is flat. Affect is not tearful.        Speech: Speech normal. Speech is not delayed or tangential.        Behavior: Behavior is slowed and withdrawn. Behavior is not agitated, aggressive or hyperactive.        Thought Content: Thought content normal. Thought content is not paranoid or delusional. Thought content does not include homicidal or suicidal ideation.         Cognition and Memory: Cognition normal.        Judgment: Judgment normal.     Lab Results  Component Value Date   WBC 9.1 08/29/2020   HGB 15.4 08/29/2020   HCT 45.5 08/29/2020   PLT 218.0 08/29/2020   GLUCOSE 134 (H) 08/29/2020   CHOL 121 12/27/2019   TRIG 48.0 12/27/2019   HDL 48.70 12/27/2019   LDLCALC 63 12/27/2019   ALT 17 03/14/2020   AST 21 03/14/2020   NA 139 08/29/2020   K 3.6 08/29/2020   CL 101 08/29/2020   CREATININE 0.98 08/29/2020   BUN 17 08/29/2020   CO2 30 08/29/2020   TSH 2.10 06/07/2020   PSA 0.55 12/27/2019  HGBA1C 6.6 (H) 06/07/2020   MICROALBUR 4.1 (H) 08/29/2020    No results found.  Assessment & Plan:   Zachary Murray was seen today for hypothyroidism, hypertension, diabetes and depression.  Diagnoses and all orders for this visit:  Acquired hypothyroidism- His recent TSH was in the normal range.  He will stay on the current dose of levothyroxine.  Type II diabetes mellitus with manifestations ([place])- His blood sugars are adequately well controlled. -     BASIC METABOLIC PANEL WITH GFR; Future -     Microalbumin / creatinine urine ratio; Future  Severe episode of recurrent major depressive disorder, without psychotic features ([place])- Will start vilazodone.  Will slowly increase the dose over time. -     Vilazodone HCl (VIIBRYD STARTER PACK) 10 & 20 MG KIT; Take 1 tablet by mouth daily.  Need for HPV vaccine -     HPV 9-valent vaccine,Recombinat  Need for vaccination for Strep pneumoniae -     Pneumococcal conjugate vaccine 13-valent  Essential hypertension, benign- His blood pressure is adequately well controlled. -     BASIC METABOLIC PANEL WITH GFR; Future -     triamterene-hydrochlorothiazide (MAXZIDE-25) 37.5-25 MG tablet; Take 1 tablet by mouth daily.  GAD (generalized anxiety disorder) -     ALPRAZolam (XANAX) 0.5 MG tablet; Take 1 tablet (0.5 mg total) by mouth 3 (three) times daily as needed for anxiety.  Changing skin lesion -      Ambulatory referral to Dermatology   I have discontinued Aaron Mose. [place] "Ken"'s cefUROXime. I have also changed his ALPRAZolam. Additionally, I am having him start on [employer]. Lastly, I am having him maintain his Insulin Pen Needle, onetouch ultrasoft, glucose blood, OneTouch Verio Flex System, primidone, Barberry-Oreg Grape-Goldenseal (BERBERINE COMPLEX PO), Glucosamine-Chondroit-Vit C-Mn (GLUCOSAMINE 1500 COMPLEX PO), clomiPHENE, Testosterone, propranolol, Vitamin D (Ergocalciferol), levothyroxine, cilostazol, and triamterene-hydrochlorothiazide.  Meds ordered this encounter  Medications  . Vilazodone HCl (VIIBRYD STARTER PACK) 10 & 20 MG KIT    Sig: Take 1 tablet by mouth daily.    Dispense:  1 kit    Refill:  0  . ALPRAZolam (XANAX) 0.5 MG tablet    Sig: Take 1 tablet (0.5 mg total) by mouth 3 (three) times daily as needed for anxiety.    Dispense:  270 tablet    Refill:  0  . triamterene-hydrochlorothiazide (MAXZIDE-25) 37.5-25 MG tablet    Sig: Take 1 tablet by mouth daily.    Dispense:  90 tablet    Refill:  0   I spent 50 minutes in preparing to see the patient by review of recent labs, imaging and procedures, obtaining and reviewing separately obtained history, communicating with the patient and family or caregiver, ordering medications, tests or procedures, and documenting clinical information in the EHR including the differential Dx, treatment, and any further evaluation and other management of 1. Acquired hypothyroidism 2. Type II diabetes mellitus with manifestations ([place]) 3. Severe episode of recurrent major depressive disorder, without psychotic features ([place]) 4. Essential hypertension, benign 6. GAD (generalized anxiety disorder) 7. Changing skin lesion     Follow-up: Return in about 3 months (around 11/29/2020).  Scarlette Calico, MD

## 2020-08-29 NOTE — Patient Instructions (Signed)
Major Depressive Disorder, Adult Major depressive disorder (MDD) is a mental health condition. It may also be called clinical depression or unipolar depression. MDD usually causes feelings of sadness, hopelessness, or helplessness. MDD can also cause physical symptoms. It can interfere with work, school, relationships, and other everyday activities. MDD may be mild, moderate, or severe. It may occur once (single episode major depressive disorder) or it may occur multiple times (recurrent major depressive disorder). What are the causes? The exact cause of this condition is not known. MDD is most likely caused by a combination of things, which may include:  Genetic factors. These are traits that are passed along from parent to child.  Individual factors. Your personality, your behavior, and the way you handle your thoughts and feelings may contribute to MDD. This includes personality traits and behaviors learned from others.  Physical factors, such as: ? Differences in the part of your brain that controls emotion. This part of your brain may be different than it is in people who do not have MDD. ? Long-term (chronic) medical or psychiatric illnesses.  Social factors. Traumatic experiences or major life changes may play a role in the development of MDD. What increases the risk? This condition is more likely to develop in women. The following factors may also make you more likely to develop MDD:  A family history of depression.  Troubled family relationships.  Abnormally low levels of certain brain chemicals.  Traumatic events in childhood, especially abuse or the loss of a parent.  Being under a lot of stress, or long-term stress, especially from upsetting life experiences or losses.  A history of: ? Chronic physical illness. ? Other mental health disorders. ? Substance abuse.  Poor living conditions.  Experiencing social exclusion or discrimination on a regular basis. What are the  signs or symptoms? The main symptoms of MDD typically include:  Constant depressed or irritable mood.  Loss of interest in things and activities. MDD symptoms may also include:  Sleeping or eating too much or too little.  Unexplained weight change.  Fatigue or low energy.  Feelings of worthlessness or guilt.  Difficulty thinking clearly or making decisions.  Thoughts of suicide or of harming others.  Physical agitation or weakness.  Isolation. Severe cases of MDD may also occur with other symptoms, such as:  Delusions or hallucinations, in which you imagine things that are not real (psychotic depression).  Low-level depression that lasts at least a year (chronic depression or persistent depressive disorder).  Extreme sadness and hopelessness (melancholic depression).  Trouble speaking and moving (catatonic depression). How is this diagnosed? This condition may be diagnosed based on:  Your symptoms.  Your medical history, including your mental health history. This may involve tests to evaluate your mental health. You may be asked questions about your lifestyle, including any drug and alcohol use, and how long you have had symptoms of MDD.  A physical exam.  Blood tests to rule out other conditions. You must have a depressed mood and at least four other MDD symptoms most of the day, nearly every day in the same 2-week timeframe before your health care provider can confirm a diagnosis of MDD. How is this treated? This condition is usually treated by mental health professionals, such as psychologists, psychiatrists, and clinical social workers. You may need more than one type of treatment. Treatment may include:  Psychotherapy. This is also called talk therapy or counseling. Types of psychotherapy include: ? Cognitive behavioral therapy (CBT). This type of therapy   teaches you to recognize unhealthy feelings, thoughts, and behaviors, and replace them with positive thoughts  and actions. ? Interpersonal therapy (IPT). This helps you to improve the way you relate to and communicate with others. ? Family therapy. This treatment includes members of your family.  Medicine to treat anxiety and depression, or to help you control certain emotions and behaviors.  Lifestyle changes, such as: ? Limiting alcohol and drug use. ? Exercising regularly. ? Getting plenty of sleep. ? Making healthy eating choices. ? Spending more time outdoors.  Treatments involving stimulation of the brain can be used in situations with extremely severe symptoms, or when medicine or other therapies do not work over time. These treatments include electroconvulsive therapy, transcranial magnetic stimulation, and vagal nerve stimulation. Follow these instructions at home: Activity  Return to your normal activities as told by your health care provider.  Exercise regularly and spend time outdoors as told by your health care provider. General instructions  Take over-the-counter and prescription medicines only as told by your health care provider.  Do not drink alcohol. If you drink alcohol, limit your alcohol intake to no more than 1 drink a day for nonpregnant women and 2 drinks a day for men. One drink equals 12 oz of beer, 5 oz of wine, or 1 oz of hard liquor. Alcohol can affect any antidepressant medicines you are taking. Talk to your health care provider about your alcohol use.  Eat a healthy diet and get plenty of sleep.  Find activities that you enjoy doing, and make time to do them.  Consider joining a support group. Your health care provider may be able to recommend a support group.  Keep all follow-up visits as told by your health care provider. This is important. Where to find more information National Alliance on Mental Illness  www.nami.org [place] National Institute of Mental Health  www.nimh.nih.gov National Suicide Prevention Lifeline  1-800-273-TALK (8255). This is  free, 24-hour help. Contact a health care provider if:  Your symptoms get worse.  You develop new symptoms. Get help right away if:  You self-harm.  You have serious thoughts about hurting yourself or others.  You see, hear, taste, smell, or feel things that are not present (hallucinate). This information is not intended to replace advice given to you by your health care provider. Make sure you discuss any questions you have with your health care provider. Document Revised: 10/17/2017 Document Reviewed: 05/15/2016 Elsevier Patient Education  2020 Elsevier Inc.  

## 2020-08-30 DIAGNOSIS — L989 Disorder of the skin and subcutaneous tissue, unspecified: Secondary | ICD-10-CM | POA: Insufficient documentation

## 2020-08-30 DIAGNOSIS — Z23 Encounter for immunization: Secondary | ICD-10-CM | POA: Insufficient documentation

## 2020-08-30 NOTE — Progress Notes (Signed)
Patient ID: Zachary Murray, male   DOB: November 12, 1966, 54 y.o.   MRN: [id number]              Chief complaint: Endocrinology follow-up  History of Present Illness  PROBLEM 1  Hypogonadismwas first diagnosed in 11/18  Since about 2016 or so he has had complaints offatigue He also feels malaise and listless, mostly in the afternoons and did not feel like doing anything at that time Also has some decreased libido  The diagnosis was confirmed by the low free T level of 6.6 done in 5/19 [facility] was inappropriately low at 6.3  He was on a trial of CLOMIPHENE 25 mg 3 times a week With this his energy level and libido were improving but only partially This is despite his testosterone level being as high as 661, his LH had gone up to 20  Because of the lack of adequate symptomatic improvement he is taking AndroGel since 9/19 Appears to be requiring relatively large doses of AndroGel up to 6 pumps a day  With testosterone gel he was feeling significantly better with his energy level overall His dose has been adjusted periodic However because of his hemoglobin going up to 18.3 in 03/2019 he was told to reduce the dose to 4 pumps daily He was taking 5 pumps a day on the AndroGel in November when he was seen but because of fatigue and testosterone level of only 212 he was tried on [place] but he could not continue this because of cost  RECENT HISTORY: After his stopping Xyosted his testosterone level was still  at 297  In 2021 he had requested trying clomiphene because of fatigue  With this his energy level did improve and testosterone level was 509  Subsequently however he has had  decreased motivation level and fatigue again Since his LH level had gone up he was started back on AndroGel but he is taking only 1 pump a day instead of 2 pumps daily as prescribed He uses this every evening Clomiphene dose is now twice a week  Previously his hemoglobin has been as high as 18 but is now consistently  normal  Testosterone is 405 compared to 509  Lab Results  Component Value Date   TESTOSTERONE 405.32 08/29/2020   TESTOSTERONE 509.06 06/07/2020   TESTOSTERONE 434.60 03/14/2020   TESTOSTERONE 297.00 (L) 12/09/2019    Prolactin level: 5.8  Lab Results  Component Value Date   LH 19.08 (H) 06/07/2020   Lab Results  Component Value Date   HGB 15.4 08/29/2020    PROBLEM 2: DIABETES:  Diagnosis date: ?  2016  Previous history:  Although he was diagnosed to have diabetes in 2016 he was not able to tolerate metformin that was previously prescribed Also was tried on [place] in 2017 and 2018 with some improved control He was tried on Bydureon in 06/2017 and continued until mil 3/19 d He was started on [place] insulin in 01/2018 by his PCP when his A1c was 7.8 and fasting glucose 165 Insulin was stopped in 5/19 since his blood sugar was averaging only 92 on his freestyle sensor at home  Recent history:     Non-insulin hypoglycemic drugs: None    His most recent A1c is 6.6 compared to 6.3  Current management: Ozempic 1 mg weekly   Although he did start Ozempic he did not take it for 3 weeks when he was out of town  Also not checking blood sugars much at all  Blood sugars are generally better at home with postprandial reading not more than about 160-170  However he thinks his morning sugars are in the 130s  He has been fairly active  However this time he has not lost weight  Nonfasting glucose 134       Monitors blood glucose:   One Touch Verio meter         Blood Glucose readings by recall as above  Previously:  PRE-MEAL Fasting Lunch Dinner Bedtime Overall  Glucose range:  95-146  143, 186    95-202  Mean/median:    128   142   POST-MEAL PC Breakfast PC Lunch PC Dinner  Glucose range:  166  116-202  153  Mean/median:       Side effects from medications:  Diarrhea from metformin         Meals:  Usually low-fat                     Dietician visit:   Never  Weight control:  Wt Readings from Last 3 Encounters:  08/31/20 (!) 353 lb 9.6 oz (160.4 kg)  08/29/20 (!) 354 lb (160.6 kg)  06/09/20 (!) 356 lb 6.4 oz (161.7 kg)   Lab Results  Component Value Date   HGBA1C 6.6 (H) 06/07/2020   HGBA1C 6.3 03/14/2020   HGBA1C 5.9 12/27/2019   Lab Results  Component Value Date   MICROALBUR 4.1 (H) 08/29/2020   LDLCALC 63 12/27/2019   CREATININE 0.98 08/29/2020    HYPOTHYROIDISM:  He was previously given levothyroxine for high TSH in 12/16 but later stopped because of improved TSH  Because of his fatigue he had been started on levothyroxine in 3/19 with only slight improvement in his fatigue  He has been taking 75 mcg of levothyroxine and has been on the same dose since July 2019 Had not had any improvement in his fatigue with increasing the dose  TSH and free T4 consistently normal as below     Lab Results  Component Value Date   TSH 2.10 06/07/2020   TSH 2.43 12/27/2019   TSH 1.99 09/21/2019   FREET4 1.16 06/07/2020   FREET4 1.41 09/21/2019   FREET4 1.07 03/29/2019        Allergies as of 08/31/2020      Reactions   Metformin And Related Diarrhea   Wellbutrin [bupropion] Other (See Comments)   tremors   Lisinopril Cough      Medication List       Accurate as of August 31, 2020  8:16 AM. If you have any questions, ask your nurse or doctor.        ALPRAZolam 0.5 MG tablet Commonly known as: XANAX Take 1 tablet (0.5 mg total) by mouth 3 (three) times daily as needed for anxiety.   BERBERINE COMPLEX PO Take 1 capsule by mouth daily.   cilostazol 100 MG tablet Commonly known as: PLETAL TAKE 1 TABLET(100 MG) BY MOUTH TWICE DAILY   clomiPHENE 50 MG tablet Commonly known as: CLOMID Take 1/2 tablet by mouth 3 times a week   GLUCOSAMINE 1500 COMPLEX PO Take 1 capsule by mouth daily.   glucose blood test strip Use onetouch verio flex test strip as instructed to check blood sugar  once daily.   Insulin Pen Needle 31G X 6 MM Misc Commonly known as: ReliOn Pen Needles Use as needed for insulin injection.   levothyroxine 75 MCG tablet Commonly known as: SYNTHROID TAKE 1 TABLET(75 MCG) BY MOUTH  DAILY   onetouch ultrasoft lancets Use onetouch delica lancet to check blood sugar once daily.   OneTouch Verio Flex System w/Device Kit Use as instructed to check blood sugar once daily.   primidone 50 MG tablet Commonly known as: MYSOLINE Take 1 tablet (50 mg total) by mouth at bedtime.   propranolol 40 MG tablet Commonly known as: INDERAL TAKE 1 TABLET(40 MG) BY MOUTH THREE TIMES DAILY   Testosterone 20.25 MG/ACT (1.62%) Gel Commonly known as: AndroGel Pump Apply 1 pump on each upper arm daily   triamterene-hydrochlorothiazide 37.5-25 MG tablet Commonly known as: MAXZIDE-25 Take 1 tablet by mouth daily.   Viibryd Starter Pack 10 & 20 MG Kit Generic drug: Vilazodone HCl Take 1 tablet by mouth daily.   Vitamin D (Ergocalciferol) 1.25 MG (50000 UNIT) Caps capsule Commonly known as: DRISDOL TAKE 1 CAPSULE BY MOUTH 1 TIME A WEEK       Allergies:  Allergies  Allergen Reactions   Metformin And Related Diarrhea   Wellbutrin [Bupropion] Other (See Comments)    tremors   Lisinopril Cough    Past Medical History:  Diagnosis Date   Hypertension    Obesity    OSA on CPAP    Type II diabetes mellitus with manifestations [facility])     Past Surgical History:  Procedure Laterality Date   BIOPSY  08/31/2018   Procedure: BIOPSY;  Surgeon: Mauri Pole, MD;  Location: [facility];  Service: Endoscopy;;   COLONOSCOPY WITH PROPOFOL N/A 11/12/2016   Procedure: COLONOSCOPY WITH PROPOFOL;  Surgeon: Mauri Pole, MD;  Location: [facility];  Service: Endoscopy;  Laterality: N/A;   COLONOSCOPY WITH PROPOFOL N/A 08/31/2018   Procedure: COLONOSCOPY WITH PROPOFOL;  Surgeon: Mauri Pole, MD;  Location: [facility];  Service: Endoscopy;   Laterality: N/A;   POLYPECTOMY  08/31/2018   Procedure: POLYPECTOMY;  Surgeon: Mauri Pole, MD;  Location: [facility];  Service: Endoscopy;;   WISDOM TOOTH EXTRACTION      Family History  Problem Relation Age of Onset   Colon cancer Father    Hypertension Father    Thyroid cancer Mother    Colon polyps Mother    Thyroid disease Mother    Thyroid disease Sister    Thyroid disease Maternal Grandmother    Alcohol abuse Neg Hx    COPD Neg Hx    Diabetes Neg Hx    Early death Neg Hx    Hearing loss Neg Hx    Heart disease Neg Hx    Hyperlipidemia Neg Hx    Kidney disease Neg Hx    Stroke Neg Hx    Esophageal cancer Neg Hx     Social History:  reports that he has never smoked. He has quit using smokeless tobacco.  His smokeless tobacco use included chew. He reports that he does not drink alcohol and does not use drugs.  Review of Systems    LIPIDS: LDL below 100, not on a statin drug Followed by PCP  Lab Results  Component Value Date   CHOL 121 12/27/2019   HDL 48.70 12/27/2019   LDLCALC 63 12/27/2019   TRIG 48.0 12/27/2019   CHOLHDL 2 12/27/2019     General Examination:   BP 108/68 (BP Location: Left Arm, Patient Position: Sitting, Cuff Size: Large)    Pulse 90    Ht 5' 11.5" (1.816 m)    Wt (!) 353 lb 9.6 oz (160.4 kg)    SpO2 91%    BMI 48.63  kg/m     Assessment/ Plan:   HYPOGONADOTROPIC HYPOGONADISM with symptoms of fatigue and low free testosterone at baseline  He has been symptomatic with fatigue and decreased libido  Currently on clomiphene 2 days a week with AndroGel 1 pump daily He still has  nonspecific fatigue and as before not correlated with testosterone levels However testosterone level is slightly lower than previous visit  He can try now using AndroGel 1 pump on each arm along with continuing clomiphene to twice a week now Follow-up in 2 months  DIABETES with morbid obesity  He will take Ozempic consistently, 1  mg weekly New prescription sent To try and check more readings consistently after meals May consider adding low-dose Metformin  FATIGUE: Likely multifactorial, he thinks he is having difficulties with sleep; not related to hypothyroidism or hypogonadism  Also he is starting on Viibryd    There are no Patient Instructions on file for this visit.  Elayne Snare 08/31/2020, 8:16 AM      Note: This office note was prepared with Dragon voice recognition system technology. Any transcriptional errors that result from this process are unintentional.     Elayne Snare 08/31/2020, 8:16 AM

## 2020-08-31 ENCOUNTER — Other Ambulatory Visit: Payer: Self-pay

## 2020-08-31 ENCOUNTER — Ambulatory Visit: Payer: BC Managed Care – PPO | Admitting: Endocrinology

## 2020-08-31 VITALS — BP 108/68 | HR 90 | Ht 71.5 in | Wt 353.6 lb

## 2020-08-31 DIAGNOSIS — E669 Obesity, unspecified: Secondary | ICD-10-CM | POA: Diagnosis not present

## 2020-08-31 DIAGNOSIS — E230 Hypopituitarism: Secondary | ICD-10-CM

## 2020-08-31 DIAGNOSIS — R5382 Chronic fatigue, unspecified: Secondary | ICD-10-CM | POA: Diagnosis not present

## 2020-08-31 DIAGNOSIS — E1169 Type 2 diabetes mellitus with other specified complication: Secondary | ICD-10-CM | POA: Diagnosis not present

## 2020-08-31 MED ORDER — OZEMPIC (1 MG/DOSE) 4 MG/3ML ~~LOC~~ SOPN: 1.0000 mg | PEN_INJECTOR | SUBCUTANEOUS | 2 refills | Status: DC

## 2020-09-06 ENCOUNTER — Other Ambulatory Visit: Payer: Self-pay | Admitting: Internal Medicine

## 2020-09-06 ENCOUNTER — Encounter: Payer: Self-pay | Admitting: Internal Medicine

## 2020-09-06 DIAGNOSIS — G250 Essential tremor: Secondary | ICD-10-CM

## 2020-09-06 MED ORDER — PROPRANOLOL HCL 40 MG PO TABS: ORAL_TABLET | ORAL | 1 refills | Status: DC

## 2020-09-07 ENCOUNTER — Other Ambulatory Visit: Payer: Self-pay | Admitting: Internal Medicine

## 2020-09-07 DIAGNOSIS — E559 Vitamin D deficiency, unspecified: Secondary | ICD-10-CM

## 2020-09-10 ENCOUNTER — Encounter: Payer: Self-pay | Admitting: Internal Medicine

## 2020-09-11 ENCOUNTER — Other Ambulatory Visit: Payer: Self-pay | Admitting: Internal Medicine

## 2020-09-11 DIAGNOSIS — D485 Neoplasm of uncertain behavior of skin: Secondary | ICD-10-CM | POA: Diagnosis not present

## 2020-09-11 DIAGNOSIS — F332 Major depressive disorder, recurrent severe without psychotic features: Secondary | ICD-10-CM

## 2020-09-11 MED ORDER — VIIBRYD 20 MG PO TABS: 1.0000 | ORAL_TABLET | Freq: Every day | ORAL | 1 refills | Status: DC

## 2020-09-11 NOTE — Telephone Encounter (Signed)
Key: [id number]  PA has been approved per CoverMyMeds.

## 2020-10-03 ENCOUNTER — Other Ambulatory Visit: Payer: Self-pay | Admitting: Endocrinology

## 2020-10-04 ENCOUNTER — Other Ambulatory Visit: Payer: Self-pay

## 2020-10-04 ENCOUNTER — Ambulatory Visit: Payer: BC Managed Care – PPO | Admitting: Internal Medicine

## 2020-10-04 ENCOUNTER — Encounter: Payer: Self-pay | Admitting: Internal Medicine

## 2020-10-04 VITALS — BP 128/84 | HR 84 | Temp 98.3°F | Resp 16 | Ht 71.5 in | Wt 350.0 lb

## 2020-10-04 DIAGNOSIS — H00013 Hordeolum externum right eye, unspecified eyelid: Secondary | ICD-10-CM | POA: Insufficient documentation

## 2020-10-04 DIAGNOSIS — I10 Essential (primary) hypertension: Secondary | ICD-10-CM

## 2020-10-04 MED ORDER — SULFAMETHOXAZOLE-TRIMETHOPRIM 800-160 MG PO TABS: 1.0000 | ORAL_TABLET | Freq: Three times a day (TID) | ORAL | 0 refills | Status: AC

## 2020-10-04 NOTE — Progress Notes (Signed)
Subjective:  Patient ID: Zachary Murray, male    DOB: Mar 30, 1966  Age: 54 y.o. MRN: [id number]  CC: Diabetes and Hypertension  This visit occurred during the SARS-CoV-2 public health emergency.  Safety protocols were in place, including screening questions prior to the visit, additional usage of staff PPE, and extensive cleaning of exam room while observing appropriate contact time as indicated for disinfecting solutions.    [name] presents for f/up - He complains of a 2 week hx of painful, red nodules over his right upper and lower eyelids. He has been using tobradex with no improvement. The lesion over the upper eyelid has become a pustule.  Outpatient Medications Prior to Visit  Medication Sig Dispense Refill  . ALPRAZolam (XANAX) 0.5 MG tablet Take 1 tablet (0.5 mg total) by mouth 3 (three) times daily as needed for anxiety. 270 tablet 0  . Barberry-Oreg Grape-Goldenseal (BERBERINE COMPLEX PO) Take 1 capsule by mouth daily.     . Blood Glucose Monitoring Suppl ([place]) w/Device KIT Use as instructed to check blood sugar once daily. 1 kit 0  . cilostazol (PLETAL) 100 MG tablet TAKE 1 TABLET(100 MG) BY MOUTH TWICE DAILY 180 tablet 0  . clomiPHENE (CLOMID) 50 MG tablet TAKE 1/2 TABLET BY MOUTH 3 TIMES A WEEK 20 tablet 1  . Glucosamine-Chondroit-Vit C-Mn (GLUCOSAMINE 1500 COMPLEX PO) Take 1 capsule by mouth daily.     Marland Kitchen glucose blood test strip Use onetouch verio flex test strip as instructed to check blood sugar once daily. 100 each 3  . Insulin Pen Needle (RELION PEN NEEDLES) 31G X 6 MM MISC Use as needed for insulin injection. 100 each 3  . Lancets (ONETOUCH ULTRASOFT) lancets Use onetouch delica lancet to check blood sugar once daily. 100 each 3  . levothyroxine (SYNTHROID) 75 MCG tablet TAKE 1 TABLET(75 MCG) BY MOUTH DAILY 90 tablet 0  . primidone (MYSOLINE) 50 MG tablet Take 1 tablet (50 mg total) by mouth at bedtime. 90 tablet 1  . propranolol (INDERAL) 40  MG tablet TAKE 1 TABLET(40 MG) BY MOUTH THREE TIMES DAILY 270 tablet 1  . Semaglutide, 1 MG/DOSE, (OZEMPIC, 1 MG/DOSE,) 4 MG/3ML SOPN Inject 1 mg into the skin once a week. 3 mL 2  . Testosterone (ANDROGEL PUMP) 20.25 MG/ACT (1.62%) GEL Apply 1 pump on each upper arm daily 75 g 2  . Vilazodone HCl (VIIBRYD) 20 MG TABS Take 1 tablet (20 mg total) by mouth daily with breakfast. 90 tablet 1  . Vitamin D, Ergocalciferol, (DRISDOL) 1.25 MG (50000 UNIT) CAPS capsule TAKE 1 CAPSULE BY MOUTH 1 TIME A WEEK 12 capsule 0  . triamterene-hydrochlorothiazide (MAXZIDE-25) 37.5-25 MG tablet Take 1 tablet by mouth daily. 90 tablet 0   No facility-administered medications prior to visit.    ROS Review of Systems  Constitutional: Negative.   HENT: Negative.   Eyes: Negative for pain, discharge and visual disturbance.  Respiratory: Negative for cough, shortness of breath and wheezing.   Cardiovascular: Negative for chest pain, palpitations and leg swelling.  Gastrointestinal: Negative for abdominal pain, constipation, diarrhea, nausea and vomiting.  Endocrine: Negative.   Genitourinary: Negative.  Negative for difficulty urinating.  Musculoskeletal: Negative for arthralgias and myalgias.  Skin: Negative.   Neurological: Positive for tremors. Negative for weakness.  Hematological: Negative for adenopathy. Does not bruise/bleed easily.  Psychiatric/Behavioral: Negative.     Objective:  BP 128/84   Pulse 84   Temp 98.3 F (36.8 C) (Oral)  Resp 16   Ht 5' 11.5" (1.816 m)   Wt (!) 350 lb (158.8 kg)   SpO2 96%   BMI 48.13 kg/m   BP Readings from Last 3 Encounters:  10/04/20 128/84  08/31/20 108/68  08/29/20 128/82    Wt Readings from Last 3 Encounters:  10/04/20 (!) 350 lb (158.8 kg)  08/31/20 (!) 353 lb 9.6 oz (160.4 kg)  08/29/20 (!) 354 lb (160.6 kg)    Physical Exam Vitals reviewed.  HENT:     Nose: Nose normal.     Mouth/Throat:     Mouth: Mucous membranes are moist.  Eyes:      General: No scleral icterus.       Right eye: Hordeolum present. No discharge.        Left eye: No discharge or hordeolum.     Conjunctiva/sclera: Conjunctivae normal.     Right eye: Right conjunctiva is not injected. No exudate.    Left eye: Left conjunctiva is not injected. No exudate.  Cardiovascular:     Rate and Rhythm: Normal rate and regular rhythm.     Heart sounds: No murmur heard.   Pulmonary:     Effort: Pulmonary effort is normal.     Breath sounds: No stridor. No wheezing, rhonchi or rales.  Abdominal:     General: Abdomen is protuberant. There is no distension.     Palpations: Abdomen is soft. There is no hepatomegaly, splenomegaly or mass.     Tenderness: There is no abdominal tenderness.  Musculoskeletal:        General: Normal range of motion.     Cervical back: Neck supple.  Lymphadenopathy:     Cervical: No cervical adenopathy.  Skin:    General: Skin is warm and dry.  Neurological:     General: No focal deficit present.  Psychiatric:        Mood and Affect: Mood normal.     Lab Results  Component Value Date   WBC 9.1 08/29/2020   HGB 15.4 08/29/2020   HCT 45.5 08/29/2020   PLT 218.0 08/29/2020   GLUCOSE 134 (H) 08/29/2020   CHOL 121 12/27/2019   TRIG 48.0 12/27/2019   HDL 48.70 12/27/2019   LDLCALC 63 12/27/2019   ALT 17 03/14/2020   AST 21 03/14/2020   NA 139 08/29/2020   K 3.6 08/29/2020   CL 101 08/29/2020   CREATININE 0.98 08/29/2020   BUN 17 08/29/2020   CO2 30 08/29/2020   TSH 2.10 06/07/2020   PSA 0.55 12/27/2019   HGBA1C 6.6 (H) 06/07/2020   MICROALBUR 4.1 (H) 08/29/2020    No results found.  Assessment & Plan:   Zachary Murray was seen today for diabetes and hypertension.  Diagnoses and all orders for this visit:  Hordeolum externum of right eye, unspecified eyelid- This is most likely a staph infection. WIll treat with SMX/TMP. -     sulfamethoxazole-trimethoprim (BACTRIM DS) 800-160 MG tablet; Take 1 tablet by mouth in the  morning, at noon, and at bedtime for 10 days.  Essential hypertension, benign- His BP is adequately well controlled -     triamterene-hydrochlorothiazide (MAXZIDE-25) 37.5-25 MG tablet; Take 1 tablet by mouth daily.   I am having Zachary Murray. Zachary Murray "Yvone Neu" start on sulfamethoxazole-trimethoprim. I am also having him maintain his Insulin Pen Needle, onetouch ultrasoft, glucose blood, OneTouch Verio Flex System, primidone, Barberry-Oreg Grape-Goldenseal (BERBERINE COMPLEX PO), Glucosamine-Chondroit-Vit C-Mn (GLUCOSAMINE 1500 COMPLEX PO), Testosterone, levothyroxine, cilostazol, ALPRAZolam, Ozempic (1 MG/DOSE), propranolol, Vitamin D (Ergocalciferol),  Viibryd, clomiPHENE, and triamterene-hydrochlorothiazide.  Meds ordered this encounter  Medications  . sulfamethoxazole-trimethoprim (BACTRIM DS) 800-160 MG tablet    Sig: Take 1 tablet by mouth in the morning, at noon, and at bedtime for 10 days.    Dispense:  30 tablet    Refill:  0  . triamterene-hydrochlorothiazide (MAXZIDE-25) 37.5-25 MG tablet    Sig: Take 1 tablet by mouth daily.    Dispense:  90 tablet    Refill:  0     Follow-up: Return in about 3 weeks (around 10/25/2020).  Scarlette Calico, MD

## 2020-10-04 NOTE — Patient Instructions (Signed)

## 2020-10-05 ENCOUNTER — Other Ambulatory Visit: Payer: Self-pay | Admitting: Internal Medicine

## 2020-10-05 DIAGNOSIS — I10 Essential (primary) hypertension: Secondary | ICD-10-CM

## 2020-10-05 MED ORDER — TRIAMTERENE-HCTZ 37.5-25 MG PO TABS: 1.0000 | ORAL_TABLET | Freq: Every day | ORAL | 0 refills | Status: DC

## 2020-10-20 ENCOUNTER — Other Ambulatory Visit: Payer: Self-pay | Admitting: Internal Medicine

## 2020-10-20 ENCOUNTER — Encounter: Payer: Self-pay | Admitting: Internal Medicine

## 2020-10-20 DIAGNOSIS — H00013 Hordeolum externum right eye, unspecified eyelid: Secondary | ICD-10-CM

## 2020-10-27 ENCOUNTER — Other Ambulatory Visit: Payer: Self-pay

## 2020-10-27 ENCOUNTER — Other Ambulatory Visit: Payer: Self-pay | Admitting: Internal Medicine

## 2020-10-27 ENCOUNTER — Other Ambulatory Visit (INDEPENDENT_AMBULATORY_CARE_PROVIDER_SITE_OTHER): Payer: BC Managed Care – PPO

## 2020-10-27 DIAGNOSIS — E669 Obesity, unspecified: Secondary | ICD-10-CM | POA: Diagnosis not present

## 2020-10-27 DIAGNOSIS — L810 Postinflammatory hyperpigmentation: Secondary | ICD-10-CM | POA: Diagnosis not present

## 2020-10-27 DIAGNOSIS — E230 Hypopituitarism: Secondary | ICD-10-CM

## 2020-10-27 DIAGNOSIS — E1169 Type 2 diabetes mellitus with other specified complication: Secondary | ICD-10-CM | POA: Diagnosis not present

## 2020-10-27 DIAGNOSIS — E039 Hypothyroidism, unspecified: Secondary | ICD-10-CM

## 2020-10-27 DIAGNOSIS — D225 Melanocytic nevi of trunk: Secondary | ICD-10-CM | POA: Diagnosis not present

## 2020-10-27 DIAGNOSIS — L821 Other seborrheic keratosis: Secondary | ICD-10-CM | POA: Diagnosis not present

## 2020-10-27 DIAGNOSIS — L814 Other melanin hyperpigmentation: Secondary | ICD-10-CM | POA: Diagnosis not present

## 2020-10-27 LAB — COMPREHENSIVE METABOLIC PANEL
ALT: 21 U/L (ref 0–53)
AST: 22 U/L (ref 0–37)
Albumin: 4.3 g/dL (ref 3.5–5.2)
Alkaline Phosphatase: 63 U/L (ref 39–117)
BUN: 16 mg/dL (ref 6–23)
CO2: 31 mEq/L (ref 19–32)
Calcium: 9.6 mg/dL (ref 8.4–10.5)
Chloride: 103 mEq/L (ref 96–112)
Creatinine, Ser: 1.14 mg/dL (ref 0.40–1.50)
GFR: 71.69 mL/min (ref >60.00)
Glucose, Bld: 127 mg/dL — ABNORMAL HIGH (ref 70–99)
Potassium: 4.2 mEq/L (ref 3.5–5.1)
Sodium: 141 mEq/L (ref 135–145)
Total Bilirubin: 0.8 mg/dL (ref 0.2–1.2)
Total Protein: 7.5 g/dL (ref 6.0–8.3)

## 2020-10-27 LAB — CBC
HCT: 47.9 % (ref 39.0–52.0)
Hemoglobin: 15.9 g/dL (ref 13.0–17.0)
MCHC: 33.1 g/dL (ref 30.0–36.0)
MCV: 88.2 fl (ref 78.0–100.0)
Platelets: 197 10*3/uL (ref 150.0–400.0)
RBC: 5.43 Mil/uL (ref 4.22–5.81)
RDW: 14 % (ref 11.5–15.5)
WBC: 7 10*3/uL (ref 4.0–10.5)

## 2020-10-27 LAB — HEMOGLOBIN A1C: Hgb A1c MFr Bld: 5.9 % (ref 4.6–6.5)

## 2020-10-27 LAB — TESTOSTERONE: Testosterone: 544.48 ng/dL (ref 300.00–890.00)

## 2020-10-31 DIAGNOSIS — H00021 Hordeolum internum right upper eyelid: Secondary | ICD-10-CM | POA: Diagnosis not present

## 2020-10-31 NOTE — Progress Notes (Signed)
Patient ID: Zachary Murray, male   DOB: August 23, 1966, 54 y.o.   MRN: [id number]              Chief complaint: Endocrinology follow-up  History of Present Illness  PROBLEM 1  Hypogonadismwas first diagnosed in 11/18  Since about 2016 or so he has had complaints offatigue He also feels malaise and listless, mostly in the afternoons and did not feel like doing anything at that time Also has some decreased libido  The diagnosis was confirmed by the low free T level of 6.6 done in 5/19 [facility] was inappropriately low at 6.3  He was on a trial of CLOMIPHENE 25 mg 3 times a week With this his energy level and libido were improving but only partially This is despite his testosterone level being as high as 661, his LH had gone up to 20  Because of the lack of adequate symptomatic improvement he is taking AndroGel since 9/19 Appears to be requiring relatively large doses of AndroGel up to 6 pumps a day  With testosterone gel he was feeling significantly better with his energy level overall His dose has been adjusted periodic However because of his hemoglobin going up to 18.3 in 03/2019 he was told to reduce the dose to 4 pumps daily He was taking 5 pumps a day on the AndroGel in November when he was seen but because of fatigue and testosterone level of only 212 he was tried on [place] but he could not continue this because of cost  RECENT HISTORY: After his stopping Berdine Addison his testosterone level was still  at 297  In 2021 he had requested trying clomiphene because of fatigue and he felt it had helped his fatigue and libido Since his LH level had gone up to 19 he was started back on AndroGel He is taking  2 pumps daily as prescribed since his last visit a couple of months ago He has been regular with this  Clomiphene dose is now 25 mg twice a week  Previously his hemoglobin has been as high as 18 but is now consistently normal He does try to donate blood every 2 months  Testosterone is  545, previously 405  Lab Results  Component Value Date   TESTOSTERONE 544.48 10/27/2020   TESTOSTERONE 405.32 08/29/2020   TESTOSTERONE 509.06 06/07/2020   TESTOSTERONE 434.60 03/14/2020    Prolactin level: 5.8  Lab Results  Component Value Date   LH 19.08 (H) 06/07/2020   Lab Results  Component Value Date   HGB 15.9 10/27/2020    PROBLEM 2: DIABETES:  Diagnosis date: ?  2016  Previous history:  Although he was diagnosed to have diabetes in 2016 he was not able to tolerate metformin that was previously prescribed Also was tried on [place] in 2017 and 2018 with some improved control He was tried on Bydureon in 06/2017 and continued until mil 3/19 d He was started on [place] insulin in 01/2018 by his PCP when his A1c was 7.8 and fasting glucose 165 Insulin was stopped in 5/19 since his blood sugar was averaging only 92 on his freestyle sensor at home  Recent history:     His most recent A1c is 5.9, also on 6.6  Current management: Ozempic 1 mg weekly   Recently has started back on Ozempic as recommended in October  No nausea with this  However he is not exercising much because of cold weather, has not done much walking  Blood sugars are excellent  usually although relatively higher in the morning, 127 in the lab  Most of fingerstick sugars in the evenings are related night       Monitors blood glucose:   One Touch Verio meter         Blood Glucose readings from download   PRE-MEAL Fasting Lunch Dinner  overnight Overall  Glucose range:  123  105  106  66, 81, 90   Mean/median:      101   POST-MEAL PC Breakfast PC Lunch  bedtime  Glucose range:    83-156  Mean/median:    97     Side effects from medications:  Diarrhea from metformin         Meals:  Usually low-fat                    Dietician visit:   Never  Weight control:  Wt Readings from Last 3 Encounters:  11/01/20 (!) 351 lb (159.2 kg)  10/04/20 (!) 350 lb (158.8 kg)   08/31/20 (!) 353 lb 9.6 oz (160.4 kg)   Lab Results  Component Value Date   HGBA1C 5.9 10/27/2020   HGBA1C 6.6 (H) 06/07/2020   HGBA1C 6.3 03/14/2020   Lab Results  Component Value Date   MICROALBUR 4.1 (H) 08/29/2020   LDLCALC 63 12/27/2019   CREATININE 1.14 10/27/2020    HYPOTHYROIDISM:  He was previously given levothyroxine for high TSH in 12/16 but later stopped because of improved TSH  Because of his fatigue he had been started on levothyroxine in 3/19 with only slight improvement in his fatigue  He has been taking 75 mcg of levothyroxine and has been on the same dose since July 2019 Had not had any improvement in his fatigue with increasing the dose  TSH and free T4 consistently normal as below     Lab Results  Component Value Date   TSH 2.10 06/07/2020   TSH 2.43 12/27/2019   TSH 1.99 09/21/2019   FREET4 1.16 06/07/2020   FREET4 1.41 09/21/2019   FREET4 1.07 03/29/2019        Allergies as of 11/01/2020      Reactions   Metformin And Related Diarrhea   Wellbutrin [bupropion] Other (See Comments)   tremors   Lisinopril Cough      Medication List       Accurate as of November 01, 2020  8:17 AM. If you have any questions, ask your nurse or doctor.        ALPRAZolam 0.5 MG tablet Commonly known as: XANAX Take 1 tablet (0.5 mg total) by mouth 3 (three) times daily as needed for anxiety.   BERBERINE COMPLEX PO Take 1 capsule by mouth daily.   cilostazol 100 MG tablet Commonly known as: PLETAL TAKE 1 TABLET(100 MG) BY MOUTH TWICE DAILY   clomiPHENE 50 MG tablet Commonly known as: CLOMID TAKE 1/2 TABLET BY MOUTH 3 TIMES A WEEK   GLUCOSAMINE 1500 COMPLEX PO Take 1 capsule by mouth daily.   glucose blood test strip Use onetouch verio flex test strip as instructed to check blood sugar once daily.   Insulin Pen Needle 31G X 6 MM Misc Commonly known as: ReliOn Pen Needles Use as needed for insulin injection.   levothyroxine 75 MCG  tablet Commonly known as: SYNTHROID TAKE 1 TABLET(75 MCG) BY MOUTH DAILY   onetouch ultrasoft lancets Use onetouch delica lancet to check blood sugar once daily.   OneTouch Verio Flex System w/Device Kit Use as  instructed to check blood sugar once daily.   Ozempic (1 MG/DOSE) 4 MG/3ML Sopn Generic drug: Semaglutide (1 MG/DOSE) Inject 1 mg into the skin once a week.   primidone 50 MG tablet Commonly known as: MYSOLINE Take 1 tablet (50 mg total) by mouth at bedtime.   propranolol 40 MG tablet Commonly known as: INDERAL TAKE 1 TABLET(40 MG) BY MOUTH THREE TIMES DAILY   Testosterone 20.25 MG/ACT (1.62%) Gel Commonly known as: AndroGel Pump Apply 1 pump on each upper arm daily   triamterene-hydrochlorothiazide 37.5-25 MG tablet Commonly known as: MAXZIDE-25 Take 1 tablet by mouth daily.   Viibryd 20 MG Tabs Generic drug: Vilazodone HCl Take 1 tablet (20 mg total) by mouth daily with breakfast.   Vitamin D (Ergocalciferol) 1.25 MG (50000 UNIT) Caps capsule Commonly known as: DRISDOL TAKE 1 CAPSULE BY MOUTH 1 TIME A WEEK       Allergies:  Allergies  Allergen Reactions   Metformin And Related Diarrhea   Wellbutrin [Bupropion] Other (See Comments)    tremors   Lisinopril Cough    Past Medical History:  Diagnosis Date   Hypertension    Obesity    OSA on CPAP    Type II diabetes mellitus with manifestations [facility])     Past Surgical History:  Procedure Laterality Date   BIOPSY  08/31/2018   Procedure: BIOPSY;  Surgeon: Napoleon Form, MD;  Location: [facility];  Service: Endoscopy;;   COLONOSCOPY WITH PROPOFOL N/A 11/12/2016   Procedure: COLONOSCOPY WITH PROPOFOL;  Surgeon: Napoleon Form, MD;  Location: [facility];  Service: Endoscopy;  Laterality: N/A;   COLONOSCOPY WITH PROPOFOL N/A 08/31/2018   Procedure: COLONOSCOPY WITH PROPOFOL;  Surgeon: Napoleon Form, MD;  Location: [facility];  Service: Endoscopy;  Laterality: N/A;    POLYPECTOMY  08/31/2018   Procedure: POLYPECTOMY;  Surgeon: Napoleon Form, MD;  Location: [facility];  Service: Endoscopy;;   WISDOM TOOTH EXTRACTION      Family History  Problem Relation Age of Onset   Colon cancer Father    Hypertension Father    Thyroid cancer Mother    Colon polyps Mother    Thyroid disease Mother    Thyroid disease Sister    Thyroid disease Maternal Grandmother    Alcohol abuse Neg Hx    COPD Neg Hx    Diabetes Neg Hx    Early death Neg Hx    Hearing loss Neg Hx    Heart disease Neg Hx    Hyperlipidemia Neg Hx    Kidney disease Neg Hx    Stroke Neg Hx    Esophageal cancer Neg Hx     Social History:  reports that he has never smoked. He has quit using smokeless tobacco.  His smokeless tobacco use included chew. He reports that he does not drink alcohol and does not use drugs.  Review of Systems    LIPIDS: LDL below 100, not on a statin drug Followed by PCP  Lab Results  Component Value Date   CHOL 121 12/27/2019   HDL 48.70 12/27/2019   LDLCALC 63 12/27/2019   TRIG 48.0 12/27/2019   CHOLHDL 2 12/27/2019     General Examination:   BP 126/84    Pulse 93    Ht 6' (1.829 m)    Wt (!) 351 lb (159.2 kg)    SpO2 96%    BMI 47.60 kg/m     Assessment/ Plan:   HYPOGONADOTROPIC HYPOGONADISM with symptoms of fatigue and  low free testosterone at baseline  He has been symptomatic with fatigue and decreased libido  Currently on clomiphene 2 days a week with AndroGel 1 pump on each arm daily He still has  nonspecific fatigue and decreased libido This is despite excellent testosterone level of 544 He thinks his libido is better with clomiphene but with higher doses he had a high LH of around 20 Unlikely that libido is directly related to clomiphene use For now we will continue the same regimen  Currently hemoglobin is maintained at a good level although trending higher, since he is usually fairly regular with blood  donation every couple of months he can continue this  DIABETES with morbid obesity  Excellent control with A1c 5.9 He will continue to take Ozempic consistently, 1 mg weekly Encouraged him to start walking or doing any other indoor exercises such as YouTube Also consider nutrition consultation  FATIGUE and decreased libido: He will follow up with his PCP Also being followed by PCP for hypothyroidism   There are no Patient Instructions on file for this visit.  Elayne Snare 11/01/2020, 8:17 AM      Note: This office note was prepared with Dragon voice recognition system technology. Any transcriptional errors that result from this process are unintentional.     Elayne Snare 11/01/2020, 8:17 AM

## 2020-11-01 ENCOUNTER — Encounter: Payer: Self-pay | Admitting: Endocrinology

## 2020-11-01 ENCOUNTER — Ambulatory Visit: Payer: BC Managed Care – PPO | Admitting: Endocrinology

## 2020-11-01 ENCOUNTER — Other Ambulatory Visit: Payer: Self-pay

## 2020-11-01 VITALS — BP 126/84 | HR 93 | Ht 72.0 in | Wt 351.0 lb

## 2020-11-01 DIAGNOSIS — E230 Hypopituitarism: Secondary | ICD-10-CM

## 2020-11-01 DIAGNOSIS — E039 Hypothyroidism, unspecified: Secondary | ICD-10-CM

## 2020-11-01 DIAGNOSIS — E1169 Type 2 diabetes mellitus with other specified complication: Secondary | ICD-10-CM | POA: Diagnosis not present

## 2020-11-01 DIAGNOSIS — E669 Obesity, unspecified: Secondary | ICD-10-CM | POA: Diagnosis not present

## 2020-11-01 NOTE — Patient Instructions (Signed)
More exercise   

## 2020-11-16 ENCOUNTER — Other Ambulatory Visit: Payer: Self-pay | Admitting: Internal Medicine

## 2020-11-16 DIAGNOSIS — I739 Peripheral vascular disease, unspecified: Secondary | ICD-10-CM

## 2020-11-27 ENCOUNTER — Encounter: Payer: Self-pay | Admitting: Internal Medicine

## 2020-12-01 ENCOUNTER — Other Ambulatory Visit: Payer: Self-pay | Admitting: Endocrinology

## 2020-12-26 ENCOUNTER — Other Ambulatory Visit: Payer: Self-pay | Admitting: Internal Medicine

## 2020-12-26 DIAGNOSIS — F332 Major depressive disorder, recurrent severe without psychotic features: Secondary | ICD-10-CM

## 2020-12-26 DIAGNOSIS — F411 Generalized anxiety disorder: Secondary | ICD-10-CM

## 2020-12-26 MED ORDER — DULOXETINE HCL 30 MG PO CPEP: 30.0000 mg | ORAL_CAPSULE | Freq: Every day | ORAL | 0 refills | Status: DC

## 2021-01-14 ENCOUNTER — Other Ambulatory Visit: Payer: Self-pay | Admitting: Internal Medicine

## 2021-01-14 DIAGNOSIS — E559 Vitamin D deficiency, unspecified: Secondary | ICD-10-CM

## 2021-01-14 DIAGNOSIS — F332 Major depressive disorder, recurrent severe without psychotic features: Secondary | ICD-10-CM

## 2021-01-14 DIAGNOSIS — E039 Hypothyroidism, unspecified: Secondary | ICD-10-CM

## 2021-01-14 DIAGNOSIS — F411 Generalized anxiety disorder: Secondary | ICD-10-CM

## 2021-01-14 MED ORDER — DULOXETINE HCL 60 MG PO CPEP: 60.0000 mg | ORAL_CAPSULE | Freq: Every day | ORAL | 0 refills | Status: DC

## 2021-02-16 ENCOUNTER — Other Ambulatory Visit: Payer: Self-pay | Admitting: Internal Medicine

## 2021-02-16 DIAGNOSIS — I739 Peripheral vascular disease, unspecified: Secondary | ICD-10-CM

## 2021-02-24 ENCOUNTER — Other Ambulatory Visit: Payer: Self-pay

## 2021-02-24 ENCOUNTER — Ambulatory Visit
Admission: EM | Admit: 2021-02-24 | Discharge: 2021-02-24 | Disposition: A | Discharge status: Home / Self Care | Payer: BC Managed Care – PPO | Attending: Family Medicine | Admitting: Family Medicine

## 2021-02-24 DIAGNOSIS — S80811A Abrasion, right lower leg, initial encounter: Secondary | ICD-10-CM

## 2021-02-24 NOTE — ED Triage Notes (Signed)
Pt presents with right lower leg bleeding , possibly form ruptured varicose vein , dressing applied , bleeding controlled

## 2021-02-26 NOTE — ED Provider Notes (Signed)
[place]   [id number] 02/24/21 Arrival Time: 1509  ASSESSMENT & PLAN:  1. Abrasion of skin of right lower leg    Small abrasion on RLE cauterized with silver nitrate. Non-stick bandage applied.    Follow-up Information    [name], MD.   Specialty: Internal Medicine Why: As needed. Contact information: [place] [address] [facility] .   Specialty: Urgent Care Why: If worsening or failing to improve as anticipated. Contact information: [id number] [place] [address] [phone]              Reviewed expectations re: course of current medical issues. Questions answered. Outlined signs and symptoms indicating need for more acute intervention. Understanding verbalized. After Visit Summary given.   SUBJECTIVE: History from: patient. Zachary Murray is a 55 y.o. male who reports RLE bleeding; abrupt onset 'after scratching my leg'. Difficult to control. Wrapped at home tightly. No pain.    OBJECTIVE:  Vitals:   02/24/21 1533  BP: 110/73  Pulse: 89  Resp: 20  Temp: 98.2 F (36.8 C)  TempSrc: Oral  SpO2: 93%    General appearance: alert; no distress Eyes: PERRLA; EOMI; conjunctiva normal Extremities: approx 0.5 cm abrasion over R lower inner leg; slight oozing Skin: warm and dry Neurologic: normal gait Psychological: alert and cooperative; normal mood and affect   Allergies  Allergen Reactions  . Metformin And Related Diarrhea  . Wellbutrin [Bupropion] Other (See Comments)    tremors  . Lisinopril Cough    Past Medical History:  Diagnosis Date  . Hypertension   . Obesity   . OSA on CPAP   . Type II diabetes mellitus with manifestations [facility])    Social History   Socioeconomic History  . Marital status: Single    Spouse name: Not on file  . Number of children: 0  . Years of education: Not on file  .  Highest education level: Some college, no degree  Occupational History  . Occupation: unemployed  Tobacco Use  . Smoking status: Never Smoker  . Smokeless tobacco: Former Systems developer    Types: Secondary school teacher  . Vaping Use: Never used  Substance and Sexual Activity  . Alcohol use: No    Alcohol/week: 0.0 standard drinks    Comment: hx of alcohol abuse - quit 2017  . Drug use: No  . Sexual activity: Not Currently  Other Topics Concern  . Not on file  Social History Narrative  . Not on file   Social Determinants of Health   Financial Resource Strain: Not on file  Food Insecurity: Not on file  Transportation Needs: Not on file  Physical Activity: Not on file  Stress: Not on file  Social Connections: Not on file  Intimate Partner Violence: Not on file   Family History  Problem Relation Age of Onset  . Colon cancer Father   . Hypertension Father   . Thyroid cancer Mother   . Colon polyps Mother   . Thyroid disease Mother   . Thyroid disease Sister   . Thyroid disease Maternal Grandmother   . Alcohol abuse Neg Hx   . COPD Neg Hx   . Diabetes Neg Hx   . Early death Neg Hx   . Hearing loss Neg Hx   . Heart disease Neg Hx   . Hyperlipidemia Neg Hx   .  Kidney disease Neg Hx   . Stroke Neg Hx   . Esophageal cancer Neg Hx    Past Surgical History:  Procedure Laterality Date  . BIOPSY  08/31/2018   Procedure: BIOPSY;  Surgeon: Mauri Pole, MD;  [facility]: [facility];  Service: Endoscopy;;  . COLONOSCOPY WITH PROPOFOL N/A 11/12/2016   Procedure: COLONOSCOPY WITH PROPOFOL;  Surgeon: Mauri Pole, MD;  Location: [facility];  Service: Endoscopy;  Laterality: N/A;  . COLONOSCOPY WITH PROPOFOL N/A 08/31/2018   Procedure: COLONOSCOPY WITH PROPOFOL;  Surgeon: Mauri Pole, MD;  [facility];  Service: Endoscopy;  Laterality: N/A;  . POLYPECTOMY  08/31/2018   Procedure: POLYPECTOMY;  Surgeon: Mauri Pole, MD;  Location: [facility];  Service:  Endoscopy;;  . WISDOM TOOTH EXTRACTION       Vanessa Kick, MD 02/26/21 [phone]

## 2021-02-27 ENCOUNTER — Other Ambulatory Visit: Payer: Self-pay

## 2021-02-27 ENCOUNTER — Ambulatory Visit (HOSPITAL_COMMUNITY)
Admission: RE | Admit: 2021-02-27 | Discharge: 2021-02-27 | Disposition: A | Discharge status: Home / Self Care | Payer: BC Managed Care – PPO | Source: Ambulatory Visit | Attending: Cardiovascular Disease | Admitting: Cardiovascular Disease

## 2021-02-27 ENCOUNTER — Other Ambulatory Visit: Payer: Self-pay | Admitting: Family

## 2021-02-27 ENCOUNTER — Ambulatory Visit (INDEPENDENT_AMBULATORY_CARE_PROVIDER_SITE_OTHER): Payer: BC Managed Care – PPO | Admitting: Family

## 2021-02-27 ENCOUNTER — Encounter: Payer: Self-pay | Admitting: Family

## 2021-02-27 VITALS — BP 110/60 | HR 77 | Temp 97.7°F | Ht 72.0 in | Wt 351.2 lb

## 2021-02-27 DIAGNOSIS — R600 Localized edema: Secondary | ICD-10-CM | POA: Diagnosis not present

## 2021-02-27 LAB — COMPREHENSIVE METABOLIC PANEL
ALT: 23 U/L (ref 0–53)
AST: 23 U/L (ref 0–37)
Albumin: 4 g/dL (ref 3.5–5.2)
Alkaline Phosphatase: 53 U/L (ref 39–117)
BUN: 15 mg/dL (ref 6–23)
CO2: 30 mEq/L (ref 19–32)
Calcium: 9.3 mg/dL (ref 8.4–10.5)
Chloride: 103 mEq/L (ref 96–112)
Creatinine, Ser: 1.04 mg/dL (ref 0.40–1.50)
GFR: 79.85 mL/min (ref >60.00)
Glucose, Bld: 111 mg/dL — ABNORMAL HIGH (ref 70–99)
Potassium: 4.1 mEq/L (ref 3.5–5.1)
Sodium: 141 mEq/L (ref 135–145)
Total Bilirubin: 0.8 mg/dL (ref 0.2–1.2)
Total Protein: 7.1 g/dL (ref 6.0–8.3)

## 2021-02-27 LAB — CBC WITH DIFFERENTIAL/PLATELET
Basophils Absolute: 0 10*3/uL (ref 0.0–0.1)
Basophils Relative: 0.9 % (ref 0.0–3.0)
Eosinophils Absolute: 0.2 10*3/uL (ref 0.0–0.7)
Eosinophils Relative: 4.4 % (ref 0.0–5.0)
HCT: 43.8 % (ref 39.0–52.0)
Hemoglobin: 14.9 g/dL (ref 13.0–17.0)
Lymphocytes Relative: 23 % (ref 12.0–46.0)
Lymphs Abs: 1.2 10*3/uL (ref 0.7–4.0)
MCHC: 34 g/dL (ref 30.0–36.0)
MCV: 87.3 fl (ref 78.0–100.0)
Monocytes Absolute: 0.7 10*3/uL (ref 0.1–1.0)
Monocytes Relative: 13 % — ABNORMAL HIGH (ref 3.0–12.0)
Neutro Abs: 3.1 10*3/uL (ref 1.4–7.7)
Neutrophils Relative %: 58.7 % (ref 43.0–77.0)
Platelets: 201 10*3/uL (ref 150.0–400.0)
RBC: 5.01 Mil/uL (ref 4.22–5.81)
RDW: 14.4 % (ref 11.5–15.5)
WBC: 5.2 10*3/uL (ref 4.0–10.5)

## 2021-02-27 LAB — BRAIN NATRIURETIC PEPTIDE: Pro B Natriuretic peptide (BNP): 16 pg/mL (ref 0.0–100.0)

## 2021-02-27 MED ORDER — FUROSEMIDE 20 MG PO TABS: 20.0000 mg | ORAL_TABLET | Freq: Two times a day (BID) | ORAL | 0 refills | Status: DC | PRN

## 2021-02-27 MED ORDER — POTASSIUM CHLORIDE CRYS ER 20 MEQ PO TBCR: 20.0000 meq | EXTENDED_RELEASE_TABLET | Freq: Every day | ORAL | 0 refills | Status: DC

## 2021-02-27 NOTE — Progress Notes (Signed)
Zachary Murray is a 55 y.o. male with the following history as recorded in EpicCare:  Patient Active Problem List   Diagnosis Date Noted  . Hordeolum externum of right eye 10/04/2020  . Peripheral vascular disease (HCC) 05/06/2019  . Polyp of descending colon   . Polyp of transverse colon   . Tremor, essential 07/27/2018  . Vitamin D deficiency 02/11/2018  . Hypogonadism male 02/11/2018  . GAD (generalized anxiety disorder) 10/22/2017  . Meralgia paresthetica, left 10/02/2017  . Routine general medical examination at a health care facility 03/26/2017  . Severe episode of recurrent major depressive disorder, without psychotic features (HCC) 03/26/2017  . Lumbar radiculopathy 07/02/2016  . Type II diabetes mellitus with manifestations (HCC) 10/26/2015  . Hypothyroidism 12/08/2014  . Hyperlipidemia with target LDL less than 130 11/19/2013  . OSA (obstructive sleep apnea) 11/19/2013  . Severe obesity (BMI >= 40) (HCC) 09/15/2012  . Essential hypertension, benign 09/15/2012    Current Outpatient Medications  Medication Sig Dispense Refill  . furosemide (LASIX) 20 MG tablet Take 1 tablet (20 mg total) by mouth 2 (two) times daily as needed for fluid or edema. 30 tablet 0  . potassium chloride SA (KLOR-CON) 20 MEQ tablet Take 1 tablet (20 mEq total) by mouth daily. Use as directed with Furosemide 30 tablet 0  . Testosterone 1.62 % GEL APPLY 1 PUMP ON EACH UPPER ARM EVERY DAY 75 g 2  . ALPRAZolam (XANAX) 0.5 MG tablet TAKE 1 TABLET(0.5 MG) BY MOUTH THREE TIMES DAILY AS NEEDED FOR ANXIETY 270 tablet 0  . Blood Glucose Monitoring Suppl (ONETOUCH VERIO FLEX SYSTEM) w/Device KIT Use as instructed to check blood sugar once daily. 1 kit 0  . cilostazol (PLETAL) 100 MG tablet TAKE 1 TABLET(100 MG) BY MOUTH TWICE DAILY 180 tablet 0  . clomiPHENE (CLOMID) 50 MG tablet TAKE 1/2 TABLET BY MOUTH 3 TIMES A WEEK 20 tablet 1  . Glucosamine-Chondroit-Vit C-Mn (GLUCOSAMINE 1500 COMPLEX PO) Take 1 capsule by  mouth daily.     Marland Kitchen glucose blood test strip Use onetouch verio flex test strip as instructed to check blood sugar once daily. 100 each 3  . Insulin Pen Needle (RELION PEN NEEDLES) 31G X 6 MM MISC Use as needed for insulin injection. 100 each 3  . Lancets (ONETOUCH ULTRASOFT) lancets Use onetouch delica lancet to check blood sugar once daily. 100 each 3  . levothyroxine (SYNTHROID) 75 MCG tablet TAKE 1 TABLET(75 MCG) BY MOUTH DAILY 90 tablet 0  . primidone (MYSOLINE) 50 MG tablet Take 1 tablet (50 mg total) by mouth at bedtime. 90 tablet 1  . propranolol (INDERAL) 40 MG tablet TAKE 1 TABLET(40 MG) BY MOUTH THREE TIMES DAILY 270 tablet 1  . Semaglutide, 1 MG/DOSE, (OZEMPIC, 1 MG/DOSE,) 4 MG/3ML SOPN Inject 1 mg into the skin once a week. 3 mL 2  . triamterene-hydrochlorothiazide (MAXZIDE-25) 37.5-25 MG tablet Take 1 tablet by mouth daily. 90 tablet 0  . Vitamin D, Ergocalciferol, (DRISDOL) 1.25 MG (50000 UNIT) CAPS capsule TAKE 1 CAPSULE BY MOUTH 1 TIME A WEEK 12 capsule 0   No current facility-administered medications for this visit.    Allergies: Metformin and related, Wellbutrin [bupropion], and Lisinopril  Past Medical History:  Diagnosis Date  . Hypertension   . Obesity   . OSA on CPAP   . Type II diabetes mellitus with manifestations [facility])     Past Surgical History:  Procedure Laterality Date  . BIOPSY  08/31/2018   Procedure: BIOPSY;  Surgeon: Mauri Pole, MD;  Location: [facility];  Service: Endoscopy;;  . COLONOSCOPY WITH PROPOFOL N/A 11/12/2016   Procedure: COLONOSCOPY WITH PROPOFOL;  Surgeon: Mauri Pole, MD;  [facility];  Service: Endoscopy;  Laterality: N/A;  . COLONOSCOPY WITH PROPOFOL N/A 08/31/2018   Procedure: COLONOSCOPY WITH PROPOFOL;  Surgeon: Mauri Pole, MD;  Location: [facility];  Service: Endoscopy;  Laterality: N/A;  . POLYPECTOMY  08/31/2018   Procedure: POLYPECTOMY;  Surgeon: Mauri Pole, MD;  Location[facility];  Service: Endoscopy;;  . WISDOM TOOTH EXTRACTION      Family History  Problem Relation Age of Onset  . Colon cancer Father   . Hypertension Father   . Thyroid cancer Mother   . Colon polyps Mother   . Thyroid disease Mother   . Thyroid disease Sister   . Thyroid disease Maternal Grandmother   . Alcohol abuse Neg Hx   . COPD Neg Hx   . Diabetes Neg Hx   . Early death Neg Hx   . Hearing loss Neg Hx   . Heart disease Neg Hx   . Hyperlipidemia Neg Hx   . Kidney disease Neg Hx   . Stroke Neg Hx   . Esophageal cancer Neg Hx     Social History   Tobacco Use  . Smoking status: Never Smoker  . Smokeless tobacco: Former Systems developer    Types: Chew  Substance Use Topics  . Alcohol use: No    Alcohol/week: 0.0 standard drinks    Comment: hx of alcohol abuse - quit 2017    Subjective:  Swelling in both legs x 1 week; notes that in the past 4 days was on his feet more than usual; does take Triamterene/ HCTZ daily; has never experienced this type of swelling before; no recent travel; notes that he has tried a "cupping" system prior to onset of symptoms- did help with chronic shoulder issues/ wonders if this caused reaction; No chest pain or shortness of breath; no previous history of CHF;      Objective:  Vitals:   02/27/21 0934  BP: 110/60  Pulse: 77  Temp: 97.7 F (36.5 C)  TempSrc: Oral  SpO2: 97%  Weight: (!) 351 lb 3.2 oz (159.3 kg)  Height: 6' (1.829 m)    General: Well developed, well nourished, in no acute distress  Skin : Warm and dry.  Head: Normocephalic and atraumatic  Eyes: Sclera and conjunctiva clear; pupils round and reactive to light; extraocular movements intact  Ears: External normal; canals clear; tympanic membranes normal  Oropharynx: Pink, supple. No suspicious lesions  Neck: Supple without thyromegaly, adenopathy  Lungs: Respirations unlabored; clear to auscultation bilaterally without wheeze, rales, rhonchi  CVS exam: normal rate and regular  rhythm.  Extremities: bilateral pedal edema, cyanosis, clubbing; extensive varicosities noted on lower extremities  Vessels: Symmetric bilaterally  Neurologic: Alert and oriented; speech intact; face symmetrical; moves all extremities well; CNII-XII intact without focal deficit   Assessment:  1. Pedal edema     Plan:  ? Reaction to cupping- lymph/ toxins that body could not clear; will check bilateral dopplers and labs today; discussed CXR but patient defers today; Trial of Lasix 20 mg bid prn- do not use with Triam/ HCTZ;  Follow-up to be determined;  This visit occurred during the SARS-CoV-2 public health emergency.  Safety protocols were in place, including screening questions prior to the visit, additional usage of staff PPE, and extensive cleaning of exam room while  observing appropriate contact time as indicated for disinfecting solutions.     No follow-ups on file.  Orders Placed This Encounter  Procedures  . CBC with Differential/Platelet    Standing Status:   Future    Number of Occurrences:   1    Standing Expiration Date:   02/27/2022  . Comp Met (CMET)    Standing Status:   Future    Number of Occurrences:   1    Standing Expiration Date:   02/27/2022  . Brain natriuretic peptide    Standing Status:   Future    Number of Occurrences:   1    Standing Expiration Date:   02/27/2022    Requested Prescriptions   Signed Prescriptions Disp Refills  . furosemide (LASIX) 20 MG tablet 30 tablet 0    Sig: Take 1 tablet (20 mg total) by mouth 2 (two) times daily as needed for fluid or edema.  . potassium chloride SA (KLOR-CON) 20 MEQ tablet 30 tablet 0    Sig: Take 1 tablet (20 mEq total) by mouth daily. Use as directed with Furosemide

## 2021-02-27 NOTE — Patient Instructions (Signed)
Hold Triamterene/ HCTZ on the days you use the Furosemide; Take a potassium supplement daily on the days you use Furosemide; We will be in touch after we get results;  Please tell lab at Dr. Ronnie Derby office that you don't need a repeat CBC or CMP;

## 2021-02-28 ENCOUNTER — Telehealth: Payer: Self-pay | Admitting: Internal Medicine

## 2021-02-28 ENCOUNTER — Other Ambulatory Visit: Payer: Self-pay | Admitting: Family

## 2021-02-28 DIAGNOSIS — I83893 Varicose veins of bilateral lower extremities with other complications: Secondary | ICD-10-CM

## 2021-02-28 DIAGNOSIS — I82813 Embolism and thrombosis of superficial veins of lower extremities, bilateral: Secondary | ICD-10-CM

## 2021-02-28 NOTE — Telephone Encounter (Signed)
I have called and spoken to the pt. I have routed message to provider in results tab on pts concerns.

## 2021-02-28 NOTE — Telephone Encounter (Signed)
Please return call to patient for lab results

## 2021-03-01 ENCOUNTER — Ambulatory Visit
Admission: EM | Admit: 2021-03-01 | Discharge: 2021-03-01 | Disposition: A | Discharge status: Home / Self Care | Payer: BC Managed Care – PPO | Attending: Family Medicine | Admitting: Family Medicine

## 2021-03-01 ENCOUNTER — Telehealth: Payer: Self-pay | Admitting: Internal Medicine

## 2021-03-01 ENCOUNTER — Other Ambulatory Visit: Payer: Self-pay

## 2021-03-01 DIAGNOSIS — S81801D Unspecified open wound, right lower leg, subsequent encounter: Secondary | ICD-10-CM

## 2021-03-01 DIAGNOSIS — S81802A Unspecified open wound, left lower leg, initial encounter: Secondary | ICD-10-CM

## 2021-03-01 NOTE — ED Provider Notes (Signed)
[facility]    CSN: [id number] Arrival date & time: 03/01/21  1432      History   Chief Complaint Chief Complaint  Patient presents with  . Wound Check    [name] is a 55 y.o. male.   HPI Patient with type 2 diabetes and vascular insufficiency presents today with 2 small open vascular wounds which are persistently bleeding.  Patient is not currently on anticoagulant therapy.  Recently had a DVT study which indicated some vascular insufficiency and a chronic superficial thrombosis.  Patient was seen here in clinic in the earlier part of April and had a similar lesion cauterized which was bleeding profusely.  He reports that he was stepping out of the shower and drying his lower legs when this small wound opened and began to bleed profusely.  The the wound is nonpainful. Past Medical History:  Diagnosis Date  . Hypertension   . Obesity   . OSA on CPAP   . Type II diabetes mellitus with manifestations [facility])     Patient Active Problem List   Diagnosis Date Noted  . Hordeolum externum of right eye 10/04/2020  . Peripheral vascular disease ([place]) 05/06/2019  . Polyp of descending colon   . Polyp of transverse colon   . Tremor, essential 07/27/2018  . Vitamin D deficiency 02/11/2018  . Hypogonadism male 02/11/2018  . GAD (generalized anxiety disorder) 10/22/2017  . Meralgia paresthetica, left 10/02/2017  . Routine general medical examination at a health care facility 03/26/2017  . Severe episode of recurrent major depressive disorder, without psychotic features ([place]) 03/26/2017  . Lumbar radiculopathy 07/02/2016  . Type II diabetes mellitus with manifestations ([place]) 10/26/2015  . Hypothyroidism 12/08/2014  . Hyperlipidemia with target LDL less than 130 11/19/2013  . OSA (obstructive sleep apnea) 11/19/2013  . Severe obesity (BMI >= 40) ([place]) 09/15/2012  . Essential hypertension, benign 09/15/2012    Past Surgical History:  Procedure Laterality Date   . BIOPSY  08/31/2018   Procedure: BIOPSY;  Surgeon: Mauri Pole, MD;  Location: [facility];  Service: Endoscopy;;  . COLONOSCOPY WITH PROPOFOL N/A 11/12/2016   Procedure: COLONOSCOPY WITH PROPOFOL;  Surgeon: Mauri Pole, MD;  [facility];  Service: Endoscopy;  Laterality: N/A;  . COLONOSCOPY WITH PROPOFOL N/A 08/31/2018   Procedure: COLONOSCOPY WITH PROPOFOL;  Surgeon: Mauri Pole, MD;  [facility];  Service: Endoscopy;  Laterality: N/A;  . POLYPECTOMY  08/31/2018   Procedure: POLYPECTOMY;  Surgeon: Mauri Pole, MD;  [facility];  Service: Endoscopy;;  . WISDOM TOOTH EXTRACTION         Home Medications    Prior to Admission medications   Medication Sig Start Date End Date Taking? Authorizing Provider  Testosterone 1.62 % GEL APPLY 1 PUMP ON [facility] UPPER ARM EVERY DAY 12/01/20   Elayne Snare, MD  ALPRAZolam Duanne Moron) 0.5 MG tablet TAKE 1 TABLET(0.5 MG) BY MOUTH THREE TIMES DAILY AS NEEDED FOR ANXIETY 12/27/20   [name], MD  Blood Glucose Monitoring Suppl ([place]) w/Device KIT Use as instructed to check blood sugar once daily. 04/01/19   Elayne Snare, MD  cilostazol (PLETAL) 100 MG tablet TAKE 1 TABLET(100 MG) BY MOUTH TWICE DAILY 02/16/21   [name], MD  clomiPHENE (CLOMID) 50 MG tablet TAKE 1/2 TABLET BY MOUTH 3 TIMES A WEEK 10/03/20   Elayne Snare, MD  furosemide (LASIX) 20 MG tablet Take 1 tablet (20 mg total) by mouth  2 (two) times daily as needed for fluid or edema. 02/27/21   Marrian Salvage, FNP  Glucosamine-Chondroit-Vit C-Mn (GLUCOSAMINE 1500 COMPLEX PO) Take 1 capsule by mouth daily.  02/25/06   [provider]  glucose blood test strip Use onetouch verio flex test strip as instructed to check blood sugar once daily. 04/01/19   Elayne Snare, MD  Insulin Pen Needle (RELION PEN NEEDLES) 31G X 6 MM MISC Use as needed for insulin injection. 02/18/18   [name], MD  Lancets  [facility] ULTRASOFT) lancets Use onetouch delica lancet to check blood sugar once daily. 04/01/19   Elayne Snare, MD  levothyroxine (SYNTHROID) 75 MCG tablet TAKE 1 TABLET(75 MCG) BY MOUTH DAILY 01/14/21   [name], MD  potassium chloride SA (KLOR-CON) 20 MEQ tablet Take 1 tablet (20 mEq total) by mouth daily. Use as directed with Furosemide 02/27/21   Marrian Salvage, FNP  primidone (MYSOLINE) 50 MG tablet Take 1 tablet (50 mg total) by mouth at bedtime. 12/06/19   Tat, Eustace Quail, DO  propranolol (INDERAL) 40 MG tablet TAKE 1 TABLET(40 MG) BY MOUTH THREE TIMES DAILY 09/06/20   [name], MD  Semaglutide, 1 MG/DOSE, (OZEMPIC, 1 MG/DOSE,) 4 MG/3ML SOPN Inject 1 mg into the skin once a week. 08/31/20   Elayne Snare, MD  triamterene-hydrochlorothiazide (MAXZIDE-25) 37.5-25 MG tablet Take 1 tablet by mouth daily. 10/05/20   [name], MD  Vitamin D, Ergocalciferol, (DRISDOL) 1.25 MG (50000 UNIT) CAPS capsule TAKE 1 CAPSULE BY MOUTH 1 TIME A WEEK 01/14/21   [name], MD    Family History Family History  Problem Relation Age of Onset  . Colon cancer Father   . Hypertension Father   . Thyroid cancer Mother   . Colon polyps Mother   . Thyroid disease Mother   . Thyroid disease Sister   . Thyroid disease Maternal Grandmother   . Alcohol abuse Neg Hx   . COPD Neg Hx   . Diabetes Neg Hx   . Early death Neg Hx   . Hearing loss Neg Hx   . Heart disease Neg Hx   . Hyperlipidemia Neg Hx   . Kidney disease Neg Hx   . Stroke Neg Hx   . Esophageal cancer Neg Hx     Social History Social History   Tobacco Use  . Smoking status: Never Smoker  . Smokeless tobacco: Former Systems developer    Types: Secondary school teacher  . Vaping Use: Never used  Substance Use Topics  . Alcohol use: No    Alcohol/week: 0.0 standard drinks    Comment: hx of alcohol abuse - quit 2017  . Drug use: No     Allergies   Metformin and related, Wellbutrin [bupropion], and Lisinopril   Review of  Systems Review of Systems Pertinent negatives listed in HPI Physical Exam Triage Vital Signs ED Triage Vitals  Enc Vitals Group     BP 03/01/21 1642 114/81     Pulse Rate 03/01/21 1642 92     Resp 03/01/21 1642 18     Temp 03/01/21 1642 98.2 F (36.8 C)     Temp Source 03/01/21 1642 Oral     SpO2 03/01/21 1642 97 %     Weight --      Height --      Head Circumference --      Peak Flow --      Pain Score 03/01/21 1643 3  Pain Loc --      Pain Edu? --      Excl. in [place]? --    No data found.  Updated Vital Signs BP 114/81 (BP Location: Left Arm)   Pulse 92   Temp 98.2 F (36.8 C) (Oral)   Resp 18   SpO2 97%   Visual Acuity Right Eye Distance:   Left Eye Distance:   Bilateral Distance:    Right Eye Near:   Left Eye Near:    Bilateral Near:     Physical Exam General appearance: Alert, obese, cooperative, no distress,  Head: Normocephalic, without obvious abnormality, atraumatic Respiratory: Respirations even and unlabored, normal respiratory rate Heart: rate and rhythm normal. No gallop or murmurs noted on exam  Abdomen: BS +, no distention, no rebound tenderness, or no mass Extremities: Diffuse varicosities noted on bilateral lower extremities, pitting edema +3 bilateral lower extremities, right lower leg draining open vascular lesion less than 1 mm, less than 1 mm bleeding diffuse lesion left lower extremity Skin: Skin color, texture, turgor normal. No rashes seen  Psych: Appropriate mood and affect. Neurologic: Mental status: Alert, oriented to person, place, and time, thought content appropriate.  UC Treatments / Results  Labs (all labs ordered are listed, but only abnormal results are displayed) Labs Reviewed - No data to display  EKG   Radiology No results found.  Procedures Wound Care  Date/Time: 03/01/2021 5:20 PM Performed by: Scot Jun, FNP Authorized by: Scot Jun, FNP   Consent:    Consent obtained:  Verbal    Alternatives discussed:  No treatment Universal protocol:    Patient identity confirmed:  Verbally with patient Procedure details:    Indications: open wounds     Wound location:  Leg   Leg location:  L lower leg Skin layer closed with:    Wound care performed: Cauterized left leg wound with silver nitrate. Dressing:    Dressing applied:  Kerlix   Wrapped with:  Conform bandage 2 inch Post-procedure details:    Procedure completion:  Tolerated Comments:     Nonadherent wound applied to left extremity  Wound Care  Date/Time: 03/01/2021 6:29 PM Performed by: Scot Jun, FNP Authorized by: Scot Jun, FNP   Consent:    Consent obtained:  Verbal   Consent given by:  Patient   Risks discussed:  Bleeding Procedure details:    Indications: open wounds     Wound location:  Leg   Leg location:  R lower leg Skin layer closed with:    Suture technique: Silver nitrate applied to wound closed. Dressing:    Dressing applied:  Kerlix   Wrapped with:  Coban 4 inch Post-procedure details:    Procedure completion:  Tolerated Comments:     90 here and bandage applied wrapped with Kerlix and secured with Coban   (including critical care time)  Medications Ordered in UC Medications - No data to display  Initial Impression / Assessment and Plan / UC Course  I have reviewed the triage vital signs and the nursing notes.  Pertinent labs & imaging results that were available during my care of the patient were reviewed by me and considered in my medical decision making (see chart for details).      Left lower leg and right lower leg open bleeding wound closed with silver nitrate.  Patient has a history of severe vascular insufficiency and peripheral vascular disease.  Patient advised to follow-up with primary care provider and  vascular specialist for further work-up and evaluation of symptoms.  Continue to apply dressings daily to prevent reinjury of repaired sites.  No  evidence of infection.  Return precautions given.   Final Clinical Impressions(s) / UC Diagnoses   Final diagnoses:  Open leg wound, right, subsequent encounter  Open leg wound, left, initial encounter   Discharge Instructions   None    ED Prescriptions    None     PDMP not reviewed this encounter.   Scot Jun, FNP 03/01/21 [phone]

## 2021-03-01 NOTE — ED Triage Notes (Signed)
Pt c/o bleeding to LLE from a ruptured varicose vein today lasting 6mins. Bandage applied and no active bleeding at this time.. States this is the second time this has happen. States here on 4/9 for RLL varicose vein rupture and they used silver nitrate and hasn't had other problems to that leg.

## 2021-03-01 NOTE — Telephone Encounter (Signed)
Patient called and said that he has a pin hole bleeder on his left leg. Spoke w/ Dr. Ronnald Ramp and he advise that the patient go to the ED. Patient verbalized understanding.

## 2021-03-02 ENCOUNTER — Encounter: Payer: Self-pay | Admitting: Family

## 2021-03-05 ENCOUNTER — Other Ambulatory Visit: Payer: Self-pay | Admitting: *Deleted

## 2021-03-05 ENCOUNTER — Other Ambulatory Visit: Payer: Self-pay | Admitting: Internal Medicine

## 2021-03-05 DIAGNOSIS — G250 Essential tremor: Secondary | ICD-10-CM

## 2021-03-05 MED ORDER — ONETOUCH DELICA PLUS LANCET33G MISC: 1 refills | Status: DC

## 2021-03-05 MED ORDER — ONETOUCH VERIO VI STRP: 1.0000 | ORAL_STRIP | 1 refills | Status: DC | PRN

## 2021-03-06 ENCOUNTER — Other Ambulatory Visit: Payer: Self-pay

## 2021-03-06 ENCOUNTER — Other Ambulatory Visit: Payer: BC Managed Care – PPO

## 2021-03-06 ENCOUNTER — Other Ambulatory Visit (INDEPENDENT_AMBULATORY_CARE_PROVIDER_SITE_OTHER): Payer: BC Managed Care – PPO

## 2021-03-06 DIAGNOSIS — E1169 Type 2 diabetes mellitus with other specified complication: Secondary | ICD-10-CM

## 2021-03-06 DIAGNOSIS — E230 Hypopituitarism: Secondary | ICD-10-CM | POA: Diagnosis not present

## 2021-03-06 DIAGNOSIS — E669 Obesity, unspecified: Secondary | ICD-10-CM | POA: Diagnosis not present

## 2021-03-06 DIAGNOSIS — E039 Hypothyroidism, unspecified: Secondary | ICD-10-CM

## 2021-03-06 LAB — HEMOGLOBIN A1C: Hgb A1c MFr Bld: 6.3 % (ref 4.6–6.5)

## 2021-03-06 LAB — TSH: TSH: 2.31 u[IU]/mL (ref 0.35–4.50)

## 2021-03-06 LAB — TESTOSTERONE: Testosterone: 428.49 ng/dL (ref 300.00–890.00)

## 2021-03-06 LAB — T4, FREE: Free T4: 0.96 ng/dL (ref 0.60–1.60)

## 2021-03-08 NOTE — Progress Notes (Signed)
Patient ID: Zachary Murray, male   DOB: [birth year], 55 y.o.   MRN: [id number]              Chief complaint: Endocrinology follow-up  History of Present Illness  PROBLEM 1  Hypogonadismwas first diagnosed in 11/18  Since about 2016 or so he has had complaints offatigue He also feels malaise and listless, mostly in the afternoons and did not feel like doing anything at that time Also has some decreased libido  The diagnosis was confirmed by the low free T level of 6.6 done in 5/19 [facility] was inappropriately low at 6.3  He was on a trial of CLOMIPHENE 25 mg 3 times a week With this his energy level and libido were improving but only partially This is despite his testosterone level being as high as 661, his LH had gone up to 20  Because of the lack of adequate symptomatic improvement he is taking AndroGel since 9/19 Appears to be requiring relatively large doses of AndroGel up to 6 pumps a day  With testosterone gel he was feeling significantly better with his energy level overall His dose has been adjusted periodic However because of his hemoglobin going up to 18.3 in 03/2019 he was told to reduce the dose to 4 pumps daily He was taking 5 pumps a day on the AndroGel in November when he was seen but because of fatigue and testosterone level of only 212 he was tried on [place] but he could not continue this because of cost  RECENT HISTORY:  In 2021 he had requested trying clomiphene because of fatigue and he felt it had helped his fatigue and libido Since his LH level had gone up to 19 AndroGel was added and clomiphene was reduced to twice a week With AndroGel he is taking 2 pumps daily as prescribed regularly  He has been quite consistent with taking his clomiphene twice a week in the evenings  More recently has been feeling fairly good with his energy level, usually is testosterone levels do not correlate with his symptoms of fatigue  Previously his hemoglobin has been as high as 18  but is now consistently normal He does try to donate blood up to every 2 months  Testosterone is 428  Lab Results  Component Value Date   TESTOSTERONE 428.49 03/06/2021   TESTOSTERONE 544.48 10/27/2020   TESTOSTERONE 405.32 08/29/2020   TESTOSTERONE 509.06 06/07/2020    Prolactin level: 5.8  Lab Results  Component Value Date   LH 19.08 (H) 06/07/2020   Lab Results  Component Value Date   HGB 14.9 02/27/2021    PROBLEM 2: DIABETES:  Diagnosis date: ?  2016  Previous history:  Although he was diagnosed to have diabetes in 2016 he was not able to tolerate metformin that was previously prescribed Also was tried on [place] in 2017 and 2018 with some improved control He was tried on Bydureon in 06/2017 and continued until mil 3/19 d He was started on [place] insulin in 01/2018 by his PCP when his A1c was 7.8 and fasting glucose 165 Insulin was stopped in 5/19 since his blood sugar was averaging only 92 on his freestyle sensor at home  Recent history:     His most recent A1c is 6.3 compared to 5.9  Current management: Ozempic 1 mg weekly   He has been taking Ozempic 1 mg without any side effects  However occasionally may be late in taking the dose on a weekly basis  Not clear why his blood sugars are relatively higher but he thinks sometimes he may have some candy at night  Usually portions are small  Recently is trying to get more active with yard work  Has been monitoring blood sugars by rotation at different times with overall good numbers  Blood sugar may be higher fasting if checked later in the morning instead of around 5: 30-6 AM       Monitors blood glucose:   One Touch Verio meter         Blood Glucose readings from download   PRE-MEAL Fasting Lunch Dinner Bedtime Overall  Glucose range:  96-156    141   Mean/median:  122     121   POST-MEAL PC Breakfast PC Lunch PC Dinner  Glucose range:   102, 128  96-155  Mean/median:   124  127      Previously  PRE-MEAL Fasting Lunch Dinner  overnight Overall  Glucose range:  123  105  106  66, 81, 90   Mean/median:      101   POST-MEAL PC Breakfast PC Lunch  bedtime  Glucose range:    83-156  Mean/median:    97     Side effects from medications:  Diarrhea from metformin         Meals:  Usually low-fat                    Dietician visit:   Never  Weight control:  Wt Readings from Last 3 Encounters:  03/09/21 (!) 354 lb 9.6 oz (160.8 kg)  02/27/21 (!) 351 lb 3.2 oz (159.3 kg)  11/01/20 (!) 351 lb (159.2 kg)   Lab Results  Component Value Date   HGBA1C 6.3 03/06/2021   HGBA1C 5.9 10/27/2020   HGBA1C 6.6 (H) 06/07/2020   Lab Results  Component Value Date   MICROALBUR 4.1 (H) 08/29/2020   LDLCALC 63 12/27/2019   CREATININE 1.04 02/27/2021    HYPOTHYROIDISM:  He was previously given levothyroxine for high TSH in 12/16 but later stopped because of improved TSH  Because of his fatigue he had been started on levothyroxine in 3/19 with only slight improvement in his fatigue  He has been taking 75 mcg of levothyroxine and has been on the same dose since July 2019  TSH and free T4 consistently normal as below     Lab Results  Component Value Date   TSH 2.31 03/06/2021   TSH 2.10 06/07/2020   TSH 2.43 12/27/2019   FREET4 0.96 03/06/2021   FREET4 1.16 06/07/2020   FREET4 1.41 09/21/2019        Allergies as of 03/09/2021      Reactions   Metformin And Related Diarrhea   Wellbutrin [bupropion] Other (See Comments)   tremors   Lisinopril Cough      Medication List       Accurate as of March 09, 2021  8:27 AM. If you have any questions, ask your nurse or doctor.        ALPRAZolam 0.5 MG tablet Commonly known as: XANAX TAKE 1 TABLET(0.5 MG) BY MOUTH THREE TIMES DAILY AS NEEDED FOR ANXIETY   cilostazol 100 MG tablet Commonly known as: PLETAL TAKE 1 TABLET(100 MG) BY MOUTH TWICE DAILY   clomiPHENE 50 MG tablet Commonly known as:  CLOMID TAKE 1/2 TABLET BY MOUTH 3 TIMES A WEEK   furosemide 20 MG tablet Commonly known as: LASIX Take 1 tablet (20 mg total)  by mouth 2 (two) times daily as needed for fluid or edema.   GLUCOSAMINE 1500 COMPLEX PO Take 1 capsule by mouth daily.   glucose blood test strip Use onetouch verio flex test strip as instructed to check blood sugar once daily.   OneTouch Verio test strip Generic drug: glucose blood 1 each by Other route as needed for other. Use as instructed   Insulin Pen Needle 31G X 6 MM Misc Commonly known as: ReliOn Pen Needles Use as needed for insulin injection.   levothyroxine 75 MCG tablet Commonly known as: SYNTHROID TAKE 1 TABLET(75 MCG) BY MOUTH DAILY   onetouch ultrasoft lancets Use onetouch delica lancet to check blood sugar once daily.   OneTouch Delica Plus Lancet33G Misc Use to check blood sugar once a day   OneTouch Verio Flex System w/Device Kit Use as instructed to check blood sugar once daily.   Ozempic (1 MG/DOSE) 4 MG/3ML Sopn Generic drug: Semaglutide (1 MG/DOSE) Inject 1 mg into the skin once a week.   potassium chloride SA 20 MEQ tablet Commonly known as: KLOR-CON Take 1 tablet (20 mEq total) by mouth daily. Use as directed with Furosemide   primidone 50 MG tablet Commonly known as: MYSOLINE Take 1 tablet (50 mg total) by mouth at bedtime.   propranolol 40 MG tablet Commonly known as: INDERAL TAKE 1 TABLET(40 MG) BY MOUTH THREE TIMES DAILY   Testosterone 1.62 % Gel APPLY 1 PUMP ON EACH UPPER ARM EVERY DAY   triamterene-hydrochlorothiazide 37.5-25 MG tablet Commonly known as: MAXZIDE-25 Take 1 tablet by mouth daily.   Vitamin D (Ergocalciferol) 1.25 MG (50000 UNIT) Caps capsule Commonly known as: DRISDOL TAKE 1 CAPSULE BY MOUTH 1 TIME A WEEK       Allergies:  Allergies  Allergen Reactions  . Metformin And Related Diarrhea  . Wellbutrin [Bupropion] Other (See Comments)    tremors  . Lisinopril Cough    Past  Medical History:  Diagnosis Date  . Hypertension   . Obesity   . OSA on CPAP   . Type II diabetes mellitus with manifestations [facility])     Past Surgical History:  Procedure Laterality Date  . BIOPSY  08/31/2018   Procedure: BIOPSY;  Surgeon: Napoleon Form, MD;  [facility];  Service: Endoscopy;;  . COLONOSCOPY WITH PROPOFOL N/A 11/12/2016   Procedure: COLONOSCOPY WITH PROPOFOL;  Surgeon: Napoleon Form, MD;  [facility];  Service: Endoscopy;  Laterality: N/A;  . COLONOSCOPY WITH PROPOFOL N/A 08/31/2018   Procedure: COLONOSCOPY WITH PROPOFOL;  Surgeon: Napoleon Form, MD;  [facility];  Service: Endoscopy;  Laterality: N/A;  . POLYPECTOMY  08/31/2018   Procedure: POLYPECTOMY;  Surgeon: Napoleon Form, MD;  [facility];  Service: Endoscopy;;  . WISDOM TOOTH EXTRACTION      Family History  Problem Relation Age of Onset  . Colon cancer Father   . Hypertension Father   . Thyroid cancer Mother   . Colon polyps Mother   . Thyroid disease Mother   . Thyroid disease Sister   . Thyroid disease Maternal Grandmother   . Alcohol abuse Neg Hx   . COPD Neg Hx   . Diabetes Neg Hx   . Early death Neg Hx   . Hearing loss Neg Hx   . Heart disease Neg Hx   . Hyperlipidemia Neg Hx   . Kidney disease Neg Hx   . Stroke Neg Hx   . Esophageal cancer Neg Hx  Social History:  reports that he has never smoked. He has quit using smokeless tobacco.  His smokeless tobacco use included chew. He reports that he does not drink alcohol and does not use drugs.  Review of Systems    LIPIDS: LDL below 100, not on a statin drug Followed by PCP  Lab Results  Component Value Date   CHOL 121 12/27/2019   HDL 48.70 12/27/2019   LDLCALC 63 12/27/2019   TRIG 48.0 12/27/2019   CHOLHDL 2 12/27/2019     General Examination:   BP 130/84   Pulse 72   Ht 6' (1.829 m)   Wt (!) 354 lb 9.6 oz (160.8 kg)   SpO2 99%   BMI 48.09 kg/m    Trace lower leg edema Superficial veins present on lower legs  Diabetic Foot Exam - Simple   Simple Foot Form Diabetic Foot exam was performed with the following findings: Yes   Visual Inspection No deformities, no ulcerations, no other skin breakdown bilaterally: Yes See comments: Yes Sensation Testing Intact to touch and monofilament testing bilaterally: Yes Pulse Check Posterior Tibialis and Dorsalis pulse intact bilaterally: Yes Comments Callus formation lateral left great toe      Assessment/ Plan:   HYPOGONADOTROPIC HYPOGONADISM with symptoms of fatigue and low free testosterone at baseline  He has been symptomatic with fatigue and decreased libido  Currently on clomiphene 2 days a week with AndroGel 1 pump on each arm daily Testosterone levels have been mostly in the 400-500 range consistently He thinks his libido is better with clomiphene His symptoms of fatigue do not correlate with his testosterone level  No increase in hemoglobin but he also periodically does blood donation  DIABETES with morbid obesity  This is mild and relatively stable although A1c is slightly higher at 6.3 May have occasional higher readings fasting compared to the previous visit He is recently starting to start being more active outside Highest blood sugar 156 at home recently  Also consider nutrition consultation  Hypothyroidism: Adequately replaced   There are no Patient Instructions on file for this visit.  Elayne Snare 03/09/2021, 8:27 AM      Note: This office note was prepared with Dragon voice recognition system technology. Any transcriptional errors that result from this process are unintentional.     Elayne Snare 03/09/2021, 8:27 AM

## 2021-03-09 ENCOUNTER — Ambulatory Visit: Payer: BC Managed Care – PPO | Admitting: Endocrinology

## 2021-03-09 ENCOUNTER — Encounter: Payer: Self-pay | Admitting: Endocrinology

## 2021-03-09 ENCOUNTER — Other Ambulatory Visit: Payer: Self-pay

## 2021-03-09 VITALS — BP 130/84 | HR 72 | Ht 72.0 in | Wt 354.6 lb

## 2021-03-09 DIAGNOSIS — E230 Hypopituitarism: Secondary | ICD-10-CM

## 2021-03-09 DIAGNOSIS — E1169 Type 2 diabetes mellitus with other specified complication: Secondary | ICD-10-CM

## 2021-03-09 DIAGNOSIS — E669 Obesity, unspecified: Secondary | ICD-10-CM

## 2021-03-09 DIAGNOSIS — E039 Hypothyroidism, unspecified: Secondary | ICD-10-CM | POA: Diagnosis not present

## 2021-03-18 DIAGNOSIS — I872 Venous insufficiency (chronic) (peripheral): Secondary | ICD-10-CM

## 2021-03-18 HISTORY — DX: Venous insufficiency (chronic) (peripheral): I87.2

## 2021-03-20 ENCOUNTER — Other Ambulatory Visit: Payer: Self-pay

## 2021-03-20 DIAGNOSIS — I83899 Varicose veins of unspecified lower extremities with other complications: Secondary | ICD-10-CM

## 2021-03-29 DIAGNOSIS — H40013 Open angle with borderline findings, low risk, bilateral: Secondary | ICD-10-CM | POA: Diagnosis not present

## 2021-03-29 DIAGNOSIS — H2513 Age-related nuclear cataract, bilateral: Secondary | ICD-10-CM | POA: Diagnosis not present

## 2021-03-29 DIAGNOSIS — E119 Type 2 diabetes mellitus without complications: Secondary | ICD-10-CM | POA: Diagnosis not present

## 2021-03-29 DIAGNOSIS — H25013 Cortical age-related cataract, bilateral: Secondary | ICD-10-CM | POA: Diagnosis not present

## 2021-03-29 LAB — HM DIABETES EYE EXAM

## 2021-03-30 ENCOUNTER — Ambulatory Visit: Payer: BC Managed Care – PPO | Admitting: Vascular Surgery

## 2021-03-30 ENCOUNTER — Other Ambulatory Visit: Payer: Self-pay | Admitting: Endocrinology

## 2021-03-30 ENCOUNTER — Encounter: Payer: Self-pay | Admitting: Vascular Surgery

## 2021-03-30 ENCOUNTER — Other Ambulatory Visit: Payer: Self-pay

## 2021-03-30 ENCOUNTER — Other Ambulatory Visit: Payer: Self-pay | Admitting: Internal Medicine

## 2021-03-30 ENCOUNTER — Ambulatory Visit (HOSPITAL_COMMUNITY)
Admission: RE | Admit: 2021-03-30 | Discharge: 2021-03-30 | Disposition: A | Discharge status: Home / Self Care | Payer: BC Managed Care – PPO | Source: Ambulatory Visit | Attending: Vascular Surgery | Admitting: Vascular Surgery

## 2021-03-30 VITALS — BP 108/76 | HR 84 | Temp 98.6°F | Resp 20 | Ht 72.0 in | Wt 351.0 lb

## 2021-03-30 DIAGNOSIS — I10 Essential (primary) hypertension: Secondary | ICD-10-CM

## 2021-03-30 DIAGNOSIS — I83899 Varicose veins of unspecified lower extremities with other complications: Secondary | ICD-10-CM

## 2021-03-30 DIAGNOSIS — F411 Generalized anxiety disorder: Secondary | ICD-10-CM

## 2021-03-30 NOTE — Progress Notes (Signed)
Patient ID: Zachary Murray, male   DOB: [birth year], 55 y.o.   MRN: [id number]  Reason for Consult: New Patient (Initial Visit)   Referred by Zachary Grandchild, MD  Subjective:     HPI:  Zachary Murray is a 55 y.o. male without significant vascular history.  He does have hypertension type 2 diabetes.  He states that he has had varicose veins for multiple years.  His mother has varicose veins.  He has had 2 episodes of bleeding most significantly in the right leg then he had cauterization of a concerning vein in the left which also blood.  This required significant pressure and taping.  He has not had any history of DVT.  He does have swelling of his bilateral lower extremities.  He has lost weight recently. He is on maxide.  Past Medical History:  Diagnosis Date  . Hypertension   . Obesity   . OSA on CPAP   . Type II diabetes mellitus with manifestations (HCC)    Family History  Problem Relation Age of Onset  . Colon cancer Father   . Hypertension Father   . Thyroid cancer Mother   . Colon polyps Mother   . Thyroid disease Mother   . Thyroid disease Sister   . Thyroid disease Maternal Grandmother   . Alcohol abuse Neg Hx   . COPD Neg Hx   . Diabetes Neg Hx   . Early death Neg Hx   . Hearing loss Neg Hx   . Heart disease Neg Hx   . Hyperlipidemia Neg Hx   . Kidney disease Neg Hx   . Stroke Neg Hx   . Esophageal cancer Neg Hx    Past Surgical History:  Procedure Laterality Date  . BIOPSY  08/31/2018   Procedure: BIOPSY;  Surgeon: Zachary Form, MD;  Location: [facility];  Service: Endoscopy;;  . COLONOSCOPY WITH PROPOFOL N/A 11/12/2016   Procedure: COLONOSCOPY WITH PROPOFOL;  Surgeon: Zachary Form, MD;  Location: [facility];  Service: Endoscopy;  Laterality: N/A;  . COLONOSCOPY WITH PROPOFOL N/A 08/31/2018   Procedure: COLONOSCOPY WITH PROPOFOL;  Surgeon: Zachary Form, MD;  Location: [facility];  Service: Endoscopy;  Laterality: N/A;  . POLYPECTOMY   08/31/2018   Procedure: POLYPECTOMY;  Surgeon: Zachary Form, MD;  Location: [facility];  Service: Endoscopy;;  . WISDOM TOOTH EXTRACTION      Short Social History:  Social History   Tobacco Use  . Smoking status: Never Smoker  . Smokeless tobacco: Former Neurosurgeon    Types: Chew  Substance Use Topics  . Alcohol use: No    Alcohol/week: 0.0 standard drinks    Comment: hx of alcohol abuse - quit 2017    Allergies  Allergen Reactions  . Metformin And Related Diarrhea  . Wellbutrin [Bupropion] Other (See Comments)    tremors  . Lisinopril Cough    Current Outpatient Medications  Medication Sig Dispense Refill  . ALPRAZolam (XANAX) 0.5 MG tablet TAKE 1 TABLET(0.5 MG) BY MOUTH THREE TIMES DAILY AS NEEDED FOR ANXIETY 270 tablet 0  . Blood Glucose Monitoring Suppl (ONETOUCH VERIO FLEX SYSTEM) w/Device KIT Use as instructed to check blood sugar once daily. 1 kit 0  . cilostazol (PLETAL) 100 MG tablet TAKE 1 TABLET(100 MG) BY MOUTH TWICE DAILY 180 tablet 0  . clomiPHENE (CLOMID) 50 MG tablet TAKE 1/2 TABLET BY MOUTH 3 TIMES A WEEK 20 tablet 1  . Glucosamine-Chondroit-Vit C-Mn (GLUCOSAMINE 1500 COMPLEX PO) Take  1 capsule by mouth daily.     Marland Kitchen glucose blood (ONETOUCH VERIO) test strip 1 each by Other route as needed for other. Use as instructed 100 each 1  . glucose blood test strip Use onetouch verio flex test strip as instructed to check blood sugar once daily. 100 each 3  . Insulin Pen Needle (RELION PEN NEEDLES) 31G X 6 MM MISC Use as needed for insulin injection. 100 each 3  . Lancets (ONETOUCH DELICA PLUS [id number]) MISC Use to check blood sugar once a day 100 each 1  . Lancets (ONETOUCH ULTRASOFT) lancets Use onetouch delica lancet to check blood sugar once daily. 100 each 3  . levothyroxine (SYNTHROID) 75 MCG tablet TAKE 1 TABLET(75 MCG) BY MOUTH DAILY 90 tablet 0  . primidone (MYSOLINE) 50 MG tablet Take 1 tablet (50 mg total) by mouth at bedtime. 90 tablet 1  . propranolol  (INDERAL) 40 MG tablet TAKE 1 TABLET(40 MG) BY MOUTH THREE TIMES DAILY 270 tablet 1  . Semaglutide, 1 MG/DOSE, (OZEMPIC, 1 MG/DOSE,) 4 MG/3ML SOPN Inject 1 mg into the skin once a week. 3 mL 2  . Testosterone 1.62 % GEL APPLY 1 PUMP ON EACH UPPER ARM EVERY DAY 75 g 2  . triamterene-hydrochlorothiazide (MAXZIDE-25) 37.5-25 MG tablet Take 1 tablet by mouth daily. 90 tablet 0  . Vitamin D, Ergocalciferol, (DRISDOL) 1.25 MG (50000 UNIT) CAPS capsule TAKE 1 CAPSULE BY MOUTH 1 TIME A WEEK 12 capsule 0   No current facility-administered medications for this visit.    Review of Systems  Constitutional:  Constitutional negative. HENT: HENT negative.  Eyes: Eyes negative.  Cardiovascular: Cardiovascular negative.  GI: Gastrointestinal negative.  Musculoskeletal: Musculoskeletal negative.  Skin: Skin negative.  Neurological: Neurological negative. Hematologic: Positive for bruises/bleeds easily.  Psychiatric: Psychiatric negative.        Objective:  Objective   Vitals:   03/30/21 1058  BP: 108/76  Pulse: 84  Resp: 20  Temp: 98.6 F (37 C)  SpO2: 93%  Weight: (!) 351 lb (159.2 kg)  Height: 6' (1.829 m)   Body mass index is 47.6 kg/m.  Physical Exam HENT:     Head: Normocephalic.     Nose:     Comments: Wearing a mask Eyes:     Pupils: Pupils are equal, round, and reactive to light.  Cardiovascular:     Rate and Rhythm: Normal rate.     Pulses: Normal pulses.  Pulmonary:     Effort: Pulmonary effort is normal.  Abdominal:     General: Abdomen is flat.     Palpations: Abdomen is soft.  Musculoskeletal:        General: Swelling present. Normal range of motion.     Right lower leg: Edema present.     Left lower leg: Edema present.     Comments: Bilateral lower extremity varicose veins and darkened skin discoloration bilateral ankles  Skin:    General: Skin is warm and dry.  Neurological:     General: No focal deficit present.     Mental Status: He is alert.   Psychiatric:        Mood and Affect: Mood normal.        Behavior: Behavior normal.        Thought Content: Thought content normal.        Judgment: Judgment normal.     Data: Venous Reflux Times  +----------------------+---------+------+-----------+------------+---------  ----+  RIGHT         Reflux NoRefluxReflux TimeDiameter  cmsComments                      Yes                      +----------------------+---------+------+-----------+------------+---------  ----+  CFV          no                            +----------------------+---------+------+-----------+------------+---------  ----+  FV mid        no                            +----------------------+---------+------+-----------+------------+---------  ----+  Popliteal       no                            +----------------------+---------+------+-----------+------------+---------  ----+  GSV at [facility]            yes  >500 ms   0.67           +----------------------+---------+------+-----------+------------+---------  ----+  GSV prox thigh          yes  >500 ms   0.68           +----------------------+---------+------+-----------+------------+---------  ----+  GSV mid thigh           yes  >500 ms   0.45           +----------------------+---------+------+-----------+------------+---------  ----+  GSV dist thigh          yes  >500 ms   0.39           +----------------------+---------+------+-----------+------------+---------  ----+  GSV at knee            yes  >500 ms   0.41  out of  fascia  +----------------------+---------+------+-----------+------------+---------  ----+  GSV prox calf            yes  >500 ms   0.48  out of  fascia  +----------------------+---------+------+-----------+------------+---------  ----+  SSV Pop Fossa     no               0.49           +----------------------+---------+------+-----------+------------+---------  ----+  SSV prox calf           yes  >500 ms   0.39           +----------------------+---------+------+-----------+------------+---------  ----+  SSV mid calf     no               0.25           +----------------------+---------+------+-----------+------------+---------  ----+  Distal calf perforator      yes  >500 ms                +----------------------+---------+------+-----------+------------+---------  ----+     +-------------------+---------+------+-----------+------------+--------+  LEFT        Reflux NoRefluxReflux TimeDiameter cmsComments                  Yes                   +-------------------+---------+------+-----------+------------+--------+  CFV              yes  >1 second             +-------------------+---------+------+-----------+------------+--------+  FV mid  no                         +-------------------+---------+------+-----------+------------+--------+  Popliteal     no                         +-------------------+---------+------+-----------+------------+--------+  GSV at [facility]           yes  >500 ms   0.96        +-------------------+---------+------+-----------+------------+--------+  GSV prox thigh         yes  >500 ms   0.63        +-------------------+---------+------+-----------+------------+--------+  GSV mid thigh         yes  >500 ms   0.46         +-------------------+---------+------+-----------+------------+--------+  GSV dist thigh         yes  >500 ms   0.47        +-------------------+---------+------+-----------+------------+--------+  GSV at knee          yes  >500 ms   0.55        +-------------------+---------+------+-----------+------------+--------+  GSV prox calf         yes  >500 ms   0.51        +-------------------+---------+------+-----------+------------+--------+  SSV Pop Fossa   no               0.56        +-------------------+---------+------+-----------+------------+--------+  SSV prox calf   no               0.41        +-------------------+---------+------+-----------+------------+--------+  SSV mid calf    no               0.33        +-------------------+---------+------+-----------+------------+--------+  Mid calf perforator      yes  >500 ms             +-------------------+---------+------+-----------+------------+--------+    Summary:  Bilateral:  - No evidence of deep vein thrombosis seen in the lower extremities,  bilaterally, from the common femoral through the popliteal veins.    Right:  - Color duplex evaluation of the right lower extremity shows there is  thrombus in the distal greater saphenous vein.  - Venous reflux is noted in the right sapheno-femoral junction.  - Venous reflux is noted in the right greater saphenous vein in the thigh.  - Venous reflux is noted in the right greater saphenous vein in the calf.  - Venous reflux is noted in the right short saphenous vein.  - Venous reflux is noted in the right calf perforator vein.    Left:  - Color duplex evaluation of the left lower extremity shows there is  thrombus in the distal greater saphenous vein and proximal greater  saphenous vein.  -  Venous reflux is noted in the left common femoral vein.  - Venous reflux is noted in the left sapheno-femoral junction.  - Venous reflux is noted in the left greater saphenous vein in the thigh.  - Venous reflux is noted in the left greater saphenous vein in the calf.  - Venous reflux is noted in the left calf perforator vein.        Assessment/Plan:     55 year old male with C4 venous disease and 2 episodes of bilateral lower extremity varicosity bleeding.  He has significant varicose veins spider veins throughout his lower extremities with skin changes.  He has large refluxing bilateral greater saphenous veins.  We have discussed the need for thigh-high compression stockings and the timing to wear these.  I have also discussed the need for fluid management and weight loss which he understands.  We will see him back in 3 months to gauge his improvement with compression stockings and consider saphenous vein ablation.  The left leg is certainly the worst leg at this time and would also require stab phlebectomy.  We have discussed the procedure at length and he demonstrates good understanding.     Waynetta Sandy MD [facility]

## 2021-03-31 ENCOUNTER — Other Ambulatory Visit: Payer: Self-pay | Admitting: Endocrinology

## 2021-04-06 ENCOUNTER — Other Ambulatory Visit: Payer: Self-pay | Admitting: Internal Medicine

## 2021-04-06 DIAGNOSIS — E559 Vitamin D deficiency, unspecified: Secondary | ICD-10-CM

## 2021-04-17 ENCOUNTER — Other Ambulatory Visit: Payer: Self-pay | Admitting: Internal Medicine

## 2021-04-17 ENCOUNTER — Telehealth: Payer: Self-pay

## 2021-04-17 DIAGNOSIS — E039 Hypothyroidism, unspecified: Secondary | ICD-10-CM

## 2021-04-17 DIAGNOSIS — E559 Vitamin D deficiency, unspecified: Secondary | ICD-10-CM

## 2021-04-17 MED ORDER — VITAMIN D (ERGOCALCIFEROL) 1.25 MG (50000 UNIT) PO CAPS: 50000.0000 [IU] | ORAL_CAPSULE | ORAL | 0 refills | Status: DC

## 2021-04-17 MED ORDER — LEVOTHYROXINE SODIUM 75 MCG PO TABS: 75.0000 ug | ORAL_TABLET | Freq: Every day | ORAL | 1 refills | Status: DC

## 2021-04-17 NOTE — Telephone Encounter (Signed)
PA for Ozempic has been approved Effective from 04/17/2021 through 04/16/2022.

## 2021-04-18 ENCOUNTER — Other Ambulatory Visit: Payer: Self-pay | Admitting: Neurology

## 2021-06-06 ENCOUNTER — Other Ambulatory Visit: Payer: Self-pay | Admitting: Internal Medicine

## 2021-06-06 ENCOUNTER — Other Ambulatory Visit: Payer: Self-pay | Admitting: Endocrinology

## 2021-06-06 DIAGNOSIS — I739 Peripheral vascular disease, unspecified: Secondary | ICD-10-CM

## 2021-06-30 ENCOUNTER — Other Ambulatory Visit: Payer: Self-pay | Admitting: Internal Medicine

## 2021-06-30 DIAGNOSIS — F411 Generalized anxiety disorder: Secondary | ICD-10-CM

## 2021-06-30 DIAGNOSIS — I10 Essential (primary) hypertension: Secondary | ICD-10-CM

## 2021-07-15 ENCOUNTER — Other Ambulatory Visit: Payer: Self-pay | Admitting: Internal Medicine

## 2021-07-15 DIAGNOSIS — E559 Vitamin D deficiency, unspecified: Secondary | ICD-10-CM

## 2021-07-17 ENCOUNTER — Other Ambulatory Visit: Payer: Self-pay | Admitting: Internal Medicine

## 2021-07-17 ENCOUNTER — Encounter: Payer: Self-pay | Admitting: Internal Medicine

## 2021-07-17 ENCOUNTER — Ambulatory Visit: Payer: BC Managed Care – PPO | Admitting: Endocrinology

## 2021-07-17 DIAGNOSIS — E559 Vitamin D deficiency, unspecified: Secondary | ICD-10-CM

## 2021-07-18 ENCOUNTER — Encounter: Payer: Self-pay | Admitting: Internal Medicine

## 2021-07-19 ENCOUNTER — Other Ambulatory Visit: Payer: BC Managed Care – PPO

## 2021-07-24 ENCOUNTER — Ambulatory Visit: Payer: BC Managed Care – PPO | Admitting: Endocrinology

## 2021-07-30 DIAGNOSIS — H04123 Dry eye syndrome of bilateral lacrimal glands: Secondary | ICD-10-CM | POA: Diagnosis not present

## 2021-07-30 DIAGNOSIS — H40013 Open angle with borderline findings, low risk, bilateral: Secondary | ICD-10-CM | POA: Diagnosis not present

## 2021-08-02 ENCOUNTER — Other Ambulatory Visit: Payer: BC Managed Care – PPO

## 2021-08-02 ENCOUNTER — Ambulatory Visit: Payer: BC Managed Care – PPO | Admitting: Vascular Surgery

## 2021-08-06 ENCOUNTER — Other Ambulatory Visit: Payer: Self-pay

## 2021-08-06 ENCOUNTER — Ambulatory Visit: Payer: BC Managed Care – PPO | Admitting: Internal Medicine

## 2021-08-06 ENCOUNTER — Encounter: Payer: Self-pay | Admitting: Internal Medicine

## 2021-08-06 VITALS — BP 122/70 | HR 74 | Temp 98.6°F | Ht 72.0 in | Wt 360.0 lb

## 2021-08-06 DIAGNOSIS — E785 Hyperlipidemia, unspecified: Secondary | ICD-10-CM

## 2021-08-06 DIAGNOSIS — E118 Type 2 diabetes mellitus with unspecified complications: Secondary | ICD-10-CM | POA: Diagnosis not present

## 2021-08-06 DIAGNOSIS — E559 Vitamin D deficiency, unspecified: Secondary | ICD-10-CM | POA: Diagnosis not present

## 2021-08-06 DIAGNOSIS — I10 Essential (primary) hypertension: Secondary | ICD-10-CM

## 2021-08-06 DIAGNOSIS — Z0000 Encounter for general adult medical examination without abnormal findings: Secondary | ICD-10-CM | POA: Diagnosis not present

## 2021-08-06 DIAGNOSIS — E039 Hypothyroidism, unspecified: Secondary | ICD-10-CM

## 2021-08-06 LAB — HEMOGLOBIN A1C: Hgb A1c MFr Bld: 7 % — ABNORMAL HIGH (ref 4.6–6.5)

## 2021-08-06 LAB — URINALYSIS, ROUTINE W REFLEX MICROSCOPIC
Hgb urine dipstick: NEGATIVE
Ketones, ur: NEGATIVE
Leukocytes,Ua: NEGATIVE
Nitrite: NEGATIVE
RBC / HPF: NONE SEEN (ref 0–?)
Specific Gravity, Urine: 1.025 (ref 1.000–1.030)
Total Protein, Urine: NEGATIVE
Urine Glucose: NEGATIVE
Urobilinogen, UA: 1 (ref 0.0–1.0)
pH: 6 (ref 5.0–8.0)

## 2021-08-06 LAB — HEPATIC FUNCTION PANEL
ALT: 20 U/L (ref 0–53)
AST: 21 U/L (ref 0–37)
Albumin: 4.3 g/dL (ref 3.5–5.2)
Alkaline Phosphatase: 55 U/L (ref 39–117)
Bilirubin, Direct: 0.3 mg/dL (ref 0.0–0.3)
Total Bilirubin: 1.1 mg/dL (ref 0.2–1.2)
Total Protein: 7.1 g/dL (ref 6.0–8.3)

## 2021-08-06 LAB — TSH: TSH: 2.16 u[IU]/mL (ref 0.35–5.50)

## 2021-08-06 LAB — LIPID PANEL
Cholesterol: 144 mg/dL (ref 0–200)
HDL: 37.5 mg/dL — ABNORMAL LOW (ref >39.00)
LDL Cholesterol: 92 mg/dL (ref 0–99)
NonHDL: 106.53
Total CHOL/HDL Ratio: 4
Triglycerides: 74 mg/dL (ref 0.0–149.0)
VLDL: 14.8 mg/dL (ref 0.0–40.0)

## 2021-08-06 LAB — MICROALBUMIN / CREATININE URINE RATIO
Creatinine,U: 152.7 mg/dL
Microalb Creat Ratio: 2.1 mg/g (ref 0.0–30.0)
Microalb, Ur: 3.3 mg/dL — ABNORMAL HIGH (ref 0.0–1.9)

## 2021-08-06 LAB — CBC WITH DIFFERENTIAL/PLATELET
Basophils Absolute: 0 10*3/uL (ref 0.0–0.1)
Basophils Relative: 0.6 % (ref 0.0–3.0)
Eosinophils Absolute: 0.2 10*3/uL (ref 0.0–0.7)
Eosinophils Relative: 3.1 % (ref 0.0–5.0)
HCT: 45.4 % (ref 39.0–52.0)
Hemoglobin: 15.5 g/dL (ref 13.0–17.0)
Lymphocytes Relative: 25.6 % (ref 12.0–46.0)
Lymphs Abs: 1.5 10*3/uL (ref 0.7–4.0)
MCHC: 34.1 g/dL (ref 30.0–36.0)
MCV: 87.7 fl (ref 78.0–100.0)
Monocytes Absolute: 0.6 10*3/uL (ref 0.1–1.0)
Monocytes Relative: 10.9 % (ref 3.0–12.0)
Neutro Abs: 3.5 10*3/uL (ref 1.4–7.7)
Neutrophils Relative %: 59.8 % (ref 43.0–77.0)
Platelets: 226 10*3/uL (ref 150.0–400.0)
RBC: 5.18 Mil/uL (ref 4.22–5.81)
RDW: 14.1 % (ref 11.5–15.5)
WBC: 5.9 10*3/uL (ref 4.0–10.5)

## 2021-08-06 LAB — BASIC METABOLIC PANEL
BUN: 13 mg/dL (ref 6–23)
CO2: 32 mEq/L (ref 19–32)
Calcium: 9.6 mg/dL (ref 8.4–10.5)
Chloride: 102 mEq/L (ref 96–112)
Creatinine, Ser: 0.95 mg/dL (ref 0.40–1.50)
GFR: 88.74 mL/min (ref >60.00)
Glucose, Bld: 109 mg/dL — ABNORMAL HIGH (ref 70–99)
Potassium: 4.2 mEq/L (ref 3.5–5.1)
Sodium: 141 mEq/L (ref 135–145)

## 2021-08-06 LAB — VITAMIN D 25 HYDROXY (VIT D DEFICIENCY, FRACTURES): VITD: 66.33 ng/mL (ref 30.00–100.00)

## 2021-08-06 LAB — PSA: PSA: 1.57 ng/mL (ref 0.10–4.00)

## 2021-08-06 MED ORDER — SEMAGLUTIDE (2 MG/DOSE) 8 MG/3ML ~~LOC~~ SOPN
2.0000 mg | PEN_INJECTOR | SUBCUTANEOUS | 1 refills | Status: DC
Start: 2021-08-06 — End: 2021-09-24

## 2021-08-06 NOTE — Progress Notes (Signed)
Subjective:  Patient ID: Zachary Murray, male    DOB: 12/17/1965  Age: 55 y.o. MRN: [id number]  CC: Annual Exam, Hypertension, Diabetes, Hypothyroidism, and Hyperlipidemia  This visit occurred during the SARS-CoV-2 public health emergency.  Safety protocols were in place, including screening questions prior to the visit, additional usage of staff PPE, and extensive cleaning of exam room while observing appropriate contact time as indicated for disinfecting solutions.    [name] presents for a CPX and f/up -   He denies chest pain, shortness of breath, palpitations, edema, or fatigue.  He has had some weight gain.  Outpatient Medications Prior to Visit  Medication Sig Dispense Refill   ALPRAZolam (XANAX) 0.5 MG tablet TAKE 1 TABLET(0.5 MG) BY MOUTH THREE TIMES DAILY AS NEEDED FOR ANXIETY 270 tablet 0   Blood Glucose Monitoring Suppl ([place]) w/Device KIT Use as instructed to check blood sugar once daily. 1 kit 0   cilostazol (PLETAL) 100 MG tablet TAKE 1 TABLET(100 MG) BY MOUTH TWICE DAILY 180 tablet 0   clomiPHENE (CLOMID) 50 MG tablet TAKE 1/2 TABLET BY MOUTH 3 TIMES A WEEK 20 tablet 1   Glucosamine-Chondroit-Vit C-Mn (GLUCOSAMINE 1500 COMPLEX PO) Take 1 capsule by mouth daily.      glucose blood (ONETOUCH VERIO) test strip 1 each by Other route as needed for other. Use as instructed 100 each 1   glucose blood test strip Use onetouch verio flex test strip as instructed to check blood sugar once daily. 100 each 3   Insulin Pen Needle (RELION PEN NEEDLES) 31G X 6 MM MISC Use as needed for insulin injection. 100 each 3   Lancets (ONETOUCH DELICA PLUS [id number]) MISC Use to check blood sugar once a day 100 each 1   Lancets (ONETOUCH ULTRASOFT) lancets Use onetouch delica lancet to check blood sugar once daily. 100 each 3   levothyroxine (SYNTHROID) 75 MCG tablet Take 1 tablet (75 mcg total) by mouth daily before breakfast. 90 tablet 1   primidone (MYSOLINE) 50 MG  tablet TAKE 1 TABLET(50 MG) BY MOUTH AT BEDTIME 90 tablet 0   propranolol (INDERAL) 40 MG tablet TAKE 1 TABLET(40 MG) BY MOUTH THREE TIMES DAILY 270 tablet 1   Testosterone 1.62 % GEL APPLY 1 PUMP ON EACH UPPER ARM EVERY DAY 75 g 5   triamterene-hydrochlorothiazide (MAXZIDE-25) 37.5-25 MG tablet TAKE 1 TABLET BY MOUTH DAILY 90 tablet 0   OZEMPIC, 1 MG/DOSE, 4 MG/3ML SOPN INJECT $RemoveBef'1MG'RjktDQTqcf$  UNDER THE SKIN ONCE A WEEK 3 mL 2   Vitamin D, Ergocalciferol, (DRISDOL) 1.25 MG (50000 UNIT) CAPS capsule Take 1 capsule (50,000 Units total) by mouth every 7 (seven) days. 12 capsule 0   No facility-administered medications prior to visit.    ROS Review of Systems  Constitutional:  Positive for unexpected weight change (wt gain). Negative for chills, diaphoresis and fatigue.  HENT: Negative.    Eyes: Negative.   Respiratory:  Negative for cough, shortness of breath and wheezing.   Cardiovascular:  Negative for chest pain, palpitations and leg swelling.  Gastrointestinal:  Negative for abdominal pain, constipation, diarrhea, nausea and vomiting.  Endocrine: Negative.   Genitourinary: Negative.  Negative for difficulty urinating, frequency and hematuria.  Musculoskeletal: Negative.  Negative for arthralgias and myalgias.  Skin: Negative.   Neurological: Negative.  Negative for dizziness, weakness, light-headedness and numbness.  Hematological:  Negative for adenopathy. Does not bruise/bleed easily.  Psychiatric/Behavioral: Negative.     Objective:  BP 122/70 (BP  Location: Right Arm, Patient Position: Sitting, Cuff Size: Large)   Pulse 74   Temp 98.6 F (37 C) (Oral)   Ht 6' (1.829 m)   Wt (!) 360 lb (163.3 kg)   SpO2 95%   BMI 48.82 kg/m   BP Readings from Last 3 Encounters:  08/06/21 122/70  03/30/21 108/76  03/09/21 130/84    Wt Readings from Last 3 Encounters:  08/06/21 (!) 360 lb (163.3 kg)  03/30/21 (!) 351 lb (159.2 kg)  03/09/21 (!) 354 lb 9.6 oz (160.8 kg)    Physical Exam Vitals  reviewed.  Constitutional:      Appearance: He is obese.  HENT:     Nose: Nose normal.     Mouth/Throat:     Mouth: Mucous membranes are moist.  Eyes:     Conjunctiva/sclera: Conjunctivae normal.  Cardiovascular:     Rate and Rhythm: Normal rate and regular rhythm.     Heart sounds: No murmur heard. Pulmonary:     Breath sounds: No stridor. No wheezing, rhonchi or rales.  Abdominal:     General: Abdomen is protuberant. Bowel sounds are normal. There is no distension.     Palpations: Abdomen is soft. There is no hepatomegaly, splenomegaly or mass.     Tenderness: There is no abdominal tenderness.  Musculoskeletal:        General: Normal range of motion.     Cervical back: Neck supple.     Right lower leg: No edema.     Left lower leg: No edema.  Lymphadenopathy:     Cervical: No cervical adenopathy.  Skin:    General: Skin is warm and dry.  Neurological:     General: No focal deficit present.     Mental Status: He is alert.  Psychiatric:        Mood and Affect: Mood normal.        Behavior: Behavior normal.    Lab Results  Component Value Date   WBC 5.9 08/06/2021   HGB 15.5 08/06/2021   HCT 45.4 08/06/2021   PLT 226.0 08/06/2021   GLUCOSE 109 (H) 08/06/2021   CHOL 144 08/06/2021   TRIG 74.0 08/06/2021   HDL 37.50 (L) 08/06/2021   LDLCALC 92 08/06/2021   ALT 20 08/06/2021   AST 21 08/06/2021   NA 141 08/06/2021   K 4.2 08/06/2021   CL 102 08/06/2021   CREATININE 0.95 08/06/2021   BUN 13 08/06/2021   CO2 32 08/06/2021   TSH 2.16 08/06/2021   PSA 1.57 08/06/2021   HGBA1C 7.0 (H) 08/06/2021   MICROALBUR 3.3 (H) 08/06/2021    VAS [place] LOWER EXTREMITY VENOUS REFLUX  Result Date: 03/30/2021  Lower Venous Reflux Study Patient Name:  Zachary Murray  Date of Exam:   03/30/2021 Medical Rec #: [id number]     Accession #:    [id number] Date of Birth: [birth year]     Patient Gender: M Patient Age:   55Y Exam Location:  [facility] Procedure:      VAS [place]  LOWER EXTREMITY VENOUS REFLUX Referring Phys: [id number] [place] Dom CAIN --------------------------------------------------------------------------------  Indications: Pain, Swelling, and varicosities.  Comparison Study: 02/27/2021: No evidence of deep vein thrombosis bilaterally.                   Age indeterminate venous thrombosis in the bilateral greater                   saphenous veins. Performing Technologist:  Ivan Croft  Examination Guidelines: A complete evaluation includes B-mode imaging, spectral Doppler, color Doppler, and power Doppler as needed of all accessible portions of each vessel. Bilateral testing is considered an integral part of a complete examination. Limited examinations for reoccurring indications may be performed as noted. The reflux portion of the exam is performed with the patient in reverse Trendelenburg. Significant venous reflux is defined as >500 ms in the superficial venous system, and >1 second in the deep venous system.  Venous Reflux Times +----------------------+---------+------+-----------+------------+-------------+ RIGHT                 Reflux NoRefluxReflux TimeDiameter cmsComments                                      Yes                                       +----------------------+---------+------+-----------+------------+-------------+ CFV                   no                                                  +----------------------+---------+------+-----------+------------+-------------+ FV mid                no                                                  +----------------------+---------+------+-----------+------------+-------------+ Popliteal             no                                                  +----------------------+---------+------+-----------+------------+-------------+ GSV at SFJ                      yes    >500 ms      0.67                   +----------------------+---------+------+-----------+------------+-------------+ GSV prox thigh                  yes    >500 ms      0.68                  +----------------------+---------+------+-----------+------------+-------------+ GSV mid thigh                   yes    >500 ms      0.45                  +----------------------+---------+------+-----------+------------+-------------+ GSV dist thigh                  yes    >500 ms      0.39                  +----------------------+---------+------+-----------+------------+-------------+ GSV at knee  yes    >500 ms      0.41    out of fascia +----------------------+---------+------+-----------+------------+-------------+ GSV prox calf                   yes    >500 ms      0.48    out of fascia +----------------------+---------+------+-----------+------------+-------------+ SSV Pop Fossa         no                            0.49                  +----------------------+---------+------+-----------+------------+-------------+ SSV prox calf                   yes    >500 ms      0.39                  +----------------------+---------+------+-----------+------------+-------------+ SSV mid calf          no                            0.25                  +----------------------+---------+------+-----------+------------+-------------+ Distal calf perforator          yes    >500 ms                            +----------------------+---------+------+-----------+------------+-------------+  +-------------------+---------+------+-----------+------------+--------+ LEFT               Reflux NoRefluxReflux TimeDiameter cmsComments                              Yes                                  +-------------------+---------+------+-----------+------------+--------+ CFV                          yes   >1 second                       +-------------------+---------+------+-----------+------------+--------+ FV mid             no                                             +-------------------+---------+------+-----------+------------+--------+ Popliteal          no                                             +-------------------+---------+------+-----------+------------+--------+ GSV at SFJ                   yes    >500 ms      0.96             +-------------------+---------+------+-----------+------------+--------+ GSV prox thigh               yes    >500 ms  0.63             +-------------------+---------+------+-----------+------------+--------+ GSV mid thigh                yes    >500 ms      0.46             +-------------------+---------+------+-----------+------------+--------+ GSV dist thigh               yes    >500 ms      0.47             +-------------------+---------+------+-----------+------------+--------+ GSV at knee                  yes    >500 ms      0.55             +-------------------+---------+------+-----------+------------+--------+ GSV prox calf                yes    >500 ms      0.51             +-------------------+---------+------+-----------+------------+--------+ SSV Pop Fossa      no                            0.56             +-------------------+---------+------+-----------+------------+--------+ SSV prox calf      no                            0.41             +-------------------+---------+------+-----------+------------+--------+ SSV mid calf       no                            0.33             +-------------------+---------+------+-----------+------------+--------+ Mid calf perforator          yes    >500 ms                       +-------------------+---------+------+-----------+------------+--------+   Summary: Bilateral: - No evidence of deep vein thrombosis seen in the lower extremities, bilaterally, from the common  femoral through the popliteal veins.  Right: - Color duplex evaluation of the right lower extremity shows there is thrombus in the distal greater saphenous vein. - Venous reflux is noted in the right sapheno-femoral junction. - Venous reflux is noted in the right greater saphenous vein in the thigh. - Venous reflux is noted in the right greater saphenous vein in the calf. - Venous reflux is noted in the right short saphenous vein. - Venous reflux is noted in the right calf perforator vein.  Left: - Color duplex evaluation of the left lower extremity shows there is thrombus in the distal greater saphenous vein and proximal greater saphenous vein. - Venous reflux is noted in the left common femoral vein. - Venous reflux is noted in the left sapheno-femoral junction. - Venous reflux is noted in the left greater saphenous vein in the thigh. - Venous reflux is noted in the left greater saphenous vein in the calf. - Venous reflux is noted in the left calf perforator vein.  *See table(s) above for measurements and observations. Electronically signed by Servando Snare MD on 03/30/2021 at 5:04:43 PM.    Final     Assessment &  Plan:   Monterrio was seen today for annual exam, hypertension, diabetes, hypothyroidism and hyperlipidemia.  Diagnoses and all orders for this visit:  Acquired hypothyroidism- His TSH is in the normal range.  He will stay on the current dose of levothyroxine. -     TSH; Future -     TSH  Type II diabetes mellitus with manifestations ([place])- His A1c is up to 7.0%.  I recommended that he double the dose of the GLP-1 agonist. -     Basic metabolic panel; Future -     Microalbumin / creatinine urine ratio; Future -     Hemoglobin A1c; Future -     Hemoglobin A1c -     Microalbumin / creatinine urine ratio -     Basic metabolic panel -     Semaglutide, 2 MG/DOSE, 8 MG/3ML SOPN; Inject 2 mg as directed once a week.  Hyperlipidemia with target LDL less than 130- I recommended that he take a  statin for cardiovascular risk reduction. -     TSH; Future -     Hepatic function panel; Future -     Hepatic function panel -     TSH  Routine general medical examination at a health care facility- Exam completed, labs reviewed, he deferred on the flu vaccine, cancer screenings are up-to-date, patient education material was given. -     Lipid panel; Future -     PSA; Future -     PSA -     Lipid panel  Vitamin D deficiency- His Vit D level is normal now. -     VITAMIN D 25 Hydroxy (Vit-D Deficiency, Fractures); Future -     VITAMIN D 25 Hydroxy (Vit-D Deficiency, Fractures)  Essential hypertension, benign- His BP is well controlled. -     CBC with Differential/Platelet; Future -     Urinalysis, Routine w reflex microscopic; Future -     Urinalysis, Routine w reflex microscopic -     CBC with Differential/Platelet  I have discontinued Aaron Mose. Gastelum "Ken"'s Ozempic (1 MG/DOSE). I am also having him start on Semaglutide (2 MG/DOSE) and rosuvastatin. Additionally, I am having him maintain his Insulin Pen Needle, onetouch ultrasoft, glucose blood, OneTouch Verio Flex System, Glucosamine-Chondroit-Vit C-Mn (GLUCOSAMINE 1500 COMPLEX PO), propranolol, OneTouch Verio, OneTouch Delica Plus [id number], clomiPHENE, Testosterone, levothyroxine, primidone, cilostazol, triamterene-hydrochlorothiazide, and ALPRAZolam.  Meds ordered this encounter  Medications   Semaglutide, 2 MG/DOSE, 8 MG/3ML SOPN    Sig: Inject 2 mg as directed once a week.    Dispense:  9 mL    Refill:  1   rosuvastatin (CRESTOR) 10 MG tablet    Sig: Take 1 tablet (10 mg total) by mouth daily.    Dispense:  90 tablet    Refill:  1      Follow-up: Return in about 6 months (around 02/03/2022).  Scarlette Calico, MD

## 2021-08-06 NOTE — Patient Instructions (Signed)
Health Maintenance, Male Adopting a healthy lifestyle and getting preventive care are important in promoting health and wellness. Ask your health care provider about: The right schedule for you to have regular tests and exams. Things you can do on your own to prevent diseases and keep yourself healthy. What should I know about diet, weight, and exercise? Eat a healthy diet  Eat a diet that includes plenty of vegetables, fruits, low-fat dairy products, and lean protein. Do not eat a lot of foods that are high in solid fats, added sugars, or sodium. Maintain a healthy weight Body mass index (BMI) is a measurement that can be used to identify possible weight problems. It estimates body fat based on height and weight. Your health care provider can help determine your BMI and help you achieve or maintain a healthy weight. Get regular exercise Get regular exercise. This is one of the most important things you can do for your health. Most adults should: Exercise for at least 150 minutes each week. The exercise should increase your heart rate and make you sweat (moderate-intensity exercise). Do strengthening exercises at least twice a week. This is in addition to the moderate-intensity exercise. Spend less time sitting. Even light physical activity can be beneficial. Watch cholesterol and blood lipids Have your blood tested for lipids and cholesterol at 57 years of age, then have this test every 5 years. You may need to have your cholesterol levels checked more often if: Your lipid or cholesterol levels are high. You are older than 57 years of age. You are at high risk for heart disease. What should I know about cancer screening? Many types of cancers can be detected early and may often be prevented. Depending on your health history and family history, you may need to have cancer screening at various ages. This may include screening for: Colorectal cancer. Prostate cancer. Skin cancer. Lung  cancer. What should I know about heart disease, diabetes, and high blood pressure? Blood pressure and heart disease High blood pressure causes heart disease and increases the risk of stroke. This is more likely to develop in people who have high blood pressure readings, are of African descent, or are overweight. Talk with your health care provider about your target blood pressure readings. Have your blood pressure checked: Every 3-5 years if you are 18-39 years of age. Every year if you are 40 years old or older. If you are between the ages of 65 and 75 and are a current or former smoker, ask your health care provider if you should have a one-time screening for abdominal aortic aneurysm (AAA). Diabetes Have regular diabetes screenings. This checks your fasting blood sugar level. Have the screening done: Once every three years after age 45 if you are at a normal weight and have a low risk for diabetes. More often and at a younger age if you are overweight or have a high risk for diabetes. What should I know about preventing infection? Hepatitis B If you have a higher risk for hepatitis B, you should be screened for this virus. Talk with your health care provider to find out if you are at risk for hepatitis B infection. Hepatitis C Blood testing is recommended for: Everyone born from 1945 through 1965. Anyone with known risk factors for hepatitis C. Sexually transmitted infections (STIs) You should be screened each year for STIs, including gonorrhea and chlamydia, if: You are sexually active and are younger than 57 years of age. You are older than 57 years   of age and your health care provider tells you that you are at risk for this type of infection. Your sexual activity has changed since you were last screened, and you are at increased risk for chlamydia or gonorrhea. Ask your health care provider if you are at risk. Ask your health care provider about whether you are at high risk for HIV.  Your health care provider may recommend a prescription medicine to help prevent HIV infection. If you choose to take medicine to prevent HIV, you should first get tested for HIV. You should then be tested every 3 months for as long as you are taking the medicine. Follow these instructions at home: Lifestyle Do not use any products that contain nicotine or tobacco, such as cigarettes, e-cigarettes, and chewing tobacco. If you need help quitting, ask your health care provider. Do not use street drugs. Do not share needles. Ask your health care provider for help if you need support or information about quitting drugs. Alcohol use Do not drink alcohol if your health care provider tells you not to drink. If you drink alcohol: Limit how much you have to 0-2 drinks a day. Be aware of how much alcohol is in your drink. In the [place] one drink equals one 12 oz bottle of beer (355 mL), one 5 oz glass of wine (148 mL), or one 1 oz glass of hard liquor (44 mL). General instructions Schedule regular health, dental, and eye exams. Stay current with your vaccines. Tell your health care provider if: You often feel depressed. You have ever been abused or do not feel safe at home. Summary Adopting a healthy lifestyle and getting preventive care are important in promoting health and wellness. Follow your health care provider's instructions about healthy diet, exercising, and getting tested or screened for diseases. Follow your health care provider's instructions on monitoring your cholesterol and blood pressure. This information is not intended to replace advice given to you by your health care provider. Make sure you discuss any questions you have with your health care provider. Document Revised: 01/12/2021 Document Reviewed: 10/28/2018 Elsevier Patient Education  2022 [employer]  

## 2021-08-07 ENCOUNTER — Other Ambulatory Visit: Payer: Self-pay | Admitting: Internal Medicine

## 2021-08-07 ENCOUNTER — Ambulatory Visit: Payer: BC Managed Care – PPO | Admitting: Endocrinology

## 2021-08-07 DIAGNOSIS — E559 Vitamin D deficiency, unspecified: Secondary | ICD-10-CM

## 2021-08-07 MED ORDER — VITAMIN D (ERGOCALCIFEROL) 1.25 MG (50000 UNIT) PO CAPS: 50000.0000 [IU] | ORAL_CAPSULE | ORAL | 0 refills | Status: DC

## 2021-08-07 MED ORDER — ROSUVASTATIN CALCIUM 10 MG PO TABS: 10.0000 mg | ORAL_TABLET | Freq: Every day | ORAL | 1 refills | Status: DC

## 2021-08-09 ENCOUNTER — Other Ambulatory Visit: Payer: Self-pay | Admitting: Endocrinology

## 2021-08-13 ENCOUNTER — Ambulatory Visit: Payer: BC Managed Care – PPO | Admitting: Internal Medicine

## 2021-08-20 ENCOUNTER — Encounter: Payer: Self-pay | Admitting: Internal Medicine

## 2021-08-20 ENCOUNTER — Ambulatory Visit: Payer: BC Managed Care – PPO | Admitting: Internal Medicine

## 2021-08-28 ENCOUNTER — Ambulatory Visit: Payer: BC Managed Care – PPO | Admitting: Internal Medicine

## 2021-08-28 ENCOUNTER — Telehealth: Payer: Self-pay | Admitting: Internal Medicine

## 2021-08-28 NOTE — Telephone Encounter (Signed)
Calling regarding PA for Ozempic   Reference Key: [id number]  pLEASE CALL [phone]

## 2021-08-31 NOTE — Telephone Encounter (Signed)
Approved Effective from 08/31/2021 through 08/30/2022.

## 2021-08-31 NOTE — Telephone Encounter (Signed)
New PA initiated for Ozempic. Previous key did not have a form attached to it.   New key below:   Key: [id number]

## 2021-09-19 ENCOUNTER — Other Ambulatory Visit: Payer: Self-pay | Admitting: Internal Medicine

## 2021-09-19 DIAGNOSIS — I739 Peripheral vascular disease, unspecified: Secondary | ICD-10-CM

## 2021-09-21 ENCOUNTER — Other Ambulatory Visit: Payer: Self-pay | Admitting: Endocrinology

## 2021-09-22 ENCOUNTER — Encounter: Payer: Self-pay | Admitting: Internal Medicine

## 2021-09-24 ENCOUNTER — Other Ambulatory Visit: Payer: Self-pay | Admitting: Internal Medicine

## 2021-09-24 DIAGNOSIS — E118 Type 2 diabetes mellitus with unspecified complications: Secondary | ICD-10-CM

## 2021-09-24 MED ORDER — RELION PEN NEEDLES 31G X 6 MM MISC: 1.0000 | 0 refills | Status: DC

## 2021-09-24 MED ORDER — SEMAGLUTIDE (1 MG/DOSE) 4 MG/3ML ~~LOC~~ SOPN: 1.0000 mg | PEN_INJECTOR | SUBCUTANEOUS | 0 refills | Status: DC

## 2021-09-25 ENCOUNTER — Encounter: Payer: Self-pay | Admitting: Family Medicine

## 2021-09-25 ENCOUNTER — Telehealth (INDEPENDENT_AMBULATORY_CARE_PROVIDER_SITE_OTHER): Payer: BC Managed Care – PPO | Admitting: Family Medicine

## 2021-09-25 VITALS — Temp 101.0°F | Ht 72.0 in

## 2021-09-25 DIAGNOSIS — U071 COVID-19: Secondary | ICD-10-CM | POA: Diagnosis not present

## 2021-09-25 MED ORDER — PROMETHAZINE-DM 6.25-15 MG/5ML PO SYRP: 5.0000 mL | ORAL_SOLUTION | Freq: Every evening | ORAL | 0 refills | Status: DC | PRN

## 2021-09-25 MED ORDER — MOLNUPIRAVIR EUA 200MG CAPSULE: 4.0000 | ORAL_CAPSULE | Freq: Two times a day (BID) | ORAL | 0 refills | Status: AC

## 2021-09-25 NOTE — Progress Notes (Signed)
VIRTUAL VISIT Due to national recommendations of social distancing due to [place] 19, a virtual visit is felt to be most appropriate for this patient at this time.   I connected with the patient on 09/25/21 at  9:00 AM EST by virtual telehealth platform and verified that I am speaking with the correct person using two identifiers.   I discussed the limitations, risks, security and privacy concerns of performing an evaluation and management service by  virtual telehealth platform and the availability of in person appointments. I also discussed with the patient that there may be a patient responsible charge related to this service. The patient expressed understanding and agreed to proceed.  Patient location: Home Provider Location: [facility] [place] Participants: Eliezer Lofts and Brock Ra   Chief Complaint  Patient presents with   Covid Positive    Positive Home test x 2 yesterday and this morning-Symptoms started yesterday   Headache   Sore Throat   Nasal Congestion   Cough   Fever   Fatigue   Nausea   Diarrhea         History of Present Illness:  55 year old male  patient of Dr. Ronnald Ramp with history of HTN, DM, OSA, PVD presents with COVID infection.  Date of onset: 11/7  Has some headache, sore throat, congestion.  Now with mild cough, dry. No SOB, no wheeze. Feeling tired.  Having nausea and diarrhea. Able to keep up with fluids.   Fever 100.9 F... using tylenol to treat.  No chronic respiratory issues   FBS not checking... but will. Usually well controlled.   Using [employer].  COVID 19 screen COVID testing:home test x 2 positive 09/24/2021 COVID vaccine: 07/30/2021 , 03/09/2021 , 08/02/2020 , 02/16/2020 , 01/26/2020 COVID exposure: No recent travel or known exposure to [employer]  The importance of social distancing was discussed today.    Review of Systems  Constitutional:  Negative for chills and fever.  HENT:  Negative for congestion and ear pain.   Eyes:  Negative for  pain and redness.  Respiratory:  Negative for cough and shortness of breath.   Cardiovascular:  Negative for chest pain, palpitations and leg swelling.  Gastrointestinal:  Negative for abdominal pain, blood in stool, constipation, diarrhea, nausea and vomiting.  Genitourinary:  Negative for dysuria.  Musculoskeletal:  Negative for falls and myalgias.  Skin:  Negative for rash.  Neurological:  Negative for dizziness.  Psychiatric/Behavioral:  Negative for depression. The patient is not nervous/anxious.      Past Medical History:  Diagnosis Date   Hypertension    Obesity    OSA on CPAP    Type II diabetes mellitus with manifestations ([place])     reports that he has never smoked. He has quit using smokeless tobacco.  His smokeless tobacco use included chew. He reports that he does not drink alcohol and does not use drugs.   Current Outpatient Medications:    ALPRAZolam (XANAX) 0.5 MG tablet, TAKE 1 TABLET(0.5 MG) BY MOUTH THREE TIMES DAILY AS NEEDED FOR ANXIETY, Disp: 270 tablet, Rfl: 0   Blood Glucose Monitoring Suppl ([place]) w/Device KIT, Use as instructed to check blood sugar once daily., Disp: 1 kit, Rfl: 0   cilostazol (PLETAL) 100 MG tablet, TAKE 1 TABLET(100 MG) BY MOUTH TWICE DAILY, Disp: 180 tablet, Rfl: 0   clomiPHENE (CLOMID) 50 MG tablet, TAKE 1/2 TABLET BY MOUTH 3 TIMES A WEEK, Disp: 20 tablet, Rfl: 1   Glucosamine HCl-MSM (GLUCOSAMINE-MSM  PO), Take 1 capsule by mouth daily., Disp: , Rfl:    glucose blood (ONETOUCH VERIO) test strip, 1 each by Other route as needed for other. Use as instructed, Disp: 100 each, Rfl: 1   glucose blood test strip, Use onetouch verio flex test strip as instructed to check blood sugar once daily., Disp: 100 each, Rfl: 3   Insulin Pen Needle (RELION PEN NEEDLES) 31G X 6 MM MISC, Inject 1 Act into the skin once a week. Use as needed for insulin injection., Disp: 30 each, Rfl: 0   Lancets (ONETOUCH DELICA PLUS [id number]) MISC, Use to  check blood sugar once a day, Disp: 100 each, Rfl: 1   Lancets (ONETOUCH ULTRASOFT) lancets, Use onetouch delica lancet to check blood sugar once daily., Disp: 100 each, Rfl: 3   levothyroxine (SYNTHROID) 75 MCG tablet, Take 1 tablet (75 mcg total) by mouth daily before breakfast., Disp: 90 tablet, Rfl: 1   primidone (MYSOLINE) 50 MG tablet, TAKE 1 TABLET(50 MG) BY MOUTH AT BEDTIME, Disp: 90 tablet, Rfl: 0   propranolol (INDERAL) 40 MG tablet, TAKE 1 TABLET(40 MG) BY MOUTH THREE TIMES DAILY, Disp: 270 tablet, Rfl: 1   rosuvastatin (CRESTOR) 10 MG tablet, Take 1 tablet (10 mg total) by mouth daily., Disp: 90 tablet, Rfl: 1   Semaglutide, 1 MG/DOSE, 4 MG/3ML SOPN, Inject 1 mg as directed once a week., Disp: 9 mL, Rfl: 0   Testosterone 1.62 % GEL, APPLY 1 PUMP ON EACH UPPER ARM EVERY DAY, Disp: 75 g, Rfl: 5   triamterene-hydrochlorothiazide (MAXZIDE-25) 37.5-25 MG tablet, TAKE 1 TABLET BY MOUTH DAILY, Disp: 90 tablet, Rfl: 0   Vitamin D, Ergocalciferol, (DRISDOL) 1.25 MG (50000 UNIT) CAPS capsule, Take 1 capsule (50,000 Units total) by mouth every 7 (seven) days., Disp: 12 capsule, Rfl: 0   Observations/Objective: Temperature (!) 101 F (38.3 C), temperature source Oral, height 6' (1.829 m). Physical Exam Constitutional:      General: The patient is not in acute distress. Pulmonary:     Effort: Pulmonary effort is normal. No respiratory distress.  Neurological:     Mental Status: The patient is alert and oriented to person, place, and time.  Psychiatric:        Mood and Affect: Mood normal.        Behavior: Behavior normal.    Assessment and Plan Problem List Items Addressed This Visit     COVID-19 - Primary    COVID19  Infection < 5 days from onset of symptoms in 5-6 X vaccinated overweight individual with history of  HTN, DM, OSA  No clear sign of bacterial infection at this time.   No SOB.  No red flags/need for ER visit or in-person exam at respiratory clinic at this time..    Pt  higher risk for COVID complications given  Obesity, HTN, DM Paxlovid medication contraindications.  Start molnupiravir 5 day course. Reviewed course of medication and side effect profile with patient in detail.   Symptomatic care with mucinex and cough suppressant at night. If SOB begins symptoms worsening.. have low threshold for in-person exam, if severe shortness of breath ER visit recommended.  Can monitor Oxygen saturation at home with home monitor if able to obtain.  Go to ER if O2 sat < 90% on room air.   Reviewed home care and provided information through [facility].  Recommended quarantine 5 days isolation recommended. Return to work day 6 and wear mask for 4 more days to complete 10 days. Provided info  about prevention of spread of COVID 19.       Relevant Medications   molnupiravir EUA (LAGEVRIO) 200 mg CAPS capsule      I discussed the assessment and treatment plan with the patient. The patient was provided an opportunity to ask questions and all were answered. The patient agreed with the plan and demonstrated an understanding of the instructions.   The patient was advised to call back or seek an in-person evaluation if the symptoms worsen or if the condition fails to improve as anticipated.     Eliezer Lofts, MD

## 2021-09-25 NOTE — Assessment & Plan Note (Signed)
COVID19  Infection < 5 days from onset of symptoms in 5-6 X vaccinated overweight individual with history of  HTN, DM, OSA  No clear sign of bacterial infection at this time.   No SOB.  No red flags/need for ER visit or in-person exam at respiratory clinic at this time..    Pt higher risk for COVID complications given  Obesity, HTN, DM Paxlovid medication contraindications.  Start molnupiravir 5 day course. Reviewed course of medication and side effect profile with patient in detail.   Symptomatic care with mucinex and cough suppressant at night. If SOB begins symptoms worsening.. have low threshold for in-person exam, if severe shortness of breath ER visit recommended.  Can monitor Oxygen saturation at home with home monitor if able to obtain.  Go to ER if O2 sat < 90% on room air.   Reviewed home care and provided information through [facility].  Recommended quarantine 5 days isolation recommended. Return to work day 6 and wear mask for 4 more days to complete 10 days. Provided info about prevention of spread of COVID 19.

## 2021-09-28 ENCOUNTER — Other Ambulatory Visit: Payer: BC Managed Care – PPO

## 2021-10-02 ENCOUNTER — Other Ambulatory Visit: Payer: Self-pay | Admitting: Internal Medicine

## 2021-10-02 ENCOUNTER — Other Ambulatory Visit: Payer: Self-pay | Admitting: Endocrinology

## 2021-10-02 DIAGNOSIS — I10 Essential (primary) hypertension: Secondary | ICD-10-CM

## 2021-10-02 DIAGNOSIS — G250 Essential tremor: Secondary | ICD-10-CM

## 2021-10-02 DIAGNOSIS — F411 Generalized anxiety disorder: Secondary | ICD-10-CM

## 2021-10-02 MED ORDER — ALPRAZOLAM 0.5 MG PO TABS: 0.5000 mg | ORAL_TABLET | Freq: Three times a day (TID) | ORAL | 0 refills | Status: DC | PRN

## 2021-10-02 MED ORDER — TRIAMTERENE-HCTZ 37.5-25 MG PO TABS: 1.0000 | ORAL_TABLET | Freq: Every day | ORAL | 0 refills | Status: DC

## 2021-10-03 ENCOUNTER — Ambulatory Visit: Payer: BC Managed Care – PPO | Admitting: Endocrinology

## 2021-10-13 ENCOUNTER — Other Ambulatory Visit: Payer: Self-pay | Admitting: Internal Medicine

## 2021-10-13 DIAGNOSIS — E039 Hypothyroidism, unspecified: Secondary | ICD-10-CM

## 2021-10-29 DIAGNOSIS — L570 Actinic keratosis: Secondary | ICD-10-CM | POA: Diagnosis not present

## 2021-10-29 DIAGNOSIS — L814 Other melanin hyperpigmentation: Secondary | ICD-10-CM | POA: Diagnosis not present

## 2021-10-29 DIAGNOSIS — D485 Neoplasm of uncertain behavior of skin: Secondary | ICD-10-CM | POA: Diagnosis not present

## 2021-10-29 DIAGNOSIS — L853 Xerosis cutis: Secondary | ICD-10-CM | POA: Diagnosis not present

## 2021-10-29 DIAGNOSIS — D225 Melanocytic nevi of trunk: Secondary | ICD-10-CM | POA: Diagnosis not present

## 2021-10-29 DIAGNOSIS — L821 Other seborrheic keratosis: Secondary | ICD-10-CM | POA: Diagnosis not present

## 2021-10-31 ENCOUNTER — Other Ambulatory Visit: Payer: Self-pay | Admitting: Internal Medicine

## 2021-10-31 ENCOUNTER — Other Ambulatory Visit: Payer: Self-pay | Admitting: Endocrinology

## 2021-10-31 DIAGNOSIS — E559 Vitamin D deficiency, unspecified: Secondary | ICD-10-CM

## 2021-11-02 ENCOUNTER — Encounter: Payer: Self-pay | Admitting: Internal Medicine

## 2021-11-02 ENCOUNTER — Other Ambulatory Visit: Payer: Self-pay

## 2021-11-02 ENCOUNTER — Encounter: Payer: Self-pay | Admitting: Endocrinology

## 2021-11-03 ENCOUNTER — Other Ambulatory Visit: Payer: Self-pay | Admitting: Internal Medicine

## 2021-11-04 ENCOUNTER — Other Ambulatory Visit: Payer: Self-pay | Admitting: Endocrinology

## 2021-11-05 ENCOUNTER — Other Ambulatory Visit: Payer: Self-pay | Admitting: Internal Medicine

## 2021-11-05 DIAGNOSIS — G250 Essential tremor: Secondary | ICD-10-CM

## 2021-11-05 MED ORDER — PRIMIDONE 50 MG PO TABS: 50.0000 mg | ORAL_TABLET | Freq: Every day | ORAL | 0 refills | Status: DC

## 2021-11-14 ENCOUNTER — Telehealth: Payer: Self-pay | Admitting: Gastroenterology

## 2021-11-14 NOTE — Telephone Encounter (Signed)
Patient called to make an appointment for painful bloating, gas, diarrhea and not being able to eat. Patient was scheduled with Colleen on 1/13 but is seeking advice to see if theres anything he can do in the mean tie. Please advise.

## 2021-11-14 NOTE — Telephone Encounter (Signed)
Spoke to this very pleasant patient. He was last seen in clinic 05/11/2019. He had similar symptoms. Calls today with complaints of several days of abdominal bloating, mucous stools (3 to 4 times today) and gas. He reports belching and flatus. Abdominal pain is upper abdomen and center. He has tried ranitidine and Gas-X with no noticeable relief or change in his symptoms. He is scheduled in the first available 11/30/21. Is there anything he could try in the interim?

## 2021-11-14 NOTE — Telephone Encounter (Signed)
Please advise trial of lactose free diet and obtain GI pathogen panel if he has any diarrhea. Please send Rx for Omeprazole 40 mg daily to take 30 minutes before breakfast daily X 30 days and follow up in office as scheduled. Thanks

## 2021-11-15 ENCOUNTER — Other Ambulatory Visit: Payer: BC Managed Care – PPO

## 2021-11-15 ENCOUNTER — Other Ambulatory Visit: Payer: Self-pay

## 2021-11-15 ENCOUNTER — Telehealth: Payer: Self-pay | Admitting: *Deleted

## 2021-11-15 DIAGNOSIS — R197 Diarrhea, unspecified: Secondary | ICD-10-CM

## 2021-11-15 MED ORDER — OMEPRAZOLE 40 MG PO CPDR: 40.0000 mg | DELAYED_RELEASE_CAPSULE | Freq: Every day | ORAL | 0 refills | Status: DC

## 2021-11-15 NOTE — Telephone Encounter (Signed)
Omeprazole prior authorization done through Cover My Meds

## 2021-11-15 NOTE — Telephone Encounter (Signed)
Message sent to the patient. Rx to the [facility].

## 2021-11-16 ENCOUNTER — Other Ambulatory Visit: Payer: BC Managed Care – PPO

## 2021-11-16 DIAGNOSIS — R197 Diarrhea, unspecified: Secondary | ICD-10-CM | POA: Diagnosis not present

## 2021-11-16 MED ORDER — PANTOPRAZOLE SODIUM 40 MG PO TBEC: 40.0000 mg | DELAYED_RELEASE_TABLET | Freq: Every day | ORAL | 0 refills | Status: DC

## 2021-11-16 NOTE — Telephone Encounter (Signed)
Omeprazole 40mg  has been denied because there is an over the counter equivalent. I will forward to Dr Silverio Decamp and ask if it is okay to take omeprazole 20mg  , Sig: 2 qd or would she like to switch to another rx for [facility].

## 2021-11-16 NOTE — Telephone Encounter (Signed)
Ok to use OTC omeprazole 20mg  X 2  capsules daily or please switch to alternate PPI (Pantoprazole 40mg  daily or Lansoprazole 30mg  daily) covered by his plan. Thanks

## 2021-11-16 NOTE — Telephone Encounter (Signed)
I spoke with Yvone Neu and he preferred I send in the pantoprazole 40mg  for a 90 day supply. I confirmed the pharmacy and sent the rx in.

## 2021-11-19 LAB — GI PROFILE, STOOL, PCR

## 2021-11-20 ENCOUNTER — Other Ambulatory Visit: Payer: BC Managed Care – PPO

## 2021-11-21 ENCOUNTER — Other Ambulatory Visit: Payer: Self-pay | Admitting: Family Medicine

## 2021-11-21 ENCOUNTER — Encounter: Payer: Self-pay | Admitting: Family Medicine

## 2021-11-21 ENCOUNTER — Ambulatory Visit (INDEPENDENT_AMBULATORY_CARE_PROVIDER_SITE_OTHER): Payer: BC Managed Care – PPO | Admitting: Family Medicine

## 2021-11-21 ENCOUNTER — Ambulatory Visit (INDEPENDENT_AMBULATORY_CARE_PROVIDER_SITE_OTHER): Payer: BC Managed Care – PPO

## 2021-11-21 ENCOUNTER — Other Ambulatory Visit (INDEPENDENT_AMBULATORY_CARE_PROVIDER_SITE_OTHER): Payer: BC Managed Care – PPO

## 2021-11-21 ENCOUNTER — Ambulatory Visit: Payer: Self-pay

## 2021-11-21 ENCOUNTER — Other Ambulatory Visit: Payer: Self-pay

## 2021-11-21 VITALS — BP 128/72 | HR 105 | Ht 72.0 in | Wt 351.0 lb

## 2021-11-21 DIAGNOSIS — M25562 Pain in left knee: Secondary | ICD-10-CM

## 2021-11-21 DIAGNOSIS — G8929 Other chronic pain: Secondary | ICD-10-CM

## 2021-11-21 DIAGNOSIS — E039 Hypothyroidism, unspecified: Secondary | ICD-10-CM

## 2021-11-21 DIAGNOSIS — M1712 Unilateral primary osteoarthritis, left knee: Secondary | ICD-10-CM

## 2021-11-21 DIAGNOSIS — E669 Obesity, unspecified: Secondary | ICD-10-CM

## 2021-11-21 DIAGNOSIS — E1169 Type 2 diabetes mellitus with other specified complication: Secondary | ICD-10-CM | POA: Diagnosis not present

## 2021-11-21 DIAGNOSIS — E230 Hypopituitarism: Secondary | ICD-10-CM

## 2021-11-21 HISTORY — DX: Unilateral primary osteoarthritis, left knee: M17.12

## 2021-11-21 LAB — TESTOSTERONE: Testosterone: 403.16 ng/dL (ref 300.00–890.00)

## 2021-11-21 LAB — COMPREHENSIVE METABOLIC PANEL
ALT: 81 U/L — ABNORMAL HIGH (ref 0–53)
AST: 48 U/L — ABNORMAL HIGH (ref 0–37)
Albumin: 4.1 g/dL (ref 3.5–5.2)
Alkaline Phosphatase: 45 U/L (ref 39–117)
BUN: 16 mg/dL (ref 6–23)
CO2: 34 mEq/L — ABNORMAL HIGH (ref 19–32)
Calcium: 9 mg/dL (ref 8.4–10.5)
Chloride: 99 mEq/L (ref 96–112)
Creatinine, Ser: 0.93 mg/dL (ref 0.40–1.50)
GFR: 90.85 mL/min (ref >60.00)
Glucose, Bld: 115 mg/dL — ABNORMAL HIGH (ref 70–99)
Potassium: 3.4 mEq/L — ABNORMAL LOW (ref 3.5–5.1)
Sodium: 142 mEq/L (ref 135–145)
Total Bilirubin: 1.2 mg/dL (ref 0.2–1.2)
Total Protein: 6.7 g/dL (ref 6.0–8.3)

## 2021-11-21 LAB — T4, FREE: Free T4: 1.29 ng/dL (ref 0.60–1.60)

## 2021-11-21 IMAGING — DX DG KNEE AP/LAT W/ SUNRISE*L*
3 series · 3 of 3 positions shown · non-contrast
Comparison: None.

CLINICAL DATA: Left knee pain

EXAM:
LEFT KNEE 3 VIEWS

[knee ap]
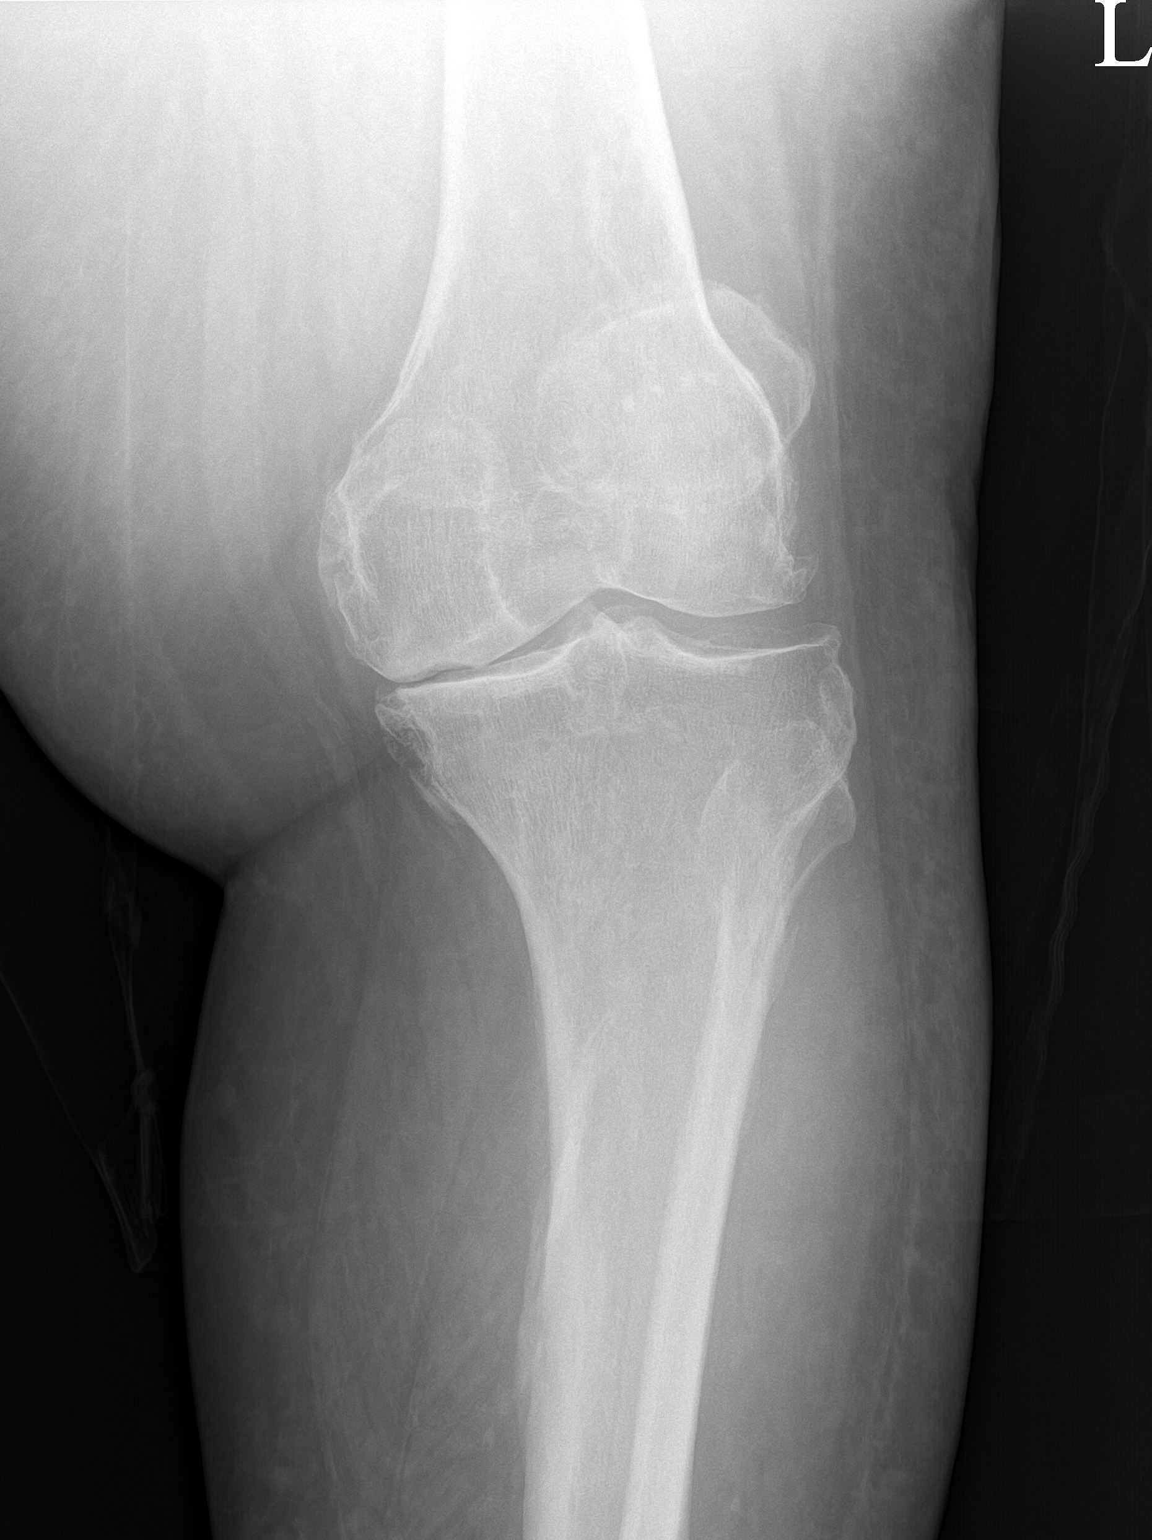

[knee lat]
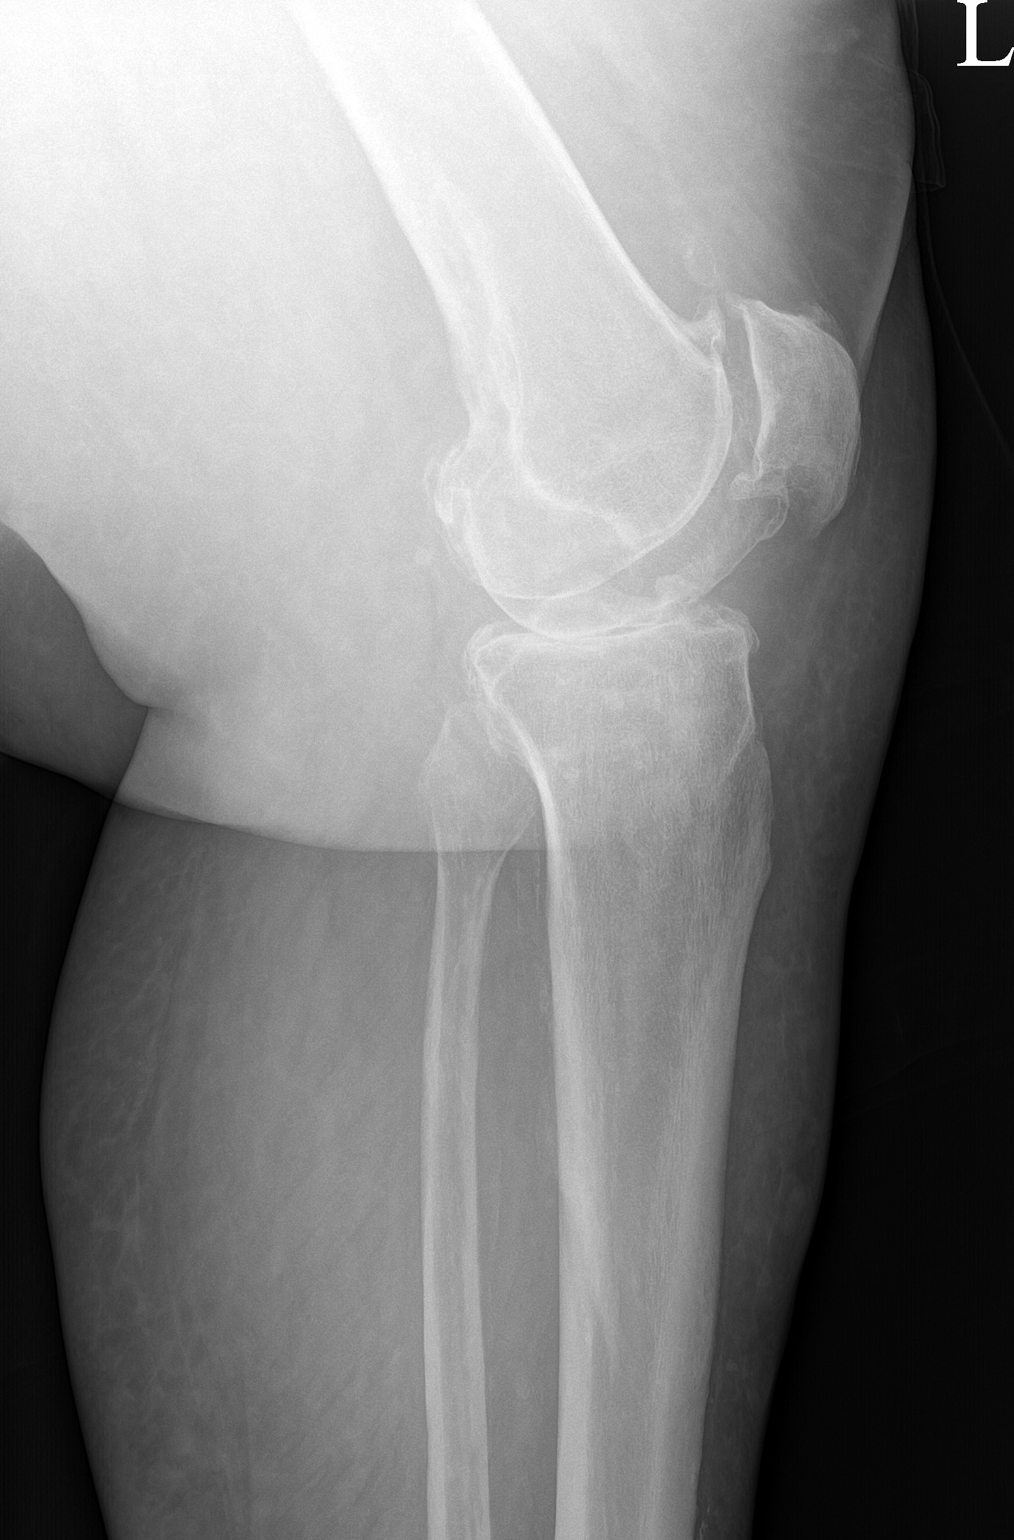

[patella]
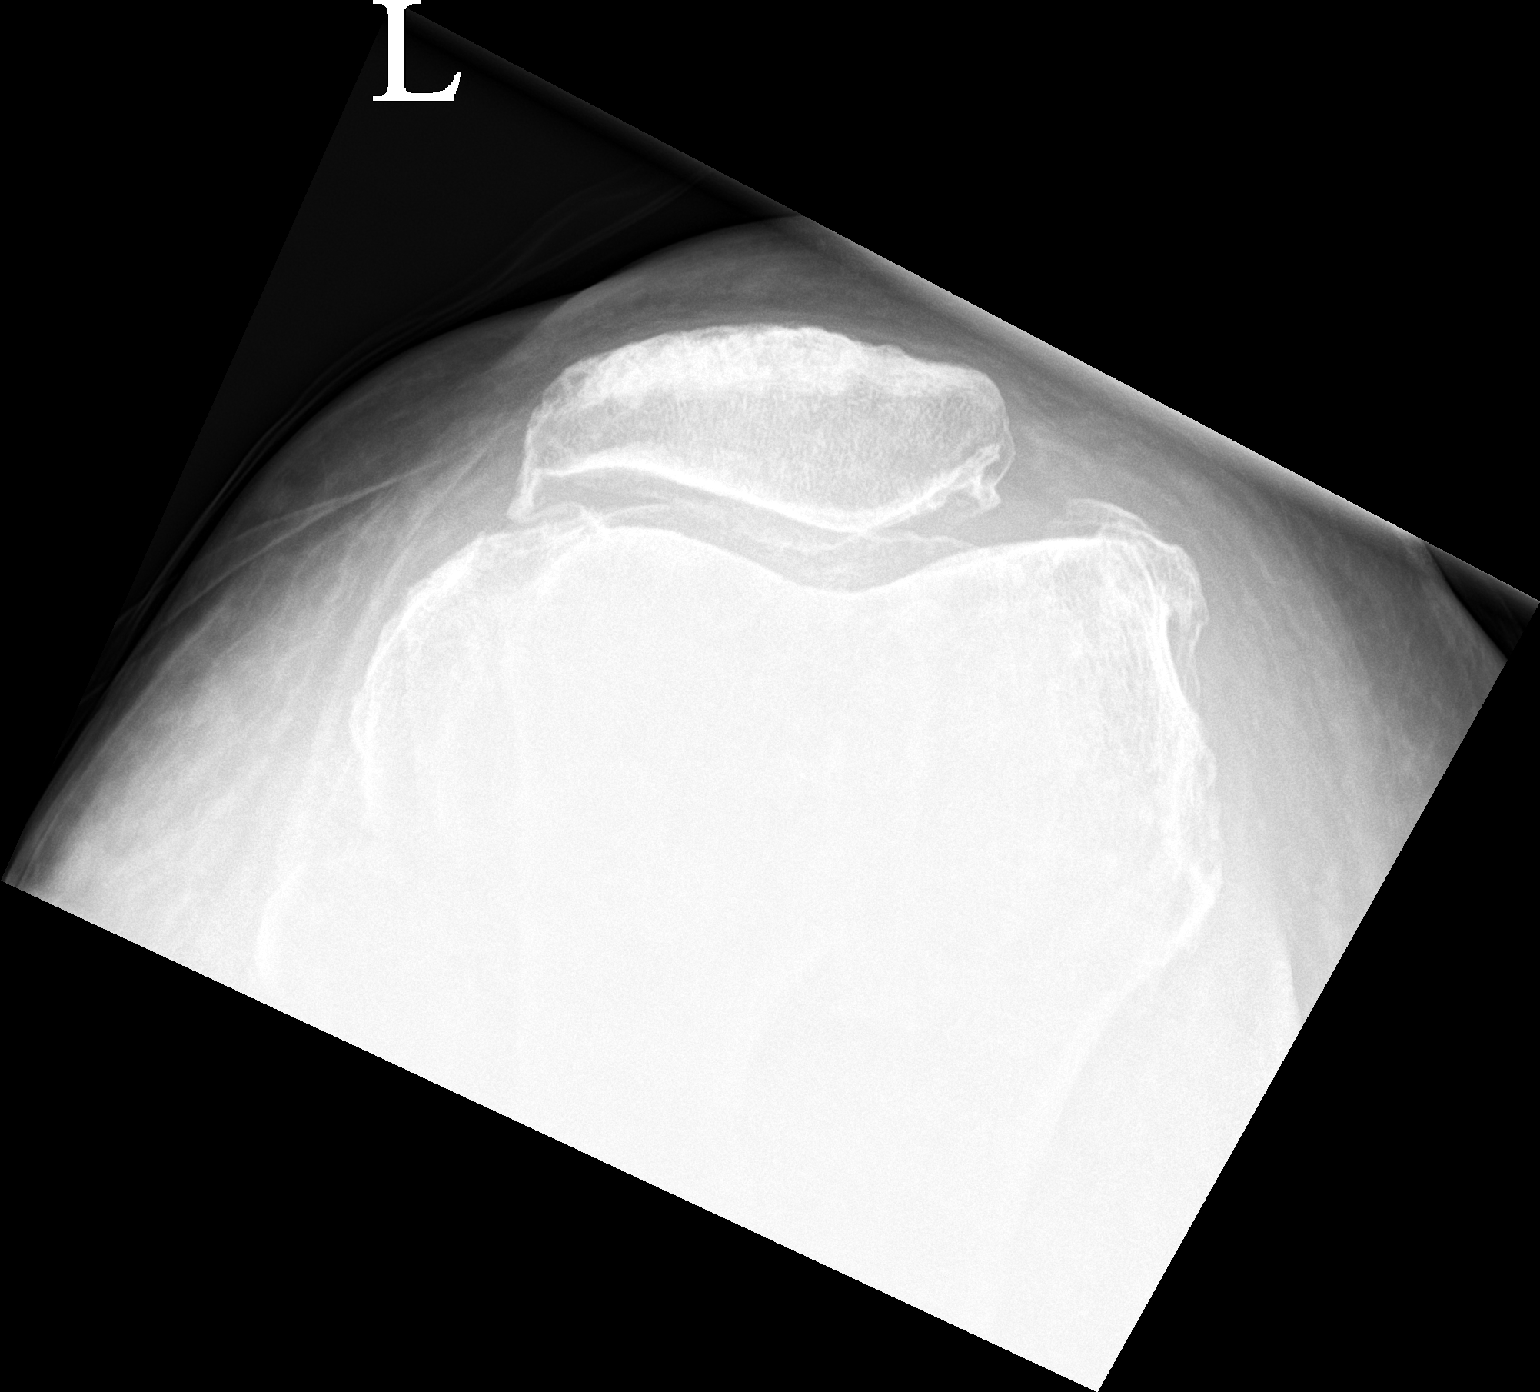

[3 of 3 positions shown; findings below may reference images not displayed]

FINDINGS: Frontal, lateral, and sunrise views of the left knee are obtained.
There is moderate 3 compartmental osteoarthritis greatest in the
medial compartment. No fracture, subluxation, or dislocation. No
joint effusion. Diffuse subcutaneous edema.
IMPRESSION: 1. Moderate 3 compartmental osteoarthritis, greatest medially.

## 2021-11-21 NOTE — Patient Instructions (Addendum)
Xray today Tart cherry etract 1200mg  at night Oral hyluranic acid Keep working on strength Ice 20 min a day See me in 5 weeks

## 2021-11-21 NOTE — Progress Notes (Signed)
[place] [facility] [place] Phone: [phone] Subjective:   [name], am serving as a scribe for Dr. Hulan Saas. This visit occurred during the SARS-CoV-2 public health emergency.  Safety protocols were in place, including screening questions prior to the visit, additional usage of staff PPE, and extensive cleaning of exam room while observing appropriate contact time as indicated for disinfecting solutions.  I'm seeing this patient by the request  of:  Zachary Lima, MD  CC: Left knee pain  [id number]  Zachary Murray is a 56 y.o. male coming in with complaint of L knee pain. Last seen in 2020 for ankle pain. Patient states that he took glucosamine chondrotic but had to stop taking it and noticed an increase in his pain over time recently. Pain over medial, anterior aspect.       Past Medical History:  Diagnosis Date   Hypertension    Obesity    OSA on CPAP    Type II diabetes mellitus with manifestations Zach[facility])    Past Surgical History:  Procedure Laterality Date   BIOPSY  08/31/2018   Procedure: BIOPSY;  Surgeon: Zachary Pole, MD;  Location: [facility];  Service: Endoscopy;;   COLONOSCOPY WITH PROPOFOL N/A 11/12/2016   Procedure: COLONOSCOPY WITH PROPOFOL;  Surgeon: Zachary Pole, MD;  Location: [facility];  Service: Endoscopy;  Laterality: N/A;   COLONOSCOPY WITH PROPOFOL N/A 08/31/2018   Procedure: COLONOSCOPY WITH PROPOFOL;  Surgeon: Zachary Pole, MD;  Location: [facility];  Service: Endoscopy;  Laterality: N/A;   POLYPECTOMY  08/31/2018   Procedure: POLYPECTOMY;  Surgeon: Zachary Pole, MD;  Location: [facility];  Service: Endoscopy;;   WISDOM TOOTH EXTRACTION     Social History   Socioeconomic History   Marital status: Single    Spouse name: Not on file   Number of children: 0   Years of education: Not on file   Highest education level: Some college, no degree   Occupational History   Occupation: unemployed  Tobacco Use   Smoking status: Never   Smokeless tobacco: Former    Types: Nurse, children's Use: Never used  Substance and Sexual Activity   Alcohol use: No    Alcohol/week: 0.0 standard drinks    Comment: hx of alcohol abuse - quit 2017   Drug use: No   Sexual activity: Not Currently  Other Topics Concern   Not on file  Social History Narrative   Not on file   Social Determinants of Health   Financial Resource Strain: Not on file  Food Insecurity: Not on file  Transportation Needs: Not on file  Physical Activity: Not on file  Stress: Not on file  Social Connections: Not on file   Allergies  Allergen Reactions   Metformin And Related Diarrhea   Wellbutrin [Bupropion] Other (See Comments)    tremors   Lisinopril Cough   Family History  Problem Relation Age of Onset   Colon cancer Father    Hypertension Father    Thyroid cancer Mother    Colon polyps Mother    Thyroid disease Mother    Thyroid disease Sister    Thyroid disease Maternal Grandmother    Alcohol abuse Neg Hx    COPD Neg Hx    Diabetes Neg Hx    Early death Neg Hx    Hearing loss Neg Hx    Heart disease Neg Hx  Hyperlipidemia Neg Hx    Kidney disease Neg Hx    Stroke Neg Hx    Esophageal cancer Neg Hx     Current Outpatient Medications (Endocrine & Metabolic):    clomiPHENE (CLOMID) 50 MG tablet, TAKE 1/2 TABLET BY MOUTH 3 TIMES A WEEK   levothyroxine (SYNTHROID) 75 MCG tablet, TAKE 1 TABLET(75 MCG) BY MOUTH DAILY BEFORE AND BREAKFAST   Semaglutide, 1 MG/DOSE, 4 MG/3ML SOPN, Inject 1 mg as directed once a week.   Testosterone 1.62 % GEL, APPLY 1 PUMP ON EACH UPPER ARM EVERY DAY  Current Outpatient Medications (Cardiovascular):    propranolol (INDERAL) 40 MG tablet, TAKE 1 TABLET(40 MG) BY MOUTH THREE TIMES DAILY   rosuvastatin (CRESTOR) 10 MG tablet, Take 1 tablet (10 mg total) by mouth daily.   triamterene-hydrochlorothiazide  (MAXZIDE-25) 37.5-25 MG tablet, Take 1 tablet by mouth daily.    Current Outpatient Medications (Hematological):    cilostazol (PLETAL) 100 MG tablet, TAKE 1 TABLET(100 MG) BY MOUTH TWICE DAILY  Current Outpatient Medications (Other):    ALPRAZolam (XANAX) 0.5 MG tablet, Take 1 tablet (0.5 mg total) by mouth 3 (three) times daily as needed for anxiety.   Blood Glucose Monitoring Suppl ([place]) w/Device KIT, Use as instructed to check blood sugar once daily.   Glucosamine HCl-MSM (GLUCOSAMINE-MSM PO), Take 1 capsule by mouth daily.   glucose blood (ONETOUCH VERIO) test strip, 1 each by Other route as needed for other. Use as instructed   glucose blood test strip, Use onetouch verio flex test strip as instructed to check blood sugar once daily.   Insulin Pen Needle (RELION PEN NEEDLES) 31G X 6 MM MISC, Inject 1 Act into the skin once a week. Use as needed for insulin injection.   Lancets (ONETOUCH DELICA PLUS [id number]) MISC, Use to check blood sugar once a day   Lancets (ONETOUCH ULTRASOFT) lancets, Use onetouch delica lancet to check blood sugar once daily.   pantoprazole (PROTONIX) 40 MG tablet, Take 1 tablet (40 mg total) by mouth daily.   primidone (MYSOLINE) 50 MG tablet, Take 1 tablet (50 mg total) by mouth at bedtime.   Vitamin D, Ergocalciferol, (DRISDOL) 1.25 MG (50000 UNIT) CAPS capsule, TAKE 1 CAPSULE BY MOUTH EVERY 7 DAYS   Reviewed prior external information including notes and imaging from  primary care provider As well as notes that were available from care everywhere and other healthcare systems.  Past medical history, social, surgical and family history all reviewed in electronic medical record.  No pertanent information unless stated regarding to the chief complaint.   Review of Systems:  No headache, visual changes, nausea, vomiting, diarrhea, constipation, dizziness, abdominal pain, skin rash, fevers, chills, night sweats, weight loss, swollen lymph  nodes, body aches, chest pain, shortness of breath, mood changes. POSITIVE muscle aches, joint swelling  Objective  Blood pressure 128/72, pulse (!) 105, height 6' (1.829 m), weight (!) 351 lb (159.2 kg), SpO2 94 %.   General: No apparent distress alert and oriented x3 mood and affect normal, dressed appropriately.  Overweight HEENT: Pupils equal, extraocular movements intact  Respiratory: Patient's speak in full sentences and does not appear short of breath  Cardiovascular: Trace lower extremity edema, non tender, no erythema  Gait antalgic MSK: Left knee does have some arthritic changes however.  He does have abnormal thigh to calf ratio noted.  Instability noted with valgus and varus force.  Patient lacks last 5 degrees of flexion.   Limited muscular skeletal ultrasound  was performed and interpreted by Hulan Saas, M  Limited ultrasound of patient's right knee shows the patient does have moderate to severe narrowing of the medial joint space.  Patient also has some narrowing that is moderate to severe at the patellofemoral joint.  Trace effusion noted. Knee arthritis   Impression and Recommendations:     The above documentation has been reviewed and is accurate and complete Lyndal Pulley, DO

## 2021-11-21 NOTE — Assessment & Plan Note (Signed)
Patient is an arthritic changes of the knee.  Discussed with patient about icing regimen and home exercises.  Patient would still like to try conservative therapy and instead injection.  We will get x-rays to further evaluate the severity of the arthritic changes.  Depending on findings patient could be a candidate for multiple different types of treatments.  Follow-up with me again in 4 to 6 weeks to see how patient is responding.

## 2021-11-22 ENCOUNTER — Ambulatory Visit: Payer: BC Managed Care – PPO | Admitting: Endocrinology

## 2021-11-23 ENCOUNTER — Ambulatory Visit: Payer: BC Managed Care – PPO | Admitting: Endocrinology

## 2021-11-23 ENCOUNTER — Other Ambulatory Visit: Payer: Self-pay

## 2021-11-23 ENCOUNTER — Encounter: Payer: Self-pay | Admitting: Endocrinology

## 2021-11-23 VITALS — BP 118/72 | HR 76 | Ht 72.0 in | Wt 348.4 lb

## 2021-11-23 DIAGNOSIS — E039 Hypothyroidism, unspecified: Secondary | ICD-10-CM

## 2021-11-23 DIAGNOSIS — E669 Obesity, unspecified: Secondary | ICD-10-CM | POA: Diagnosis not present

## 2021-11-23 DIAGNOSIS — E230 Hypopituitarism: Secondary | ICD-10-CM

## 2021-11-23 DIAGNOSIS — E1169 Type 2 diabetes mellitus with other specified complication: Secondary | ICD-10-CM | POA: Diagnosis not present

## 2021-11-23 LAB — POCT GLYCOSYLATED HEMOGLOBIN (HGB A1C): Hemoglobin A1C: 6.4 % — AB (ref 4.0–5.6)

## 2021-11-23 NOTE — Progress Notes (Signed)
Patient ID: Zachary Murray, male   DOB: [birth year], 56 y.o.   MRN: [id number]              Chief complaint: Endocrinology follow-up  History of Present Illness  PROBLEM 1  Hypogonadism was first diagnosed in 11/18  Since about 2016 or so he has had complaints of fatigue He also feels malaise and listless, mostly in the afternoons and did not feel like doing anything at that time Also has some decreased libido  The diagnosis was confirmed by the low free T level of 6.6 done in 5/19 [facility] was inappropriately low at 6.3 indicating SECONDARY hypogonadism-  Initially tried on CLOMIPHENE 25 mg 3 times a week With this his energy level and libido were improving but only partially This is despite his testosterone level being as high as 661, his LH had gone up to 20  Because of the lack of adequate symptomatic improvement he is also taking AndroGel since 9/19  With testosterone gel he was feeling significantly better with his energy level overall His dose has been adjusted periodic However because of his hemoglobin going up to 18.3 in 03/2019 he was told to reduce the dose to 4 pumps daily He was taking 5 pumps a day on the AndroGel in November when he was seen but because of fatigue and testosterone level of only 212 he was tried on [place] but he could not continue this because of cost  RECENT HISTORY:  In 2021 while on AndroGel he had requested trying clomiphene because of fatigue and he felt it had helped his fatigue and libido Since his LH level had gone up to 19 AndroGel was added and clomiphene was reduced to twice a week Has been taking 2 pumps of ANDROGEL daily as prescribed regularly  He has been quite consistent with taking his clomiphene twice a week in the evenings  His testosterone levels have been consistently normal His last visit was in 4/22 Did not complain of any unusual fatigue  However usually the testosterone levels do not correlate with his symptoms of  fatigue  Previously his hemoglobin has been as high as 18 but is now consistently normal He is still able to donate blood up to every 2 months  Testosterone level history:  Lab Results  Component Value Date   TESTOSTERONE 403.16 11/21/2021   TESTOSTERONE 428.49 03/06/2021   TESTOSTERONE 544.48 10/27/2020   TESTOSTERONE 405.32 08/29/2020    Prolactin level: 5.8  Lab Results  Component Value Date   LH 19.08 (H) 06/07/2020   Lab Results  Component Value Date   HGB 15.5 08/06/2021    PROBLEM 2: DIABETES:  Diagnosis date: ?  2016  Previous history:  Although he was diagnosed to have diabetes in 2016 he was not able to tolerate metformin that was previously prescribed Also was tried on [place] in 2017 and 2018 with some improved control He was tried on Bydureon in 06/2017 and continued until mil 3/19 d He was started on [place] insulin in 01/2018 by his PCP when his A1c was 7.8 and fasting glucose 165 Insulin was stopped in 5/19 since his blood sugar was averaging only 92 on his freestyle sensor at home  Recent history:     His most recent A1c is 6.4 although had gone up to 7% in 9/22  Current management: Ozempic 1 mg weekly  He has been taking Ozempic 1 mg regularly He was told to try 2 mg when his A1c had  gone up but he thinks it was not covered by his insurance After his A1c went up a try to modify his diet and cut back on calories and carbohydrates He is not able to do much exercise because of knee pain but is trying to get little more active now Did not bring his monitor for download today Has been monitoring blood sugars with results as below Also in the last 2 weeks or so he has been changing his diet because of his IBS with gluten-free and low carbohydrate intake With this he has lost 12 pounds since last year       Monitors blood glucose:   One Touch Verio meter         Blood Glucose readings from recall:  Am about 110 PC usually 120-130s,  highest upto 170  PREVIOUSLY:  PRE-MEAL Fasting Lunch Dinner Bedtime Overall  Glucose range:  96-156    141   Mean/median:  122     121   POST-MEAL PC Breakfast PC Lunch PC Dinner  Glucose range:   102, 128  96-155  Mean/median:   124  127      Side effects from medications:  Diarrhea from metformin         Meals:  Usually low-fat                    Dietician visit:   Never  Weight control:  Wt Readings from Last 3 Encounters:  11/23/21 (!) 348 lb 6.4 oz (158 kg)  11/21/21 (!) 351 lb (159.2 kg)  08/06/21 (!) 360 lb (163.3 kg)   Lab Results  Component Value Date   HGBA1C 7.0 (H) 08/06/2021   HGBA1C 6.3 03/06/2021   HGBA1C 5.9 10/27/2020   Lab Results  Component Value Date   MICROALBUR 3.3 (H) 08/06/2021   LDLCALC 92 08/06/2021   CREATININE 0.93 11/21/2021    HYPOTHYROIDISM:  He was previously given levothyroxine for high TSH in 12/16 but later stopped because of improved TSH  Because of his fatigue he had been started on levothyroxine in 3/19 with only slight improvement in his fatigue  He has been taking 75 mcg of levothyroxine and has been on the same dose since July 2019  TSH and free T4 consistently normal as below           Lab Results  Component Value Date   TSH 2.16 08/06/2021   TSH 2.31 03/06/2021   TSH 2.10 06/07/2020   FREET4 1.29 11/21/2021   FREET4 0.96 03/06/2021   FREET4 1.16 06/07/2020         Allergies as of 11/23/2021       Reactions   Metformin And Related Diarrhea   Wellbutrin [bupropion] Other (See Comments)   tremors   Lisinopril Cough        Medication List        Accurate as of November 23, 2021 11:38 AM. If you have any questions, ask your nurse or doctor.          ALPRAZolam 0.5 MG tablet Commonly known as: XANAX Take 1 tablet (0.5 mg total) by mouth 3 (three) times daily as needed for anxiety.   cilostazol 100 MG tablet Commonly known as: PLETAL TAKE 1 TABLET(100 MG) BY MOUTH TWICE DAILY   clomiPHENE 50  MG tablet Commonly known as: CLOMID TAKE 1/2 TABLET BY MOUTH 3 TIMES A WEEK   GLUCOSAMINE-MSM PO Take 1 capsule by mouth daily.   glucose blood test strip  Use onetouch verio flex test strip as instructed to check blood sugar once daily.   OneTouch Verio test strip Generic drug: glucose blood 1 each by Other route as needed for other. Use as instructed   levothyroxine 75 MCG tablet Commonly known as: SYNTHROID TAKE 1 TABLET(75 MCG) BY MOUTH DAILY BEFORE AND BREAKFAST   onetouch ultrasoft lancets Use onetouch delica lancet to check blood sugar once daily.   OneTouch Delica Plus [id number] Misc Use to check blood sugar once a day   OneTouch Verio Flex System w/Device Kit Use as instructed to check blood sugar once daily.   pantoprazole 40 MG tablet Commonly known as: PROTONIX Take 1 tablet (40 mg total) by mouth daily.   primidone 50 MG tablet Commonly known as: MYSOLINE Take 1 tablet (50 mg total) by mouth at bedtime.   propranolol 40 MG tablet Commonly known as: INDERAL TAKE 1 TABLET(40 MG) BY MOUTH THREE TIMES DAILY   ReliOn Pen Needles 31G X 6 MM Misc Generic drug: Insulin Pen Needle Inject 1 Act into the skin once a week. Use as needed for insulin injection.   rosuvastatin 10 MG tablet Commonly known as: CRESTOR Take 1 tablet (10 mg total) by mouth daily.   Semaglutide (1 MG/DOSE) 4 MG/3ML Sopn Inject 1 mg as directed once a week.   Testosterone 1.62 % Gel APPLY 1 PUMP ON EACH UPPER ARM EVERY DAY   triamterene-hydrochlorothiazide 37.5-25 MG tablet Commonly known as: MAXZIDE-25 Take 1 tablet by mouth daily.   Vitamin D (Ergocalciferol) 1.25 MG (50000 UNIT) Caps capsule Commonly known as: DRISDOL TAKE 1 CAPSULE BY MOUTH EVERY 7 DAYS        Allergies:  Allergies  Allergen Reactions   Metformin And Related Diarrhea   Wellbutrin [Bupropion] Other (See Comments)    tremors   Lisinopril Cough    Past Medical History:  Diagnosis Date   Hypertension     Obesity    OSA on CPAP    Type II diabetes mellitus with manifestations [facility])     Past Surgical History:  Procedure Laterality Date   BIOPSY  08/31/2018   Procedure: BIOPSY;  Surgeon: Mauri Pole, MD;  Location: [facility];  Service: Endoscopy;;   COLONOSCOPY WITH PROPOFOL N/A 11/12/2016   Procedure: COLONOSCOPY WITH PROPOFOL;  Surgeon: Mauri Pole, MD;  Location: [facility];  Service: Endoscopy;  Laterality: N/A;   COLONOSCOPY WITH PROPOFOL N/A 08/31/2018   Procedure: COLONOSCOPY WITH PROPOFOL;  Surgeon: Mauri Pole, MD;  Location: [facility];  Service: Endoscopy;  Laterality: N/A;   POLYPECTOMY  08/31/2018   Procedure: POLYPECTOMY;  Surgeon: Mauri Pole, MD;  Location: [facility];  Service: Endoscopy;;   WISDOM TOOTH EXTRACTION      Family History  Problem Relation Age of Onset   Colon cancer Father    Hypertension Father    Thyroid cancer Mother    Colon polyps Mother    Thyroid disease Mother    Thyroid disease Sister    Thyroid disease Maternal Grandmother    Alcohol abuse Neg Hx    COPD Neg Hx    Diabetes Neg Hx    Early death Neg Hx    Hearing loss Neg Hx    Heart disease Neg Hx    Hyperlipidemia Neg Hx    Kidney disease Neg Hx    Stroke Neg Hx    Esophageal cancer Neg Hx     Social History:  reports that he has never smoked. He has  quit using smokeless tobacco.  His smokeless tobacco use included chew. He reports that he does not drink alcohol and does not use drugs.  Review of Systems    LIPIDS: LDL below 100, not on a statin drug Followed by PCP  Lab Results  Component Value Date   CHOL 144 08/06/2021   HDL 37.50 (L) 08/06/2021   LDLCALC 92 08/06/2021   TRIG 74.0 08/06/2021   CHOLHDL 4 08/06/2021   BP Readings from Last 3 Encounters:  11/23/21 118/72  11/21/21 128/72  08/06/21 122/70     General Examination:   BP 118/72    Pulse 76    Ht 6' (1.829 m)    Wt (!) 348 lb 6.4 oz (158 kg)    SpO2 97%    BMI  47.25 kg/m   Assessment/ Plan:   HYPOGONADOTROPIC HYPOGONADISM with symptoms of fatigue and low free testosterone at baseline  At baseline was symptomatic with fatigue and decreased libido  Currently on clomiphene 2 days a week with AndroGel 1 pump on each arm daily Testosterone levels have been recently in the 400-500 range consistently He thinks his libido is better with clomiphene His symptoms of fatigue have resolved with treatment of other conditions also  No increase in hemoglobin, helped by periodically doing blood donation  DIABETES with morbid obesity  This is mild and relatively stable although A1c did go up to 7% last year Although he is still on the same dose of Ozempic he likely has improved his diet since last year and especially recently has lost weight Exercise level is somewhat limited He feels that his blood sugars are usually well controlled and occasionally may be higher postprandially, recently with changing diet with carbohydrate reduction blood sugars are better   Hypothyroidism: Adequately replaced but will need follow-up  Liver function abnormality: This is likely to be from starting Crestor and will forward to PCP  There are no Patient Instructions on file for this visit.  Elayne Snare 11/23/2021, 11:38 AM      Note: This office note was prepared with Dragon voice recognition system technology. Any transcriptional errors that result from this process are unintentional.     Elayne Snare 11/23/2021, 11:38 AM

## 2021-11-30 ENCOUNTER — Ambulatory Visit: Payer: BC Managed Care – PPO | Admitting: Nurse Practitioner

## 2021-12-16 ENCOUNTER — Other Ambulatory Visit: Payer: Self-pay | Admitting: Internal Medicine

## 2021-12-16 DIAGNOSIS — E118 Type 2 diabetes mellitus with unspecified complications: Secondary | ICD-10-CM

## 2021-12-17 ENCOUNTER — Other Ambulatory Visit: Payer: Self-pay | Admitting: Internal Medicine

## 2021-12-17 ENCOUNTER — Encounter: Payer: Self-pay | Admitting: Internal Medicine

## 2021-12-17 DIAGNOSIS — E118 Type 2 diabetes mellitus with unspecified complications: Secondary | ICD-10-CM

## 2021-12-18 ENCOUNTER — Ambulatory Visit: Payer: BC Managed Care – PPO | Admitting: Internal Medicine

## 2021-12-18 ENCOUNTER — Other Ambulatory Visit: Payer: Self-pay

## 2021-12-18 ENCOUNTER — Encounter: Payer: Self-pay | Admitting: Internal Medicine

## 2021-12-18 VITALS — BP 118/76 | HR 87 | Temp 97.4°F | Resp 16 | Ht 72.0 in | Wt 345.0 lb

## 2021-12-18 DIAGNOSIS — E118 Type 2 diabetes mellitus with unspecified complications: Secondary | ICD-10-CM | POA: Diagnosis not present

## 2021-12-18 DIAGNOSIS — K591 Functional diarrhea: Secondary | ICD-10-CM | POA: Diagnosis not present

## 2021-12-18 DIAGNOSIS — R7989 Other specified abnormal findings of blood chemistry: Secondary | ICD-10-CM | POA: Insufficient documentation

## 2021-12-18 DIAGNOSIS — I10 Essential (primary) hypertension: Secondary | ICD-10-CM | POA: Diagnosis not present

## 2021-12-18 DIAGNOSIS — E039 Hypothyroidism, unspecified: Secondary | ICD-10-CM

## 2021-12-18 DIAGNOSIS — K580 Irritable bowel syndrome with diarrhea: Secondary | ICD-10-CM

## 2021-12-18 LAB — HEPATIC FUNCTION PANEL
ALT: 15 U/L (ref 0–53)
AST: 21 U/L (ref 0–37)
Albumin: 4.4 g/dL (ref 3.5–5.2)
Alkaline Phosphatase: 58 U/L (ref 39–117)
Bilirubin, Direct: 0.2 mg/dL (ref 0.0–0.3)
Total Bilirubin: 0.9 mg/dL (ref 0.2–1.2)
Total Protein: 7.5 g/dL (ref 6.0–8.3)

## 2021-12-18 LAB — PROTIME-INR
INR: 1.2 ratio — ABNORMAL HIGH (ref 0.8–1.0)
Prothrombin Time: 13.4 s — ABNORMAL HIGH (ref 9.6–13.1)

## 2021-12-18 LAB — BASIC METABOLIC PANEL
BUN: 12 mg/dL (ref 6–23)
CO2: 34 mEq/L — ABNORMAL HIGH (ref 19–32)
Calcium: 10 mg/dL (ref 8.4–10.5)
Chloride: 99 mEq/L (ref 96–112)
Creatinine, Ser: 0.97 mg/dL (ref 0.40–1.50)
GFR: 86.33 mL/min (ref >60.00)
Glucose, Bld: 130 mg/dL — ABNORMAL HIGH (ref 70–99)
Potassium: 3.8 mEq/L (ref 3.5–5.1)
Sodium: 139 mEq/L (ref 135–145)

## 2021-12-18 MED ORDER — SEMAGLUTIDE (1 MG/DOSE) 4 MG/3ML ~~LOC~~ SOPN: 1.0000 mg | PEN_INJECTOR | SUBCUTANEOUS | 1 refills | Status: DC

## 2021-12-18 NOTE — Patient Instructions (Signed)

## 2021-12-18 NOTE — Progress Notes (Signed)
Subjective:  Patient ID: Zachary Murray, male    DOB: 03-28-1966  Age: 56 y.o. MRN: [id number]  CC: Hypertension, Hypothyroidism, and Diabetes  This visit occurred during the SARS-CoV-2 public health emergency.  Safety protocols were in place, including screening questions prior to the visit, additional usage of staff PPE, and extensive cleaning of exam room while observing appropriate contact time as indicated for disinfecting solutions.    [name] presents for f/up -   He has chronic intermittent diarrhea.  He has an occasional flareup with crampy abdominal pain and explosive diarrhea.  His last episode occurred about a month or 2 ago.  He recently saw his endocrinologist and was found to have elevated liver enzymes.  He has had no abdominal pain for several weeks.  He denies nausea, vomiting, loss of appetite, weight loss, or bright red blood per rectum.  He does not get symptom relief with Imodium.   I had another flare up last night (lower gi discomfort and gas followed by uncontrollable diarrhea-no pain).  My best guess would be that the cause was a salad dressing high in onions and celery. I see that you prescribed Xifaxan.   Outpatient Medications Prior to Visit  Medication Sig Dispense Refill   ALPRAZolam (XANAX) 0.5 MG tablet Take 1 tablet (0.5 mg total) by mouth 3 (three) times daily as needed for anxiety. 270 tablet 0   Blood Glucose Monitoring Suppl (ONETOUCH VERIO FLEX SYSTEM) w/Device KIT Use as instructed to check blood sugar once daily. 1 kit 0   clomiPHENE (CLOMID) 50 MG tablet TAKE 1/2 TABLET BY MOUTH 3 TIMES A WEEK 20 tablet 1   glucose blood (ONETOUCH VERIO) test strip 1 each by Other route as needed for other. Use as instructed 100 each 1   glucose blood test strip Use onetouch verio flex test strip as instructed to check blood sugar once daily. 100 each 3   Insulin Pen Needle (RELION PEN NEEDLES) 31G X 6 MM MISC Inject 1 Act into the skin once a week. Use as  needed for insulin injection. 30 each 0   Lancets (ONETOUCH DELICA PLUS LANCET33G) MISC Use to check blood sugar once a day 100 each 1   Lancets (ONETOUCH ULTRASOFT) lancets Use onetouch delica lancet to check blood sugar once daily. 100 each 3   levothyroxine (SYNTHROID) 75 MCG tablet TAKE 1 TABLET(75 MCG) BY MOUTH DAILY BEFORE AND BREAKFAST 90 tablet 1   pantoprazole (PROTONIX) 40 MG tablet Take 1 tablet (40 mg total) by mouth daily. 90 tablet 0   primidone (MYSOLINE) 50 MG tablet Take 1 tablet (50 mg total) by mouth at bedtime. 90 tablet 0   propranolol (INDERAL) 40 MG tablet TAKE 1 TABLET(40 MG) BY MOUTH THREE TIMES DAILY 270 tablet 0   rosuvastatin (CRESTOR) 10 MG tablet Take 1 tablet (10 mg total) by mouth daily. 90 tablet 1   Testosterone 1.62 % GEL APPLY 1 PUMP ON EACH UPPER ARM EVERY DAY 75 g 5   Vitamin D, Ergocalciferol, (DRISDOL) 1.25 MG (50000 UNIT) CAPS capsule TAKE 1 CAPSULE BY MOUTH EVERY 7 DAYS 12 capsule 0   cilostazol (PLETAL) 100 MG tablet TAKE 1 TABLET(100 MG) BY MOUTH TWICE DAILY 180 tablet 0   Semaglutide, 1 MG/DOSE, 4 MG/3ML SOPN Inject 1 mg as directed once a week. 9 mL 0   triamterene-hydrochlorothiazide (MAXZIDE-25) 37.5-25 MG tablet Take 1 tablet by mouth daily. 90 tablet 0   Glucosamine HCl-MSM (GLUCOSAMINE-MSM PO) Take 1  capsule by mouth daily. (Patient not taking: Reported on 11/23/2021)     No facility-administered medications prior to visit.    ROS Review of Systems  Constitutional:  Negative for chills, diaphoresis, fatigue, fever and unexpected weight change.  HENT: Negative.    Eyes: Negative.   Respiratory:  Negative for cough, chest tightness, shortness of breath and wheezing.   Cardiovascular:  Negative for chest pain, palpitations and leg swelling.  Gastrointestinal:  Positive for abdominal pain and diarrhea. Negative for blood in stool, nausea and vomiting.  Endocrine: Negative.   Genitourinary: Negative.  Negative for difficulty urinating and  dysuria.  Musculoskeletal: Negative.  Negative for myalgias.  Skin: Negative.  Negative for color change.  Neurological: Negative.  Negative for dizziness, weakness and light-headedness.  Hematological:  Negative for adenopathy. Does not bruise/bleed easily.  Psychiatric/Behavioral: Negative.     Objective:  BP 118/76 (BP Location: Right Arm, Patient Position: Sitting, Cuff Size: Large)    Pulse 87    Temp (!) 97.4 F (36.3 C) (Oral)    Resp 16    Ht 6' (1.829 m)    Wt (!) 345 lb (156.5 kg)    SpO2 98%    BMI 46.79 kg/m   BP Readings from Last 3 Encounters:  12/18/21 118/76  11/23/21 118/72  11/21/21 128/72    Wt Readings from Last 3 Encounters:  12/18/21 (!) 345 lb (156.5 kg)  11/23/21 (!) 348 lb 6.4 oz (158 kg)  11/21/21 (!) 351 lb (159.2 kg)    Physical Exam Vitals reviewed.  Constitutional:      Appearance: He is obese. He is not ill-appearing.  HENT:     Nose: Nose normal.     Mouth/Throat:     Mouth: Mucous membranes are moist.  Eyes:     General: No scleral icterus.    Conjunctiva/sclera: Conjunctivae normal.  Cardiovascular:     Rate and Rhythm: Normal rate and regular rhythm.     Heart sounds: No murmur heard. Pulmonary:     Effort: Pulmonary effort is normal.     Breath sounds: No stridor. No wheezing, rhonchi or rales.  Abdominal:     General: Abdomen is protuberant. Bowel sounds are normal. There is no distension.     Palpations: Abdomen is soft. There is no hepatomegaly, splenomegaly or mass.     Tenderness: There is no abdominal tenderness. There is no guarding.     Hernia: No hernia is present.  Musculoskeletal:        General: Normal range of motion.     Cervical back: Neck supple.     Right lower leg: No edema.     Left lower leg: No edema.  Lymphadenopathy:     Cervical: No cervical adenopathy.  Skin:    General: Skin is warm and dry.  Neurological:     General: No focal deficit present.     Mental Status: He is alert.    Lab Results   Component Value Date   WBC 5.9 08/06/2021   HGB 15.5 08/06/2021   HCT 45.4 08/06/2021   PLT 226.0 08/06/2021   GLUCOSE 130 (H) 12/18/2021   CHOL 144 08/06/2021   TRIG 74.0 08/06/2021   HDL 37.50 (L) 08/06/2021   LDLCALC 92 08/06/2021   ALT 15 12/18/2021   AST 21 12/18/2021   NA 139 12/18/2021   K 3.8 12/18/2021   CL 99 12/18/2021   CREATININE 0.97 12/18/2021   BUN 12 12/18/2021   CO2 34 (H) 12/18/2021  TSH 2.16 08/06/2021   PSA 1.57 08/06/2021   INR 1.2 (H) 12/18/2021   HGBA1C 6.4 (A) 11/23/2021   MICROALBUR 3.3 (H) 08/06/2021    VAS [place] LOWER EXTREMITY VENOUS REFLUX  Result Date: 03/30/2021  Lower Venous Reflux Study Patient Name:  GIANMARCO ROYE  Date of Exam:   03/30/2021 Medical Rec #: [id number]     Accession #:    [id number] Date of Birth: [birth year]     Patient Gender: M Patient Age:   55Y Exam Location:  [facility] Procedure:      VAS [place] LOWER EXTREMITY VENOUS REFLUX Referring Phys: [id number] [place] Dom CAIN --------------------------------------------------------------------------------  Indications: Pain, Swelling, and varicosities.  Comparison Study: 02/27/2021: No evidence of deep vein thrombosis bilaterally.                   Age indeterminate venous thrombosis in the bilateral greater                   saphenous veins. Performing Technologist: Ivan Croft  Examination Guidelines: A complete evaluation includes B-mode imaging, spectral Doppler, color Doppler, and power Doppler as needed of all accessible portions of each vessel. Bilateral testing is considered an integral part of a complete examination. Limited examinations for reoccurring indications may be performed as noted. The reflux portion of the exam is performed with the patient in reverse Trendelenburg. Significant venous reflux is defined as >500 ms in the superficial venous system, and >1 second in the deep venous system.  Venous Reflux Times  +----------------------+---------+------+-----------+------------+-------------+  RIGHT                  Reflux No Reflux Reflux Time Diameter cms Comments                                          Yes                                           +----------------------+---------+------+-----------+------------+-------------+  CFV                    no                                                       +----------------------+---------+------+-----------+------------+-------------+  FV mid                 no                                                       +----------------------+---------+------+-----------+------------+-------------+  Popliteal              no                                                       +----------------------+---------+------+-----------+------------+-------------+  GSV at [facility]                        yes     >500 ms       0.67                    +----------------------+---------+------+-----------+------------+-------------+  GSV prox thigh                    yes     >500 ms       0.68                    +----------------------+---------+------+-----------+------------+-------------+  GSV mid thigh                     yes     >500 ms       0.45                    +----------------------+---------+------+-----------+------------+-------------+  GSV dist thigh                    yes     >500 ms       0.39                    +----------------------+---------+------+-----------+------------+-------------+  GSV at knee                       yes     >500 ms       0.41     out of fascia  +----------------------+---------+------+-----------+------------+-------------+  GSV prox calf                     yes     >500 ms       0.48     out of fascia  +----------------------+---------+------+-----------+------------+-------------+  SSV Pop Fossa          no                               0.49                    +----------------------+---------+------+-----------+------------+-------------+  SSV  prox calf                     yes     >500 ms       0.39                    +----------------------+---------+------+-----------+------------+-------------+  SSV mid calf           no                               0.25                    +----------------------+---------+------+-----------+------------+-------------+  Distal calf perforator            yes     >500 ms                               +----------------------+---------+------+-----------+------------+-------------+  +-------------------+---------+------+-----------+------------+--------+  LEFT                Reflux No Reflux Reflux Time Diameter cms Comments  Yes                                      +-------------------+---------+------+-----------+------------+--------+  CFV                            yes    >1 second                         +-------------------+---------+------+-----------+------------+--------+  FV mid              no                                                  +-------------------+---------+------+-----------+------------+--------+  Popliteal           no                                                  +-------------------+---------+------+-----------+------------+--------+  GSV at SFJ                     yes     >500 ms       0.96               +-------------------+---------+------+-----------+------------+--------+  GSV prox thigh                 yes     >500 ms       0.63               +-------------------+---------+------+-----------+------------+--------+  GSV mid thigh                  yes     >500 ms       0.46               +-------------------+---------+------+-----------+------------+--------+  GSV dist thigh                 yes     >500 ms       0.47               +-------------------+---------+------+-----------+------------+--------+  GSV at knee                    yes     >500 ms       0.55               +-------------------+---------+------+-----------+------------+--------+  GSV  prox calf                  yes     >500 ms       0.51               +-------------------+---------+------+-----------+------------+--------+  SSV Pop Fossa       no                               0.56               +-------------------+---------+------+-----------+------------+--------+  SSV prox calf  no                               0.41               +-------------------+---------+------+-----------+------------+--------+  SSV mid calf        no                               0.33               +-------------------+---------+------+-----------+------------+--------+  Mid calf perforator            yes     >500 ms                          +-------------------+---------+------+-----------+------------+--------+   Summary: Bilateral: - No evidence of deep vein thrombosis seen in the lower extremities, bilaterally, from the common femoral through the popliteal veins.  Right: - Color duplex evaluation of the right lower extremity shows there is thrombus in the distal greater saphenous vein. - Venous reflux is noted in the right sapheno-femoral junction. - Venous reflux is noted in the right greater saphenous vein in the thigh. - Venous reflux is noted in the right greater saphenous vein in the calf. - Venous reflux is noted in the right short saphenous vein. - Venous reflux is noted in the right calf perforator vein.  Left: - Color duplex evaluation of the left lower extremity shows there is thrombus in the distal greater saphenous vein and proximal greater saphenous vein. - Venous reflux is noted in the left common femoral vein. - Venous reflux is noted in the left sapheno-femoral junction. - Venous reflux is noted in the left greater saphenous vein in the thigh. - Venous reflux is noted in the left greater saphenous vein in the calf. - Venous reflux is noted in the left calf perforator vein.  *See table(s) above for measurements and observations. Electronically signed by Servando Snare MD on 03/30/2021 at 5:04:43  PM.    Final     Assessment & Plan:   Markee was seen today for hypertension, hypothyroidism and diabetes.  Diagnoses and all orders for this visit:  Elevated LFTs- His liver enzymes are normal now.  Screening for viral hepatitis is negative.  His MELD score is reassuring.  I recommended that he get vaccinated against hepatitis A and B. -     Hepatic function panel; Future -     Protime-INR; Future -     Hepatitis A antibody, total; Future -     Hepatitis C antibody; Future -     Hepatitis B surface antigen; Future -     Hepatitis B surface antibody,quantitative; Future -     Hepatitis B core antibody, total; Future -     Hepatitis B core antibody, total -     Hepatitis B surface antibody,quantitative -     Hepatitis B surface antigen -     Hepatitis C antibody -     Hepatitis A antibody, total -     Protime-INR -     Hepatic function panel  Acquired hypothyroidism  Type II diabetes mellitus with manifestations ([place])- His blood sugar has been adequately well controlled. -     Semaglutide, 1 MG/DOSE, 4 MG/3ML SOPN; Inject 1 mg as directed once a week. -     Basic metabolic panel;  Future -     Basic metabolic panel  Essential hypertension, benign- His blood pressure is adequately well controlled. -     Basic metabolic panel; Future -     Basic metabolic panel  Functional diarrhea- Testing for celiac disease is negative. -     Gliadin antibodies, serum; Future -     Tissue transglutaminase, IgA; Future -     Reticulin Antibody, IgA w reflex titer; Future -     Reticulin Antibody, IgA w reflex titer -     Tissue transglutaminase, IgA -     Gliadin antibodies, serum  Irritable bowel syndrome with diarrhea -     rifaximin (XIFAXAN) 550 MG TABS tablet; Take 1 tablet (550 mg total) by mouth 3 (three) times daily for 14 days.   I have discontinued Aaron Mose. Ortner "Ken"'s Glucosamine HCl-MSM (GLUCOSAMINE-MSM PO) and triamterene-hydrochlorothiazide. I am also having him start on  rifaximin. Additionally, I am having him maintain his onetouch ultrasoft, glucose blood, OneTouch Verio Flex System, OneTouch Verio, OneTouch Delica Plus [id number], rosuvastatin, ReliOn Pen Needles, ALPRAZolam, propranolol, clomiPHENE, levothyroxine, Vitamin D (Ergocalciferol), Testosterone, primidone, pantoprazole, and Semaglutide (1 MG/DOSE).  Meds ordered this encounter  Medications   Semaglutide, 1 MG/DOSE, 4 MG/3ML SOPN    Sig: Inject 1 mg as directed once a week.    Dispense:  9 mL    Refill:  1   rifaximin (XIFAXAN) 550 MG TABS tablet    Sig: Take 1 tablet (550 mg total) by mouth 3 (three) times daily for 14 days.    Dispense:  42 tablet    Refill:  0     Follow-up: Return in about 3 months (around 03/17/2022).  Scarlette Calico, MD

## 2021-12-19 ENCOUNTER — Encounter: Payer: Self-pay | Admitting: Internal Medicine

## 2021-12-19 ENCOUNTER — Other Ambulatory Visit: Payer: Self-pay | Admitting: Internal Medicine

## 2021-12-19 DIAGNOSIS — I739 Peripheral vascular disease, unspecified: Secondary | ICD-10-CM

## 2021-12-19 DIAGNOSIS — K580 Irritable bowel syndrome with diarrhea: Secondary | ICD-10-CM | POA: Insufficient documentation

## 2021-12-19 DIAGNOSIS — K591 Functional diarrhea: Secondary | ICD-10-CM | POA: Insufficient documentation

## 2021-12-19 HISTORY — DX: Irritable bowel syndrome with diarrhea: K58.0

## 2021-12-19 LAB — HEPATITIS B CORE ANTIBODY, TOTAL: Hep B Core Total Ab: NONREACTIVE

## 2021-12-19 LAB — HEPATITIS B SURFACE ANTIGEN: Hepatitis B Surface Ag: NONREACTIVE

## 2021-12-19 LAB — TISSUE TRANSGLUTAMINASE, IGA: (tTG) Ab, IgA: <1 U/mL

## 2021-12-19 LAB — HEPATITIS C ANTIBODY
Hepatitis C Ab: NONREACTIVE
SIGNAL TO CUT-OFF: <0.02 (ref <1.00)

## 2021-12-19 LAB — GLIADIN ANTIBODIES, SERUM
Gliadin IgA: <1 U/mL
Gliadin IgG: <1 U/mL

## 2021-12-19 LAB — HEPATITIS B SURFACE ANTIBODY, QUANTITATIVE: Hep B S AB Quant (Post): <5 m[IU]/mL — ABNORMAL LOW (ref >=10)

## 2021-12-19 LAB — HEPATITIS A ANTIBODY, TOTAL: Hepatitis A AB,Total: NONREACTIVE

## 2021-12-19 MED ORDER — RIFAXIMIN 550 MG PO TABS: 550.0000 mg | ORAL_TABLET | Freq: Three times a day (TID) | ORAL | 0 refills | Status: AC

## 2021-12-19 NOTE — Telephone Encounter (Signed)
Key: [id number]

## 2021-12-20 LAB — RETICULIN ANTIBODIES, IGA W TITER: Reticulin Ab, IgA: NEGATIVE titer (ref <2.5)

## 2021-12-21 ENCOUNTER — Encounter: Payer: Self-pay | Admitting: Internal Medicine

## 2021-12-22 ENCOUNTER — Encounter: Payer: Self-pay | Admitting: Internal Medicine

## 2021-12-31 ENCOUNTER — Ambulatory Visit: Payer: BC Managed Care – PPO | Admitting: Family Medicine

## 2022-01-02 ENCOUNTER — Other Ambulatory Visit: Payer: Self-pay | Admitting: Internal Medicine

## 2022-01-02 ENCOUNTER — Encounter: Payer: Self-pay | Admitting: Internal Medicine

## 2022-01-02 DIAGNOSIS — I10 Essential (primary) hypertension: Secondary | ICD-10-CM

## 2022-01-02 DIAGNOSIS — F411 Generalized anxiety disorder: Secondary | ICD-10-CM

## 2022-01-07 ENCOUNTER — Encounter: Payer: Self-pay | Admitting: Internal Medicine

## 2022-01-07 ENCOUNTER — Other Ambulatory Visit: Payer: Self-pay | Admitting: Internal Medicine

## 2022-01-07 DIAGNOSIS — E118 Type 2 diabetes mellitus with unspecified complications: Secondary | ICD-10-CM

## 2022-01-07 MED ORDER — TRULICITY 1.5 MG/0.5ML ~~LOC~~ SOAJ: 1.5000 mg | SUBCUTANEOUS | 0 refills | Status: DC

## 2022-01-08 ENCOUNTER — Encounter: Payer: Self-pay | Admitting: Internal Medicine

## 2022-01-08 ENCOUNTER — Other Ambulatory Visit: Payer: Self-pay | Admitting: Internal Medicine

## 2022-01-08 DIAGNOSIS — I10 Essential (primary) hypertension: Secondary | ICD-10-CM

## 2022-01-08 MED ORDER — TRIAMTERENE-HCTZ 37.5-25 MG PO TABS: 1.0000 | ORAL_TABLET | Freq: Every day | ORAL | 1 refills | Status: DC

## 2022-01-13 ENCOUNTER — Other Ambulatory Visit: Payer: Self-pay | Admitting: Internal Medicine

## 2022-01-13 DIAGNOSIS — G250 Essential tremor: Secondary | ICD-10-CM

## 2022-01-23 ENCOUNTER — Other Ambulatory Visit: Payer: Self-pay | Admitting: Internal Medicine

## 2022-01-23 ENCOUNTER — Ambulatory Visit: Payer: BC Managed Care – PPO | Admitting: Family Medicine

## 2022-01-23 DIAGNOSIS — E559 Vitamin D deficiency, unspecified: Secondary | ICD-10-CM

## 2022-01-29 ENCOUNTER — Encounter: Payer: Self-pay | Admitting: Internal Medicine

## 2022-01-30 ENCOUNTER — Other Ambulatory Visit: Payer: Self-pay | Admitting: Internal Medicine

## 2022-01-30 DIAGNOSIS — F411 Generalized anxiety disorder: Secondary | ICD-10-CM

## 2022-01-30 DIAGNOSIS — E785 Hyperlipidemia, unspecified: Secondary | ICD-10-CM

## 2022-01-30 DIAGNOSIS — G250 Essential tremor: Secondary | ICD-10-CM

## 2022-01-30 MED ORDER — ALPRAZOLAM 0.5 MG PO TABS: ORAL_TABLET | ORAL | 0 refills | Status: DC

## 2022-01-30 MED ORDER — PRIMIDONE 50 MG PO TABS: 50.0000 mg | ORAL_TABLET | Freq: Every day | ORAL | 0 refills | Status: DC

## 2022-01-30 MED ORDER — ROSUVASTATIN CALCIUM 10 MG PO TABS: 10.0000 mg | ORAL_TABLET | Freq: Every day | ORAL | 1 refills | Status: DC

## 2022-02-04 DIAGNOSIS — L905 Scar conditions and fibrosis of skin: Secondary | ICD-10-CM | POA: Diagnosis not present

## 2022-02-04 DIAGNOSIS — L570 Actinic keratosis: Secondary | ICD-10-CM | POA: Diagnosis not present

## 2022-02-04 DIAGNOSIS — Z872 Personal history of diseases of the skin and subcutaneous tissue: Secondary | ICD-10-CM | POA: Diagnosis not present

## 2022-02-13 ENCOUNTER — Ambulatory Visit: Payer: BC Managed Care – PPO | Admitting: Family Medicine

## 2022-03-28 ENCOUNTER — Other Ambulatory Visit: Payer: Self-pay | Admitting: Internal Medicine

## 2022-03-28 DIAGNOSIS — E118 Type 2 diabetes mellitus with unspecified complications: Secondary | ICD-10-CM

## 2022-03-28 DIAGNOSIS — I739 Peripheral vascular disease, unspecified: Secondary | ICD-10-CM

## 2022-04-02 ENCOUNTER — Encounter: Payer: Self-pay | Admitting: Internal Medicine

## 2022-04-17 ENCOUNTER — Other Ambulatory Visit: Payer: Self-pay | Admitting: Internal Medicine

## 2022-04-17 ENCOUNTER — Encounter: Payer: Self-pay | Admitting: Internal Medicine

## 2022-04-17 ENCOUNTER — Other Ambulatory Visit: Payer: Self-pay | Admitting: Endocrinology

## 2022-04-17 DIAGNOSIS — E039 Hypothyroidism, unspecified: Secondary | ICD-10-CM

## 2022-04-17 DIAGNOSIS — G250 Essential tremor: Secondary | ICD-10-CM

## 2022-04-17 DIAGNOSIS — E559 Vitamin D deficiency, unspecified: Secondary | ICD-10-CM

## 2022-04-17 DIAGNOSIS — F411 Generalized anxiety disorder: Secondary | ICD-10-CM

## 2022-04-23 ENCOUNTER — Encounter: Payer: Self-pay | Admitting: Internal Medicine

## 2022-04-23 ENCOUNTER — Ambulatory Visit: Payer: BC Managed Care – PPO | Admitting: Internal Medicine

## 2022-04-23 VITALS — BP 118/76 | HR 73 | Temp 98.2°F | Ht 72.0 in | Wt 344.0 lb

## 2022-04-23 DIAGNOSIS — Z23 Encounter for immunization: Secondary | ICD-10-CM

## 2022-04-23 DIAGNOSIS — G250 Essential tremor: Secondary | ICD-10-CM

## 2022-04-23 DIAGNOSIS — I10 Essential (primary) hypertension: Secondary | ICD-10-CM | POA: Diagnosis not present

## 2022-04-23 DIAGNOSIS — E039 Hypothyroidism, unspecified: Secondary | ICD-10-CM | POA: Diagnosis not present

## 2022-04-23 DIAGNOSIS — E118 Type 2 diabetes mellitus with unspecified complications: Secondary | ICD-10-CM | POA: Diagnosis not present

## 2022-04-23 DIAGNOSIS — E559 Vitamin D deficiency, unspecified: Secondary | ICD-10-CM

## 2022-04-23 DIAGNOSIS — F411 Generalized anxiety disorder: Secondary | ICD-10-CM

## 2022-04-23 DIAGNOSIS — E785 Hyperlipidemia, unspecified: Secondary | ICD-10-CM

## 2022-04-23 LAB — CBC WITH DIFFERENTIAL/PLATELET
Basophils Absolute: 0 10*3/uL (ref 0.0–0.1)
Basophils Relative: 0.6 % (ref 0.0–3.0)
Eosinophils Absolute: 0.2 10*3/uL (ref 0.0–0.7)
Eosinophils Relative: 2.9 % (ref 0.0–5.0)
HCT: 47.6 % (ref 39.0–52.0)
Hemoglobin: 16.2 g/dL (ref 13.0–17.0)
Lymphocytes Relative: 20.5 % (ref 12.0–46.0)
Lymphs Abs: 1.3 10*3/uL (ref 0.7–4.0)
MCHC: 34.1 g/dL (ref 30.0–36.0)
MCV: 87.5 fl (ref 78.0–100.0)
Monocytes Absolute: 0.7 10*3/uL (ref 0.1–1.0)
Monocytes Relative: 11.1 % (ref 3.0–12.0)
Neutro Abs: 4.1 10*3/uL (ref 1.4–7.7)
Neutrophils Relative %: 64.9 % (ref 43.0–77.0)
Platelets: 209 10*3/uL (ref 150.0–400.0)
RBC: 5.43 Mil/uL (ref 4.22–5.81)
RDW: 13.9 % (ref 11.5–15.5)
WBC: 6.4 10*3/uL (ref 4.0–10.5)

## 2022-04-23 LAB — HEPATIC FUNCTION PANEL
ALT: 17 U/L (ref 0–53)
AST: 18 U/L (ref 0–37)
Albumin: 4.4 g/dL (ref 3.5–5.2)
Alkaline Phosphatase: 67 U/L (ref 39–117)
Bilirubin, Direct: 0.2 mg/dL (ref 0.0–0.3)
Total Bilirubin: 1.2 mg/dL (ref 0.2–1.2)
Total Protein: 7.6 g/dL (ref 6.0–8.3)

## 2022-04-23 LAB — BASIC METABOLIC PANEL
BUN: 15 mg/dL (ref 6–23)
CO2: 31 mEq/L (ref 19–32)
Calcium: 10.1 mg/dL (ref 8.4–10.5)
Chloride: 98 mEq/L (ref 96–112)
Creatinine, Ser: 0.86 mg/dL (ref 0.40–1.50)
GFR: 95.52 mL/min (ref >60.00)
Glucose, Bld: 161 mg/dL — ABNORMAL HIGH (ref 70–99)
Potassium: 4 mEq/L (ref 3.5–5.1)
Sodium: 139 mEq/L (ref 135–145)

## 2022-04-23 LAB — VITAMIN D 25 HYDROXY (VIT D DEFICIENCY, FRACTURES): VITD: 94.86 ng/mL (ref 30.00–100.00)

## 2022-04-23 LAB — TSH: TSH: 2.82 u[IU]/mL (ref 0.35–5.50)

## 2022-04-23 LAB — HEMOGLOBIN A1C: Hgb A1c MFr Bld: 8 % — ABNORMAL HIGH (ref 4.6–6.5)

## 2022-04-23 MED ORDER — PRIMIDONE 50 MG PO TABS: 50.0000 mg | ORAL_TABLET | Freq: Every day | ORAL | 1 refills | Status: DC

## 2022-04-23 MED ORDER — ROSUVASTATIN CALCIUM 10 MG PO TABS: 10.0000 mg | ORAL_TABLET | Freq: Every day | ORAL | 1 refills | Status: DC

## 2022-04-23 MED ORDER — PROPRANOLOL HCL 40 MG PO TABS: 40.0000 mg | ORAL_TABLET | Freq: Three times a day (TID) | ORAL | 1 refills | Status: DC

## 2022-04-23 MED ORDER — ALPRAZOLAM 0.5 MG PO TABS: ORAL_TABLET | ORAL | 1 refills | Status: DC

## 2022-04-23 MED ORDER — TIRZEPATIDE 2.5 MG/0.5ML ~~LOC~~ SOAJ: 2.5000 mg | SUBCUTANEOUS | 0 refills | Status: DC

## 2022-04-23 NOTE — Patient Instructions (Signed)

## 2022-04-23 NOTE — Progress Notes (Addendum)
Subjective:  Patient ID: Zachary Murray, male    DOB: [birth year]  Age: 56 y.o. MRN: [id number]  CC: Diabetes, Hypertension, and Hypothyroidism   [name] presents for f/up -  He continues to complain of intermittent diarrhea.  He denies abdominal pain, nausea, vomiting, chest pain, shortness of breath, or edema.  He complains that Trulicity is too expensive.  Outpatient Medications Prior to Visit  Medication Sig Dispense Refill   Blood Glucose Monitoring Suppl (ONETOUCH VERIO FLEX SYSTEM) w/Device KIT Use as instructed to check blood sugar once daily. 1 kit 0   cilostazol (PLETAL) 100 MG tablet TAKE 1 TABLET(100 MG) BY MOUTH TWICE DAILY 180 tablet 0   clomiPHENE (CLOMID) 50 MG tablet TAKE 1/2 TABLET BY MOUTH 3 TIMES A WEEK 20 tablet 1   glucose blood (ONETOUCH VERIO) test strip 1 each by Other route as needed for other. Use as instructed 100 each 1   glucose blood test strip Use onetouch verio flex test strip as instructed to check blood sugar once daily. 100 each 3   Insulin Pen Needle (RELION PEN NEEDLES) 31G X 6 MM MISC Inject 1 Act into the skin once a week. Use as needed for insulin injection. 30 each 0   Lancets (ONETOUCH DELICA PLUS LANCET33G) MISC Use to check blood sugar once a day 100 each 1   Lancets (ONETOUCH ULTRASOFT) lancets Use onetouch delica lancet to check blood sugar once daily. 100 each 3   levothyroxine (SYNTHROID) 75 MCG tablet TAKE 1 TABLET(75 MCG) BY MOUTH DAILY BEFORE AND BREAKFAST 90 tablet 1   pantoprazole (PROTONIX) 40 MG tablet Take 1 tablet (40 mg total) by mouth daily. 90 tablet 0   Testosterone 1.62 % GEL APPLY 1 PUMP ON EACH UPPER ARM EVERY DAY 75 g 5   triamterene-hydrochlorothiazide (MAXZIDE-25) 37.5-25 MG tablet Take 1 tablet by mouth daily. 90 tablet 1   ALPRAZolam (XANAX) 0.5 MG tablet TAKE 1 TABLET(0.5 MG) BY MOUTH THREE TIMES DAILY AS NEEDED FOR ANXIETY 270 tablet 0   primidone (MYSOLINE) 50 MG tablet Take 1 tablet (50 mg total) by mouth at  bedtime. 90 tablet 0   propranolol (INDERAL) 40 MG tablet TAKE 1 TABLET(40 MG) BY MOUTH THREE TIMES DAILY 270 tablet 0   rosuvastatin (CRESTOR) 10 MG tablet Take 1 tablet (10 mg total) by mouth daily. 90 tablet 1   TRULICITY 1.5 MG/0.5ML SOPN ADMINISTER 1.5 MG UNDER THE SKIN 1 TIME A WEEK 4 mL 0   Vitamin D, Ergocalciferol, (DRISDOL) 1.25 MG (50000 UNIT) CAPS capsule TAKE 1 CAPSULE BY MOUTH EVERY 7 DAYS 4 capsule 0   No facility-administered medications prior to visit.    ROS Review of Systems  Constitutional: Negative.  Negative for diaphoresis, fatigue and unexpected weight change.  HENT: Negative.    Eyes: Negative.   Respiratory:  Negative for cough, chest tightness, shortness of breath and wheezing.   Cardiovascular:  Negative for chest pain, palpitations and leg swelling.  Gastrointestinal:  Positive for diarrhea. Negative for abdominal pain, blood in stool, constipation, nausea and vomiting.  Genitourinary: Negative.  Negative for difficulty urinating.  Musculoskeletal:  Positive for arthralgias. Negative for myalgias.  Skin: Negative.   Neurological:  Positive for tremors. Negative for dizziness, weakness and light-headedness.  Hematological:  Negative for adenopathy. Does not bruise/bleed easily.  Psychiatric/Behavioral:  Negative for behavioral problems, dysphoric mood, sleep disturbance and suicidal ideas. The patient is nervous/anxious.     Objective:  BP 118/76 (BP Location: Right  Arm, Patient Position: Sitting, Cuff Size: Large)   Pulse 73   Temp 98.2 F (36.8 C) (Oral)   Ht 6' (1.829 m)   Wt (!) 344 lb (156 kg)   SpO2 93%   BMI 46.65 kg/m   BP Readings from Last 3 Encounters:  04/23/22 118/76  12/18/21 118/76  11/23/21 118/72    Wt Readings from Last 3 Encounters:  04/23/22 (!) 344 lb (156 kg)  12/18/21 (!) 345 lb (156.5 kg)  11/23/21 (!) 348 lb 6.4 oz (158 kg)    Physical Exam Constitutional:      Appearance: He is not ill-appearing.  HENT:      Nose: Nose normal.     Mouth/Throat:     Mouth: Mucous membranes are moist.  Eyes:     General: No scleral icterus.    Conjunctiva/sclera: Conjunctivae normal.  Cardiovascular:     Rate and Rhythm: Normal rate and regular rhythm.     Heart sounds: No murmur heard. Pulmonary:     Effort: Pulmonary effort is normal.     Breath sounds: No stridor. No wheezing, rhonchi or rales.  Abdominal:     General: Abdomen is protuberant. Bowel sounds are normal. There is no distension.     Palpations: Abdomen is soft. There is no hepatomegaly, splenomegaly or mass.     Tenderness: There is no abdominal tenderness. There is no guarding.  Musculoskeletal:        General: Normal range of motion.     Cervical back: Neck supple.     Right lower leg: No edema.     Left lower leg: No edema.  Lymphadenopathy:     Cervical: No cervical adenopathy.  Skin:    General: Skin is warm and dry.  Neurological:     Mental Status: He is alert. Mental status is at baseline.  Psychiatric:        Mood and Affect: Mood normal.        Behavior: Behavior normal.     Lab Results  Component Value Date   WBC 6.4 04/23/2022   HGB 16.2 04/23/2022   HCT 47.6 04/23/2022   PLT 209.0 04/23/2022   GLUCOSE 161 (H) 04/23/2022   CHOL 144 08/06/2021   TRIG 74.0 08/06/2021   HDL 37.50 (L) 08/06/2021   LDLCALC 92 08/06/2021   ALT 17 04/23/2022   AST 18 04/23/2022   NA 139 04/23/2022   K 4.0 04/23/2022   CL 98 04/23/2022   CREATININE 0.86 04/23/2022   BUN 15 04/23/2022   CO2 31 04/23/2022   TSH 2.82 04/23/2022   PSA 1.57 08/06/2021   INR 1.2 (H) 12/18/2021   HGBA1C 8.0 (H) 04/23/2022   MICROALBUR 3.3 (H) 08/06/2021    VAS [place] LOWER EXTREMITY VENOUS REFLUX  Result Date: 03/30/2021  Lower Venous Reflux Study Patient Name:  Zachary Murray  Date of Exam:   03/30/2021 Medical Rec #: [id number]     Accession #:    [id number] Date of Birth: 1966/03/21     Patient Gender: M Patient Age:   55Y Exam Location:  [facility] Procedure:      VAS [place] LOWER EXTREMITY VENOUS REFLUX Referring Phys: [id number] [place] Dom CAIN --------------------------------------------------------------------------------  Indications: Pain, Swelling, and varicosities.  Comparison Study: 02/27/2021: No evidence of deep vein thrombosis bilaterally.                   Age indeterminate venous thrombosis in the bilateral greater  saphenous veins. Performing Technologist: Ivan Croft  Examination Guidelines: A complete evaluation includes B-mode imaging, spectral Doppler, color Doppler, and power Doppler as needed of all accessible portions of each vessel. Bilateral testing is considered an integral part of a complete examination. Limited examinations for reoccurring indications may be performed as noted. The reflux portion of the exam is performed with the patient in reverse Trendelenburg. Significant venous reflux is defined as >500 ms in the superficial venous system, and >1 second in the deep venous system.  Venous Reflux Times +----------------------+---------+------+-----------+------------+-------------+ RIGHT                 Reflux NoRefluxReflux TimeDiameter cmsComments                                      Yes                                       +----------------------+---------+------+-----------+------------+-------------+ CFV                   no                                                  +----------------------+---------+------+-----------+------------+-------------+ FV mid                no                                                  +----------------------+---------+------+-----------+------------+-------------+ Popliteal             no                                                  +----------------------+---------+------+-----------+------------+-------------+ GSV at SFJ                      yes    >500 ms      0.67                   +----------------------+---------+------+-----------+------------+-------------+ GSV prox thigh                  yes    >500 ms      0.68                  +----------------------+---------+------+-----------+------------+-------------+ GSV mid thigh                   yes    >500 ms      0.45                  +----------------------+---------+------+-----------+------------+-------------+ GSV dist thigh                  yes    >500 ms      0.39                  +----------------------+---------+------+-----------+------------+-------------+ GSV at  knee                     yes    >500 ms      0.41    out of fascia +----------------------+---------+------+-----------+------------+-------------+ GSV prox calf                   yes    >500 ms      0.48    out of fascia +----------------------+---------+------+-----------+------------+-------------+ SSV Pop Fossa         no                            0.49                  +----------------------+---------+------+-----------+------------+-------------+ SSV prox calf                   yes    >500 ms      0.39                  +----------------------+---------+------+-----------+------------+-------------+ SSV mid calf          no                            0.25                  +----------------------+---------+------+-----------+------------+-------------+ Distal calf perforator          yes    >500 ms                            +----------------------+---------+------+-----------+------------+-------------+  +-------------------+---------+------+-----------+------------+--------+ LEFT               Reflux NoRefluxReflux TimeDiameter cmsComments                              Yes                                  +-------------------+---------+------+-----------+------------+--------+ CFV                          yes   >1 second                       +-------------------+---------+------+-----------+------------+--------+ FV mid             no                                             +-------------------+---------+------+-----------+------------+--------+ Popliteal          no                                             +-------------------+---------+------+-----------+------------+--------+ GSV at SFJ                   yes    >500 ms      0.96             +-------------------+---------+------+-----------+------------+--------+ GSV prox thigh  yes    >500 ms      0.63             +-------------------+---------+------+-----------+------------+--------+ GSV mid thigh                yes    >500 ms      0.46             +-------------------+---------+------+-----------+------------+--------+ GSV dist thigh               yes    >500 ms      0.47             +-------------------+---------+------+-----------+------------+--------+ GSV at knee                  yes    >500 ms      0.55             +-------------------+---------+------+-----------+------------+--------+ GSV prox calf                yes    >500 ms      0.51             +-------------------+---------+------+-----------+------------+--------+ SSV Pop Fossa      no                            0.56             +-------------------+---------+------+-----------+------------+--------+ SSV prox calf      no                            0.41             +-------------------+---------+------+-----------+------------+--------+ SSV mid calf       no                            0.33             +-------------------+---------+------+-----------+------------+--------+ Mid calf perforator          yes    >500 ms                       +-------------------+---------+------+-----------+------------+--------+   Summary: Bilateral: - No evidence of deep vein thrombosis seen in the lower extremities, bilaterally, from the common  femoral through the popliteal veins.  Right: - Color duplex evaluation of the right lower extremity shows there is thrombus in the distal greater saphenous vein. - Venous reflux is noted in the right sapheno-femoral junction. - Venous reflux is noted in the right greater saphenous vein in the thigh. - Venous reflux is noted in the right greater saphenous vein in the calf. - Venous reflux is noted in the right short saphenous vein. - Venous reflux is noted in the right calf perforator vein.  Left: - Color duplex evaluation of the left lower extremity shows there is thrombus in the distal greater saphenous vein and proximal greater saphenous vein. - Venous reflux is noted in the left common femoral vein. - Venous reflux is noted in the left sapheno-femoral junction. - Venous reflux is noted in the left greater saphenous vein in the thigh. - Venous reflux is noted in the left greater saphenous vein in the calf. - Venous reflux is noted in the left calf perforator vein.  *See table(s) above for measurements and observations. Electronically signed by Servando Snare MD on 03/30/2021 at  5:04:43 PM.    Final     Assessment & Plan:   Sian was seen today for diabetes, hypertension and hypothyroidism.  Diagnoses and all orders for this visit:  Essential hypertension, benign- His blood pressure is adequately well controlled. -     Basic metabolic panel; Future -     CBC with Differential/Platelet; Future -     Hepatic function panel; Future -     Hepatic function panel -     CBC with Differential/Platelet -     Basic metabolic panel  Type II diabetes mellitus with manifestations ([place])- His blood sugar is not adequately well controlled.  I recommended that he start using a GLP/GIP agonist. -     Basic metabolic panel; Future -     Hemoglobin A1c; Future -     Hemoglobin A1c -     Basic metabolic panel -     HM Diabetes Foot Exam -     tirzepatide (MOUNJARO) 2.5 MG/0.5ML Pen; Inject 2.5 mg into the skin once  a week.  Vitamin D deficiency- His vitamin D level is up to 95.  I recommended that he stop taking a vitamin D supplement. -     VITAMIN D 25 Hydroxy (Vit-D Deficiency, Fractures); Future -     VITAMIN D 25 Hydroxy (Vit-D Deficiency, Fractures)  Tremor, essential -     primidone (MYSOLINE) 50 MG tablet; Take 1 tablet (50 mg total) by mouth at bedtime. -     propranolol (INDERAL) 40 MG tablet; Take 1 tablet (40 mg total) by mouth 3 (three) times daily.  Acquired hypothyroidism- He is euthyroid. -     TSH; Future -     TSH  GAD (generalized anxiety disorder) -     ALPRAZolam (XANAX) 0.5 MG tablet; TAKE 1 TABLET(0.5 MG) BY MOUTH THREE TIMES DAILY AS NEEDED FOR ANXIETY  Dyslipidemia, goal LDL below 70 -     rosuvastatin (CRESTOR) 10 MG tablet; Take 1 tablet (10 mg total) by mouth daily.  Other orders -     Pneumococcal polysaccharide vaccine 23-valent greater than or equal to 2yo subcutaneous/IM   I have discontinued Aaron Mose. Cattell "Ken"'s Trulicity and Vitamin D (Ergocalciferol). I have also changed his propranolol. Additionally, I am having him start on tirzepatide. Lastly, I am having him maintain his onetouch ultrasoft, glucose blood, OneTouch Verio Flex System, OneTouch Verio, OneTouch Delica Plus [id number], ReliOn Pen Needles, clomiPHENE, levothyroxine, pantoprazole, triamterene-hydrochlorothiazide, cilostazol, Testosterone, ALPRAZolam, primidone, and rosuvastatin.  Meds ordered this encounter  Medications   ALPRAZolam (XANAX) 0.5 MG tablet    Sig: TAKE 1 TABLET(0.5 MG) BY MOUTH THREE TIMES DAILY AS NEEDED FOR ANXIETY    Dispense:  270 tablet    Refill:  1   primidone (MYSOLINE) 50 MG tablet    Sig: Take 1 tablet (50 mg total) by mouth at bedtime.    Dispense:  90 tablet    Refill:  1   rosuvastatin (CRESTOR) 10 MG tablet    Sig: Take 1 tablet (10 mg total) by mouth daily.    Dispense:  90 tablet    Refill:  1   propranolol (INDERAL) 40 MG tablet    Sig: Take 1 tablet (40 mg  total) by mouth 3 (three) times daily.    Dispense:  270 tablet    Refill:  1   tirzepatide (MOUNJARO) 2.5 MG/0.5ML Pen    Sig: Inject 2.5 mg into the skin once a week.    Dispense:  2 mL    Refill:  0     Follow-up: Return in about 6 months (around 10/23/2022).  Scarlette Calico, MD

## 2022-04-25 ENCOUNTER — Other Ambulatory Visit: Payer: Self-pay | Admitting: Endocrinology

## 2022-04-25 DIAGNOSIS — E1169 Type 2 diabetes mellitus with other specified complication: Secondary | ICD-10-CM

## 2022-05-06 ENCOUNTER — Encounter: Payer: Self-pay | Admitting: Endocrinology

## 2022-05-06 DIAGNOSIS — E669 Obesity, unspecified: Secondary | ICD-10-CM

## 2022-05-07 MED ORDER — ONETOUCH DELICA PLUS LANCET33G MISC: 1 refills | Status: AC

## 2022-05-07 MED ORDER — ONETOUCH VERIO VI STRP: 1.0000 | ORAL_STRIP | 1 refills | Status: DC | PRN

## 2022-05-14 ENCOUNTER — Ambulatory Visit: Payer: BC Managed Care – PPO | Admitting: Internal Medicine

## 2022-05-17 ENCOUNTER — Other Ambulatory Visit: Payer: Self-pay | Admitting: Internal Medicine

## 2022-05-17 DIAGNOSIS — E039 Hypothyroidism, unspecified: Secondary | ICD-10-CM

## 2022-05-17 DIAGNOSIS — E118 Type 2 diabetes mellitus with unspecified complications: Secondary | ICD-10-CM

## 2022-05-17 MED ORDER — TIRZEPATIDE 5 MG/0.5ML ~~LOC~~ SOAJ: 5.0000 mg | SUBCUTANEOUS | 0 refills | Status: DC

## 2022-05-29 ENCOUNTER — Other Ambulatory Visit: Payer: BC Managed Care – PPO

## 2022-05-31 ENCOUNTER — Ambulatory Visit: Payer: BC Managed Care – PPO | Admitting: Endocrinology

## 2022-06-03 ENCOUNTER — Encounter: Payer: Self-pay | Admitting: Internal Medicine

## 2022-06-03 ENCOUNTER — Other Ambulatory Visit: Payer: Self-pay | Admitting: Internal Medicine

## 2022-06-05 ENCOUNTER — Other Ambulatory Visit: Payer: Self-pay | Admitting: Internal Medicine

## 2022-06-05 DIAGNOSIS — E119 Type 2 diabetes mellitus without complications: Secondary | ICD-10-CM | POA: Insufficient documentation

## 2022-06-05 HISTORY — DX: Type 2 diabetes mellitus without complications: E11.9

## 2022-06-05 MED ORDER — SOLIQUA 100-33 UNT-MCG/ML ~~LOC~~ SOPN: 20.0000 [IU] | PEN_INJECTOR | Freq: Every day | SUBCUTANEOUS | 0 refills | Status: DC

## 2022-06-05 MED ORDER — FREESTYLE LIBRE 2 READER DEVI: 1.0000 | Freq: Every day | 5 refills | Status: DC

## 2022-06-05 MED ORDER — FREESTYLE LIBRE 2 SENSOR MISC: 1.0000 | Freq: Every day | 5 refills | Status: DC

## 2022-06-05 NOTE — Progress Notes (Unsigned)
Lab Results  Component Value Date   WBC 6.4 04/23/2022   HGB 16.2 04/23/2022   HCT 47.6 04/23/2022   PLT 209.0 04/23/2022   GLUCOSE 161 (H) 04/23/2022   CHOL 144 08/06/2021   TRIG 74.0 08/06/2021   HDL 37.50 (L) 08/06/2021   LDLCALC 92 08/06/2021   ALT 17 04/23/2022   AST 18 04/23/2022   NA 139 04/23/2022   K 4.0 04/23/2022   CL 98 04/23/2022   CREATININE 0.86 04/23/2022   BUN 15 04/23/2022   CO2 31 04/23/2022   TSH 2.82 04/23/2022   PSA 1.57 08/06/2021   INR 1.2 (H) 12/18/2021   HGBA1C 8.0 (H) 04/23/2022   MICROALBUR 3.3 (H) 08/06/2021

## 2022-06-26 ENCOUNTER — Other Ambulatory Visit: Payer: Self-pay | Admitting: Internal Medicine

## 2022-06-26 DIAGNOSIS — I739 Peripheral vascular disease, unspecified: Secondary | ICD-10-CM

## 2022-07-04 ENCOUNTER — Other Ambulatory Visit: Payer: Self-pay | Admitting: Internal Medicine

## 2022-07-04 DIAGNOSIS — I10 Essential (primary) hypertension: Secondary | ICD-10-CM

## 2022-07-10 ENCOUNTER — Ambulatory Visit: Payer: BC Managed Care – PPO | Admitting: Internal Medicine

## 2022-07-23 LAB — HM DIABETES EYE EXAM

## 2022-07-30 ENCOUNTER — Other Ambulatory Visit (INDEPENDENT_AMBULATORY_CARE_PROVIDER_SITE_OTHER): Payer: BC Managed Care – PPO

## 2022-07-30 DIAGNOSIS — E669 Obesity, unspecified: Secondary | ICD-10-CM

## 2022-07-30 DIAGNOSIS — E1169 Type 2 diabetes mellitus with other specified complication: Secondary | ICD-10-CM

## 2022-07-30 DIAGNOSIS — E039 Hypothyroidism, unspecified: Secondary | ICD-10-CM | POA: Diagnosis not present

## 2022-07-30 DIAGNOSIS — E230 Hypopituitarism: Secondary | ICD-10-CM | POA: Diagnosis not present

## 2022-07-30 LAB — CBC
HCT: 46.2 % (ref 39.0–52.0)
Hemoglobin: 15.8 g/dL (ref 13.0–17.0)
MCHC: 34.1 g/dL (ref 30.0–36.0)
MCV: 88.5 fl (ref 78.0–100.0)
Platelets: 177 10*3/uL (ref 150.0–400.0)
RBC: 5.22 Mil/uL (ref 4.22–5.81)
RDW: 13.6 % (ref 11.5–15.5)
WBC: 6.1 10*3/uL (ref 4.0–10.5)

## 2022-07-30 LAB — COMPREHENSIVE METABOLIC PANEL
ALT: 17 U/L (ref 0–53)
AST: 16 U/L (ref 0–37)
Albumin: 4.1 g/dL (ref 3.5–5.2)
Alkaline Phosphatase: 63 U/L (ref 39–117)
BUN: 14 mg/dL (ref 6–23)
CO2: 29 mEq/L (ref 19–32)
Calcium: 9.7 mg/dL (ref 8.4–10.5)
Chloride: 100 mEq/L (ref 96–112)
Creatinine, Ser: 0.9 mg/dL (ref 0.40–1.50)
GFR: 94.04 mL/min (ref >60.00)
Glucose, Bld: 252 mg/dL — ABNORMAL HIGH (ref 70–99)
Potassium: 4 mEq/L (ref 3.5–5.1)
Sodium: 138 mEq/L (ref 135–145)
Total Bilirubin: 1.1 mg/dL (ref 0.2–1.2)
Total Protein: 7.2 g/dL (ref 6.0–8.3)

## 2022-07-30 LAB — TESTOSTERONE: Testosterone: 511.47 ng/dL (ref 300.00–890.00)

## 2022-07-30 LAB — T4, FREE: Free T4: 1.13 ng/dL (ref 0.60–1.60)

## 2022-07-31 ENCOUNTER — Other Ambulatory Visit: Payer: Self-pay | Admitting: Internal Medicine

## 2022-07-31 DIAGNOSIS — E039 Hypothyroidism, unspecified: Secondary | ICD-10-CM

## 2022-07-31 LAB — FRUCTOSAMINE: Fructosamine: 464 umol/L — ABNORMAL HIGH (ref 0–285)

## 2022-08-01 ENCOUNTER — Ambulatory Visit: Payer: BC Managed Care – PPO | Admitting: Endocrinology

## 2022-08-01 ENCOUNTER — Encounter: Payer: Self-pay | Admitting: Internal Medicine

## 2022-08-01 ENCOUNTER — Encounter: Payer: Self-pay | Admitting: Endocrinology

## 2022-08-01 VITALS — BP 124/72 | HR 82 | Ht 72.0 in | Wt 341.4 lb

## 2022-08-01 DIAGNOSIS — E230 Hypopituitarism: Secondary | ICD-10-CM | POA: Diagnosis not present

## 2022-08-01 DIAGNOSIS — E1165 Type 2 diabetes mellitus with hyperglycemia: Secondary | ICD-10-CM

## 2022-08-01 MED ORDER — FREESTYLE LIBRE 3 SENSOR MISC: 1.0000 | 2 refills | Status: DC

## 2022-08-01 MED ORDER — SEMAGLUTIDE (1 MG/DOSE) 4 MG/3ML ~~LOC~~ SOPN: 1.0000 mg | PEN_INJECTOR | SUBCUTANEOUS | 2 refills | Status: DC

## 2022-08-01 MED ORDER — TRESIBA FLEXTOUCH 100 UNIT/ML ~~LOC~~ SOPN: 30.0000 [IU] | PEN_INJECTOR | Freq: Every day | SUBCUTANEOUS | 1 refills | Status: DC

## 2022-08-01 MED ORDER — CLOMIPHENE CITRATE 50 MG PO TABS: ORAL_TABLET | ORAL | 1 refills | Status: DC

## 2022-08-01 NOTE — Progress Notes (Signed)
Patient ID: Zachary Murray, male   DOB: Apr 02, 1966, 56 y.o.   MRN: [id number]              Chief complaint: Endocrinology follow-up  History of Present Illness  PROBLEM 1  Hypogonadism was first diagnosed in 11/18  Since about 2016 or so he has had complaints of fatigue He also feels malaise and listless, mostly in the afternoons and did not feel like doing anything at that time Also has some decreased libido  The diagnosis was confirmed by the low free T level of 6.6 done in 5/19 [facility] was inappropriately low at 6.3 indicating SECONDARY hypogonadism-  Initially tried on CLOMIPHENE 25 mg 3 times a week With this his energy level and libido were improving but only partially This is despite his testosterone level being as high as 661, his LH had gone up to 20  Because of the lack of adequate symptomatic improvement he is also taking AndroGel since 9/19  With testosterone gel he was feeling significantly better with his energy level overall His dose has been adjusted periodic However because of his hemoglobin going up to 18.3 in 03/2019 he was told to reduce the dose to 4 pumps daily He was taking 5 pumps a day on the AndroGel in November when he was seen but because of fatigue and testosterone level of only 212 he was tried on [place] but he could not continue this because of cost  RECENT HISTORY:  In 2021 while on AndroGel he had requested trying clomiphene because of fatigue and he felt it had helped his fatigue and libido Since his LH level had gone up to 19 AndroGel was added and clomiphene was reduced to twice a week Has been taking 2 pumps of ANDROGEL daily as prescribed regularly  He has been quite consistent with taking his clomiphene twice a week in the evenings, he thinks this is helping his libido Does not complain of any unusual fatigue except as related to his diabetes  His testosterone levels have been consistently normal  Previously his hemoglobin has been as high as  18 but is now consistently normal Has not been going to donate blood recently, previously was going every 2 months  Testosterone level history:  Lab Results  Component Value Date   TESTOSTERONE 511.47 07/30/2022   TESTOSTERONE 403.16 11/21/2021   TESTOSTERONE 428.49 03/06/2021   TESTOSTERONE 544.48 10/27/2020    Prolactin level: 5.8  Lab Results  Component Value Date   LH 19.08 (H) 06/07/2020   Lab Results  Component Value Date   HGB 15.8 07/30/2022    PROBLEM 2: DIABETES:  Diagnosis date: ?  2016  Previous history:  Although he was diagnosed to have diabetes in 2016 he was not able to tolerate metformin that was previously prescribed Also was tried on Kombiglyze in 2017 and 2018 with some improved control He was tried on Bydureon in 06/2017 and continued until mil 3/19 d He was started on [place] insulin in 01/2018 by his PCP when his A1c was 7.8 and fasting glucose 165 Insulin was stopped in 5/19 since his blood sugar was averaging only 92 on his freestyle sensor at home  Recent history:     His most recent A1c is 8% compared to 6.4 as of 6/23 This is his highest level  Current management: None, was on Ozempic 1 mg weekly  He had been taking Ozempic 1 mg regularly Because of affordability issues his PCP has started him on  Darcel Bayley, Trulicity and [place] in the last 3 months or so However he cannot afford any of his medications because of high co-pay For the last couple of months has not taken any medication and has not followed up with PCP .  Has checked his blood sugars inconsistently and only regularly in the last 3 days from his download review  These are significantly higher with highest reading 382  He does not think he has gone off his diet Asking about Crestor causing the diabetes but he started this last September He is generally not going off his diet and his weight is down slightly Has limited ability to exercise       Monitors blood  glucose:   One Touch Verio meter         Blood Glucose readings from download:  FASTING 225, 246 Afternoon and evening readings 232-382 with overall average 291  Side effects from medications:  Diarrhea from metformin         Meals:  Usually low-fat                    Dietician visit:   Never  Weight control:  Wt Readings from Last 3 Encounters:  08/01/22 (!) 341 lb 6.4 oz (154.9 kg)  04/23/22 (!) 344 lb (156 kg)  12/18/21 (!) 345 lb (156.5 kg)   Lab Results  Component Value Date   HGBA1C 8.0 (H) 04/23/2022   HGBA1C 6.4 (A) 11/23/2021   HGBA1C 7.0 (H) 08/06/2021   Lab Results  Component Value Date   MICROALBUR 3.3 (H) 08/06/2021   LDLCALC 92 08/06/2021   CREATININE 0.90 07/30/2022    HYPOTHYROIDISM:  He was previously given levothyroxine for high TSH in 12/16 but later stopped because of improved TSH  Because of his fatigue he had been started on levothyroxine in 3/19 with only slight improvement in his fatigue  He has been taking 75 mcg of levothyroxine and has been on the same dose since July 2019  TSH and free T4 consistently normal as below           Lab Results  Component Value Date   TSH 2.82 04/23/2022   TSH 2.16 08/06/2021   TSH 2.31 03/06/2021   FREET4 1.13 07/30/2022   FREET4 1.29 11/21/2021   FREET4 0.96 03/06/2021         Allergies as of 08/01/2022       Reactions   Metformin And Related Diarrhea   Wellbutrin [bupropion] Other (See Comments)   tremors   Mounjaro [tirzepatide] Rash   Lisinopril Cough        Medication List        Accurate as of August 01, 2022 12:52 PM. If you have any questions, ask your nurse or doctor.          STOP taking these medications    [place] 100-33 UNT-MCG/ML Sopn Generic drug: Insulin Glargine-Lixisenatide Stopped by: Elayne Snare, MD       TAKE these medications    ALPRAZolam 0.5 MG tablet Commonly known as: XANAX TAKE 1 TABLET(0.5 MG) BY MOUTH THREE TIMES DAILY AS NEEDED FOR  ANXIETY   cilostazol 100 MG tablet Commonly known as: PLETAL TAKE 1 TABLET(100 MG) BY MOUTH TWICE DAILY   clomiPHENE 50 MG tablet Commonly known as: CLOMID Half tablet at bedtime 2 days a week What changed: See the new instructions. Changed by: Elayne Snare, MD   FreeStyle Libre 2 Reader [facility] 1 Act by Does not apply route  daily.   FreeStyle Libre 3 Sensor Misc 1 Device by Does not apply route every 14 (fourteen) days. Apply 1 sensor on upper arm every 14 days for continuous glucose monitoring What changed:  how much to take when to take this additional instructions Changed by: Reather Littler, MD   glucose blood test strip Use onetouch verio flex test strip as instructed to check blood sugar once daily.   OneTouch Verio test strip Generic drug: glucose blood 1 each by Other route as needed for other. Use as instructed   levothyroxine 75 MCG tablet Commonly known as: SYNTHROID TAKE 1 TABLET(75 MCG) BY MOUTH DAILY BEFORE BREAKFAST   OneTouch Delica Plus Lancet33G Misc Use to check blood sugar once a day   OneTouch Verio Flex System w/Device Kit Use as instructed to check blood sugar once daily.   pantoprazole 40 MG tablet Commonly known as: PROTONIX Take 1 tablet (40 mg total) by mouth daily.   primidone 50 MG tablet Commonly known as: MYSOLINE Take 1 tablet (50 mg total) by mouth at bedtime.   propranolol 40 MG tablet Commonly known as: INDERAL Take 1 tablet (40 mg total) by mouth 3 (three) times daily.   ReliOn Pen Needles 31G X 6 MM Misc Generic drug: Insulin Pen Needle Inject 1 Act into the skin once a week. Use as needed for insulin injection.   rosuvastatin 10 MG tablet Commonly known as: CRESTOR Take 1 tablet (10 mg total) by mouth daily.   Testosterone 1.62 % Gel APPLY 1 PUMP ON EACH UPPER ARM EVERY DAY   Tresiba FlexTouch 100 UNIT/ML FlexTouch Pen Generic drug: insulin degludec Inject 30 Units into the skin daily. Started by: Reather Littler, MD    triamterene-hydrochlorothiazide 37.5-25 MG tablet Commonly known as: MAXZIDE-25 TAKE 1 TABLET BY MOUTH DAILY        Allergies:  Allergies  Allergen Reactions   Metformin And Related Diarrhea   Wellbutrin [Bupropion] Other (See Comments)    tremors   Mounjaro [Tirzepatide] Rash   Lisinopril Cough    Past Medical History:  Diagnosis Date   Hypertension    Obesity    OSA on CPAP    Type II diabetes mellitus with manifestations [facility])     Past Surgical History:  Procedure Laterality Date   BIOPSY  08/31/2018   Procedure: BIOPSY;  Surgeon: Napoleon Form, MD;  Location: [facility];  Service: Endoscopy;;   COLONOSCOPY WITH PROPOFOL N/A 11/12/2016   Procedure: COLONOSCOPY WITH PROPOFOL;  Surgeon: Napoleon Form, MD;  Location: [facility];  Service: Endoscopy;  Laterality: N/A;   COLONOSCOPY WITH PROPOFOL N/A 08/31/2018   Procedure: COLONOSCOPY WITH PROPOFOL;  Surgeon: Napoleon Form, MD;  Location: [facility];  Service: Endoscopy;  Laterality: N/A;   POLYPECTOMY  08/31/2018   Procedure: POLYPECTOMY;  Surgeon: Napoleon Form, MD;  Location: [facility];  Service: Endoscopy;;   WISDOM TOOTH EXTRACTION      Family History  Problem Relation Age of Onset   Colon cancer Father    Hypertension Father    Thyroid cancer Mother    Colon polyps Mother    Thyroid disease Mother    Thyroid disease Sister    Thyroid disease Maternal Grandmother    Alcohol abuse Neg Hx    COPD Neg Hx    Diabetes Neg Hx    Early death Neg Hx    Hearing loss Neg Hx    Heart disease Neg Hx    Hyperlipidemia Neg Hx  Kidney disease Neg Hx    Stroke Neg Hx    Esophageal cancer Neg Hx     Social History:  reports that he has never smoked. He has quit using smokeless tobacco.  His smokeless tobacco use included chew. He reports that he does not drink alcohol and does not use drugs.  Review of Systems    LIPIDS: LDL below 100, now on a statin drug as  prophylaxis Followed by PCP  Lab Results  Component Value Date   CHOL 144 08/06/2021   CHOL 121 12/27/2019   CHOL 127 12/28/2018   Lab Results  Component Value Date   HDL 37.50 (L) 08/06/2021   HDL 48.70 12/27/2019   HDL 38.00 (L) 12/28/2018   Lab Results  Component Value Date   LDLCALC 92 08/06/2021   LDLCALC 63 12/27/2019   LDLCALC 78 12/28/2018   Lab Results  Component Value Date   TRIG 74.0 08/06/2021   TRIG 48.0 12/27/2019   TRIG 55.0 12/28/2018   Lab Results  Component Value Date   CHOLHDL 4 08/06/2021   CHOLHDL 2 12/27/2019   CHOLHDL 3 12/28/2018   No results found for: "LDLDIRECT"  BP Readings from Last 3 Encounters:  08/01/22 124/72  04/23/22 118/76  12/18/21 118/76     General Examination:   BP 124/72   Pulse 82   Ht 6' (1.829 m)   Wt (!) 341 lb 6.4 oz (154.9 kg)   SpO2 94%   BMI 46.30 kg/m   Assessment/ Plan:   HYPOGONADOTROPIC HYPOGONADISM with symptoms of fatigue and low free testosterone at baseline  At baseline was symptomatic with fatigue and decreased libido  Currently on clomiphene 2 days a week with AndroGel 1 pump on each arm daily Testosterone levels have been consistently in the 400-500 range   He thinks his libido is better with clomiphene His symptoms of fatigue have resolved with treatment of other conditions also  No increase in hemoglobin, helped by periodically doing blood donation  DIABETES with morbid obesity  Blood sugars are out of control but not clear when his blood sugar started going up this year but the patient feels that they started going up in the middle of the year, A1c was 8% in June  Likely has had significant progression of his diabetes this year but has not monitored his blood sugars regularly Because of his blood sugars being as high as 382 he will benefit from at least basal insulin to treat glucose toxicity Also since he has generally done well with Ozempic previously we can try this again and  have given him a co-pay card to start back He will also for now start using a sample using 0.5 mg weekly before going to the 1 mg dose  Glucose monitoring needs to be restarted with freestyle libre and given him information on the freestyle libre sensor and how to start using this with the phone app, discussed that he should do better with the [place] 3 sensor and have less issues with the sensor falling off  Follow-up in 2 months Also Will benefit from consultation  with diabetes educator     Hypothyroidism: Adequately replaced as of 6/23  Statin therapy: Discussed that this is unlikely to be of significance with making his diabetes going out of control several months after starting the medication and he will discuss with his PCP, currently patient wants to stop this temporarily  Patient Instructions  Check blood sugars on waking up days a week  Also check blood sugars about 2 hours after meals and do this after different meals by rotation  Recommended blood sugar levels on waking up are 90-130 and about 2 hours after meal is 130-160  Please bring your blood sugar monitor to each visit, thank you  Ozempic 0.$RemoveBe'25mg'[id number]$  1x then 0.5 weekly for 4 x then $Remo'1mg'GzVTv$   Elayne Snare 08/01/2022, 12:52 PM      Note: This office note was prepared with Dragon voice recognition system technology. Any transcriptional errors that result from this process are unintentional.     Elayne Snare 08/01/2022, 12:52 PM

## 2022-08-01 NOTE — Patient Instructions (Addendum)
Check blood sugars on waking up days a week  Also check blood sugars about 2 hours after meals and do this after different meals by rotation  Recommended blood sugar levels on waking up are 90-130 and about 2 hours after meal is 130-160  Please bring your blood sugar monitor to each visit, thank you  Ozempic 0.'25mg'$  1x then 0.5 weekly for 4 x then '1mg'$ 

## 2022-08-02 DIAGNOSIS — H2513 Age-related nuclear cataract, bilateral: Secondary | ICD-10-CM | POA: Diagnosis not present

## 2022-08-02 DIAGNOSIS — H524 Presbyopia: Secondary | ICD-10-CM | POA: Diagnosis not present

## 2022-08-02 DIAGNOSIS — E119 Type 2 diabetes mellitus without complications: Secondary | ICD-10-CM | POA: Diagnosis not present

## 2022-08-02 DIAGNOSIS — H5203 Hypermetropia, bilateral: Secondary | ICD-10-CM | POA: Diagnosis not present

## 2022-08-06 ENCOUNTER — Encounter: Payer: Self-pay | Admitting: Endocrinology

## 2022-09-10 ENCOUNTER — Other Ambulatory Visit (INDEPENDENT_AMBULATORY_CARE_PROVIDER_SITE_OTHER): Payer: BC Managed Care – PPO

## 2022-09-10 DIAGNOSIS — E1165 Type 2 diabetes mellitus with hyperglycemia: Secondary | ICD-10-CM | POA: Diagnosis not present

## 2022-09-10 DIAGNOSIS — E230 Hypopituitarism: Secondary | ICD-10-CM | POA: Diagnosis not present

## 2022-09-10 LAB — CBC
HCT: 47.1 % (ref 39.0–52.0)
Hemoglobin: 15.9 g/dL (ref 13.0–17.0)
MCHC: 33.9 g/dL (ref 30.0–36.0)
MCV: 88.9 fl (ref 78.0–100.0)
Platelets: 196 10*3/uL (ref 150.0–400.0)
RBC: 5.3 Mil/uL (ref 4.22–5.81)
RDW: 13.5 % (ref 11.5–15.5)
WBC: 5.3 10*3/uL (ref 4.0–10.5)

## 2022-09-10 LAB — BASIC METABOLIC PANEL
BUN: 17 mg/dL (ref 6–23)
CO2: 32 mEq/L (ref 19–32)
Calcium: 9.8 mg/dL (ref 8.4–10.5)
Chloride: 104 mEq/L (ref 96–112)
Creatinine, Ser: 0.88 mg/dL (ref 0.40–1.50)
GFR: 94.6 mL/min (ref >60.00)
Glucose, Bld: 135 mg/dL — ABNORMAL HIGH (ref 70–99)
Potassium: 4.5 mEq/L (ref 3.5–5.1)
Sodium: 141 mEq/L (ref 135–145)

## 2022-09-10 LAB — HEMOGLOBIN A1C: Hgb A1c MFr Bld: 8.8 % — ABNORMAL HIGH (ref 4.6–6.5)

## 2022-09-10 LAB — TESTOSTERONE: Testosterone: 492.65 ng/dL (ref 300.00–890.00)

## 2022-09-11 NOTE — Progress Notes (Signed)
Patient ID: Zachary Murray, male   DOB: [birth year], 56 y.o.   MRN: [id number]              Chief complaint: Endocrinology follow-up  History of Present Illness  PROBLEM 1  Hypogonadism was first diagnosed in 11/18  Since about 2016 or so he has had complaints of fatigue He also feels malaise and listless, mostly in the afternoons and did not feel like doing anything at that time Also has some decreased libido  The diagnosis was confirmed by the low free T level of 6.6 done in 5/19 [facility] was inappropriately low at 6.3 indicating SECONDARY hypogonadism-  Initially tried on CLOMIPHENE 25 mg 3 times a week With this his energy level and libido were improving but only partially This is despite his testosterone level being as high as 661, his LH had gone up to 20  Because of the lack of adequate symptomatic improvement he is also taking AndroGel since 9/19  With testosterone gel he was feeling significantly better with his energy level overall However because of his hemoglobin going up to 18.3 in 03/2019 he was told to reduce the dose to 4 pumps daily He was  tried on [place] but he could not continue this because of cost  RECENT HISTORY:  In 2021 while on AndroGel he had requested trying clomiphene because of fatigue and he felt it had helped his fatigue and libido Since his LH level had gone up to 19 AndroGel was added and clomiphene was reduced to twice a week Has been taking 2 pumps of ANDROGEL daily as prescribed regularly  He has been quite consistent with taking his clomiphene twice a week in the evenings, he thinks this is helping his libido Does not complain of any unusual fatigue except as related to his diabetes  His testosterone levels have been consistently normal  Previously his hemoglobin has been as high as 18 but is now staying normal Periodically donates blood  Testosterone level history:  Lab Results  Component Value Date   TESTOSTERONE 492.65 09/10/2022    TESTOSTERONE 511.47 07/30/2022   TESTOSTERONE 403.16 11/21/2021   TESTOSTERONE 428.49 03/06/2021    Prolactin level: 5.8  Lab Results  Component Value Date   LH 19.08 (H) 06/07/2020   Lab Results  Component Value Date   HGB 15.9 09/10/2022    PROBLEM 2: DIABETES:  Diagnosis date: ?  2016  Previous history:  Although he was diagnosed to have diabetes in 2016 he was not able to tolerate metformin that was previously prescribed Also was tried on [place] in 2017 and 2018 with some improved control He was tried on Bydureon in 06/2017 and continued until mil 3/19 d He was started on Ozempic and Tresiba insulin in 01/2018 by his PCP when his A1c was 7.8 and fasting glucose 165 Insulin was stopped in 5/19 since his blood sugar was averaging only 92 on his freestyle sensor at home  Recent history:     His most recent A1c is 8.8 %, was 8% This is his highest level  Current management: TRESIBA 30 units daily  He had been taking Ozempic 1 mg regularly but because of high out-of-pocket expense he stopped this a couple of months ago He is only taking [place] once in the evening around dinnertime and has continued with 30 units Although overall his blood sugars are only mildly increased and may be periodically higher overnight and after some meals His GMI is only 6.9 and unable to  explain his high A1c However he says that occasionally he has libre readings as much is 50 mg lower than the actual fingersticks but lab glucose was only slightly higher than the actual reading on the [place] He has been able to start the freestyle libre system and able to afford this He has had some variability in his diet and also recently not exercising regularly, only occasional physical activities He has gained a little weight recently  Lab glucose 135 fasting       Monitors blood glucose:   One Touch Verio meter         Freestyle libre shows the following interpretation for the last 2 weeks  download  Blood sugars are generally modestly increased between about 8 PM and 5 AM and generally lower during the day OVERNIGHT blood sugars show some variability with some readings above 180 and occasionally may be even low normal early morning, no hypoglycemia POSTPRANDIAL readings show only occasional spikes after one of his meals but not consistently, may be occasionally higher late in the evenings going into the early part of the night Premeal blood sugars are around 130-140 No hypoglycemia  CGM use % of time   2-week average/GV 148  Time in range       82 %  % Time Above 180 18  % Time above 250   % Time Below 70      PRE-MEAL Fasting Lunch Dinner Bedtime Overall  Glucose range:       Averages: 132    148   POST-MEAL PC Breakfast PC Lunch PC Dinner  Glucose range:     Averages: 141  162   Previous glucose data: FASTING 225, 246 Afternoon and evening readings 232-382 with overall average 291  Side effects from medications:  Diarrhea from metformin         Meals:  Usually low-fat                    Dietician visit:   Never  Weight control:  Wt Readings from Last 3 Encounters:  09/12/22 (!) 346 lb 6.4 oz (157.1 kg)  08/01/22 (!) 341 lb 6.4 oz (154.9 kg)  04/23/22 (!) 344 lb (156 kg)   Lab Results  Component Value Date   HGBA1C 8.8 (H) 09/10/2022   HGBA1C 8.0 (H) 04/23/2022   HGBA1C 6.4 (A) 11/23/2021   Lab Results  Component Value Date   MICROALBUR 3.3 (H) 08/06/2021   LDLCALC 92 08/06/2021   CREATININE 0.88 09/10/2022    HYPOTHYROIDISM:  He was previously given levothyroxine for high TSH in 12/16 but later stopped because of improved TSH  Because of his fatigue he had been started on levothyroxine in 3/19 with only slight improvement in his fatigue  He has been taking 75 mcg of levothyroxine and has been on the same dose since July 2019  TSH and free T4 consistently normal as below           Lab Results  Component Value Date   TSH 2.82  04/23/2022   TSH 2.16 08/06/2021   TSH 2.31 03/06/2021   FREET4 1.13 07/30/2022   FREET4 1.29 11/21/2021   FREET4 0.96 03/06/2021         Allergies as of 09/12/2022       Reactions   Metformin And Related Diarrhea   Wellbutrin [bupropion] Other (See Comments)   tremors   Mounjaro [tirzepatide] Rash   Lisinopril Cough        Medication  List        Accurate as of September 12, 2022  8:35 PM. If you have any questions, ask your nurse or doctor.          ALPRAZolam 0.5 MG tablet Commonly known as: XANAX TAKE 1 TABLET(0.5 MG) BY MOUTH THREE TIMES DAILY AS NEEDED FOR ANXIETY   cilostazol 100 MG tablet Commonly known as: PLETAL TAKE 1 TABLET(100 MG) BY MOUTH TWICE DAILY   clomiPHENE 50 MG tablet Commonly known as: CLOMID Half tablet at bedtime 2 days a week   dapagliflozin propanediol 10 MG Tabs tablet Commonly known as: Farxiga Take 1 tablet (10 mg total) by mouth daily before breakfast. Started by: Reather Littler, MD   FreeStyle Libre 2 Reader [facility] 1 Act by Does not apply route daily.   FreeStyle Libre 3 Sensor Misc 1 Device by Does not apply route every 14 (fourteen) days. Apply 1 sensor on upper arm every 14 days for continuous glucose monitoring   glucose blood test strip Use onetouch verio flex test strip as instructed to check blood sugar once daily.   OneTouch Verio test strip Generic drug: glucose blood 1 each by Other route as needed for other. Use as instructed   levothyroxine 75 MCG tablet Commonly known as: SYNTHROID TAKE 1 TABLET(75 MCG) BY MOUTH DAILY BEFORE BREAKFAST   OneTouch Delica Plus Lancet33G Misc Use to check blood sugar once a day   OneTouch Verio Flex System w/Device Kit Use as instructed to check blood sugar once daily.   pantoprazole 40 MG tablet Commonly known as: PROTONIX Take 1 tablet (40 mg total) by mouth daily.   primidone 50 MG tablet Commonly known as: MYSOLINE Take 1 tablet (50 mg total) by mouth at bedtime.    propranolol 40 MG tablet Commonly known as: INDERAL Take 1 tablet (40 mg total) by mouth 3 (three) times daily.   ReliOn Pen Needles 31G X 6 MM Misc Generic drug: Insulin Pen Needle Inject 1 Act into the skin once a week. Use as needed for insulin injection.   rosuvastatin 10 MG tablet Commonly known as: CRESTOR Take 1 tablet (10 mg total) by mouth daily.   Semaglutide (1 MG/DOSE) 4 MG/3ML Sopn Inject 1 mg as directed once a week.   Testosterone 1.62 % Gel APPLY 1 PUMP ON EACH UPPER ARM EVERY DAY   Tresiba FlexTouch 100 UNIT/ML FlexTouch Pen Generic drug: insulin degludec Inject 30 Units into the skin daily.   triamterene-hydrochlorothiazide 37.5-25 MG tablet Commonly known as: MAXZIDE-25 TAKE 1 TABLET BY MOUTH DAILY        Allergies:  Allergies  Allergen Reactions   Metformin And Related Diarrhea   Wellbutrin [Bupropion] Other (See Comments)    tremors   Mounjaro [Tirzepatide] Rash   Lisinopril Cough    Past Medical History:  Diagnosis Date   Hypertension    Obesity    OSA on CPAP    Type II diabetes mellitus with manifestations [facility])     Past Surgical History:  Procedure Laterality Date   BIOPSY  08/31/2018   Procedure: BIOPSY;  Surgeon: Napoleon Form, MD;  Location: [facility];  Service: Endoscopy;;   COLONOSCOPY WITH PROPOFOL N/A 11/12/2016   Procedure: COLONOSCOPY WITH PROPOFOL;  Surgeon: Napoleon Form, MD;  Location: [facility];  Service: Endoscopy;  Laterality: N/A;   COLONOSCOPY WITH PROPOFOL N/A 08/31/2018   Procedure: COLONOSCOPY WITH PROPOFOL;  Surgeon: Napoleon Form, MD;  Location: [facility];  Service: Endoscopy;  Laterality: N/A;  POLYPECTOMY  08/31/2018   Procedure: POLYPECTOMY;  Surgeon: Mauri Pole, MD;  Location: [facility];  Service: Endoscopy;;   WISDOM TOOTH EXTRACTION      Family History  Problem Relation Age of Onset   Colon cancer Father    Hypertension Father    Thyroid cancer Mother     Colon polyps Mother    Thyroid disease Mother    Thyroid disease Sister    Thyroid disease Maternal Grandmother    Alcohol abuse Neg Hx    COPD Neg Hx    Diabetes Neg Hx    Early death Neg Hx    Hearing loss Neg Hx    Heart disease Neg Hx    Hyperlipidemia Neg Hx    Kidney disease Neg Hx    Stroke Neg Hx    Esophageal cancer Neg Hx     Social History:  reports that he has never smoked. He has quit using smokeless tobacco.  His smokeless tobacco use included chew. He reports that he does not drink alcohol and does not use drugs.  Review of Systems    LIPIDS: LDL previously below 100, currently not on a statin drug  He felt that Crestor made his diabetes worse, not clear if he had any side effects from Lipitor in 2019  Followed by PCP  Lab Results  Component Value Date   CHOL 144 08/06/2021   CHOL 121 12/27/2019   CHOL 127 12/28/2018   Lab Results  Component Value Date   HDL 37.50 (L) 08/06/2021   HDL 48.70 12/27/2019   HDL 38.00 (L) 12/28/2018   Lab Results  Component Value Date   LDLCALC 92 08/06/2021   LDLCALC 63 12/27/2019   LDLCALC 78 12/28/2018   Lab Results  Component Value Date   TRIG 74.0 08/06/2021   TRIG 48.0 12/27/2019   TRIG 55.0 12/28/2018   Lab Results  Component Value Date   CHOLHDL 4 08/06/2021   CHOLHDL 2 12/27/2019   CHOLHDL 3 12/28/2018   No results found for: "LDLDIRECT"  BP Readings from Last 3 Encounters:  09/12/22 124/80  08/01/22 124/72  04/23/22 118/76     General Examination:   BP 124/80   Pulse 75   Ht 6' (1.829 m)   Wt (!) 346 lb 6.4 oz (157.1 kg)   SpO2 95%   BMI 46.98 kg/m   Assessment/ Plan: 10/23   HYPOGONADOTROPIC HYPOGONADISM with symptoms of fatigue and low free testosterone at baseline  At baseline was symptomatic with fatigue and decreased libido  Currently on clomiphene 2 days a week with AndroGel 1 pump on each arm daily Testosterone level is again excellent at 493 Previously also in the 400-500  range   No increase in hemoglobin again  DIABETES with morbid obesity  Currently only on basal insulin as he cannot afford Ozempic  Blood sugars are fair but A1c unusually high at 8.8 despite his GMI of 6.9 on the freestyle libre sensor  Also his freestyle [place] tends to be lower than the actual readings frequently He will be given a trial of Farxiga as he cannot afford Ozempic Discussed action of SGLT 2 drugs on lowering glucose by decreasing kidney absorption of glucose, benefits of weight loss and lower blood pressure, possible side effects including candidiasis and dosage regimen  He can use 10 mg tablet and do half tablet for now Also may consider restarting Ozempic next year if better covered  Hypothyroidism: Will need follow-up labs  Statin therapy: We  will consider Lipitor early next visit and recheck labs   Recheck microalbumin   Patient Instructions  Check on Dexcom G7 sensor  Kynnedi Zweig 09/12/2022, 8:35 PM      Note: This office note was prepared with Dragon voice recognition system technology. Any transcriptional errors that result from this process are unintentional.     Elayne Snare 09/12/2022, 8:35 PM

## 2022-09-12 ENCOUNTER — Ambulatory Visit: Payer: BC Managed Care – PPO | Admitting: Endocrinology

## 2022-09-12 ENCOUNTER — Encounter: Payer: Self-pay | Admitting: Endocrinology

## 2022-09-12 VITALS — BP 124/80 | HR 75 | Ht 72.0 in | Wt 346.4 lb

## 2022-09-12 DIAGNOSIS — Z794 Long term (current) use of insulin: Secondary | ICD-10-CM

## 2022-09-12 DIAGNOSIS — E1165 Type 2 diabetes mellitus with hyperglycemia: Secondary | ICD-10-CM | POA: Diagnosis not present

## 2022-09-12 DIAGNOSIS — E230 Hypopituitarism: Secondary | ICD-10-CM | POA: Diagnosis not present

## 2022-09-12 MED ORDER — DAPAGLIFLOZIN PROPANEDIOL 10 MG PO TABS: 10.0000 mg | ORAL_TABLET | Freq: Every day | ORAL | 2 refills | Status: DC

## 2022-09-12 NOTE — Patient Instructions (Signed)
Check on Dexcom G7 sensor

## 2022-09-17 ENCOUNTER — Encounter: Payer: Self-pay | Admitting: Endocrinology

## 2022-10-07 ENCOUNTER — Encounter: Payer: Self-pay | Admitting: Endocrinology

## 2022-10-07 ENCOUNTER — Other Ambulatory Visit: Payer: Self-pay

## 2022-10-07 DIAGNOSIS — E118 Type 2 diabetes mellitus with unspecified complications: Secondary | ICD-10-CM

## 2022-10-14 MED ORDER — RELION PEN NEEDLES 31G X 6 MM MISC: 1.0000 | 2 refills | Status: DC

## 2022-10-18 ENCOUNTER — Other Ambulatory Visit: Payer: Self-pay | Admitting: Internal Medicine

## 2022-10-18 ENCOUNTER — Encounter: Payer: Self-pay | Admitting: Endocrinology

## 2022-10-18 DIAGNOSIS — I10 Essential (primary) hypertension: Secondary | ICD-10-CM

## 2022-10-18 DIAGNOSIS — G250 Essential tremor: Secondary | ICD-10-CM

## 2022-10-18 DIAGNOSIS — I739 Peripheral vascular disease, unspecified: Secondary | ICD-10-CM

## 2022-10-21 ENCOUNTER — Telehealth: Payer: Self-pay

## 2022-10-21 ENCOUNTER — Other Ambulatory Visit: Payer: Self-pay | Admitting: Endocrinology

## 2022-10-21 MED ORDER — TESTOSTERONE 1.62 % TD GEL: TRANSDERMAL | 5 refills | Status: DC

## 2022-10-21 NOTE — Telephone Encounter (Signed)
Patient needs refill on testosterone. Please refill.

## 2022-10-22 ENCOUNTER — Encounter: Payer: Self-pay | Admitting: Endocrinology

## 2022-10-22 ENCOUNTER — Ambulatory Visit (INDEPENDENT_AMBULATORY_CARE_PROVIDER_SITE_OTHER): Payer: Medicaid Other | Admitting: Emergency Medicine

## 2022-10-22 ENCOUNTER — Other Ambulatory Visit (HOSPITAL_COMMUNITY): Payer: Self-pay

## 2022-10-22 ENCOUNTER — Telehealth: Payer: Self-pay

## 2022-10-22 ENCOUNTER — Encounter: Payer: Self-pay | Admitting: Emergency Medicine

## 2022-10-22 VITALS — BP 110/68 | HR 72 | Temp 98.2°F | Ht 72.0 in | Wt 343.1 lb

## 2022-10-22 DIAGNOSIS — Z794 Long term (current) use of insulin: Secondary | ICD-10-CM

## 2022-10-22 DIAGNOSIS — L989 Disorder of the skin and subcutaneous tissue, unspecified: Secondary | ICD-10-CM | POA: Diagnosis not present

## 2022-10-22 DIAGNOSIS — G250 Essential tremor: Secondary | ICD-10-CM | POA: Diagnosis not present

## 2022-10-22 DIAGNOSIS — I10 Essential (primary) hypertension: Secondary | ICD-10-CM

## 2022-10-22 DIAGNOSIS — E119 Type 2 diabetes mellitus without complications: Secondary | ICD-10-CM

## 2022-10-22 DIAGNOSIS — I739 Peripheral vascular disease, unspecified: Secondary | ICD-10-CM

## 2022-10-22 MED ORDER — PROPRANOLOL HCL 40 MG PO TABS: 40.0000 mg | ORAL_TABLET | Freq: Three times a day (TID) | ORAL | 1 refills | Status: DC

## 2022-10-22 MED ORDER — PRIMIDONE 50 MG PO TABS: 50.0000 mg | ORAL_TABLET | Freq: Every day | ORAL | 3 refills | Status: DC

## 2022-10-22 MED ORDER — TRIAMTERENE-HCTZ 37.5-25 MG PO TABS: 1.0000 | ORAL_TABLET | Freq: Every day | ORAL | 1 refills | Status: DC

## 2022-10-22 MED ORDER — CILOSTAZOL 100 MG PO TABS: 100.0000 mg | ORAL_TABLET | Freq: Two times a day (BID) | ORAL | 3 refills | Status: DC

## 2022-10-22 NOTE — Telephone Encounter (Signed)
Pharmacy Patient Advocate Encounter  Prior Authorization for FARXIGA 10 MG TAB has been approved.    PA#  [id number] Effective dates: 10/22/22 through 10/22/23  Received notification from [place] that prior authorization for FARXIGA 10 MG TAB is needed.    PA submitted on 10/22/22 [place], [place] Patient Advocate Specialist Direct Number: [phone] Fax: [phone]

## 2022-10-22 NOTE — Telephone Encounter (Signed)
Pharmacy Patient Advocate Encounter  Prior Authorization for OZEMPIC 1 MG DOSE PEN has been approved.    PA# [id number] Effective dates: 10/22/22 through 10/22/23   Received notification from [facility] that prior authorization for [facility] '1MG'$  PEN is needed.    PA submitted on 10/22/22  [place], [place] Patient Advocate Specialist Direct Number: [phone] Fax: [phone]

## 2022-10-22 NOTE — Patient Instructions (Signed)
Health Maintenance, Male Adopting a healthy lifestyle and getting preventive care are important in promoting health and wellness. Ask your health care provider about: The right schedule for you to have regular tests and exams. Things you can do on your own to prevent diseases and keep yourself healthy. What should I know about diet, weight, and exercise? Eat a healthy diet  Eat a diet that includes plenty of vegetables, fruits, low-fat dairy products, and lean protein. Do not eat a lot of foods that are high in solid fats, added sugars, or sodium. Maintain a healthy weight Body mass index (BMI) is a measurement that can be used to identify possible weight problems. It estimates body fat based on height and weight. Your health care provider can help determine your BMI and help you achieve or maintain a healthy weight. Get regular exercise Get regular exercise. This is one of the most important things you can do for your health. Most adults should: Exercise for at least 150 minutes each week. The exercise should increase your heart rate and make you sweat (moderate-intensity exercise). Do strengthening exercises at least twice a week. This is in addition to the moderate-intensity exercise. Spend less time sitting. Even light physical activity can be beneficial. Watch cholesterol and blood lipids Have your blood tested for lipids and cholesterol at 58 years of age, then have this test every 5 years. You may need to have your cholesterol levels checked more often if: Your lipid or cholesterol levels are high. You are older than 58 years of age. You are at high risk for heart disease. What should I know about cancer screening? Many types of cancers can be detected early and may often be prevented. Depending on your health history and family history, you may need to have cancer screening at various ages. This may include screening for: Colorectal cancer. Prostate cancer. Skin cancer. Lung  cancer. What should I know about heart disease, diabetes, and high blood pressure? Blood pressure and heart disease High blood pressure causes heart disease and increases the risk of stroke. This is more likely to develop in people who have high blood pressure readings or are overweight. Talk with your health care provider about your target blood pressure readings. Have your blood pressure checked: Every 3-5 years if you are 18-39 years of age. Every year if you are 40 years old or older. If you are between the ages of 65 and 75 and are a current or former smoker, ask your health care provider if you should have a one-time screening for abdominal aortic aneurysm (AAA). Diabetes Have regular diabetes screenings. This checks your fasting blood sugar level. Have the screening done: Once every three years after age 45 if you are at a normal weight and have a low risk for diabetes. More often and at a younger age if you are overweight or have a high risk for diabetes. What should I know about preventing infection? Hepatitis B If you have a higher risk for hepatitis B, you should be screened for this virus. Talk with your health care provider to find out if you are at risk for hepatitis B infection. Hepatitis C Blood testing is recommended for: Everyone born from 1945 through 1965. Anyone with known risk factors for hepatitis C. Sexually transmitted infections (STIs) You should be screened each year for STIs, including gonorrhea and chlamydia, if: You are sexually active and are younger than 58 years of age. You are older than 58 years of age and your   health care provider tells you that you are at risk for this type of infection. Your sexual activity has changed since you were last screened, and you are at increased risk for chlamydia or gonorrhea. Ask your health care provider if you are at risk. Ask your health care provider about whether you are at high risk for HIV. Your health care provider  may recommend a prescription medicine to help prevent HIV infection. If you choose to take medicine to prevent HIV, you should first get tested for HIV. You should then be tested every 3 months for as long as you are taking the medicine. Follow these instructions at home: Alcohol use Do not drink alcohol if your health care provider tells you not to drink. If you drink alcohol: Limit how much you have to 0-2 drinks a day. Know how much alcohol is in your drink. In the [place] one drink equals one 12 oz bottle of beer (355 mL), one 5 oz glass of wine (148 mL), or one 1 oz glass of hard liquor (44 mL). Lifestyle Do not use any products that contain nicotine or tobacco. These products include cigarettes, chewing tobacco, and vaping devices, such as e-cigarettes. If you need help quitting, ask your health care provider. Do not use street drugs. Do not share needles. Ask your health care provider for help if you need support or information about quitting drugs. General instructions Schedule regular health, dental, and eye exams. Stay current with your vaccines. Tell your health care provider if: You often feel depressed. You have ever been abused or do not feel safe at home. Summary Adopting a healthy lifestyle and getting preventive care are important in promoting health and wellness. Follow your health care provider's instructions about healthy diet, exercising, and getting tested or screened for diseases. Follow your health care provider's instructions on monitoring your cholesterol and blood pressure. This information is not intended to replace advice given to you by your health care provider. Make sure you discuss any questions you have with your health care provider. Document Revised: 03/26/2021 Document Reviewed: 03/26/2021 Elsevier Patient Education  2023 [employer]  

## 2022-10-22 NOTE — Progress Notes (Signed)
Zachary Murray 56 y.o.   Chief Complaint  Patient presents with   Follow-up    Medication management,     HISTORY OF PRESENT ILLNESS: This is a 56 y.o. male with multiple chronic medical problems here for follow-up and medication refill. Also requesting referral to Dr. Dwyane Dee, endocrinologist and also dermatology referral for multiple skin lesions. No other complaints or medical concerns today.  HPI   Prior to Admission medications   Medication Sig Start Date End Date Taking? Authorizing Provider  Semaglutide, 1 MG/DOSE, 4 MG/3ML SOPN Inject 1 mg as directed once a week. Patient not taking: Reported on 09/12/2022 08/01/22   Elayne Snare, MD  ALPRAZolam Duanne Moron) 0.5 MG tablet TAKE 1 TABLET(0.5 MG) BY MOUTH THREE TIMES DAILY AS NEEDED FOR ANXIETY 04/23/22   [name], MD  Blood Glucose Monitoring Suppl ([place]) w/Device KIT Use as instructed to check blood sugar once daily. 04/01/19   Elayne Snare, MD  cilostazol (PLETAL) 100 MG tablet TAKE 1 TABLET(100 MG) BY MOUTH TWICE DAILY 06/26/22   [name], MD  clomiPHENE (CLOMID) 50 MG tablet Half tablet at bedtime 2 days a week 08/01/22   Elayne Snare, MD  Continuous Blood Gluc Sensor (FREESTYLE LIBRE 3 SENSOR) MISC 1 Device by Does not apply route every 14 (fourteen) days. Apply 1 sensor on upper arm every 14 days for continuous glucose monitoring 08/01/22   Elayne Snare, MD  dapagliflozin propanediol (FARXIGA) 10 MG TABS tablet Take 1 tablet (10 mg total) by mouth daily before breakfast. 09/12/22   Elayne Snare, MD  glucose blood (ONETOUCH VERIO) test strip 1 each by Other route as needed for other. Use as instructed 05/07/22   Elayne Snare, MD  glucose blood test strip Use onetouch verio flex test strip as instructed to check blood sugar once daily. 04/01/19   Elayne Snare, MD  insulin degludec (TRESIBA FLEXTOUCH) 100 UNIT/ML FlexTouch Pen Inject 30 Units into the skin daily. 08/01/22   Elayne Snare, MD  Insulin Pen Needle (RELION  PEN NEEDLES) 31G X 6 MM MISC Inject 1 Act into the skin once a week. Use as needed for insulin injection. 10/14/22   Elayne Snare, MD  Lancets [facility] DELICA PLUS [id number]) MISC Use to check blood sugar once a day 05/07/22   Elayne Snare, MD  levothyroxine (SYNTHROID) 75 MCG tablet TAKE 1 TABLET(75 MCG) BY MOUTH DAILY BEFORE BREAKFAST 07/31/22   [name], MD  primidone (MYSOLINE) 50 MG tablet Take 1 tablet (50 mg total) by mouth at bedtime. 04/23/22   [name], MD  propranolol (INDERAL) 40 MG tablet Take 1 tablet (40 mg total) by mouth 3 (three) times daily. 04/23/22   [name], MD  Testosterone 1.62 % GEL Apply 1 pump on each upper arm daily 10/21/22   Elayne Snare, MD  triamterene-hydrochlorothiazide [facility]) 37.5-25 MG tablet TAKE 1 TABLET BY MOUTH DAILY 07/04/22   [name], MD    Allergies  Allergen Reactions   Metformin And Related Diarrhea   Wellbutrin [Bupropion] Other (See Comments)    tremors   Mounjaro [Tirzepatide] Rash   Lisinopril Cough    Patient Active Problem List   Diagnosis Date Noted   Insulin-requiring or dependent type II diabetes mellitus ([place]) 06/05/2022   Irritable bowel syndrome with diarrhea 12/19/2021   Degenerative arthritis of left knee 11/21/2021   Peripheral vascular disease ([place]) 05/06/2019   Tremor, essential 07/27/2018   Vitamin D deficiency 02/11/2018   Hypogonadism  male 02/11/2018   GAD (generalized anxiety disorder) 10/22/2017   Meralgia paresthetica, left 10/02/2017   Routine general medical examination at a health care facility 03/26/2017   Severe episode of recurrent major depressive disorder, without psychotic features ([place]) 03/26/2017   Lumbar radiculopathy 07/02/2016   Type II diabetes mellitus with manifestations ([place]) 10/26/2015   Hypothyroidism 12/08/2014   Hyperlipidemia with target LDL less than 130 11/19/2013   OSA (obstructive sleep apnea) 11/19/2013   Severe obesity (BMI >= 40) ([place]) 09/15/2012   Essential  hypertension, benign 09/15/2012    Past Medical History:  Diagnosis Date   Hypertension    Obesity    OSA on CPAP    Type II diabetes mellitus with manifestations [facility])     Past Surgical History:  Procedure Laterality Date   BIOPSY  08/31/2018   Procedure: BIOPSY;  Surgeon: Mauri Pole, MD;  Location: [facility];  Service: Endoscopy;;   COLONOSCOPY WITH PROPOFOL N/A 11/12/2016   Procedure: COLONOSCOPY WITH PROPOFOL;  Surgeon: Mauri Pole, MD;  Location: [facility];  Service: Endoscopy;  Laterality: N/A;   COLONOSCOPY WITH PROPOFOL N/A 08/31/2018   Procedure: COLONOSCOPY WITH PROPOFOL;  Surgeon: Mauri Pole, MD;  Location: [facility];  Service: Endoscopy;  Laterality: N/A;   POLYPECTOMY  08/31/2018   Procedure: POLYPECTOMY;  Surgeon: Mauri Pole, MD;  Location: [facility];  Service: Endoscopy;;   WISDOM TOOTH EXTRACTION      Social History   Socioeconomic History   Marital status: Single    Spouse name: Not on file   Number of children: 0   Years of education: Not on file   Highest education level: Some college, no degree  Occupational History   Occupation: unemployed  Tobacco Use   Smoking status: Never   Smokeless tobacco: Former    Types: Nurse, children's Use: Never used  Substance and Sexual Activity   Alcohol use: No    Alcohol/week: 0.0 standard drinks of alcohol    Comment: hx of alcohol abuse - quit 2017   Drug use: No   Sexual activity: Not Currently  Other Topics Concern   Not on file  Social History Narrative   Not on file   Social Determinants of Health   Financial Resource Strain: Not on file  Food Insecurity: Not on file  Transportation Needs: Not on file  Physical Activity: Not on file  Stress: Not on file  Social Connections: Not on file  Intimate Partner Violence: Not on file    Family History  Problem Relation Age of Onset   Colon cancer Father    Hypertension Father    Thyroid cancer  Mother    Colon polyps Mother    Thyroid disease Mother    Thyroid disease Sister    Thyroid disease Maternal Grandmother    Alcohol abuse Neg Hx    COPD Neg Hx    Diabetes Neg Hx    Early death Neg Hx    Hearing loss Neg Hx    Heart disease Neg Hx    Hyperlipidemia Neg Hx    Kidney disease Neg Hx    Stroke Neg Hx    Esophageal cancer Neg Hx      Review of Systems  Constitutional: Negative.  Negative for chills and fever.  HENT: Negative.  Negative for congestion and sore throat.   Respiratory: Negative.  Negative for cough and shortness of breath.   Cardiovascular: Negative.  Negative for chest pain  and palpitations.  Gastrointestinal:  Negative for abdominal pain, diarrhea, nausea and vomiting.  Neurological: Negative.  Negative for dizziness and headaches.       Chronic tremors  All other systems reviewed and are negative.   Today's Vitals   10/22/22 1329  BP: 110/68  Pulse: 72  Temp: 98.2 F (36.8 C)  TempSrc: Oral  SpO2: 95%  Weight: (!) 343 lb 2 oz (155.6 kg)  Height: 6' (1.829 m)   Body mass index is 46.54 kg/m.  Physical Exam Vitals reviewed.  Constitutional:      Appearance: Normal appearance.  HENT:     Head: Normocephalic.     Mouth/Throat:     Mouth: Mucous membranes are moist.     Pharynx: Oropharynx is clear.  Eyes:     Extraocular Movements: Extraocular movements intact.     Pupils: Pupils are equal, round, and reactive to light.  Cardiovascular:     Rate and Rhythm: Normal rate and regular rhythm.     Pulses: Normal pulses.     Heart sounds: Normal heart sounds.  Pulmonary:     Effort: Pulmonary effort is normal.     Breath sounds: Normal breath sounds.  Skin:    General: Skin is warm and dry.  Neurological:     General: No focal deficit present.     Mental Status: He is alert and oriented to person, place, and time.  Psychiatric:        Mood and Affect: Mood normal.        Behavior: Behavior normal.      ASSESSMENT & PLAN: A  total of 33 minutes was spent with the patient and counseling/coordination of care regarding preparing for this visit, review of most recent office visit notes, review of multiple chronic medical conditions and their management, review of all medications, review of most recent blood work results, prognosis, documentation, and need for follow-up.  Problem List Items Addressed This Visit       Cardiovascular and Mediastinum   Essential hypertension, benign - Primary    Well-controlled.  Continue Maxide 37.5-25 mg tablets daily BP Readings from Last 3 Encounters:  10/22/22 110/68  09/12/22 124/80  08/01/22 124/72  Also taking propranolol for essential tremors       Relevant Medications   propranolol (INDERAL) 40 MG tablet   triamterene-hydrochlorothiazide (MAXZIDE-25) 37.5-25 MG tablet   Peripheral vascular disease (HCC)    Stable.  On cilostazol 100 mg twice a day.      Relevant Medications   propranolol (INDERAL) 40 MG tablet   triamterene-hydrochlorothiazide (MAXZIDE-25) 37.5-25 MG tablet   cilostazol (PLETAL) 100 MG tablet     Endocrine   Insulin-requiring or dependent type II diabetes mellitus (HCC)    Stable.  Sees Dr. Dwyane Dee, endocrinologist for this. Insulin-dependent.  Also on Farxiga and semaglutide      Relevant Orders   Ambulatory referral to Endocrinology     Nervous and Auditory   Tremor, essential    Fairly well-controlled. Continues primidone 50 mg tablets but does not like side effects. Continue propranolol 40 mg 3 times a day.      Relevant Medications   propranolol (INDERAL) 40 MG tablet   primidone (MYSOLINE) 50 MG tablet   Other Visit Diagnoses     Skin lesions       Relevant Orders   Ambulatory referral to Dermatology      Patient Instructions  Health Maintenance, Male Adopting a healthy lifestyle and getting preventive care are important  in promoting health and wellness. Ask your health care provider about: The right schedule for you to  have regular tests and exams. Things you can do on your own to prevent diseases and keep yourself healthy. What should I know about diet, weight, and exercise? Eat a healthy diet  Eat a diet that includes plenty of vegetables, fruits, low-fat dairy products, and lean protein. Do not eat a lot of foods that are high in solid fats, added sugars, or sodium. Maintain a healthy weight Body mass index (BMI) is a measurement that can be used to identify possible weight problems. It estimates body fat based on height and weight. Your health care provider can help determine your BMI and help you achieve or maintain a healthy weight. Get regular exercise Get regular exercise. This is one of the most important things you can do for your health. Most adults should: Exercise for at least 150 minutes each week. The exercise should increase your heart rate and make you sweat (moderate-intensity exercise). Do strengthening exercises at least twice a week. This is in addition to the moderate-intensity exercise. Spend less time sitting. Even light physical activity can be beneficial. Watch cholesterol and blood lipids Have your blood tested for lipids and cholesterol at 58 years of age, then have this test every 5 years. You may need to have your cholesterol levels checked more often if: Your lipid or cholesterol levels are high. You are older than 58 years of age. You are at high risk for heart disease. What should I know about cancer screening? Many types of cancers can be detected early and may often be prevented. Depending on your health history and family history, you may need to have cancer screening at various ages. This may include screening for: Colorectal cancer. Prostate cancer. Skin cancer. Lung cancer. What should I know about heart disease, diabetes, and high blood pressure? Blood pressure and heart disease High blood pressure causes heart disease and increases the risk of stroke. This is  more likely to develop in people who have high blood pressure readings or are overweight. Talk with your health care provider about your target blood pressure readings. Have your blood pressure checked: Every 3-5 years if you are 14-58 years of age. Every year if you are 38 years old or older. If you are between the ages of 54 and 38 and are a current or former smoker, ask your health care provider if you should have a one-time screening for abdominal aortic aneurysm (AAA). Diabetes Have regular diabetes screenings. This checks your fasting blood sugar level. Have the screening done: Once every three years after age 9 if you are at a normal weight and have a low risk for diabetes. More often and at a younger age if you are overweight or have a high risk for diabetes. What should I know about preventing infection? Hepatitis B If you have a higher risk for hepatitis B, you should be screened for this virus. Talk with your health care provider to find out if you are at risk for hepatitis B infection. Hepatitis C Blood testing is recommended for: Everyone born from [birth year] through 1967. Anyone with known risk factors for hepatitis C. Sexually transmitted infections (STIs) You should be screened each year for STIs, including gonorrhea and chlamydia, if: You are sexually active and are younger than 58 years of age. You are older than 58 years of age and your health care provider tells you that you are at risk for this  type of infection. Your sexual activity has changed since you were last screened, and you are at increased risk for chlamydia or gonorrhea. Ask your health care provider if you are at risk. Ask your health care provider about whether you are at high risk for HIV. Your health care provider may recommend a prescription medicine to help prevent HIV infection. If you choose to take medicine to prevent HIV, you should first get tested for HIV. You should then be tested every 3 months for as  long as you are taking the medicine. Follow these instructions at home: Alcohol use Do not drink alcohol if your health care provider tells you not to drink. If you drink alcohol: Limit how much you have to 0-2 drinks a day. Know how much alcohol is in your drink. In the [place] one drink equals one 12 oz bottle of beer (355 mL), one 5 oz glass of wine (148 mL), or one 1 oz glass of hard liquor (44 mL). Lifestyle Do not use any products that contain nicotine or tobacco. These products include cigarettes, chewing tobacco, and vaping devices, such as e-cigarettes. If you need help quitting, ask your health care provider. Do not use street drugs. Do not share needles. Ask your health care provider for help if you need support or information about quitting drugs. General instructions Schedule regular health, dental, and eye exams. Stay current with your vaccines. Tell your health care provider if: You often feel depressed. You have ever been abused or do not feel safe at home. Summary Adopting a healthy lifestyle and getting preventive care are important in promoting health and wellness. Follow your health care provider's instructions about healthy diet, exercising, and getting tested or screened for diseases. Follow your health care provider's instructions on monitoring your cholesterol and blood pressure. This information is not intended to replace advice given to you by your health care provider. Make sure you discuss any questions you have with your health care provider. Document Revised: 03/26/2021 Document Reviewed: 03/26/2021 Elsevier Patient Education  [place], MD [facility]

## 2022-10-22 NOTE — Assessment & Plan Note (Signed)
Stable.  On cilostazol 100 mg twice a day.

## 2022-10-22 NOTE — Assessment & Plan Note (Signed)
Well-controlled.  Continue Maxide 37.5-25 mg tablets daily BP Readings from Last 3 Encounters:  10/22/22 110/68  09/12/22 124/80  08/01/22 124/72  Also taking propranolol for essential tremors

## 2022-10-22 NOTE — Assessment & Plan Note (Signed)
Stable.  Sees Dr. Dwyane Dee, endocrinologist for this. Insulin-dependent.  Also on Farxiga and semaglutide

## 2022-10-22 NOTE — Assessment & Plan Note (Signed)
Fairly well-controlled. Continues primidone 50 mg tablets but does not like side effects. Continue propranolol 40 mg 3 times a day.

## 2022-10-23 ENCOUNTER — Encounter: Payer: Self-pay | Admitting: Emergency Medicine

## 2022-10-23 DIAGNOSIS — L989 Disorder of the skin and subcutaneous tissue, unspecified: Secondary | ICD-10-CM

## 2022-10-24 ENCOUNTER — Other Ambulatory Visit (HOSPITAL_COMMUNITY): Payer: Self-pay

## 2022-10-24 ENCOUNTER — Other Ambulatory Visit: Payer: Self-pay | Admitting: Internal Medicine

## 2022-10-24 ENCOUNTER — Encounter: Payer: Self-pay | Admitting: Internal Medicine

## 2022-10-24 ENCOUNTER — Telehealth: Payer: Self-pay | Admitting: Pharmacy Technician

## 2022-10-24 DIAGNOSIS — E039 Hypothyroidism, unspecified: Secondary | ICD-10-CM

## 2022-10-24 MED ORDER — LEVOTHYROXINE SODIUM 75 MCG PO TABS: 75.0000 ug | ORAL_TABLET | Freq: Every day | ORAL | 0 refills | Status: DC

## 2022-10-24 MED ORDER — LEVOTHYROXINE SODIUM 75 MCG PO TABS: 75.0000 ug | ORAL_TABLET | Freq: Every day | ORAL | 1 refills | Status: DC

## 2022-10-24 NOTE — Telephone Encounter (Addendum)
Pharmacy Patient Advocate Encounter   Received notification from CMA/pt msg that prior authorization for [facility] 3 is required/requested. (Renewal, but new ins)   PA submitted on 10/24/22 to [facility] via [place] PA Case ID: [id number] Status is pending

## 2022-10-25 DIAGNOSIS — L570 Actinic keratosis: Secondary | ICD-10-CM | POA: Diagnosis not present

## 2022-10-25 DIAGNOSIS — I8393 Asymptomatic varicose veins of bilateral lower extremities: Secondary | ICD-10-CM | POA: Diagnosis not present

## 2022-10-25 DIAGNOSIS — D235 Other benign neoplasm of skin of trunk: Secondary | ICD-10-CM | POA: Diagnosis not present

## 2022-10-25 DIAGNOSIS — D225 Melanocytic nevi of trunk: Secondary | ICD-10-CM | POA: Diagnosis not present

## 2022-10-28 ENCOUNTER — Telehealth: Payer: Self-pay

## 2022-10-28 ENCOUNTER — Encounter: Payer: Self-pay | Admitting: Endocrinology

## 2022-10-28 ENCOUNTER — Other Ambulatory Visit (HOSPITAL_COMMUNITY): Payer: Self-pay

## 2022-10-28 ENCOUNTER — Encounter: Payer: Self-pay | Admitting: Internal Medicine

## 2022-10-28 NOTE — Telephone Encounter (Signed)
Pharmacy Patient Advocate Encounter  Prior Authorization for  TRESIBA 100 UNIT/ML PENS has been approved.    PA# [id number] Effective dates: 10/28/22 through 10/28/23  Received notification from [facility] that prior authorization for TRESIBA 100 UNIT/ML PENS is needed.    PA submitted on 10/28/22 Key [id number] Status is pending  Karie Soda, [facility] Patient Advocate Specialist Direct Number: [phone] Fax: [phone]

## 2022-10-29 ENCOUNTER — Encounter: Payer: Self-pay | Admitting: Endocrinology

## 2022-10-30 NOTE — Telephone Encounter (Signed)
Key: [id number]  Approved Coverage Ends on: 10/30/2023

## 2022-11-04 NOTE — Telephone Encounter (Signed)
Any update?

## 2022-11-05 ENCOUNTER — Other Ambulatory Visit (HOSPITAL_COMMUNITY): Payer: Self-pay

## 2022-11-05 NOTE — Telephone Encounter (Signed)
Pharmacy Patient Advocate Encounter  Received notification from [facility] that the request for prior authorization for [facility] 3 sensors has been denied due to not having documentation of improved glycemic control or use of a pump.     This determination is currently being re-reviewed. I called based on Dr. Jodelle Green notes and the pt's Elenor Legato d/l showing he's been in range 82% of the time. Will fax d/l to [phone] The reviewer will either make a determination by fax or reach back out in the next 24 to 48 hrs via phone for more info.

## 2022-11-06 NOTE — Telephone Encounter (Signed)
Rep called back regarding peer to peer. Said they hadn't receive additional documentation to support improved glucose levels. Faxed to [phone] with [employer]: [id number]

## 2022-11-06 NOTE — Telephone Encounter (Signed)
Patient came in to office today and picked up sample of Libre 3 Sensor.

## 2022-11-07 ENCOUNTER — Other Ambulatory Visit (HOSPITAL_COMMUNITY): Payer: Self-pay

## 2022-11-07 NOTE — Telephone Encounter (Signed)
Called [phone] for f/u to make sure they received the info. They are having trouble with their phone lines and aren't able to rout me to the PA dept at this time. We haven't received a determination yet and I will continue to f/u.

## 2022-11-08 NOTE — Telephone Encounter (Signed)
Zachary Murray (rep from [facility]) called again. He said he hadn't received the fax as of yet. Verified fax number that he provided and informed him that I faxed it on Wednesday. He asked if he could just have a verbal that %82 was improvement from previous readings. I confirmed with "yes". He said he would work on approval.

## 2022-11-14 ENCOUNTER — Other Ambulatory Visit (HOSPITAL_COMMUNITY): Payer: Self-pay

## 2022-11-14 NOTE — Telephone Encounter (Signed)
Pharmacy Patient Advocate Encounter  Prior Authorization for [employer] 3 Sensors have been approved.    PA# [id number] Effective dates: 11/08/22 through 11/08/23  Spoke with Pharmacy to process.

## 2022-11-14 NOTE — Telephone Encounter (Signed)
My chart message has been sent to patient.

## 2022-11-21 ENCOUNTER — Encounter: Payer: Self-pay | Admitting: Endocrinology

## 2022-11-21 ENCOUNTER — Encounter: Payer: Self-pay | Admitting: Internal Medicine

## 2022-11-21 ENCOUNTER — Other Ambulatory Visit: Payer: Self-pay | Admitting: Internal Medicine

## 2022-11-21 DIAGNOSIS — F411 Generalized anxiety disorder: Secondary | ICD-10-CM

## 2022-11-21 DIAGNOSIS — E1165 Type 2 diabetes mellitus with hyperglycemia: Secondary | ICD-10-CM

## 2022-11-21 DIAGNOSIS — E118 Type 2 diabetes mellitus with unspecified complications: Secondary | ICD-10-CM

## 2022-11-21 MED ORDER — TRESIBA FLEXTOUCH 100 UNIT/ML ~~LOC~~ SOPN: 30.0000 [IU] | PEN_INJECTOR | Freq: Every day | SUBCUTANEOUS | 3 refills | Status: DC

## 2022-11-26 ENCOUNTER — Ambulatory Visit: Payer: Medicaid Other | Admitting: Internal Medicine

## 2022-11-26 ENCOUNTER — Encounter: Payer: Self-pay | Admitting: Internal Medicine

## 2022-11-26 VITALS — BP 128/82 | HR 88 | Temp 98.1°F | Resp 16 | Ht 72.0 in | Wt 343.0 lb

## 2022-11-26 DIAGNOSIS — R0609 Other forms of dyspnea: Secondary | ICD-10-CM | POA: Diagnosis not present

## 2022-11-26 DIAGNOSIS — E785 Hyperlipidemia, unspecified: Secondary | ICD-10-CM

## 2022-11-26 DIAGNOSIS — E039 Hypothyroidism, unspecified: Secondary | ICD-10-CM | POA: Diagnosis not present

## 2022-11-26 DIAGNOSIS — I10 Essential (primary) hypertension: Secondary | ICD-10-CM | POA: Diagnosis not present

## 2022-11-26 DIAGNOSIS — E118 Type 2 diabetes mellitus with unspecified complications: Secondary | ICD-10-CM | POA: Diagnosis not present

## 2022-11-26 DIAGNOSIS — Z125 Encounter for screening for malignant neoplasm of prostate: Secondary | ICD-10-CM | POA: Diagnosis not present

## 2022-11-26 DIAGNOSIS — F411 Generalized anxiety disorder: Secondary | ICD-10-CM

## 2022-11-26 DIAGNOSIS — M5416 Radiculopathy, lumbar region: Secondary | ICD-10-CM | POA: Diagnosis not present

## 2022-11-26 DIAGNOSIS — R9431 Abnormal electrocardiogram [ECG] [EKG]: Secondary | ICD-10-CM | POA: Diagnosis not present

## 2022-11-26 DIAGNOSIS — M170 Bilateral primary osteoarthritis of knee: Secondary | ICD-10-CM | POA: Diagnosis not present

## 2022-11-26 LAB — URINALYSIS, ROUTINE W REFLEX MICROSCOPIC
Bilirubin Urine: NEGATIVE
Hgb urine dipstick: NEGATIVE
Ketones, ur: NEGATIVE
Leukocytes,Ua: NEGATIVE
Nitrite: NEGATIVE
RBC / HPF: NONE SEEN (ref 0–?)
Specific Gravity, Urine: 1.01 (ref 1.000–1.030)
Total Protein, Urine: NEGATIVE
Urine Glucose: NEGATIVE
Urobilinogen, UA: 1 (ref 0.0–1.0)
WBC, UA: NONE SEEN (ref 0–?)
pH: 7 (ref 5.0–8.0)

## 2022-11-26 LAB — PSA: PSA: 2.11 ng/mL (ref 0.10–4.00)

## 2022-11-26 LAB — BRAIN NATRIURETIC PEPTIDE: Pro B Natriuretic peptide (BNP): 11 pg/mL (ref 0.0–100.0)

## 2022-11-26 LAB — LIPID PANEL
Cholesterol: 140 mg/dL (ref 0–200)
HDL: 36.7 mg/dL — ABNORMAL LOW (ref >39.00)
LDL Cholesterol: 86 mg/dL (ref 0–99)
NonHDL: 102.9
Total CHOL/HDL Ratio: 4
Triglycerides: 85 mg/dL (ref 0.0–149.0)
VLDL: 17 mg/dL (ref 0.0–40.0)

## 2022-11-26 LAB — MICROALBUMIN / CREATININE URINE RATIO
Creatinine,U: 83 mg/dL
Microalb Creat Ratio: 1.5 mg/g (ref 0.0–30.0)
Microalb, Ur: 1.2 mg/dL (ref 0.0–1.9)

## 2022-11-26 LAB — TSH: TSH: 2.33 u[IU]/mL (ref 0.35–5.50)

## 2022-11-26 LAB — TROPONIN I (HIGH SENSITIVITY): High Sens Troponin I: 3 ng/L (ref 2–17)

## 2022-11-26 MED ORDER — ALPRAZOLAM 0.5 MG PO TABS: ORAL_TABLET | ORAL | 1 refills | Status: DC

## 2022-11-26 NOTE — Patient Instructions (Signed)
Hypertension, Adult High blood pressure (hypertension) is when the force of blood pumping through the arteries is too strong. The arteries are the blood vessels that carry blood from the heart throughout the body. Hypertension forces the heart to work harder to pump blood and may cause arteries to become narrow or stiff. Untreated or uncontrolled hypertension can lead to a heart attack, heart failure, a stroke, kidney disease, and other problems. A blood pressure reading consists of a higher number over a lower number. Ideally, your blood pressure should be below 120/80. The first ("top") number is called the systolic pressure. It is a measure of the pressure in your arteries as your heart beats. The second ("bottom") number is called the diastolic pressure. It is a measure of the pressure in your arteries as the heart relaxes. What are the causes? The exact cause of this condition is not known. There are some conditions that result in high blood pressure. What increases the risk? Certain factors may make you more likely to develop high blood pressure. Some of these risk factors are under your control, including: Smoking. Not getting enough exercise or physical activity. Being overweight. Having too much fat, sugar, calories, or salt (sodium) in your diet. Drinking too much alcohol. Other risk factors include: Having a personal history of heart disease, diabetes, high cholesterol, or kidney disease. Stress. Having a family history of high blood pressure and high cholesterol. Having obstructive sleep apnea. Age. The risk increases with age. What are the signs or symptoms? High blood pressure may not cause symptoms. Very high blood pressure (hypertensive crisis) may cause: Headache. Fast or irregular heartbeats (palpitations). Shortness of breath. Nosebleed. Nausea and vomiting. Vision changes. Severe chest pain, dizziness, and seizures. How is this diagnosed? This condition is diagnosed by  measuring your blood pressure while you are seated, with your arm resting on a flat surface, your legs uncrossed, and your feet flat on the floor. The cuff of the blood pressure monitor will be placed directly against the skin of your upper arm at the level of your heart. Blood pressure should be measured at least twice using the same arm. Certain conditions can cause a difference in blood pressure between your right and left arms. If you have a high blood pressure reading during one visit or you have normal blood pressure with other risk factors, you may be asked to: Return on a different day to have your blood pressure checked again. Monitor your blood pressure at home for 1 week or longer. If you are diagnosed with hypertension, you may have other blood or imaging tests to help your health care provider understand your overall risk for other conditions. How is this treated? This condition is treated by making healthy lifestyle changes, such as eating healthy foods, exercising more, and reducing your alcohol intake. You may be referred for counseling on a healthy diet and physical activity. Your health care provider may prescribe medicine if lifestyle changes are not enough to get your blood pressure under control and if: Your systolic blood pressure is above 130. Your diastolic blood pressure is above 80. Your personal target blood pressure may vary depending on your medical conditions, your age, and other factors. Follow these instructions at home: Eating and drinking  Eat a diet that is high in fiber and potassium, and low in sodium, added sugar, and fat. An example of this eating plan is called the DASH diet. DASH stands for Dietary Approaches to Stop Hypertension. To eat this way: Eat   plenty of fresh fruits and vegetables. Try to fill one half of your plate at each meal with fruits and vegetables. Eat whole grains, such as whole-wheat pasta, brown rice, or whole-grain bread. Fill about one  fourth of your plate with whole grains. Eat or drink low-fat dairy products, such as skim milk or low-fat yogurt. Avoid fatty cuts of meat, processed or cured meats, and poultry with skin. Fill about one fourth of your plate with lean proteins, such as fish, chicken without skin, beans, eggs, or tofu. Avoid pre-made and processed foods. These tend to be higher in sodium, added sugar, and fat. Reduce your daily sodium intake. Many people with hypertension should eat less than 1,500 mg of sodium a day. Do not drink alcohol if: Your health care provider tells you not to drink. You are pregnant, may be pregnant, or are planning to become pregnant. If you drink alcohol: Limit how much you have to: 0-1 drink a day for women. 0-2 drinks a day for men. Know how much alcohol is in your drink. In the [place] one drink equals one 12 oz bottle of beer (355 mL), one 5 oz glass of wine (148 mL), or one 1 oz glass of hard liquor (44 mL). Lifestyle  Work with your health care provider to maintain a healthy body weight or to lose weight. Ask what an ideal weight is for you. Get at least 30 minutes of exercise that causes your heart to beat faster (aerobic exercise) most days of the week. Activities may include walking, swimming, or biking. Include exercise to strengthen your muscles (resistance exercise), such as Pilates or lifting weights, as part of your weekly exercise routine. Try to do these types of exercises for 30 minutes at least 3 days a week. Do not use any products that contain nicotine or tobacco. These products include cigarettes, chewing tobacco, and vaping devices, such as e-cigarettes. If you need help quitting, ask your health care provider. Monitor your blood pressure at home as told by your health care provider. Keep all follow-up visits. This is important. Medicines Take over-the-counter and prescription medicines only as told by your health care provider. Follow directions carefully. Blood  pressure medicines must be taken as prescribed. Do not skip doses of blood pressure medicine. Doing this puts you at risk for problems and can make the medicine less effective. Ask your health care provider about side effects or reactions to medicines that you should watch for. Contact a health care provider if you: Think you are having a reaction to a medicine you are taking. Have headaches that keep coming back (recurring). Feel dizzy. Have swelling in your ankles. Have trouble with your vision. Get help right away if you: Develop a severe headache or confusion. Have unusual weakness or numbness. Feel faint. Have severe pain in your chest or abdomen. Vomit repeatedly. Have trouble breathing. These symptoms may be an emergency. Get help right away. Call 911. Do not wait to see if the symptoms will go away. Do not drive yourself to the hospital. Summary Hypertension is when the force of blood pumping through your arteries is too strong. If this condition is not controlled, it may put you at risk for serious complications. Your personal target blood pressure may vary depending on your medical conditions, your age, and other factors. For most people, a normal blood pressure is less than 120/80. Hypertension is treated with lifestyle changes, medicines, or a combination of both. Lifestyle changes include losing weight, eating a healthy,   low-sodium diet, exercising more, and limiting alcohol. This information is not intended to replace advice given to you by your health care provider. Make sure you discuss any questions you have with your health care provider. Document Revised: 09/11/2021 Document Reviewed: 09/11/2021 Elsevier Patient Education  2023 Elsevier Inc.  

## 2022-11-26 NOTE — Progress Notes (Signed)
Subjective:  Patient ID: Zachary Murray, male    DOB: 1966-04-21  Age: 57 y.o. MRN: [id number]  CC: Diabetes, Hypertension, Hyperlipidemia, and Osteoarthritis   [name] presents for f/up -  He is not very active but when he is active he has had dyspnea on exertion for several months.  He denies chest pain, diaphoresis, palpitations, or edema.  Outpatient Medications Prior to Visit  Medication Sig Dispense Refill   Blood Glucose Monitoring Suppl ([place]) w/Device KIT Use as instructed to check blood sugar once daily. 1 kit 0   cilostazol (PLETAL) 100 MG tablet Take 1 tablet (100 mg total) by mouth 2 (two) times daily. 180 tablet 3   clomiPHENE (CLOMID) 50 MG tablet Half tablet at bedtime 2 days a week 20 tablet 1   Continuous Blood Gluc Sensor (FREESTYLE LIBRE 3 SENSOR) MISC 1 Device by Does not apply route every 14 (fourteen) days. Apply 1 sensor on upper arm every 14 days for continuous glucose monitoring 2 each 2   dapagliflozin propanediol (FARXIGA) 10 MG TABS tablet Take 1 tablet (10 mg total) by mouth daily before breakfast. 30 tablet 2   glucose blood (ONETOUCH VERIO) test strip 1 each by Other route as needed for other. Use as instructed 100 each 1   glucose blood test strip Use onetouch verio flex test strip as instructed to check blood sugar once daily. 100 each 3   insulin degludec (TRESIBA FLEXTOUCH) 100 UNIT/ML FlexTouch Pen Inject 30 Units into the skin daily. 15 mL 3   Insulin Pen Needle (RELION PEN NEEDLES) 31G X 6 MM MISC Inject 1 Act into the skin once a week. Use as needed for insulin injection. 30 each 2   Lancets (ONETOUCH DELICA PLUS [id number]) MISC Use to check blood sugar once a day 100 each 1   levothyroxine (SYNTHROID) 75 MCG tablet Take 1 tablet (75 mcg total) by mouth daily before breakfast. 90 tablet 1   primidone (MYSOLINE) 50 MG tablet Take 1 tablet (50 mg total) by mouth at bedtime. 90 tablet 3   propranolol (INDERAL) 40 MG tablet  Take 1 tablet (40 mg total) by mouth 3 (three) times daily. 270 tablet 1   Semaglutide, 1 MG/DOSE, 4 MG/3ML SOPN Inject 1 mg as directed once a week. 3 mL 2   Testosterone 1.62 % GEL Apply 1 pump on each upper arm daily 75 g 5   triamterene-hydrochlorothiazide (MAXZIDE-25) 37.5-25 MG tablet Take 1 tablet by mouth daily. 90 tablet 1   ALPRAZolam (XANAX) 0.5 MG tablet TAKE 1 TABLET(0.5 MG) BY MOUTH THREE TIMES DAILY AS NEEDED FOR ANXIETY 270 tablet 1   No facility-administered medications prior to visit.    ROS Review of Systems  Constitutional:  Negative for chills, diaphoresis, fatigue and fever.  Eyes: Negative.   Respiratory:  Positive for shortness of breath. Negative for chest tightness and wheezing.   Cardiovascular:  Negative for chest pain, palpitations and leg swelling.  Gastrointestinal: Negative.  Negative for abdominal pain, diarrhea, nausea and vomiting.  Endocrine: Negative.   Genitourinary: Negative.   Musculoskeletal:  Positive for arthralgias and back pain. Negative for joint swelling and myalgias.  Skin: Negative.   Neurological:  Positive for tremors. Negative for dizziness and weakness.  Hematological:  Negative for adenopathy. Does not bruise/bleed easily.  Psychiatric/Behavioral:  Negative for confusion, decreased concentration, dysphoric mood, sleep disturbance and suicidal ideas. The patient is nervous/anxious.     Objective:  BP 128/82 (BP  Location: Right Arm, Patient Position: Sitting, Cuff Size: Large)   Pulse 88   Temp 98.1 F (36.7 C) (Oral)   Resp 16   Ht 6' (1.829 m)   Wt (!) 343 lb (155.6 kg)   SpO2 93%   BMI 46.52 kg/m   BP Readings from Last 3 Encounters:  11/26/22 128/82  10/22/22 110/68  09/12/22 124/80    Wt Readings from Last 3 Encounters:  11/26/22 (!) 343 lb (155.6 kg)  10/22/22 (!) 343 lb 2 oz (155.6 kg)  09/12/22 (!) 346 lb 6.4 oz (157.1 kg)    Physical Exam Vitals reviewed.  HENT:     Mouth/Throat:     Mouth: Mucous  membranes are moist.  Eyes:     General: No scleral icterus.    Conjunctiva/sclera: Conjunctivae normal.  Cardiovascular:     Rate and Rhythm: Normal rate and regular rhythm.     Heart sounds: No murmur heard.    No friction rub. No gallop.     Comments: EKG- NSR, 85 bpm Septal infarct pattern is new No LVH or acute ST/T wave changes Pulmonary:     Effort: Pulmonary effort is normal.     Breath sounds: No stridor. No wheezing, rhonchi or rales.  Abdominal:     General: Abdomen is protuberant. Bowel sounds are normal. There is no distension.     Palpations: Abdomen is soft. There is no hepatomegaly, splenomegaly or mass.     Tenderness: There is no abdominal tenderness. There is no guarding.  Musculoskeletal:        General: Deformity (djd) present. No swelling or tenderness. Normal range of motion.     Cervical back: Neck supple.     Right lower leg: No edema.     Left lower leg: No edema.  Lymphadenopathy:     Cervical: No cervical adenopathy.  Skin:    General: Skin is warm and dry.     Coloration: Skin is not pale.  Neurological:     General: No focal deficit present.     Mental Status: He is alert. Mental status is at baseline.  Psychiatric:        Mood and Affect: Mood normal.        Behavior: Behavior normal.     Lab Results  Component Value Date   WBC 5.3 09/10/2022   HGB 15.9 09/10/2022   HCT 47.1 09/10/2022   PLT 196.0 09/10/2022   GLUCOSE 135 (H) 09/10/2022   CHOL 140 11/26/2022   TRIG 85.0 11/26/2022   HDL 36.70 (L) 11/26/2022   LDLCALC 86 11/26/2022   ALT 17 07/30/2022   AST 16 07/30/2022   NA 141 09/10/2022   K 4.5 09/10/2022   CL 104 09/10/2022   CREATININE 0.88 09/10/2022   BUN 17 09/10/2022   CO2 32 09/10/2022   TSH 2.33 11/26/2022   PSA 2.11 11/26/2022   INR 1.2 (H) 12/18/2021   HGBA1C 8.8 (H) 09/10/2022   MICROALBUR 1.2 11/26/2022    VAS [place] LOWER EXTREMITY VENOUS REFLUX  Result Date: 03/30/2021  Lower Venous Reflux Study Patient  Name:  Zachary Murray  Date of Exam:   03/30/2021 Medical Rec #: [id number]     Accession #:    [id number] Date of Birth: Jan 05, 1966     Patient Gender: M Patient Age:   55Y Exam Location:  [facility] Procedure:      VAS [place] LOWER EXTREMITY VENOUS REFLUX Referring Phys: [id number] [place] --------------------------------------------------------------------------------  Indications: Pain, Swelling, and varicosities.  Comparison Study: 02/27/2021: No evidence of deep vein thrombosis bilaterally.                   Age indeterminate venous thrombosis in the bilateral greater                   saphenous veins. Performing Technologist: Ivan Croft  Examination Guidelines: A complete evaluation includes B-mode imaging, spectral Doppler, color Doppler, and power Doppler as needed of all accessible portions of each vessel. Bilateral testing is considered an integral part of a complete examination. Limited examinations for reoccurring indications may be performed as noted. The reflux portion of the exam is performed with the patient in reverse Trendelenburg. Significant venous reflux is defined as >500 ms in the superficial venous system, and >1 second in the deep venous system.  Venous Reflux Times +----------------------+---------+------+-----------+------------+-------------+ RIGHT                 Reflux NoRefluxReflux TimeDiameter cmsComments                                      Yes                                       +----------------------+---------+------+-----------+------------+-------------+ CFV                   no                                                  +----------------------+---------+------+-----------+------------+-------------+ FV mid                no                                                  +----------------------+---------+------+-----------+------------+-------------+ Popliteal             no                                                   +----------------------+---------+------+-----------+------------+-------------+ GSV at SFJ                      yes    >500 ms      0.67                  +----------------------+---------+------+-----------+------------+-------------+ GSV prox thigh                  yes    >500 ms      0.68                  +----------------------+---------+------+-----------+------------+-------------+ GSV mid thigh                   yes    >500 ms      0.45                  +----------------------+---------+------+-----------+------------+-------------+  GSV dist thigh                  yes    >500 ms      0.39                  +----------------------+---------+------+-----------+------------+-------------+ GSV at knee                     yes    >500 ms      0.41    out of fascia +----------------------+---------+------+-----------+------------+-------------+ GSV prox calf                   yes    >500 ms      0.48    out of fascia +----------------------+---------+------+-----------+------------+-------------+ SSV Pop Fossa         no                            0.49                  +----------------------+---------+------+-----------+------------+-------------+ SSV prox calf                   yes    >500 ms      0.39                  +----------------------+---------+------+-----------+------------+-------------+ SSV mid calf          no                            0.25                  +----------------------+---------+------+-----------+------------+-------------+ Distal calf perforator          yes    >500 ms                            +----------------------+---------+------+-----------+------------+-------------+  +-------------------+---------+------+-----------+------------+--------+ LEFT               Reflux NoRefluxReflux TimeDiameter cmsComments                              Yes                                   +-------------------+---------+------+-----------+------------+--------+ CFV                          yes   >1 second                      +-------------------+---------+------+-----------+------------+--------+ FV mid             no                                             +-------------------+---------+------+-----------+------------+--------+ Popliteal          no                                             +-------------------+---------+------+-----------+------------+--------+  GSV at [facility]                   yes    >500 ms      0.96             +-------------------+---------+------+-----------+------------+--------+ GSV prox thigh               yes    >500 ms      0.63             +-------------------+---------+------+-----------+------------+--------+ GSV mid thigh                yes    >500 ms      0.46             +-------------------+---------+------+-----------+------------+--------+ GSV dist thigh               yes    >500 ms      0.47             +-------------------+---------+------+-----------+------------+--------+ GSV at knee                  yes    >500 ms      0.55             +-------------------+---------+------+-----------+------------+--------+ GSV prox calf                yes    >500 ms      0.51             +-------------------+---------+------+-----------+------------+--------+ SSV Pop Fossa      no                            0.56             +-------------------+---------+------+-----------+------------+--------+ SSV prox calf      no                            0.41             +-------------------+---------+------+-----------+------------+--------+ SSV mid calf       no                            0.33             +-------------------+---------+------+-----------+------------+--------+ Mid calf perforator          yes    >500 ms                        +-------------------+---------+------+-----------+------------+--------+   Summary: Bilateral: - No evidence of deep vein thrombosis seen in the lower extremities, bilaterally, from the common femoral through the popliteal veins.  Right: - Color duplex evaluation of the right lower extremity shows there is thrombus in the distal greater saphenous vein. - Venous reflux is noted in the right sapheno-femoral junction. - Venous reflux is noted in the right greater saphenous vein in the thigh. - Venous reflux is noted in the right greater saphenous vein in the calf. - Venous reflux is noted in the right short saphenous vein. - Venous reflux is noted in the right calf perforator vein.  Left: - Color duplex evaluation of the left lower extremity shows there is thrombus in the distal greater saphenous vein and proximal greater saphenous vein. - Venous reflux is noted in the left common femoral vein. - Venous reflux  is noted in the left sapheno-femoral junction. - Venous reflux is noted in the left greater saphenous vein in the thigh. - Venous reflux is noted in the left greater saphenous vein in the calf. - Venous reflux is noted in the left calf perforator vein.  *See table(s) above for measurements and observations. Electronically signed by Servando Snare MD on 03/30/2021 at 5:04:43 PM.    Final     Assessment & Plan:   Zachary Murray was seen today for diabetes, hypertension, hyperlipidemia and osteoarthritis.  Diagnoses and all orders for this visit:  Essential hypertension, benign- BP is well controlled. -     Urinalysis, Routine w reflex microscopic; Future -     EKG 12-Lead -     Urinalysis, Routine w reflex microscopic  GAD (generalized anxiety disorder) -     ALPRAZolam (XANAX) 0.5 MG tablet; TAKE 1 TABLET(0.5 MG) BY MOUTH THREE TIMES DAILY AS NEEDED FOR ANXIETY  Acquired hypothyroidism-he is euthyroid. -     TSH; Future -     TSH  Type II diabetes mellitus with manifestations (HCC) -     Urinalysis,  Routine w reflex microscopic; Future -     Microalbumin / creatinine urine ratio; Future -     Microalbumin / creatinine urine ratio -     Urinalysis, Routine w reflex microscopic  Severe obesity (BMI >= 40) (HCC)  Hyperlipidemia with target LDL less than 130- Will start a statin. -     Lipid panel; Future -     TSH; Future -     TSH -     Lipid panel  Prostate cancer screening -     PSA; Future -     PSA  Abnormal electrocardiogram- Enzymes are normal.  Will evaluate for infarct/ischemia. -     MYOCARDIAL PERFUSION IMAGING; Future -     Troponin I (High Sensitivity); Future -     Brain natriuretic peptide; Future -     Cardiac Stress Test: Informed Consent Details: Physician/Practitioner Attestation; Transcribe to consent form and obtain patient signature; Future -     Brain natriuretic peptide -     Troponin I (High Sensitivity)  DOE (dyspnea on exertion) -     MYOCARDIAL PERFUSION IMAGING; Future -     Troponin I (High Sensitivity); Future -     Brain natriuretic peptide; Future -     Cardiac Stress Test: Informed Consent Details: Physician/Practitioner Attestation; Transcribe to consent form and obtain patient signature; Future -     Brain natriuretic peptide -     Troponin I (High Sensitivity)  Lumbar radiculopathy -     meloxicam (MOBIC) 7.5 MG tablet; Take 1 tablet (7.5 mg total) by mouth daily.  Primary osteoarthritis of both knees -     meloxicam (MOBIC) 7.5 MG tablet; Take 1 tablet (7.5 mg total) by mouth daily.   I am having Zachary Murray. Zachary "Yvone Neu" start on meloxicam. I am also having him maintain his glucose blood, OneTouch Verio Flex System, [employer] Plus [id number], OneTouch Verio, [employer] 3 Sensor, clomiPHENE, Semaglutide (1 MG/DOSE), dapagliflozin propanediol, ReliOn Pen Needles, Testosterone, propranolol, primidone, triamterene-hydrochlorothiazide, cilostazol, levothyroxine, Tresiba FlexTouch, and ALPRAZolam.  Meds ordered this encounter   Medications   ALPRAZolam (XANAX) 0.5 MG tablet    Sig: TAKE 1 TABLET(0.5 MG) BY MOUTH THREE TIMES DAILY AS NEEDED FOR ANXIETY    Dispense:  270 tablet    Refill:  1   meloxicam (MOBIC) 7.5 MG tablet    Sig:  Take 1 tablet (7.5 mg total) by mouth daily.    Dispense:  90 tablet    Refill:  0     Follow-up: No follow-ups on file.  Scarlette Calico, MD

## 2022-11-29 MED ORDER — ROSUVASTATIN CALCIUM 10 MG PO TABS: 10.0000 mg | ORAL_TABLET | Freq: Every day | ORAL | 1 refills | Status: DC

## 2022-11-29 MED ORDER — MELOXICAM 7.5 MG PO TABS: 7.5000 mg | ORAL_TABLET | Freq: Every day | ORAL | 0 refills | Status: DC

## 2022-12-03 ENCOUNTER — Other Ambulatory Visit: Payer: Self-pay | Admitting: Internal Medicine

## 2022-12-03 DIAGNOSIS — R9431 Abnormal electrocardiogram [ECG] [EKG]: Secondary | ICD-10-CM

## 2022-12-11 ENCOUNTER — Telehealth: Payer: Self-pay

## 2022-12-11 NOTE — Patient Instructions (Addendum)
Mr. Canal,  It was a pleasure speaking with you today.  I just wanted to provide a summary of our discussion and my contact information for any future needs.  In regard to you diabetes management, I recommend: -A trial of administering Tresiba in the morning versus prior to bedtime to see if this decreases low alerts during the night and improves blood glucose values after meals -Wearing your Libre sensor on the arm you are less likely to sleep on if a side-sleeper and not covering it with any additional tape to secure.  Consistent pressure can lead to false lows. -Continuing to calibrate your sensors by meter use if readings seem skewed; hopefully this is just specific to that one sensor and won't be ongoing for you.  After thinking more about it, I would also recommend keeping your 3 month appointment since it falls right after your lab work; so you and Dr. Ronnald Ramp can discuss those values.    As I mentioned, I will glance at those labs once they are done.  Then I can reach out to you and Dr Ronnald Ramp before that follow-up appointment with any suggestions I may have.  I am also including my number below if you have any needs before then.  Thank you and have a great evening!  Darlina Guys, PharmD, [place]

## 2022-12-11 NOTE — Progress Notes (Signed)
12/11/2022 Name: MATAIO MELE MRN: [id number] DOB: 1966/02/03  Chief Complaint  Patient presents with   Medication Management    JEROL RUFENER is a 57 y.o. year old male who presented for a telephone visit.   They were referred to the pharmacist by a quality report for assistance in managing diabetes.   Patient is participating in a Managed Medicaid Plan:  Yes  Subjective: Patient identified as having managed medicaid and diabetes diagnosis, indicating contact by pharmacy for medication management in attempt to assist with control of disease state(s).  Care Team: Primary Care Provider: [name], MD ; Next Scheduled Visit: 12/24/22 for labs and 01/20/23 for 3 month f/u with PCP  Medication Access/Adherence  Current Pharmacy:  [id number] [address], [address] AT [facility] [address] [place] [address] Phone: [phone] Fax: [phone]  [facility] [phone] - [place], [address] [place] [address] Phone: [phone] Fax: [phone]  Patient reports affordability concerns with their medications: No  Patient reports access/transportation concerns to their pharmacy: No  Patient reports adherence concerns with their medications:  No  Reports adherence except for period of time at the end of 2023 he was without prescriptions while PA's were being worked on [employer], [place], [place]).  Diabetes:  Current medications:  Tresiba 30 units daily Semaglutide '1mg'$  weekly  Medications tried in the past:  Dapagliflozin when Ozempic was not covered by ins and unavailable at pharmacy Metformin=n/v, Mounjaro=rash  Current glucose readings: FBG typically around 100, post-meal ranges 250-300 Using Libre 3 sensors and calibrating occasionally due to concern with accuracy  Patient reports hypoglycemic values/alarms on [place] device (often during the  night) but only 1 time experiencing s/sx. Patient denies hyperglycemic symptoms including polyuria, polydipsia, polyphagia, nocturia, neuropathy, blurred vision.  Hypertension:  Current medications:  Triamterene-hctz 37.5/'25mg'$  daily  Objective:  Lab Results  Component Value Date   HGBA1C 8.8 (H) 09/10/2022   Lab Results  Component Value Date   CREATININE 0.88 09/10/2022   BUN 17 09/10/2022   NA 141 09/10/2022   K 4.5 09/10/2022   CL 104 09/10/2022   CO2 32 09/10/2022   Lab Results  Component Value Date   CHOL 140 11/26/2022   HDL 36.70 (L) 11/26/2022   LDLCALC 86 11/26/2022   TRIG 85.0 11/26/2022   CHOLHDL 4 11/26/2022   Medications Reviewed Today     Reviewed by Darlina Guys, RPH (Pharmacist) on 12/11/22 at 1444  Med List Status: <None>   Medication Order Taking? Sig Documenting Provider Last Dose Status Informant  ALPRAZolam (XANAX) 0.5 MG tablet [id number] Yes TAKE 1 TABLET(0.5 MG) BY MOUTH THREE TIMES DAILY AS NEEDED FOR ANXIETY [name], MD Taking Active   Blood Glucose Monitoring Suppl ([address]) w/Device KIT [id number] Yes Use as instructed to check blood sugar once daily. Elayne Snare, MD Taking Active   cilostazol (PLETAL) 100 MG tablet [id number] Yes Take 1 tablet (100 mg total) by mouth 2 (two) times daily. Horald Pollen, MD Taking Active   clomiPHENE (CLOMID) 50 MG tablet [id number] Yes Half tablet at bedtime 2 days a week Elayne Snare, MD Taking Active   Continuous Blood Gluc Sensor (FREESTYLE LIBRE 3 SENSOR) [place] [id number] Yes 1 Device by Does not apply route every 14 (fourteen) days. Apply 1 sensor on upper arm every  14 days for continuous glucose monitoring Elayne Snare, MD Taking Active   dapagliflozin propanediol (FARXIGA) 10 MG TABS tablet [id number] No Take 1 tablet (10 mg total) by mouth daily before breakfast.  Patient not taking: Reported on 12/11/2022   Elayne Snare, MD Not Taking Active            Med Note Colin Rhein, [place]  A   Wed Dec 11, 2022  2:37 PM) Was taking when Ozempic was not covered/available   insulin degludec (TRESIBA FLEXTOUCH) 100 UNIT/ML FlexTouch Pen [id number] Yes Inject 30 Units into the skin daily. Elayne Snare, MD Taking Active   Insulin Pen Needle (RELION PEN NEEDLES) 31G X 6 MM MISC [id number] Yes Inject 1 Act into the skin once a week. Use as needed for insulin injection. Elayne Snare, MD Taking Active   Lancets (ONETOUCH DELICA PLUS [id number]) [place] [id number] Yes Use to check blood sugar once a day Elayne Snare, MD Taking Active   levothyroxine (SYNTHROID) 75 MCG tablet [id number] Yes Take 1 tablet (75 mcg total) by mouth daily before breakfast. [name], MD Taking Active   meloxicam [facility]) 7.5 MG tablet [id number] Yes Take 1 tablet (7.5 mg total) by mouth daily. [name], MD Taking Active            Med Note Colin Rhein, [place] A   Wed Dec 11, 2022  2:40 PM) Only when needed for pain  primidone (MYSOLINE) 50 MG tablet [id number] Yes Take 1 tablet (50 mg total) by mouth at bedtime. Horald Pollen, MD Taking Active            Med Note Colin Rhein, [place] A   Wed Dec 11, 2022  2:41 PM) Only taking about one-half tablet due to brain fog and balance  propranolol (INDERAL) 40 MG tablet [id number] Yes Take 1 tablet (40 mg total) by mouth 3 (three) times daily. Horald Pollen, MD Taking Active            Med Note Colin Rhein, [place] A   Wed Dec 11, 2022  2:44 PM) Sometimes takes every 4 hours  rosuvastatin (CRESTOR) 10 MG tablet [id number] Yes Take 1 tablet (10 mg total) by mouth daily. [name], MD Taking Active   Semaglutide, 1 MG/DOSE, 4 MG/3ML [place] [id number] Yes Inject 1 mg as directed once a week. Elayne Snare, MD Taking Active   Testosterone 1.62 % GEL [id number] Yes Apply 1 pump on each upper arm daily Elayne Snare, MD Taking Active   triamterene-hydrochlorothiazide [facility]) 37.5-25 MG tablet [id number] Yes Take 1 tablet by mouth daily. Horald Pollen, MD Taking Active             Assessment/Plan:   Diabetes: - Currently uncontrolled - Reviewed goal A1c, goal fasting, and goal 2 hour post prandial glucose - Recommend to continue current therapy, as this was just consistently restarted approximately 1 month ago.  Could increase ozempic to '2mg'$  if continuing to tolerate '1mg'$  dose well. -Counseled patient to trial taking Tresiba in the morning versus an hour or two before bedtime to see if this decreases lows/alerts during the night and lowers post-prandial values during the day -Counseled that if a side sleeper, wear Libre sensor on opposite arm to prevent pressure to sensor that can trigger false lows with device - Recommend to check glucose regularly and verify with meter if question  Hypertension: - Currently controlled - Recommend to continue current therapy at this time   Follow Up Plan: Will review labs and reach  out if any recommendations prior to 3 month follow-up with Dr Ronnald Ramp 01/20/23.  Darlina Guys, PharmD, DPLA

## 2022-12-15 ENCOUNTER — Encounter: Payer: Self-pay | Admitting: Cardiology

## 2022-12-15 DIAGNOSIS — R0609 Other forms of dyspnea: Secondary | ICD-10-CM | POA: Insufficient documentation

## 2022-12-15 NOTE — Assessment & Plan Note (Signed)
Progressive exertional dyspnea over the last few months.  PCP ordered several tests.  BNP was normal as well as troponin level indicating this is not likely CHF related.  Myoview ordered, has not been scheduled. He does have multiple risk factors for CAD that berry much make up metabolic syndrome: Hypertension, diabetes, obesity, hyperlipidemia (although triglycerides and HDL levels are okay).  Certainly exertional dyspnea could be multifactorial not to mention the fact that he is morbidly obese and not active.  However, I do agree that evaluation is warranted.  With his body habitus, I would prefer to evaluate with a Coronary CTA as opposed to a Myoview stress test which would give [place] both anatomy and physiology.  A better assessment of EF along with diastolic function would be with an echocardiogram which should also be considered.

## 2022-12-15 NOTE — Progress Notes (Unsigned)
Primary Care Provider: [name], MD [place] Cardiologist: None Electrophysiologist: None  Clinic Note: No chief complaint on file.  ===================================  ASSESSMENT/PLAN   Problem List Items Addressed This Visit       Cardiology Problems   Peripheral vascular disease ([place]) (Chronic)   Essential hypertension, benign (Chronic)     Other   Dyspnea on exertion - Primary    Progressive exertional dyspnea over the last few months.  PCP ordered several tests.  BNP was normal as well as troponin level indicating this is not likely CHF related.  Myoview ordered, has not been scheduled. He does have multiple risk factors for CAD that berry much make up metabolic syndrome: Hypertension, diabetes, obesity, hyperlipidemia (although triglycerides and HDL levels are okay).  Certainly exertional dyspnea could be multifactorial not to mention the fact that he is morbidly obese and not active.  However, I do agree that evaluation is warranted.  With his body habitus, I would prefer to evaluate with a Coronary CTA as opposed to a Myoview stress test which would give [place] both anatomy and physiology.  A better assessment of EF along with diastolic function would be with an echocardiogram which should also be considered.      Severe obesity (BMI >= 40) (HCC) (Chronic)   OSA on CPAP (Chronic)   Saphenofemoral venous reflux; bilateral (Chronic)    ===================================  HPI:    Zachary Murray is a morbidly obese 57 y.o. male with DM-2, HTN, HLD, PAD, OSA, and Bilateral Lower Extremity Venous Reflux along with Hypothyroidism and Hypogonadism who is being seen today for the evaluation of DYSPNEA ON EXERTION at the request of [name], MD.  Zachary Murray was last seen on November 27, 2019 for by Dr. Ronnald Ramp for routine follow-up noting exertional dyspnea for several months.  No chest pain.  No diaphoresis.  No palpitations or edema.  EKG was read as  abnormal (on my review it looks similar to 2021-RSR' in septal leads read as cannot rule out septal MI, age-indeterminate).  Nuclear stress test ordered (but not scheduled), along with Pro-BNP and troponin levels. proBNP was 11, and troponin I was 3..  Recent Hospitalizations: ***  Reviewed  CV studies:    The following studies were reviewed today: (if available, images/films reviewed: From Epic Chart or Care Everywhere) Myoview ordered, but not yet done: Mostly venous Dopplers 03/30/2021:   Interval History:   Zachary Murray   CV Review of Symptoms (Summary): {roscv:310661}  REVIEWED OF SYSTEMS   Review of Systems  Constitutional:  Positive for malaise/fatigue.  Respiratory:  Positive for shortness of breath.   Musculoskeletal:  Positive for back pain and joint pain (Diffuse arthralgias.).  Psychiatric/Behavioral:  The patient is nervous/anxious.    I have reviewed and (if needed) personally updated the patient's problem list, medications, allergies, past medical and surgical history, social and family history.   PAST MEDICAL HISTORY   Past Medical History:  Diagnosis Date   Degenerative arthritis of left knee 11/21/2021   GAD (generalized anxiety disorder) 10/22/2017   Hyperlipidemia associated with type 2 diabetes mellitus ([place]) 11/19/2013   The 10-year ASCVD risk score Mikey Bussing DC Jr., et al., 2013) is: 5.8%    Values used to calculate the score:      Age: 62 years      Sex: Male      Is Non-Hispanic African American: No      Diabetic: Yes  Tobacco smoker: No      Systolic Blood Pressure: 353 mmHg      Is BP treated: Yes      HDL Cholesterol: 65.7 mg/dL      Total Cholesterol: 158 mg/dL   Hypertension    Hypogonadism male 02/11/2018   Hypothyroidism 12/08/2014   Insulin-requiring or dependent type II diabetes mellitus ([place]) 06/05/2022   Irritable bowel syndrome with diarrhea 12/19/2021   Obesity    OSA on CPAP    Peripheral vascular disease ([place]) 05/06/2019    Saphenofemoral venous reflux; 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   Severe obesity (BMI >= 40) ([place]) 09/15/2012    PAST SURGICAL HISTORY   Past Surgical History:  Procedure Laterality Date   BIOPSY  08/31/2018   Procedure: BIOPSY;  Surgeon: Mauri Pole, MD;  Location: [facility];  Service: Endoscopy;;   COLONOSCOPY WITH PROPOFOL N/A 11/12/2016   Procedure: COLONOSCOPY WITH PROPOFOL;  Surgeon: Mauri Pole, MD;  Location: [facility];  Service: Endoscopy;  Laterality: N/A;   COLONOSCOPY WITH PROPOFOL N/A 08/31/2018   Procedure: COLONOSCOPY WITH PROPOFOL;  Surgeon: Mauri Pole, MD;  Location: [facility];  Service: Endoscopy;  Laterality: N/A;   POLYPECTOMY  08/31/2018   Procedure: POLYPECTOMY;  Surgeon: Mauri Pole, MD;  Location: [facility];  Service: Endoscopy;;   WISDOM TOOTH EXTRACTION      Immunization History  Administered Date(s) Administered   HPV 9-valent 12/29/2019, 02/28/2020, 08/29/2020   Influenza,inj,Quad PF,6+ Mos 10/26/2015, 08/16/2016, 10/02/2017, 07/24/2018, 08/21/2019   Influenza-Unspecified 07/21/2020, 07/30/2021, 08/01/2022   PFIZER(Purple Top)SARS-COV-2 Vaccination 01/26/2020, 02/16/2020, 08/02/2020, 03/09/2021, 07/30/2021   Pneumococcal Conjugate-13 08/29/2020   Pneumococcal Polysaccharide-23 11/19/2013, 04/23/2022   Tdap 09/16/2012   Zoster Recombinat (Shingrix) 03/09/2021, 05/25/2021    MEDICATIONS/ALLERGIES   No outpatient medications have been marked as taking for the 12/16/22 encounter (Appointment) with Leonie Man, MD.    Allergies  Allergen Reactions   Metformin And Related Diarrhea   Wellbutrin [Bupropion] Other (See Comments)    tremors   Mounjaro [Tirzepatide] Rash   Lisinopril Cough    SOCIAL HISTORY/FAMILY HISTORY   Reviewed in Epic:   Social History   Tobacco Use   Smoking status: Never   Smokeless tobacco: Former    Types: Nurse, children's Use: Never used  Substance Use Topics   Alcohol use:  No    Alcohol/week: 0.0 standard drinks of alcohol    Comment: hx of alcohol abuse - quit 2017   Drug use: No   Social History   Social History Narrative   Not on file   Family History  Problem Relation Age of Onset   Colon cancer Father    Hypertension Father    Thyroid cancer Mother    Colon polyps Mother    Thyroid disease Mother    Thyroid disease Sister    Thyroid disease Maternal Grandmother    Alcohol abuse Neg Hx    COPD Neg Hx    Diabetes Neg Hx    Early death Neg Hx    Hearing loss Neg Hx    Heart disease Neg Hx    Hyperlipidemia Neg Hx    Kidney disease Neg Hx    Stroke Neg Hx    Esophageal cancer Neg Hx     OBJCTIVE -PE, EKG, labs   Wt Readings from Last 3 Encounters:  11/26/22 (!) 343 lb (155.6 kg)  10/22/22 (!) 343 lb 2 oz (155.6 kg)  09/12/22 (!) 346 lb 6.4 oz (157.1  kg)    Physical Exam: There were no vitals taken for this visit. Physical Exam   Adult ECG Report  Rate: *** ;  Rhythm: {rhythm:17366};   Narrative Interpretation: ***  Recent Labs:  ***  Lab Results  Component Value Date   CHOL 140 11/26/2022   HDL 36.70 (L) 11/26/2022   LDLCALC 86 11/26/2022   TRIG 85.0 11/26/2022   CHOLHDL 4 11/26/2022   Lab Results  Component Value Date   CREATININE 0.88 09/10/2022   BUN 17 09/10/2022   NA 141 09/10/2022   K 4.5 09/10/2022   CL 104 09/10/2022   CO2 32 09/10/2022      Latest Ref Rng & Units 09/10/2022   10:08 AM 07/30/2022    8:52 AM 04/23/2022   10:47 AM  CBC  WBC 4.0 - 10.5 K/uL 5.3  6.1  6.4   Hemoglobin 13.0 - 17.0 g/dL 15.9  15.8  16.2   Hematocrit 39.0 - 52.0 % 47.1  46.2  47.6   Platelets 150.0 - 400.0 K/uL 196.0  177.0  209.0     Lab Results  Component Value Date   HGBA1C 8.8 (H) 09/10/2022   Lab Results  Component Value Date   TSH 2.33 11/26/2022    ================================================== I spent a total of *** minutes with the patient spent in direct patient consultation.  Additional time spent  with chart review  / charting (studies, outside notes, etc): *** min Total Time: *** min  Current medicines are reviewed at length with the patient today.  (+/- concerns) ***  Notice: This dictation was prepared with Dragon dictation along with smart phrase technology. Any transcriptional errors that result from this process are unintentional and may not be corrected upon review.   Studies Ordered:  No orders of the defined types were placed in this encounter.  No orders of the defined types were placed in this encounter.   Patient Instructions / Medication Changes & Studies & Tests Ordered   There are no Patient Instructions on file for this visit.    Leonie Man, MD, MS Glenetta Hew, M.D., M.S. Interventional Cardiologist  [place]  Pager # [phone] Phone # [phone] [address] [place], [place] [address]   Thank you for choosing [facility]!!

## 2022-12-16 ENCOUNTER — Ambulatory Visit: Payer: Medicaid Other | Attending: Cardiology | Admitting: Cardiology

## 2022-12-16 ENCOUNTER — Other Ambulatory Visit: Payer: BC Managed Care – PPO

## 2022-12-16 ENCOUNTER — Encounter: Payer: Self-pay | Admitting: Cardiology

## 2022-12-16 VITALS — BP 110/80 | HR 91 | Ht 74.0 in | Wt 340.6 lb

## 2022-12-16 DIAGNOSIS — I872 Venous insufficiency (chronic) (peripheral): Secondary | ICD-10-CM

## 2022-12-16 DIAGNOSIS — I739 Peripheral vascular disease, unspecified: Secondary | ICD-10-CM | POA: Diagnosis not present

## 2022-12-16 DIAGNOSIS — G4733 Obstructive sleep apnea (adult) (pediatric): Secondary | ICD-10-CM

## 2022-12-16 DIAGNOSIS — I10 Essential (primary) hypertension: Secondary | ICD-10-CM | POA: Diagnosis not present

## 2022-12-16 DIAGNOSIS — R0609 Other forms of dyspnea: Secondary | ICD-10-CM

## 2022-12-16 DIAGNOSIS — R9431 Abnormal electrocardiogram [ECG] [EKG]: Secondary | ICD-10-CM

## 2022-12-16 NOTE — Patient Instructions (Addendum)
Medication Instructions:  No changes   *If you need a refill on your cardiac medications before your next appointment, please call your pharmacy*   Lab Work: Not needed    Testing/Procedures:  Will cancel the Myoview  per recommendation of Dr Ellyn Hack.  Will be schedule at [place] has requested that you have an echocardiogram. Echocardiography is a painless test that uses sound waves to create images of your heart. It provides your doctor with information about the size and shape of your heart and how well your heart's chambers and valves are working. This procedure takes approximately one hour. There are no restrictions for this procedure. Please do NOT wear cologne, perfume, aftershave, or lotions (deodorant is allowed). Please arrive 15 minutes prior to your appointment time.   Follow-Up: At [facility], you and your health needs are our priority.  As part of our continuing mission to provide you with exceptional heart care, we have created designated Provider Care Teams.  These Care Teams include your primary Cardiologist (physician) and Advanced Practice Providers (APPs -  Physician Assistants and Nurse Practitioners) who all work together to provide you with the care you need, when you need it.     Your next appointment:   6 month(s)  The format for your next appointment:   In Person  Provider:   Glenetta Hew, MD

## 2022-12-17 ENCOUNTER — Encounter: Payer: Self-pay | Admitting: Cardiology

## 2022-12-17 ENCOUNTER — Other Ambulatory Visit: Payer: BC Managed Care – PPO

## 2022-12-17 NOTE — Assessment & Plan Note (Signed)
He is on Maxide with well-controlled pressures. Is probably more for edema than for BP

## 2022-12-17 NOTE — Assessment & Plan Note (Signed)
He decided against ablation procedures -> I did explain to him that given the saphenous veins are dilated, there would not be good conduits for CABG or other vascular bypass in the future if necessary because of the dilated veins are not very useful.  Since he is not really having significant symptoms from them, he chose to hold off.

## 2022-12-17 NOTE — Assessment & Plan Note (Signed)
Not really having claudication I think his leg pain is probably more related to venous stasis, reflux more so than true PAD.  Lower extremity arterial Dopplers had normal ABIs.  Remains on cilostazol.

## 2022-12-17 NOTE — Assessment & Plan Note (Signed)
Most likely this is the etiology for his dyspnea. He is quite sedentary and deconditioned.  The point of doing any evaluation at this point would be to give him peace of mind to feel safe trying to exert himself with exercise  Discussed about dietary modification  .

## 2022-12-17 NOTE — Assessment & Plan Note (Signed)
Doing well with CPAP with nasal pillows.  Been having dyspnea we will check 2D echo to assess pulmonary pressures and RV function.

## 2022-12-20 NOTE — Telephone Encounter (Signed)
Accessed chart to sign the encounter.

## 2022-12-24 ENCOUNTER — Other Ambulatory Visit (INDEPENDENT_AMBULATORY_CARE_PROVIDER_SITE_OTHER): Payer: Medicaid Other

## 2022-12-24 DIAGNOSIS — E230 Hypopituitarism: Secondary | ICD-10-CM

## 2022-12-24 DIAGNOSIS — Z794 Long term (current) use of insulin: Secondary | ICD-10-CM

## 2022-12-24 DIAGNOSIS — E1165 Type 2 diabetes mellitus with hyperglycemia: Secondary | ICD-10-CM | POA: Diagnosis not present

## 2022-12-24 LAB — CBC
HCT: 50.1 % (ref 39.0–52.0)
Hemoglobin: 17.2 g/dL — ABNORMAL HIGH (ref 13.0–17.0)
MCHC: 34.4 g/dL (ref 30.0–36.0)
MCV: 88.7 fl (ref 78.0–100.0)
Platelets: 216 10*3/uL (ref 150.0–400.0)
RBC: 5.65 Mil/uL (ref 4.22–5.81)
RDW: 14.1 % (ref 11.5–15.5)
WBC: 7.1 10*3/uL (ref 4.0–10.5)

## 2022-12-24 LAB — TESTOSTERONE: Testosterone: 480.88 ng/dL (ref 300.00–890.00)

## 2022-12-24 LAB — BASIC METABOLIC PANEL
BUN: 13 mg/dL (ref 6–23)
CO2: 29 mEq/L (ref 19–32)
Calcium: 9.5 mg/dL (ref 8.4–10.5)
Chloride: 100 mEq/L (ref 96–112)
Creatinine, Ser: 0.91 mg/dL (ref 0.40–1.50)
GFR: 92.54 mL/min (ref >60.00)
Glucose, Bld: 138 mg/dL — ABNORMAL HIGH (ref 70–99)
Potassium: 3.8 mEq/L (ref 3.5–5.1)
Sodium: 140 mEq/L (ref 135–145)

## 2022-12-24 LAB — MICROALBUMIN / CREATININE URINE RATIO
Creatinine,U: 174 mg/dL
Microalb Creat Ratio: 2.1 mg/g (ref 0.0–30.0)
Microalb, Ur: 3.6 mg/dL — ABNORMAL HIGH (ref 0.0–1.9)

## 2022-12-24 LAB — HEMOGLOBIN A1C: Hgb A1c MFr Bld: 7.1 % — ABNORMAL HIGH (ref 4.6–6.5)

## 2022-12-26 ENCOUNTER — Ambulatory Visit (INDEPENDENT_AMBULATORY_CARE_PROVIDER_SITE_OTHER): Payer: Medicaid Other | Admitting: Endocrinology

## 2022-12-26 ENCOUNTER — Encounter: Payer: Self-pay | Admitting: Endocrinology

## 2022-12-26 VITALS — BP 128/76 | HR 85 | Ht 74.0 in | Wt 346.2 lb

## 2022-12-26 DIAGNOSIS — E1165 Type 2 diabetes mellitus with hyperglycemia: Secondary | ICD-10-CM

## 2022-12-26 DIAGNOSIS — E230 Hypopituitarism: Secondary | ICD-10-CM | POA: Diagnosis not present

## 2022-12-26 DIAGNOSIS — E039 Hypothyroidism, unspecified: Secondary | ICD-10-CM | POA: Diagnosis not present

## 2022-12-26 NOTE — Progress Notes (Signed)
Patient ID: Zachary Murray, male   DOB: 08-Jun-1966, 57 y.o.   MRN: [id number]              Chief complaint: Endocrinology follow-up  History of Present Illness  PROBLEM 1  Hypogonadism was first diagnosed in 11/18  Since about 2016 or so he has had complaints of fatigue He also feels malaise and listless, mostly in the afternoons and did not feel like doing anything at that time Also has some decreased libido  The diagnosis was confirmed by the low free T level of 6.6 done in 5/19 [facility] was inappropriately low at 6.3 indicating SECONDARY hypogonadism-  Initially tried on CLOMIPHENE 25 mg 3 times a week With this his energy level and libido were improving but only partially This is despite his testosterone level being as high as 661, his LH had gone up to 20  Because of the lack of adequate symptomatic improvement he is also taking AndroGel since 9/19  With testosterone gel he was feeling significantly better with his energy level overall However because of his hemoglobin going up to 18.3 in 03/2019 he was told to reduce the dose to 4 pumps daily He was  tried on [place] but he could not continue this because of cost  RECENT HISTORY:  In 2021 while on AndroGel he had requested trying clomiphene because of fatigue and he felt it had helped his fatigue and libido Since his LH level had gone up to 19, AndroGel was added and clomiphene was reduced to twice a week Has been taking 1 pump on each arm of 1.60% generic ANDROGEL daily as prescribed regularly  He has been taking his clomiphene twice a week in the evenings consistently Does not complain of any unusual fatigue recently  His testosterone levels have been consistently normal  His hemoglobin is slightly higher but he has not donated blood for a few months now  Testosterone level history:  Lab Results  Component Value Date   TESTOSTERONE 480.88 12/24/2022   TESTOSTERONE 492.65 09/10/2022   TESTOSTERONE 511.47 07/30/2022    TESTOSTERONE 403.16 11/21/2021    Prolactin level: 5.8  Lab Results  Component Value Date   LH 19.08 (H) 06/07/2020   Lab Results  Component Value Date   HGB 17.2 (H) 12/24/2022    PROBLEM 2: DIABETES:  Diagnosis date: ?  2016  Previous history:  Although he was diagnosed to have diabetes in 2016 he was not able to tolerate metformin that was previously prescribed Also was tried on [place] in 2017 and 2018 with some improved control He was tried on Bydureon in 06/2017 and continued until mil 3/19 d He was started on Ozempic and Tresiba insulin in 01/2018 by his PCP when his A1c was 7.8 and fasting glucose 165 Insulin was stopped in 5/19 since his blood sugar was averaging only 92 on his freestyle sensor at home  Recent history:     His most recent A1c is 7.1, previously was 8.8 %,   Current management: TRESIBA 20-30 units daily  He has again been taking Ozempic 1 mg regularly since 10/28/2022 He did stop his [place]  Previously was taking [place] but now he appears to be taking it only in the last 4 to 5 days when his blood sugar seems to be rising after meals, not clear if his documentation on his [place] is accurate With this he has tendency to blood sugars as low as 54 although he thinks this is not accurate on his  sensor Postprandial readings have been as high as 275  Again cannot exercise much and has difficulty losing weight Lab glucose 138 fasting       Monitors blood glucose:   One Touch Verio meter         Freestyle libre version 3 shows the following interpretation for the last 2 weeks download  Blood sugars are mildly increased at all times with some variability His lowest readings are early morning around 5 AM and highest readings generally after breakfast and dinner Blood sugars are somewhat variable and show periodically higher readings at all times including at night However fasting blood sugars may be as low as 92 He has a tendency to low normal or  slightly low readings sporadically in the afternoons or evenings and less overnight On an average postprandial readings are not much higher compared to [place] readings which are usually averaging about 150-160  Postprandial readings are averaging about 170-180 GMI 7.2  CGM use % of time 77  2-week average/GV 161/25  Time in range 66  % Time Above 180 33  % Time above 250   % Time Below 70 0      Previously  CGM use % of time   2-week average/GV 148  Time in range       82 %  % Time Above 180 18  % Time above 250   % Time Below 70      PRE-MEAL Fasting Lunch Dinner Bedtime Overall  Glucose range:       Averages: 132    148   POST-MEAL PC Breakfast PC Lunch PC Dinner  Glucose range:     Averages: 141  162   Previous glucose data: FASTING 225, 246 Afternoon and evening readings 232-382 with overall average 291  Side effects from medications:  Diarrhea from metformin         Meals:  Usually low-fat                    Dietician visit:   Never  Weight control:  Wt Readings from Last 3 Encounters:  12/26/22 (!) 346 lb 3.2 oz (157 kg)  12/16/22 (!) 340 lb 9.6 oz (154.5 kg)  11/26/22 (!) 343 lb (155.6 kg)   Lab Results  Component Value Date   HGBA1C 7.1 (H) 12/24/2022   HGBA1C 8.8 (H) 09/10/2022   HGBA1C 8.0 (H) 04/23/2022   Lab Results  Component Value Date   MICROALBUR 3.6 (H) 12/24/2022   LDLCALC 86 11/26/2022   CREATININE 0.91 12/24/2022    HYPOTHYROIDISM:  He was previously given levothyroxine for high TSH in 12/16 but later stopped because of improved TSH  Because of his fatigue he had been started on levothyroxine in 3/19 with only slight improvement in his fatigue  He has been taking 75 mcg of levothyroxine and has been on the same dose since July 2019  TSH and free T4 consistently normal as below           Lab Results  Component Value Date   TSH 2.33 11/26/2022   TSH 2.82 04/23/2022   TSH 2.16 08/06/2021   FREET4 1.13 07/30/2022    FREET4 1.29 11/21/2021   FREET4 0.96 03/06/2021         Allergies as of 12/26/2022       Reactions   Metformin And Related Diarrhea   Wellbutrin [bupropion] Other (See Comments)   tremors   Mounjaro [tirzepatide] Rash   Lisinopril Cough  Medication List        Accurate as of December 26, 2022  9:24 AM. If you have any questions, ask your nurse or doctor.          ALPRAZolam 0.5 MG tablet Commonly known as: XANAX TAKE 1 TABLET(0.5 MG) BY MOUTH THREE TIMES DAILY AS NEEDED FOR ANXIETY   cilostazol 100 MG tablet Commonly known as: PLETAL Take 1 tablet (100 mg total) by mouth 2 (two) times daily.   clomiPHENE 50 MG tablet Commonly known as: CLOMID Half tablet at bedtime 2 days a week   dapagliflozin propanediol 10 MG Tabs tablet Commonly known as: Farxiga Take 1 tablet (10 mg total) by mouth daily before breakfast.   FreeStyle Libre 3 Sensor Misc 1 Device by Does not apply route every 14 (fourteen) days. Apply 1 sensor on upper arm every 14 days for continuous glucose monitoring   levothyroxine 75 MCG tablet Commonly known as: SYNTHROID Take 1 tablet (75 mcg total) by mouth daily before breakfast.   meloxicam 7.5 MG tablet Commonly known as: MOBIC Take 1 tablet (7.5 mg total) by mouth daily.   OneTouch Delica Plus [id number] Misc Use to check blood sugar once a day   OneTouch Verio Flex System w/Device Kit Use as instructed to check blood sugar once daily.   primidone 50 MG tablet Commonly known as: MYSOLINE Take 1 tablet (50 mg total) by mouth at bedtime.   propranolol 40 MG tablet Commonly known as: INDERAL Take 1 tablet (40 mg total) by mouth 3 (three) times daily.   ReliOn Pen Needles 31G X 6 MM Misc Generic drug: Insulin Pen Needle Inject 1 Act into the skin once a week. Use as needed for insulin injection.   rosuvastatin 10 MG tablet Commonly known as: CRESTOR Take 1 tablet (10 mg total) by mouth daily.   Semaglutide (1 MG/DOSE) 4 MG/3ML  Sopn Inject 1 mg as directed once a week.   Testosterone 1.62 % Gel Apply 1 pump on each upper arm daily   Tresiba FlexTouch 100 UNIT/ML FlexTouch Pen Generic drug: insulin degludec Inject 30 Units into the skin daily.   triamterene-hydrochlorothiazide 37.5-25 MG tablet Commonly known as: MAXZIDE-25 Take 1 tablet by mouth daily.        Allergies:  Allergies  Allergen Reactions   Metformin And Related Diarrhea   Wellbutrin [Bupropion] Other (See Comments)    tremors   Mounjaro [Tirzepatide] Rash   Lisinopril Cough    Past Medical History:  Diagnosis Date   Degenerative arthritis of left knee 11/21/2021   GAD (generalized anxiety disorder) 10/22/2017   Hyperlipidemia associated with type 2 diabetes mellitus ([place]) 11/19/2013   The 10-year ASCVD risk score Mikey Bussing DC Jr., et al., 2013) is: 5.8%    Values used to calculate the score:      Age: 42 years      Sex: Male      Is Non-Hispanic African American: No      Diabetic: Yes      Tobacco smoker: No      Systolic Blood Pressure: 638 mmHg      Is BP treated: Yes      HDL Cholesterol: 65.7 mg/dL      Total Cholesterol: 158 mg/dL   Hypertension    Hypogonadism male 02/11/2018   Hypothyroidism 12/08/2014   Insulin-requiring or dependent type II diabetes mellitus ([place]) 06/05/2022   Irritable bowel syndrome with diarrhea 12/19/2021   Obesity    OSA on CPAP  Peripheral vascular disease ([place]) 05/06/2019   Saphenofemoral venous reflux; 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   Severe obesity (BMI >= 40) ([place]) 09/15/2012    Past Surgical History:  Procedure Laterality Date   BIOPSY  08/31/2018   Procedure: BIOPSY;  Surgeon: Mauri Pole, MD;  Location: [facility];  Service: Endoscopy;;   COLONOSCOPY WITH PROPOFOL N/A 11/12/2016   Procedure: COLONOSCOPY WITH PROPOFOL;  Surgeon: Mauri Pole, MD;  Location: [facility];  Service: Endoscopy;  Laterality: N/A;   COLONOSCOPY WITH PROPOFOL N/A 08/31/2018   Procedure: COLONOSCOPY WITH  PROPOFOL;  Surgeon: Mauri Pole, MD;  Location: [facility];  Service: Endoscopy;  Laterality: N/A;   POLYPECTOMY  08/31/2018   Procedure: POLYPECTOMY;  Surgeon: Mauri Pole, MD;  Location: [facility];  Service: Endoscopy;;   WISDOM TOOTH EXTRACTION      Family History  Problem Relation Age of Onset   Colon cancer Father    Hypertension Father    Thyroid cancer Mother    Colon polyps Mother    Thyroid disease Mother    Thyroid disease Sister    Thyroid disease Maternal Grandmother    Alcohol abuse Neg Hx    COPD Neg Hx    Diabetes Neg Hx    Early death Neg Hx    Hearing loss Neg Hx    Heart disease Neg Hx    Hyperlipidemia Neg Hx    Kidney disease Neg Hx    Stroke Neg Hx    Esophageal cancer Neg Hx     Social History:  reports that he has never smoked. He has quit using smokeless tobacco.  His smokeless tobacco use included chew. He reports that he does not drink alcohol and does not use drugs.  Review of Systems    LIPIDS: LDL previously below 100, currently not on a statin drug  He felt that Crestor made his diabetes worse, not clear if he had any side effects from Lipitor in 2019  Followed by PCP  Lab Results  Component Value Date   CHOL 140 11/26/2022   CHOL 144 08/06/2021   CHOL 121 12/27/2019   Lab Results  Component Value Date   HDL 36.70 (L) 11/26/2022   HDL 37.50 (L) 08/06/2021   HDL 48.70 12/27/2019   Lab Results  Component Value Date   LDLCALC 86 11/26/2022   LDLCALC 92 08/06/2021   LDLCALC 63 12/27/2019   Lab Results  Component Value Date   TRIG 85.0 11/26/2022   TRIG 74.0 08/06/2021   TRIG 48.0 12/27/2019   Lab Results  Component Value Date   CHOLHDL 4 11/26/2022   CHOLHDL 4 08/06/2021   CHOLHDL 2 12/27/2019   No results found for: "LDLDIRECT"  BP Readings from Last 3 Encounters:  12/26/22 128/76  12/16/22 110/80  11/26/22 128/82     General Examination:   BP 128/76 (BP Location: Left Arm, Patient Position:  Sitting, Cuff Size: Normal)   Pulse 85   Ht '6\' 2"'$  (1.88 m)   Wt (!) 346 lb 3.2 oz (157 kg)   SpO2 93%   BMI 44.45 kg/m   Assessment/ Plan: 10/23   HYPOGONADOTROPIC HYPOGONADISM with symptoms of fatigue and low free testosterone at baseline  At baseline was symptomatic with fatigue and decreased libido  Currently on clomiphene 2 days a week with AndroGel 1 pump on each arm daily Symptomatically doing fairly well considering multiple comorbid conditions Testosterone level is again excellent in the upper 400 range Previously also in the 400-500 range  He has mild increase in hemoglobin and will need to his blood donation again and do this at least every 3 months  DIABETES with morbid obesity  Currently back on Ozempic but also using basal insulin or insulin  Blood sugars are fair with GMI 7.2 on the sensor and A1c improved at 7.1  However his freestyle libre tends to be lower than the actual readings periodically especially when in the low range However discussed that since he is having low normal readings only when taking insulin he needs to stop this and restart [place] with his Ozempic This will help with long-term weight loss and cardiovascular benefits Encouraged him to be as active as possible  Hypothyroidism: Adequately controlled  Since he is requesting his PCP to take over his diabetes and endocrine management will defer this and come back as needed   There are no Patient Instructions on file for this visit.  Elayne Snare 12/26/2022, 9:24 AM      Note: This office note was prepared with Dragon voice recognition system technology. Any transcriptional errors that result from this process are unintentional.     Elayne Snare 12/26/2022, 9:24 AM

## 2022-12-26 NOTE — Patient Instructions (Signed)
Farxiga instead of insulin

## 2022-12-31 ENCOUNTER — Encounter: Payer: Self-pay | Admitting: Endocrinology

## 2022-12-31 ENCOUNTER — Other Ambulatory Visit: Payer: Self-pay | Admitting: Internal Medicine

## 2022-12-31 ENCOUNTER — Telehealth: Payer: Self-pay

## 2022-12-31 NOTE — Progress Notes (Signed)
   12/31/2022  Patient ID: Zachary Murray, male   DOB: [birth year], 57 y.o.   MRN: [id number]  Subjective/Objective: Follow-up phone call after recent lab work and endocrinology appointment.  Patient still endorses frequent low readings with [facility] sensor.  Endocrinology discontinued insulin and restarted Farxiga at this month's appointment.  Dr. Ronnald Ramp will also be managing DM moving forward per endo note/patient request.  A1c was 7.1 this month from 8.8 in October 2023.  Assessment/Plan: -Libre sensor fell off, so patient has not worn or had BG values for 3 days.  He does have a new sensor to put on. Educated patient to make sure to start wearing, so Dr. Ronnald Ramp can determine efficacy of current DM regimen. -Verified there were no access or affordability barriers to getting Farxiga, and there were none.  Patient stopped insulin and started [place] after endo visit 12/26/22.  Follow-up Plan: None needed at this time, but I have sent patient a MyChart message with my contact information if needs arise.  Darlina Guys, PharmD, DPLA

## 2023-01-03 ENCOUNTER — Other Ambulatory Visit (HOSPITAL_COMMUNITY): Payer: Medicaid Other

## 2023-01-16 ENCOUNTER — Ambulatory Visit (HOSPITAL_COMMUNITY): Payer: Medicaid Other | Attending: Cardiology

## 2023-01-16 DIAGNOSIS — R0609 Other forms of dyspnea: Secondary | ICD-10-CM | POA: Diagnosis not present

## 2023-01-16 DIAGNOSIS — G4733 Obstructive sleep apnea (adult) (pediatric): Secondary | ICD-10-CM | POA: Diagnosis not present

## 2023-01-16 LAB — ECHOCARDIOGRAM COMPLETE
Area-P 1/2: 3.24 cm2
S' Lateral: 3.2 cm

## 2023-01-17 ENCOUNTER — Encounter: Payer: Self-pay | Admitting: Internal Medicine

## 2023-01-20 ENCOUNTER — Ambulatory Visit: Payer: Medicaid Other | Admitting: Internal Medicine

## 2023-01-20 ENCOUNTER — Encounter: Payer: Self-pay | Admitting: Internal Medicine

## 2023-01-20 VITALS — BP 128/78 | HR 83 | Temp 97.9°F | Resp 16 | Ht 74.0 in | Wt 337.0 lb

## 2023-01-20 DIAGNOSIS — I10 Essential (primary) hypertension: Secondary | ICD-10-CM

## 2023-01-20 DIAGNOSIS — E118 Type 2 diabetes mellitus with unspecified complications: Secondary | ICD-10-CM | POA: Diagnosis not present

## 2023-01-20 MED ORDER — DAPAGLIFLOZIN PROPANEDIOL 10 MG PO TABS: 10.0000 mg | ORAL_TABLET | Freq: Every day | ORAL | 1 refills | Status: DC

## 2023-01-20 NOTE — Progress Notes (Unsigned)
Subjective:  Patient ID: Zachary Murray, male    DOB: Oct 03, 1966  Age: 57 y.o. MRN: [id number]  CC: No chief complaint on file.   HPI SMARAN BEE presents for ***  Outpatient Medications Prior to Visit  Medication Sig Dispense Refill   ALPRAZolam (XANAX) 0.5 MG tablet TAKE 1 TABLET(0.5 MG) BY MOUTH THREE TIMES DAILY AS NEEDED FOR ANXIETY 270 tablet 1   Blood Glucose Monitoring Suppl ([place]) w/Device KIT Use as instructed to check blood sugar once daily. 1 kit 0   cilostazol (PLETAL) 100 MG tablet Take 1 tablet (100 mg total) by mouth 2 (two) times daily. 180 tablet 3   clomiPHENE (CLOMID) 50 MG tablet Half tablet at bedtime 2 days a week 20 tablet 1   Continuous Blood Gluc Sensor (FREESTYLE LIBRE 3 SENSOR) MISC APPLY 1 SENSOR ON UPPER ARM EVERY 14 DAYS FOR CONTINUOUS GLUCOSE MONITORING 2 each 2   Insulin Pen Needle (RELION PEN NEEDLES) 31G X 6 MM MISC Inject 1 Act into the skin once a week. Use as needed for insulin injection. 30 each 2   Lancets (ONETOUCH DELICA PLUS 123XX123) MISC Use to check blood sugar once a day 100 each 1   levothyroxine (SYNTHROID) 75 MCG tablet Take 1 tablet (75 mcg total) by mouth daily before breakfast. 90 tablet 1   meloxicam (MOBIC) 7.5 MG tablet Take 1 tablet (7.5 mg total) by mouth daily. 90 tablet 0   primidone (MYSOLINE) 50 MG tablet Take 1 tablet (50 mg total) by mouth at bedtime. 90 tablet 3   propranolol (INDERAL) 40 MG tablet Take 1 tablet (40 mg total) by mouth 3 (three) times daily. 270 tablet 1   rosuvastatin (CRESTOR) 10 MG tablet Take 1 tablet (10 mg total) by mouth daily. 90 tablet 1   Semaglutide, 1 MG/DOSE, 4 MG/3ML SOPN Inject 1 mg as directed once a week. 3 mL 2   Testosterone 1.62 % GEL Apply 1 pump on each upper arm daily 75 g 5   triamterene-hydrochlorothiazide (MAXZIDE-25) 37.5-25 MG tablet Take 1 tablet by mouth daily. 90 tablet 1   dapagliflozin propanediol (FARXIGA) 10 MG TABS tablet Take 1 tablet (10 mg total) by  mouth daily before breakfast. 30 tablet 2   No facility-administered medications prior to visit.    ROS Review of Systems  Objective:  BP 114/70 (BP Location: Left Arm, Patient Position: Sitting, Cuff Size: Large)   Pulse 83   Temp 97.9 F (36.6 C) (Oral)   Ht '6\' 2"'$  (1.88 m)   Wt (!) 337 lb (152.9 kg)   SpO2 96%   BMI 43.27 kg/m   BP Readings from Last 3 Encounters:  01/20/23 114/70  12/26/22 128/76  12/16/22 110/80    Wt Readings from Last 3 Encounters:  01/20/23 (!) 337 lb (152.9 kg)  12/26/22 (!) 346 lb 3.2 oz (157 kg)  12/16/22 (!) 340 lb 9.6 oz (154.5 kg)    Physical Exam  Lab Results  Component Value Date   WBC 7.1 12/24/2022   HGB 17.2 (H) 12/24/2022   HCT 50.1 12/24/2022   PLT 216.0 12/24/2022   GLUCOSE 138 (H) 12/24/2022   CHOL 140 11/26/2022   TRIG 85.0 11/26/2022   HDL 36.70 (L) 11/26/2022   LDLCALC 86 11/26/2022   ALT 17 07/30/2022   AST 16 07/30/2022   NA 140 12/24/2022   K 3.8 12/24/2022   CL 100 12/24/2022   CREATININE 0.91 12/24/2022   BUN 13 12/24/2022  CO2 29 12/24/2022   TSH 2.33 11/26/2022   PSA 2.11 11/26/2022   INR 1.2 (H) 12/18/2021   HGBA1C 7.1 (H) 12/24/2022   MICROALBUR 3.6 (H) 12/24/2022    VAS [place] LOWER EXTREMITY VENOUS REFLUX  Result Date: 03/30/2021  Lower Venous Reflux Study Patient Name:  SATORU BRILLA  Date of Exam:   03/30/2021 Medical Rec #: [id number]     Accession #:    [id number] Date of Birth: [birth year]     Patient Gender: M Patient Age:   55Y Exam Location:  [facility] Procedure:      VAS [place] LOWER EXTREMITY VENOUS REFLUX Referring Phys: [id number] [place] Dom CAIN --------------------------------------------------------------------------------  Indications: Pain, Swelling, and varicosities.  Comparison Study: 02/27/2021: No evidence of deep vein thrombosis bilaterally.                   Age indeterminate venous thrombosis in the bilateral greater                   saphenous veins. Performing  Technologist: Ivan Croft  Examination Guidelines: A complete evaluation includes B-mode imaging, spectral Doppler, color Doppler, and power Doppler as needed of all accessible portions of each vessel. Bilateral testing is considered an integral part of a complete examination. Limited examinations for reoccurring indications may be performed as noted. The reflux portion of the exam is performed with the patient in reverse Trendelenburg. Significant venous reflux is defined as >500 ms in the superficial venous system, and >1 second in the deep venous system.  Venous Reflux Times +----------------------+---------+------+-----------+------------+-------------+ RIGHT                 Reflux NoRefluxReflux TimeDiameter cmsComments                                      Yes                                       +----------------------+---------+------+-----------+------------+-------------+ CFV                   no                                                  +----------------------+---------+------+-----------+------------+-------------+ FV mid                no                                                  +----------------------+---------+------+-----------+------------+-------------+ Popliteal             no                                                  +----------------------+---------+------+-----------+------------+-------------+ GSV at SFJ                      yes    >  500 ms      0.67                  +----------------------+---------+------+-----------+------------+-------------+ GSV prox thigh                  yes    >500 ms      0.68                  +----------------------+---------+------+-----------+------------+-------------+ GSV mid thigh                   yes    >500 ms      0.45                  +----------------------+---------+------+-----------+------------+-------------+ GSV dist thigh                  yes    >500 ms      0.39                   +----------------------+---------+------+-----------+------------+-------------+ GSV at knee                     yes    >500 ms      0.41    out of fascia +----------------------+---------+------+-----------+------------+-------------+ GSV prox calf                   yes    >500 ms      0.48    out of fascia +----------------------+---------+------+-----------+------------+-------------+ SSV Pop Fossa         no                            0.49                  +----------------------+---------+------+-----------+------------+-------------+ SSV prox calf                   yes    >500 ms      0.39                  +----------------------+---------+------+-----------+------------+-------------+ SSV mid calf          no                            0.25                  +----------------------+---------+------+-----------+------------+-------------+ Distal calf perforator          yes    >500 ms                            +----------------------+---------+------+-----------+------------+-------------+  +-------------------+---------+------+-----------+------------+--------+ LEFT               Reflux NoRefluxReflux TimeDiameter cmsComments                              Yes                                  +-------------------+---------+------+-----------+------------+--------+ CFV                          yes   >1 second                      +-------------------+---------+------+-----------+------------+--------+  FV mid             no                                             +-------------------+---------+------+-----------+------------+--------+ Popliteal          no                                             +-------------------+---------+------+-----------+------------+--------+ GSV at SFJ                   yes    >500 ms      0.96             +-------------------+---------+------+-----------+------------+--------+ GSV  prox thigh               yes    >500 ms      0.63             +-------------------+---------+------+-----------+------------+--------+ GSV mid thigh                yes    >500 ms      0.46             +-------------------+---------+------+-----------+------------+--------+ GSV dist thigh               yes    >500 ms      0.47             +-------------------+---------+------+-----------+------------+--------+ GSV at knee                  yes    >500 ms      0.55             +-------------------+---------+------+-----------+------------+--------+ GSV prox calf                yes    >500 ms      0.51             +-------------------+---------+------+-----------+------------+--------+ SSV Pop Fossa      no                            0.56             +-------------------+---------+------+-----------+------------+--------+ SSV prox calf      no                            0.41             +-------------------+---------+------+-----------+------------+--------+ SSV mid calf       no                            0.33             +-------------------+---------+------+-----------+------------+--------+ Mid calf perforator          yes    >500 ms                       +-------------------+---------+------+-----------+------------+--------+   Summary: Bilateral: - No evidence of deep vein thrombosis seen in the lower extremities, bilaterally, from the common femoral through the popliteal veins.  Right: - Color duplex  evaluation of the right lower extremity shows there is thrombus in the distal greater saphenous vein. - Venous reflux is noted in the right sapheno-femoral junction. - Venous reflux is noted in the right greater saphenous vein in the thigh. - Venous reflux is noted in the right greater saphenous vein in the calf. - Venous reflux is noted in the right short saphenous vein. - Venous reflux is noted in the right calf perforator vein.  Left: - Color duplex  evaluation of the left lower extremity shows there is thrombus in the distal greater saphenous vein and proximal greater saphenous vein. - Venous reflux is noted in the left common femoral vein. - Venous reflux is noted in the left sapheno-femoral junction. - Venous reflux is noted in the left greater saphenous vein in the thigh. - Venous reflux is noted in the left greater saphenous vein in the calf. - Venous reflux is noted in the left calf perforator vein.  *See table(s) above for measurements and observations. Electronically signed by Servando Snare MD on 03/30/2021 at 5:04:43 PM.    Final     Assessment & Plan:   Diagnoses and all orders for this visit:  Type II diabetes mellitus with manifestations ([place]) -     dapagliflozin propanediol (FARXIGA) 10 MG TABS tablet; Take 1 tablet (10 mg total) by mouth daily before breakfast.   I am having Aaron Mose. Requejo "Yvone Neu" maintain his OneTouch Verio Flex System, OneTouch Delica Plus 0000000, clomiPHENE, Semaglutide (1 MG/DOSE), ReliOn Pen Needles, Testosterone, propranolol, primidone, triamterene-hydrochlorothiazide, cilostazol, levothyroxine, ALPRAZolam, meloxicam, rosuvastatin, FreeStyle Libre 3 Sensor, and dapagliflozin propanediol.  Meds ordered this encounter  Medications   dapagliflozin propanediol (FARXIGA) 10 MG TABS tablet    Sig: Take 1 tablet (10 mg total) by mouth daily before breakfast.    Dispense:  90 tablet    Refill:  1     Follow-up: No follow-ups on file.  Scarlette Calico, MD

## 2023-01-20 NOTE — Patient Instructions (Signed)

## 2023-01-24 ENCOUNTER — Other Ambulatory Visit: Payer: Self-pay

## 2023-01-24 MED ORDER — SEMAGLUTIDE (1 MG/DOSE) 4 MG/3ML ~~LOC~~ SOPN: 1.0000 mg | PEN_INJECTOR | SUBCUTANEOUS | 3 refills | Status: DC

## 2023-04-14 ENCOUNTER — Other Ambulatory Visit: Payer: Self-pay | Admitting: Emergency Medicine

## 2023-04-14 DIAGNOSIS — G250 Essential tremor: Secondary | ICD-10-CM

## 2023-04-21 ENCOUNTER — Ambulatory Visit: Payer: Medicaid Other | Admitting: Internal Medicine

## 2023-04-21 ENCOUNTER — Encounter: Payer: Self-pay | Admitting: Internal Medicine

## 2023-04-21 VITALS — BP 118/68 | HR 87 | Temp 98.0°F | Resp 16 | Ht 74.0 in | Wt 332.0 lb

## 2023-04-21 DIAGNOSIS — E291 Testicular hypofunction: Secondary | ICD-10-CM

## 2023-04-21 DIAGNOSIS — Z794 Long term (current) use of insulin: Secondary | ICD-10-CM | POA: Diagnosis not present

## 2023-04-21 DIAGNOSIS — T502X5A Adverse effect of carbonic-anhydrase inhibitors, benzothiadiazides and other diuretics, initial encounter: Secondary | ICD-10-CM

## 2023-04-21 DIAGNOSIS — E118 Type 2 diabetes mellitus with unspecified complications: Secondary | ICD-10-CM | POA: Diagnosis not present

## 2023-04-21 DIAGNOSIS — I10 Essential (primary) hypertension: Secondary | ICD-10-CM | POA: Diagnosis not present

## 2023-04-21 DIAGNOSIS — G4733 Obstructive sleep apnea (adult) (pediatric): Secondary | ICD-10-CM

## 2023-04-21 DIAGNOSIS — E119 Type 2 diabetes mellitus without complications: Secondary | ICD-10-CM | POA: Diagnosis not present

## 2023-04-21 DIAGNOSIS — E876 Hypokalemia: Secondary | ICD-10-CM | POA: Diagnosis not present

## 2023-04-21 DIAGNOSIS — E039 Hypothyroidism, unspecified: Secondary | ICD-10-CM

## 2023-04-21 LAB — BASIC METABOLIC PANEL
BUN: 16 mg/dL (ref 6–23)
CO2: 30 mEq/L (ref 19–32)
Calcium: 9.5 mg/dL (ref 8.4–10.5)
Chloride: 100 mEq/L (ref 96–112)
Creatinine, Ser: 0.92 mg/dL (ref 0.40–1.50)
GFR: 91.13 mL/min (ref >60.00)
Glucose, Bld: 133 mg/dL — ABNORMAL HIGH (ref 70–99)
Potassium: 3.3 mEq/L — ABNORMAL LOW (ref 3.5–5.1)
Sodium: 141 mEq/L (ref 135–145)

## 2023-04-21 LAB — CBC WITH DIFFERENTIAL/PLATELET
Basophils Absolute: 0 10*3/uL (ref 0.0–0.1)
Basophils Relative: 0.6 % (ref 0.0–3.0)
Eosinophils Absolute: 0.1 10*3/uL (ref 0.0–0.7)
Eosinophils Relative: 2.1 % (ref 0.0–5.0)
HCT: 47.3 % (ref 39.0–52.0)
Hemoglobin: 16.1 g/dL (ref 13.0–17.0)
Lymphocytes Relative: 16.8 % (ref 12.0–46.0)
Lymphs Abs: 1 10*3/uL (ref 0.7–4.0)
MCHC: 34 g/dL (ref 30.0–36.0)
MCV: 87.8 fl (ref 78.0–100.0)
Monocytes Absolute: 0.8 10*3/uL (ref 0.1–1.0)
Monocytes Relative: 12.5 % — ABNORMAL HIGH (ref 3.0–12.0)
Neutro Abs: 4.2 10*3/uL (ref 1.4–7.7)
Neutrophils Relative %: 68 % (ref 43.0–77.0)
Platelets: 215 10*3/uL (ref 150.0–400.0)
RBC: 5.38 Mil/uL (ref 4.22–5.81)
RDW: 14 % (ref 11.5–15.5)
WBC: 6.2 10*3/uL (ref 4.0–10.5)

## 2023-04-21 LAB — HEMOGLOBIN A1C: Hgb A1c MFr Bld: 6.8 % — ABNORMAL HIGH (ref 4.6–6.5)

## 2023-04-21 LAB — TSH: TSH: 2.09 u[IU]/mL (ref 0.35–5.50)

## 2023-04-21 NOTE — Patient Instructions (Signed)

## 2023-04-21 NOTE — Progress Notes (Signed)
Subjective:  Patient ID: Zachary Murray, male    DOB: 1966-05-05  Age: 57 y.o. MRN: [id number]  CC: Hypertension, Hypothyroidism, and Diabetes   HPI JULIOUS MEINECKE presents for f/up ---  He denies chest pain, shortness of breath, dizziness, lightheadedness, diaphoresis, or edema.  Outpatient Medications Prior to Visit  Medication Sig Dispense Refill   ALPRAZolam (XANAX) 0.5 MG tablet TAKE 1 TABLET(0.5 MG) BY MOUTH THREE TIMES DAILY AS NEEDED FOR ANXIETY 270 tablet 1   Blood Glucose Monitoring Suppl (ONETOUCH VERIO FLEX SYSTEM) w/Device KIT Use as instructed to check blood sugar once daily. 1 kit 0   cilostazol (PLETAL) 100 MG tablet Take 1 tablet (100 mg total) by mouth 2 (two) times daily. 180 tablet 3   dapagliflozin propanediol (FARXIGA) 10 MG TABS tablet Take 1 tablet (10 mg total) by mouth daily before breakfast. 90 tablet 1   Insulin Pen Needle (RELION PEN NEEDLES) 31G X 6 MM MISC Inject 1 Act into the skin once a week. Use as needed for insulin injection. 30 each 2   Lancets (ONETOUCH DELICA PLUS LANCET33G) MISC Use to check blood sugar once a day 100 each 1   meloxicam (MOBIC) 7.5 MG tablet Take 1 tablet (7.5 mg total) by mouth daily. 90 tablet 0   primidone (MYSOLINE) 50 MG tablet Take 1 tablet (50 mg total) by mouth at bedtime. 90 tablet 3   propranolol (INDERAL) 40 MG tablet TAKE 1 TABLET(40 MG) BY MOUTH THREE TIMES DAILY 270 tablet 1   rosuvastatin (CRESTOR) 10 MG tablet Take 1 tablet (10 mg total) by mouth daily. 90 tablet 1   Semaglutide, 1 MG/DOSE, 4 MG/3ML SOPN Inject 1 mg as directed once a week. 6 mL 3   clomiPHENE (CLOMID) 50 MG tablet Half tablet at bedtime 2 days a week 20 tablet 1   Continuous Blood Gluc Sensor (FREESTYLE LIBRE 3 SENSOR) MISC APPLY 1 SENSOR ON UPPER ARM EVERY 14 DAYS FOR CONTINUOUS GLUCOSE MONITORING 2 each 2   levothyroxine (SYNTHROID) 75 MCG tablet Take 1 tablet (75 mcg total) by mouth daily before breakfast. 90 tablet 1   Testosterone 1.62 % GEL  Apply 1 pump on each upper arm daily 75 g 5   triamterene-hydrochlorothiazide (MAXZIDE-25) 37.5-25 MG tablet Take 1 tablet by mouth daily. 90 tablet 1   No facility-administered medications prior to visit.    ROS Review of Systems  Constitutional: Negative.  Negative for diaphoresis and fatigue.  HENT: Negative.    Eyes: Negative.   Respiratory:  Positive for apnea. Negative for cough, chest tightness, shortness of breath and wheezing.   Cardiovascular:  Negative for chest pain, palpitations and leg swelling.  Gastrointestinal:  Negative for abdominal pain, constipation, diarrhea, nausea and vomiting.  Endocrine: Negative.   Genitourinary: Negative.  Negative for difficulty urinating and dysuria.  Musculoskeletal:  Positive for arthralgias. Negative for myalgias.  Neurological:  Negative for dizziness, weakness, light-headedness and numbness.  Hematological:  Negative for adenopathy. Does not bruise/bleed easily.  Psychiatric/Behavioral: Negative.      Objective:  BP 118/68 (BP Location: Left Arm, Patient Position: Sitting, Cuff Size: Large)   Pulse 87   Temp 98 F (36.7 C) (Oral)   Resp 16   Ht 6\' 2"  (1.88 m)   Wt (!) 332 lb (150.6 kg)   SpO2 91%   BMI 42.63 kg/m   BP Readings from Last 3 Encounters:  04/21/23 118/68  01/20/23 128/78  12/26/22 128/76    Wt Readings from Last  3 Encounters:  04/21/23 (!) 332 lb (150.6 kg)  01/20/23 (!) 337 lb (152.9 kg)  12/26/22 (!) 346 lb 3.2 oz (157 kg)    Physical Exam Vitals reviewed.  Constitutional:      Appearance: He is not ill-appearing.  HENT:     Mouth/Throat:     Mouth: Mucous membranes are moist.  Eyes:     General: No scleral icterus.    Conjunctiva/sclera: Conjunctivae normal.  Cardiovascular:     Rate and Rhythm: Normal rate and regular rhythm.     Heart sounds: No murmur heard. Pulmonary:     Effort: Pulmonary effort is normal.     Breath sounds: No stridor. No wheezing, rhonchi or rales.  Abdominal:      General: Abdomen is flat.     Palpations: There is no mass.     Tenderness: There is no abdominal tenderness. There is no guarding.     Hernia: No hernia is present.  Musculoskeletal:        General: Normal range of motion.     Cervical back: Neck supple.     Right lower leg: No edema.     Left lower leg: No edema.  Lymphadenopathy:     Cervical: No cervical adenopathy.  Skin:    General: Skin is warm and dry.  Neurological:     General: No focal deficit present.     Mental Status: He is alert.  Psychiatric:        Mood and Affect: Mood normal.        Behavior: Behavior normal.     Lab Results  Component Value Date   WBC 6.2 04/21/2023   HGB 16.1 04/21/2023   HCT 47.3 04/21/2023   PLT 215.0 04/21/2023   GLUCOSE 133 (H) 04/21/2023   CHOL 140 11/26/2022   TRIG 85.0 11/26/2022   HDL 36.70 (L) 11/26/2022   LDLCALC 86 11/26/2022   ALT 17 07/30/2022   AST 16 07/30/2022   NA 141 04/21/2023   K 3.3 (L) 04/21/2023   CL 100 04/21/2023   CREATININE 0.92 04/21/2023   BUN 16 04/21/2023   CO2 30 04/21/2023   TSH 2.09 04/21/2023   PSA 2.11 11/26/2022   INR 1.2 (H) 12/18/2021   HGBA1C 6.8 (H) 04/21/2023   MICROALBUR 3.6 (H) 12/24/2022    VAS [place] LOWER EXTREMITY VENOUS REFLUX  Result Date: 03/30/2021  Lower Venous Reflux Study Patient Name:  AMERICUS PERKEY  Date of Exam:   03/30/2021 Medical Rec #: [id number]     Accession #:    [id number] Date of Birth: May 28, 1966     Patient Gender: M Patient Age:   55Y Exam Location:  [facility] Procedure:      VAS [place] LOWER EXTREMITY VENOUS REFLUX Referring Phys: [id number] Dennard Schaumann CAIN --------------------------------------------------------------------------------  Indications: Pain, Swelling, and varicosities.  Comparison Study: 02/27/2021: No evidence of deep vein thrombosis bilaterally.                   Age indeterminate venous thrombosis in the bilateral greater                   saphenous veins. Performing  Technologist: Ethelle Lyon  Examination Guidelines: A complete evaluation includes B-mode imaging, spectral Doppler, color Doppler, and power Doppler as needed of all accessible portions of each vessel. Bilateral testing is considered an integral part of a complete examination. Limited examinations for reoccurring indications may be performed as noted. The reflux portion  of the exam is performed with the patient in reverse Trendelenburg. Significant venous reflux is defined as >500 ms in the superficial venous system, and >1 second in the deep venous system.  Venous Reflux Times +----------------------+---------+------+-----------+------------+-------------+ RIGHT                 Reflux NoRefluxReflux TimeDiameter cmsComments                                      Yes                                       +----------------------+---------+------+-----------+------------+-------------+ CFV                   no                                                  +----------------------+---------+------+-----------+------------+-------------+ FV mid                no                                                  +----------------------+---------+------+-----------+------------+-------------+ Popliteal             no                                                  +----------------------+---------+------+-----------+------------+-------------+ GSV at SFJ                      yes    >500 ms      0.67                  +----------------------+---------+------+-----------+------------+-------------+ GSV prox thigh                  yes    >500 ms      0.68                  +----------------------+---------+------+-----------+------------+-------------+ GSV mid thigh                   yes    >500 ms      0.45                  +----------------------+---------+------+-----------+------------+-------------+ GSV dist thigh                  yes    >500 ms      0.39                   +----------------------+---------+------+-----------+------------+-------------+ GSV at knee                     yes    >500 ms      0.41    out of fascia +----------------------+---------+------+-----------+------------+-------------+ GSV prox calf  yes    >500 ms      0.48    out of fascia +----------------------+---------+------+-----------+------------+-------------+ SSV Pop Fossa         no                            0.49                  +----------------------+---------+------+-----------+------------+-------------+ SSV prox calf                   yes    >500 ms      0.39                  +----------------------+---------+------+-----------+------------+-------------+ SSV mid calf          no                            0.25                  +----------------------+---------+------+-----------+------------+-------------+ Distal calf perforator          yes    >500 ms                            +----------------------+---------+------+-----------+------------+-------------+  +-------------------+---------+------+-----------+------------+--------+ LEFT               Reflux NoRefluxReflux TimeDiameter cmsComments                              Yes                                  +-------------------+---------+------+-----------+------------+--------+ CFV                          yes   >1 second                      +-------------------+---------+------+-----------+------------+--------+ FV mid             no                                             +-------------------+---------+------+-----------+------------+--------+ Popliteal          no                                             +-------------------+---------+------+-----------+------------+--------+ GSV at SFJ                   yes    >500 ms      0.96             +-------------------+---------+------+-----------+------------+--------+ GSV  prox thigh               yes    >500 ms      0.63             +-------------------+---------+------+-----------+------------+--------+ GSV mid thigh                yes    >500 ms  0.46             +-------------------+---------+------+-----------+------------+--------+ GSV dist thigh               yes    >500 ms      0.47             +-------------------+---------+------+-----------+------------+--------+ GSV at knee                  yes    >500 ms      0.55             +-------------------+---------+------+-----------+------------+--------+ GSV prox calf                yes    >500 ms      0.51             +-------------------+---------+------+-----------+------------+--------+ SSV Pop Fossa      no                            0.56             +-------------------+---------+------+-----------+------------+--------+ SSV prox calf      no                            0.41             +-------------------+---------+------+-----------+------------+--------+ SSV mid calf       no                            0.33             +-------------------+---------+------+-----------+------------+--------+ Mid calf perforator          yes    >500 ms                       +-------------------+---------+------+-----------+------------+--------+   Summary: Bilateral: - No evidence of deep vein thrombosis seen in the lower extremities, bilaterally, from the common femoral through the popliteal veins.  Right: - Color duplex evaluation of the right lower extremity shows there is thrombus in the distal greater saphenous vein. - Venous reflux is noted in the right sapheno-femoral junction. - Venous reflux is noted in the right greater saphenous vein in the thigh. - Venous reflux is noted in the right greater saphenous vein in the calf. - Venous reflux is noted in the right short saphenous vein. - Venous reflux is noted in the right calf perforator vein.  Left: - Color duplex  evaluation of the left lower extremity shows there is thrombus in the distal greater saphenous vein and proximal greater saphenous vein. - Venous reflux is noted in the left common femoral vein. - Venous reflux is noted in the left sapheno-femoral junction. - Venous reflux is noted in the left greater saphenous vein in the thigh. - Venous reflux is noted in the left greater saphenous vein in the calf. - Venous reflux is noted in the left calf perforator vein.  *See table(s) above for measurements and observations. Electronically signed by Lemar Livings MD on 03/30/2021 at 5:04:43 PM.    Final     Assessment & Plan:   Essential hypertension, benign- His blood pressure is well-controlled. -     Basic metabolic panel; Future -     CBC with Differential/Platelet; Future -     TSH; Future -     Triamterene-HCTZ;  Take 1 tablet by mouth daily.  Dispense: 90 tablet; Refill: 1 -     Potassium Chloride ER; Take 1 tablet (10 mEq total) by mouth 2 (two) times daily.  Dispense: 180 tablet; Refill: 0  Acquired hypothyroidism- He is euthyroid. -     TSH; Future -     Levothyroxine Sodium; Take 1 tablet (75 mcg total) by mouth daily before breakfast.  Dispense: 90 tablet; Refill: 1  Type II diabetes mellitus with manifestations (HCC) -     Basic metabolic panel; Future -     Hemoglobin A1c; Future  OSA on CPAP -     Ambulatory referral to Pulmonology  Insulin-requiring or dependent type II diabetes mellitus (HCC)- His blood sugar is adequately well-controlled. -     FreeStyle Libre 3 Sensor; APPLY 1 SENSOR ON UPPER ARM EVERY 14 DAYS FOR CONTINUOUS GLUCOSE MONITORING  Dispense: 2 each; Refill: 5  Diuretic-induced hypokalemia -     Potassium Chloride ER; Take 1 tablet (10 mEq total) by mouth 2 (two) times daily.  Dispense: 180 tablet; Refill: 0  Hypogonadism male -     Testosterone; Place 1 Act onto the skin daily. Apply 1 pump on each upper arm daily  Dispense: 75 g; Refill: 3     Follow-up: Return in  about 6 months (around 10/21/2023).  Sanda Linger, MD

## 2023-04-22 ENCOUNTER — Other Ambulatory Visit: Payer: Self-pay | Admitting: Internal Medicine

## 2023-04-22 MED ORDER — POTASSIUM CHLORIDE ER 10 MEQ PO TBCR
10.0000 meq | EXTENDED_RELEASE_TABLET | Freq: Two times a day (BID) | ORAL | 0 refills | Status: DC
Start: 2023-04-22 — End: 2023-07-28

## 2023-04-22 MED ORDER — LEVOTHYROXINE SODIUM 75 MCG PO TABS: 75.0000 ug | ORAL_TABLET | Freq: Every day | ORAL | 1 refills | Status: DC

## 2023-04-22 MED ORDER — TRIAMTERENE-HCTZ 37.5-25 MG PO TABS
1.0000 | ORAL_TABLET | Freq: Every day | ORAL | 1 refills | Status: DC
Start: 2023-04-22 — End: 2023-10-20

## 2023-04-22 MED ORDER — FREESTYLE LIBRE 3 SENSOR MISC: 5 refills | Status: DC

## 2023-04-23 ENCOUNTER — Encounter: Payer: Self-pay | Admitting: Internal Medicine

## 2023-04-23 MED ORDER — TESTOSTERONE 1.62 % TD GEL
1.0000 | Freq: Every day | TRANSDERMAL | 3 refills | Status: AC
Start: 2023-04-23 — End: ?

## 2023-05-20 ENCOUNTER — Other Ambulatory Visit: Payer: Self-pay | Admitting: Internal Medicine

## 2023-05-20 ENCOUNTER — Encounter: Payer: Self-pay | Admitting: Internal Medicine

## 2023-05-20 DIAGNOSIS — E039 Hypothyroidism, unspecified: Secondary | ICD-10-CM

## 2023-05-20 DIAGNOSIS — M5416 Radiculopathy, lumbar region: Secondary | ICD-10-CM

## 2023-05-20 DIAGNOSIS — E785 Hyperlipidemia, unspecified: Secondary | ICD-10-CM

## 2023-05-20 DIAGNOSIS — I739 Peripheral vascular disease, unspecified: Secondary | ICD-10-CM

## 2023-05-20 DIAGNOSIS — F411 Generalized anxiety disorder: Secondary | ICD-10-CM

## 2023-05-20 DIAGNOSIS — E118 Type 2 diabetes mellitus with unspecified complications: Secondary | ICD-10-CM

## 2023-05-20 DIAGNOSIS — M170 Bilateral primary osteoarthritis of knee: Secondary | ICD-10-CM

## 2023-05-20 MED ORDER — DAPAGLIFLOZIN PROPANEDIOL 10 MG PO TABS
10.0000 mg | ORAL_TABLET | Freq: Every day | ORAL | 1 refills | Status: DC
Start: 2023-05-20 — End: 2023-10-24

## 2023-05-20 MED ORDER — LEVOTHYROXINE SODIUM 75 MCG PO TABS
75.0000 ug | ORAL_TABLET | Freq: Every day | ORAL | 1 refills | Status: DC
Start: 2023-05-20 — End: 2024-01-20

## 2023-05-20 MED ORDER — ROSUVASTATIN CALCIUM 10 MG PO TABS: 10.0000 mg | ORAL_TABLET | Freq: Every day | ORAL | 1 refills | Status: DC

## 2023-05-20 MED ORDER — MELOXICAM 7.5 MG PO TABS
7.5000 mg | ORAL_TABLET | Freq: Every day | ORAL | 0 refills | Status: AC
Start: 2023-05-20 — End: ?

## 2023-05-20 MED ORDER — ALPRAZOLAM 0.5 MG PO TABS: ORAL_TABLET | ORAL | 1 refills | Status: DC

## 2023-05-20 MED ORDER — CILOSTAZOL 100 MG PO TABS
100.0000 mg | ORAL_TABLET | Freq: Two times a day (BID) | ORAL | 1 refills | Status: AC
Start: 2023-05-20 — End: 2023-11-16

## 2023-05-21 ENCOUNTER — Other Ambulatory Visit: Payer: Self-pay | Admitting: Internal Medicine

## 2023-05-21 ENCOUNTER — Encounter: Payer: Self-pay | Admitting: Internal Medicine

## 2023-05-21 DIAGNOSIS — K580 Irritable bowel syndrome with diarrhea: Secondary | ICD-10-CM

## 2023-05-21 MED ORDER — RIFAXIMIN 550 MG PO TABS
550.0000 mg | ORAL_TABLET | Freq: Three times a day (TID) | ORAL | 0 refills | Status: AC
Start: 2023-05-21 — End: 2023-06-04

## 2023-05-23 ENCOUNTER — Encounter: Payer: Self-pay | Admitting: Cardiology

## 2023-05-27 NOTE — Telephone Encounter (Signed)
Called spoke to patient . Patient decided to keep appointment.  RN informed patient Dr Herbie Baltimore would discuss last Echo report.

## 2023-05-29 ENCOUNTER — Other Ambulatory Visit (HOSPITAL_COMMUNITY): Payer: Self-pay

## 2023-05-29 ENCOUNTER — Encounter: Payer: Self-pay | Admitting: Internal Medicine

## 2023-05-29 ENCOUNTER — Telehealth: Payer: Self-pay

## 2023-05-29 NOTE — Telephone Encounter (Signed)
Pharmacy Patient Advocate Encounter   Prior authorization for Xifaxan 550MG  tablets submitted and APPROVED. Test billing returns $4 copay for 14 day supply.  Key [id number] Effective: 05/29/23 - 05/28/24

## 2023-05-30 ENCOUNTER — Encounter: Payer: Self-pay | Admitting: Cardiology

## 2023-05-30 ENCOUNTER — Ambulatory Visit: Payer: Medicaid Other | Attending: Cardiology | Admitting: Cardiology

## 2023-05-30 VITALS — BP 124/62 | HR 89 | Ht 72.0 in | Wt 326.0 lb

## 2023-05-30 DIAGNOSIS — I872 Venous insufficiency (chronic) (peripheral): Secondary | ICD-10-CM

## 2023-05-30 DIAGNOSIS — I10 Essential (primary) hypertension: Secondary | ICD-10-CM

## 2023-05-30 DIAGNOSIS — E1169 Type 2 diabetes mellitus with other specified complication: Secondary | ICD-10-CM

## 2023-05-30 DIAGNOSIS — I739 Peripheral vascular disease, unspecified: Secondary | ICD-10-CM | POA: Diagnosis not present

## 2023-05-30 DIAGNOSIS — G4733 Obstructive sleep apnea (adult) (pediatric): Secondary | ICD-10-CM | POA: Diagnosis not present

## 2023-05-30 DIAGNOSIS — E785 Hyperlipidemia, unspecified: Secondary | ICD-10-CM | POA: Diagnosis not present

## 2023-05-30 MED ORDER — ASPIRIN 81 MG PO TBEC: 81.0000 mg | DELAYED_RELEASE_TABLET | Freq: Every day | ORAL | Status: AC

## 2023-05-30 NOTE — Assessment & Plan Note (Addendum)
Tolerating CPAP.  Doing well.  No evidence of pulmonary hypertension.

## 2023-05-30 NOTE — Patient Instructions (Addendum)
Medication Instructions:    Recommend   enteric Aspirin 81 mg  daily  if  you are giving platelets you can hold Aspirin for 7-9 days prior.   *If you need a refill on your cardiac medications before your next appointment, please call your pharmacy*   Lab Work: Not needed    Testing/Procedures: Not needed   Follow-Up: At [facility], you and your health needs are our priority.  As part of our continuing mission to provide you with exceptional heart care, we have created designated Provider Care Teams.  These Care Teams include your primary Cardiologist (physician) and Advanced Practice Providers (APPs -  Physician Assistants and Nurse Practitioners) who all work together to provide you with the care you need, when you need it.     Your next appointment:   As needed     The format for your next appointment:   In Person  Provider:   Bryan Lemma, MD

## 2023-05-30 NOTE — Assessment & Plan Note (Signed)
In the past, he decided against ablation.  Recommend support stockings and foot elevation.  With concern for possible PFO, would suggest starting 81 mg aspirin for referral axis.  Of course this can be held 5 to 7 days for preop for surgeries or also for donating plasma/platelets.

## 2023-05-30 NOTE — Assessment & Plan Note (Signed)
On Maxide and propranolol, neither 1 of which was actually placed for blood pressure.

## 2023-05-30 NOTE — Progress Notes (Signed)
Cardiology Office Note:  .   Date:  05/30/2023  ID:  Zachary Murray, DOB 1966-05-28, MRN [id number] PCP: Etta Grandchild, MD  [facility] Providers Cardiologist:  Bryan Lemma, MD     Chief Complaint  Patient presents with   Follow-up    Discussed test results.  Doing well.  Has lost weight.    History of Present Illness: .     Zachary Murray is a morbidly obese 57 y.o. male  with a PMH notable for DM-2, HTN, HLD,, OSA on CPAP (nasal pillows), BLE Venous Reflux (with edema; suspect peripheral venous and not peripheral arterial disease based on ABIs), hypothyroidism and hypogonadism who presents here for 6 follow-up at the request of Etta Grandchild, MD.  Zachary Murray was first seen on December 16, 2022 at the request of Dr. Yetta Barre in response to a "abnormal EKG and exertional dyspnea.  No chest pain or pressure just 1 nonexertional episode of right-sided pain.  He was not overly concerned, not overly symptomatic.  Longstanding extremity swelling with venous reflux and varicose veins he chose to avoid vein ablation.  But also was not usually wearing support stockings.  No PND orthopnea.  Noted malaise, dyspnea but also back and joint pain/diffuse arthralgias and anxiety. => 2D echo ordered with plan to follow-up in 6 months.  We canceled Myoview.    Subjective  INTERVAL HISTORY Zachary Murray returns today overall doing pretty well.  No major issues.  He has started to become more active with more exercise of walking.  He says the combination of the Ozempic and Marcelline Deist has really made a difference he is started to lose weight at least pounds down from earlier this year.  He says his energy level doing better.  He besides having a little lightheadedness and grogginess associated with taking primidone, he really has no major issues. Stable from a cardiac standpoint with no active symptoms of chest tightness pressure pain or dyspnea with rest or exertion.  No PND, orthopnea to go along with his  mild swelling related to his venous stasis.  He does try to elevate his feet but is not very good about wearing support stockings.  We discussed this briefly.  No TIA or CVA or amaurosis fugax symptoms.  No significant headaches or blurred vision-just probably needs new glasses.  I might take care of No signs symptoms of arrhythmia.   ROS:   Review of Systems - Negative except essential tremor which is relatively well-controlled on current meds but the primidone makes him feel somewhat lethargic and weak/groggy.  He has his varicose veins which are stable not really swelling per se just engorged varicose veins.  He has lightheadedness that is somewhat positional but no syncope or near syncope.    Objective   Studies Reviewed: Marland Kitchen       ECHO 01/16/2023: Normal LV size and function.  EF 60 to 65%.  No RWMA.  Normal diastolic parameters.  Normal RV size and function but unable assess RV P.  Normal RAP.  CRO small PFO.  Mild AoV calcification but no stenosis.  No AI or MR.  Ascending aorta measured at 40 mm.  Upper limit normal for age and BSA.  Lower Extremity Venous Dopplers from 03/30/2021 showed venous reflux noted in the left CFV, and thrombus in the distal GSV.  Reflux noted bilateral saphenofemoral junction, GSV in the thigh and calf and bilateral calf perforator veins.  Risk Assessment/Calculations:  Physical Exam:   VS:  BP 124/62 (BP Location: Left Arm, Patient Position: Sitting, Cuff Size: Large)   Pulse 89   Ht 6' (1.829 m)   Wt (!) 326 lb (147.9 kg)   SpO2 96%   BMI 44.21 kg/m    Wt Readings from Last 3 Encounters:  05/30/23 (!) 326 lb (147.9 kg)  04/21/23 (!) 332 lb (150.6 kg)  01/20/23 (!) 337 lb (152.9 kg)    GEN: Well nourished, well developed in no acute distress; obese NECK: No JVD; No carotid bruits CARDIAC: Distant heart sounds with normal S1, S2; RRR, no murmurs, rubs, gallops RESPIRATORY:  Clear to auscultation bilaterally..  No rales, wheezing or  rhonchi ; nonlabored, good air movement. ABDOMEN: Soft, non-tender, non-distended EXTREMITIES:  No edema; No deformity; pulses diminished due to body habitus; engorged varicose veins but no true edema.  No significant venous stasis dermatitis, just discolored spider/varicose veins..    ASSESSMENT AND PLAN: .    Problem List Items Addressed This Visit       Cardiology Problems   Peripheral vascular disease (HCC) - Primary (Chronic)    Not really having claudication.  Symptoms actually sound more related to radiculopathy from LBP.  He also has some discomfort in his legs from varicose veins and venous stasis.  LE Dopplers were relatively normal with normal ABIs.      Relevant Medications   aspirin EC 81 MG tablet   Hyperlipidemia associated with type 2 diabetes mellitus (HCC) (Chronic)    Started on rosuvastatin by PCP.  Last labs in January 2020 showed LDL of 96.  Overall pretty well-controlled.  A1c down to 6.8 Addition of [place].  Tolerating meds well.  Now started to lose weight.  Stressed importance of staying hydrated as he is having some dizziness associated with primidone.      Relevant Medications   aspirin EC 81 MG tablet   Essential hypertension, benign (Chronic)    On Maxide and propranolol, neither 1 of which was actually placed for blood pressure.      Relevant Medications   aspirin EC 81 MG tablet     Other   Severe obesity (BMI >= 40) (HCC) (Chronic)    Doing relatively well now on losing some weight.  More active.  Continue to encourage diet and modifications and increase exercise.      Saphenofemoral venous reflux; bilateral (Chronic)    In the past, he decided against ablation.  Recommend support stockings and foot elevation.  With concern for possible PFO, would suggest starting 81 mg aspirin for referral axis.  Of course this can be held 5 to 7 days for preop for surgeries or also for donating plasma/platelets.      OSA on CPAP  (Chronic)    Tolerating CPAP.  Doing well.  No evidence of pulmonary hypertension.            Dispo: Return if symptoms worsen or fail to improve.  Total time spent: 25 min spent with patient + 13 min spent charting = 38 min      Signed, Marykay Lex, MD, MS Bryan Lemma, M.D., M.S. Interventional Cardiologist  [facility]  Pager # [phone] Phone # [phone] [address] [place], [place] [address]

## 2023-05-30 NOTE — Assessment & Plan Note (Addendum)
Started on rosuvastatin by PCP.  Last labs in January 2020 showed LDL of 96.  Overall pretty well-controlled.  A1c down to 6.8 Addition of [place].  Tolerating meds well.  Now started to lose weight.  Stressed importance of staying hydrated as he is having some dizziness associated with primidone.

## 2023-05-30 NOTE — Assessment & Plan Note (Signed)
Doing relatively well now on losing some weight.  More active.  Continue to encourage diet and modifications and increase exercise.

## 2023-05-30 NOTE — Assessment & Plan Note (Signed)
Not really having claudication.  Symptoms actually sound more related to radiculopathy from LBP.  He also has some discomfort in his legs from varicose veins and venous stasis.  LE Dopplers were relatively normal with normal ABIs.

## 2023-06-09 ENCOUNTER — Ambulatory Visit (INDEPENDENT_AMBULATORY_CARE_PROVIDER_SITE_OTHER): Payer: Medicaid Other | Admitting: Primary Care

## 2023-06-09 ENCOUNTER — Encounter: Payer: Self-pay | Admitting: Primary Care

## 2023-06-09 VITALS — BP 120/76 | HR 86 | Temp 98.4°F | Ht 72.0 in | Wt 325.6 lb

## 2023-06-09 DIAGNOSIS — G4733 Obstructive sleep apnea (adult) (pediatric): Secondary | ICD-10-CM

## 2023-06-09 NOTE — Progress Notes (Signed)
@Patient  ID: Zachary Murray, male    DOB: 11/26/1965, 57 y.o.   MRN: [id number]  Chief Complaint  Patient presents with   Consult    Severe sleep apnea dx 2005 on CPAP.    Referring provider: Etta Grandchild, MD  HPI: 57 year old male, never smoked.  Past medical history significant for hypertension, peripheral vascular disease, OSA on CPAP, irritable bowel syndrome, type 2 diabetes, hypothyroidism, essential tremor, major depressive disorder, vitamin D deficiency, severe obesity.  06/09/2023 Patient presents today for sleep consult.  Dx with sleep apnea in 2005 and has been on CPAP since. Previously followed by Dr. Shelle Iron, last saw in 2015. Sleep study on 06/10/2004 with [facility] showed severe OSA, apnea rate per hour 122, SpO2 low 69% (average baseline 92%). He is currently on CPAP auto settings 9 to 20 cm H2O. CPAP machine is approximately 69-73 years old. He uses nasal pillow mask. He does not wear oxygen. He is doing well, he sleeps through the night for the most part. Not waking up gasping or choking. He does not think he snores. Bedtime varies between 11 PM and 4 AM.  Time to fall asleep also varies, can take a long time.  He wakes up 0-1 times a night.  He starts his day between 7 and 9 AM. Some mornings he will wake up and not feel rested. He takes primidone, propranolol and xanax for tremor which can cause fatigue symptoms He does not operate heavy machinery.  His weight is down 70 pounds last two years. DME was previously [facility] but does not wish to use them again, he had issus with their billing. Epworth score 6  Airview download 05/10/23-06/08/23 Usage 30/30 days (100%); 28 days (93%)  4 hours Average usage 6 hours 5 mins Pressure 9-20cm h20 Airleaks 10.7L/min AHI 0.4   Social history:  Patient is single. Lives with his parents.  He is retired.  He does not have children.    No tobacco or alcohol use.   Allergies  Allergen Reactions    Metformin And Related Diarrhea   Wellbutrin [Bupropion] Other (See Comments)    tremors   Mounjaro [Tirzepatide] Rash   Lisinopril Cough    Immunization History  Administered Date(s) Administered   HPV 9-valent 12/29/2019, 02/28/2020, 08/29/2020   Influenza,inj,Quad PF,6+ Mos 10/26/2015, 08/16/2016, 10/02/2017, 07/24/2018, 08/21/2019   Influenza-Unspecified 07/21/2020, 07/30/2021, 08/01/2022   PFIZER(Purple Top)SARS-COV-2 Vaccination 01/26/2020, 02/16/2020, 08/02/2020, 03/09/2021, 07/30/2021   Pneumococcal Conjugate-13 08/29/2020   Pneumococcal Polysaccharide-23 11/19/2013, 04/23/2022   Tdap 09/16/2012   Zoster Recombinant(Shingrix) 03/09/2021, 05/25/2021    Past Medical History:  Diagnosis Date   Degenerative arthritis of left knee 11/21/2021   GAD (generalized anxiety disorder) 10/22/2017   Hyperlipidemia associated with type 2 diabetes mellitus (HCC) 11/19/2013   The 10-year ASCVD risk score Denman George DC Jr., et al., 2013) is: 5.8%    Values used to calculate the score:      Age: 28 years      Sex: Male      Is Non-Hispanic African American: No      Diabetic: Yes      Tobacco smoker: No      Systolic Blood Pressure: 118 mmHg      Is BP treated: Yes      HDL Cholesterol: 65.7 mg/dL      Total Cholesterol: 158 mg/dL   Hypertension    Hypogonadism male 02/11/2018   Hypothyroidism 12/08/2014   Insulin-requiring or dependent type II  diabetes mellitus (HCC) 06/05/2022   Irritable bowel syndrome with diarrhea 12/19/2021   Obesity    OSA on CPAP    Peripheral vascular disease (HCC) 05/06/2019   Saphenofemoral venous reflux; 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   Severe obesity (BMI >= 40) (HCC) 09/15/2012    Tobacco History: Social History   Tobacco Use  Smoking Status Never  Smokeless Tobacco Former   Types: Chew   Counseling given: Not Answered   Outpatient Medications Prior to Visit  Medication Sig Dispense Refill   ALPRAZolam (XANAX) 0.5 MG tablet TAKE 1 TABLET(0.5 MG) BY MOUTH THREE TIMES  DAILY AS NEEDED FOR ANXIETY 270 tablet 1   aspirin EC 81 MG tablet Take 1 tablet (81 mg total) by mouth daily. Swallow whole.     Blood Glucose Monitoring Suppl (ONETOUCH VERIO FLEX SYSTEM) w/Device KIT Use as instructed to check blood sugar once daily. 1 kit 0   cilostazol (PLETAL) 100 MG tablet Take 1 tablet (100 mg total) by mouth 2 (two) times daily. 180 tablet 1   CLOMID 50 MG tablet Take 50 mg by mouth daily.     Continuous Glucose Sensor (FREESTYLE LIBRE 3 SENSOR) MISC APPLY 1 SENSOR ON UPPER ARM EVERY 14 DAYS FOR CONTINUOUS GLUCOSE MONITORING 2 each 5   dapagliflozin propanediol (FARXIGA) 10 MG TABS tablet Take 1 tablet (10 mg total) by mouth daily before breakfast. 90 tablet 1   Insulin Pen Needle (RELION PEN NEEDLES) 31G X 6 MM MISC Inject 1 Act into the skin once a week. Use as needed for insulin injection. 30 each 2   Lancets (ONETOUCH DELICA PLUS LANCET33G) MISC Use to check blood sugar once a day 100 each 1   levothyroxine (SYNTHROID) 75 MCG tablet Take 1 tablet (75 mcg total) by mouth daily before breakfast. 90 tablet 1   meloxicam (MOBIC) 7.5 MG tablet Take 1 tablet (7.5 mg total) by mouth daily. 90 tablet 0   primidone (MYSOLINE) 50 MG tablet Take 1 tablet (50 mg total) by mouth at bedtime. 90 tablet 3   propranolol (INDERAL) 40 MG tablet TAKE 1 TABLET(40 MG) BY MOUTH THREE TIMES DAILY 270 tablet 1   rosuvastatin (CRESTOR) 10 MG tablet Take 1 tablet (10 mg total) by mouth daily. 90 tablet 1   Semaglutide, 1 MG/DOSE, 4 MG/3ML SOPN Inject 1 mg as directed once a week. 6 mL 3   Testosterone 1.62 % GEL Place 1 Act onto the skin daily. Apply 1 pump on each upper arm daily 75 g 3   triamterene-hydrochlorothiazide (MAXZIDE-25) 37.5-25 MG tablet Take 1 tablet by mouth daily. 90 tablet 1   potassium chloride (KLOR-CON 10) 10 MEQ tablet Take 1 tablet (10 mEq total) by mouth 2 (two) times daily. (Patient not taking: Reported on 06/09/2023) 180 tablet 0   No facility-administered medications  prior to visit.   Review of Systems  Review of Systems  Constitutional:  Positive for fatigue.  HENT: Negative.    Respiratory: Negative.    Cardiovascular: Negative.   Psychiatric/Behavioral:  Negative for sleep disturbance.    Physical Exam  BP 120/76 (BP Location: Right Arm, Patient Position: Sitting, Cuff Size: Large)   Pulse 86   Temp 98.4 F (36.9 C) (Oral)   Ht 6' (1.829 m)   Wt (!) 325 lb 9.6 oz (147.7 kg)   SpO2 96%   BMI 44.16 kg/m  Physical Exam Constitutional:      General: He is not in acute distress.    Appearance: Normal appearance. He is  obese. He is not ill-appearing.  HENT:     Mouth/Throat:     Mouth: Mucous membranes are moist.     Pharynx: Oropharynx is clear.  Cardiovascular:     Rate and Rhythm: Normal rate and regular rhythm.  Pulmonary:     Effort: Pulmonary effort is normal.     Breath sounds: Normal breath sounds.     Comments: CTA Musculoskeletal:        General: Normal range of motion.  Skin:    General: Skin is warm and dry.  Neurological:     General: No focal deficit present.     Mental Status: He is alert and oriented to person, place, and time. Mental status is at baseline.  Psychiatric:        Mood and Affect: Mood normal.        Behavior: Behavior normal.        Thought Content: Thought content normal.        Judgment: Judgment normal.      Lab Results:  CBC    Component Value Date/Time   WBC 6.2 04/21/2023 0828   RBC 5.38 04/21/2023 0828   HGB 16.1 04/21/2023 0828   HCT 47.3 04/21/2023 0828   PLT 215.0 04/21/2023 0828   MCV 87.8 04/21/2023 0828   MCH 31.6 08/12/2013 1849   MCHC 34.0 04/21/2023 0828   RDW 14.0 04/21/2023 0828   LYMPHSABS 1.0 04/21/2023 0828   MONOABS 0.8 04/21/2023 0828   EOSABS 0.1 04/21/2023 0828   BASOSABS 0.0 04/21/2023 0828    BMET    Component Value Date/Time   NA 141 04/21/2023 0828   K 3.3 (L) 04/21/2023 0828   CL 100 04/21/2023 0828   CO2 30 04/21/2023 0828   GLUCOSE 133 (H)  04/21/2023 0828   BUN 16 04/21/2023 0828   CREATININE 0.92 04/21/2023 0828   CALCIUM 9.5 04/21/2023 0828   GFRNONAA >90 08/12/2013 1849   GFRAA >90 08/12/2013 1849    BNP No results found for: "BNP"  ProBNP    Component Value Date/Time   PROBNP 11.0 11/26/2022 0955    Imaging: No results found.   Assessment & Plan:   OSA on CPAP - NPSG 2005>> severe OSA, AHI 125/hour  - Patient is 94% compliant with CPAP >4 hours last 30 days and reports benefit from use - Current pressure 9-20cm h20; Residual AHI 0.4/hr - No changes today - DME ordered placed for new CPAP machine at current settings - Advised patient continue to wear CPAP nightly 4 to 6 hours or longer - Encourage weight loss efforts as able  Orders: New CPAP machine 9-20cm h20 and supplies  Follow-up: 31-90 days after receiving new cpap for compliance check (can be virtual)   Glenford Bayley, NP 06/09/2023

## 2023-06-09 NOTE — Patient Instructions (Signed)
Excellent compliance with CPAP  Sleep apnea is well-controlled on current pressure setting Continue to wear CPAP nightly 4 to 6 hours or longer Work on weight loss efforts as able Do not drive if experiencing excessive daytime sleepiness or fatigue  Orders: New CPAP machine 9-20cm h20 and supplies  Follow-up: 31-90 days after receiving new cpap for compliance check (can be virtual)   CPAP and BIPAP Information CPAP and BIPAP are methods that use air pressure to keep your airways open and to help you breathe well. CPAP and BIPAP use different amounts of pressure. Your health care provider will tell you whether CPAP or BIPAP would be more helpful for you. CPAP stands for "continuous positive airway pressure." With CPAP, the amount of pressure stays the same while you breathe in (inhale) and out (exhale). BIPAP stands for "bi-level positive airway pressure." With BIPAP, the amount of pressure will be higher when you inhale and lower when you exhale. This allows you to take larger breaths. CPAP or BIPAP may be used in the hospital, or your health care provider may want you to use it at home. You may need to have a sleep study before your health care provider can order a machine for you to use at home. What are the advantages? CPAP or BIPAP can be helpful if you have: Sleep apnea. Chronic obstructive pulmonary disease (COPD). Heart failure. Medical conditions that cause muscle weakness, including muscular dystrophy or amyotrophic lateral sclerosis (ALS). Other problems that cause breathing to be shallow, weak, abnormal, or difficult. CPAP and BIPAP are most commonly used for obstructive sleep apnea (OSA) to keep the airways from collapsing when the muscles relax during sleep. What are the risks? Generally, this is a safe treatment. However, problems may occur, including: Irritated skin or skin sores if the mask does not fit properly. Dry or stuffy nose or nosebleeds. Dry mouth. Feeling gassy  or bloated. Sinus or lung infection if the equipment is not cleaned properly. When should CPAP or BIPAP be used? In most cases, the mask only needs to be worn during sleep. Generally, the mask needs to be worn throughout the night and during any daytime naps. People with certain medical conditions may also need to wear the mask at other times, such as when they are awake. Follow instructions from your health care provider about when to use the machine. What happens during CPAP or BIPAP?  Both CPAP and BIPAP are provided by a small machine with a flexible plastic tube that attaches to a plastic mask that you wear. Air is blown through the mask into your nose or mouth. The amount of pressure that is used to blow the air can be adjusted on the machine. Your health care provider will set the pressure setting and help you find the best mask for you. Tips for using the mask Because the mask needs to be snug, some people feel trapped or closed-in (claustrophobic) when first using the mask. If you feel this way, you may need to get used to the mask. One way to do this is to hold the mask loosely over your nose or mouth and then gradually apply the mask more snugly. You can also gradually increase the amount of time that you use the mask. Masks are available in various types and sizes. If your mask does not fit well, talk with your health care provider about getting a different one. Some common types of masks include: Full face masks, which fit over the mouth and  nose. Nasal masks, which fit over the nose. Nasal pillow or prong masks, which fit into the nostrils. If you are using a mask that fits over your nose and you tend to breathe through your mouth, a chin strap may be applied to help keep your mouth closed. Use a skin barrier to protect your skin as told by your health care provider. Some CPAP and BIPAP machines have alarms that may sound if the mask comes off or develops a leak. If you have trouble  with the mask, it is very important that you talk with your health care provider about finding a way to make the mask easier to tolerate. Do not stop using the mask. There could be a negative impact on your health if you stop using the mask. Tips for using the machine Place your CPAP or BIPAP machine on a secure table or stand near an electrical outlet. Know where the on/off switch is on the machine. Follow instructions from your health care provider about how to set the pressure on your machine and when you should use it. Do not eat or drink while the CPAP or BIPAP machine is on. Food or fluids could get pushed into your lungs by the pressure of the CPAP or BIPAP. For home use, CPAP and BIPAP machines can be rented or purchased through home health care companies. Many different brands of machines are available. Renting a machine before purchasing may help you find out which particular machine works well for you. Your health insurance company may also decide which machine you may get. Keep the CPAP or BIPAP machine and attachments clean. Ask your health care provider for specific instructions. Check the humidifier if you have a dry stuffy nose or nosebleeds. Make sure it is working correctly. Follow these instructions at home: Take over-the-counter and prescription medicines only as told by your health care provider. Ask if you can take sinus medicine if your sinuses are blocked. Do not use any products that contain nicotine or tobacco. These products include cigarettes, chewing tobacco, and vaping devices, such as e-cigarettes. If you need help quitting, ask your health care provider. Keep all follow-up visits. This is important. Contact a health care provider if: You have redness or pressure sores on your head, face, mouth, or nose from the mask or head gear. You have trouble using the CPAP or BIPAP machine. You cannot tolerate wearing the CPAP or BIPAP mask. Someone tells you that you snore even  when wearing your CPAP or BIPAP. Get help right away if: You have trouble breathing. You feel confused. Summary CPAP and BIPAP are methods that use air pressure to keep your airways open and to help you breathe well. If you have trouble with the mask, it is very important that you talk with your health care provider about finding a way to make the mask easier to tolerate. Do not stop using the mask. There could be a negative impact to your health if you stop using the mask. Follow instructions from your health care provider about when to use the machine. This information is not intended to replace advice given to you by your health care provider. Make sure you discuss any questions you have with your health care provider. Document Revised: 06/13/2021 Document Reviewed: 10/13/2020 Elsevier Patient Education  2023 [employer].

## 2023-06-09 NOTE — Progress Notes (Signed)
Reviewed and agree with assessment/plan.   Coralyn Helling, MD [facility] Pulmonary/Critical Care 06/09/2023, 11:19 AM Pager:  [phone]

## 2023-06-09 NOTE — Assessment & Plan Note (Addendum)
-   NPSG 2005>> severe OSA, AHI 125/hour  - Patient is 94% compliant with CPAP >4 hours last 30 days and reports benefit from use - Current pressure 9-20cm h20; Residual AHI 0.4/hr - No changes today - DME ordered placed for new CPAP machine at current settings - Advised patient continue to wear CPAP nightly 4 to 6 hours or longer - Encourage weight loss efforts as able  Orders: New CPAP machine 9-20cm h20 and supplies  Follow-up: 31-90 days after receiving new cpap for compliance check (can be virtual)

## 2023-06-12 DIAGNOSIS — G4733 Obstructive sleep apnea (adult) (pediatric): Secondary | ICD-10-CM | POA: Diagnosis not present

## 2023-07-13 DIAGNOSIS — G4733 Obstructive sleep apnea (adult) (pediatric): Secondary | ICD-10-CM | POA: Diagnosis not present

## 2023-07-22 ENCOUNTER — Other Ambulatory Visit: Payer: Self-pay | Admitting: Endocrinology

## 2023-07-22 ENCOUNTER — Encounter: Payer: Self-pay | Admitting: Internal Medicine

## 2023-07-23 ENCOUNTER — Other Ambulatory Visit: Payer: Self-pay | Admitting: Internal Medicine

## 2023-07-23 DIAGNOSIS — E291 Testicular hypofunction: Secondary | ICD-10-CM

## 2023-07-23 MED ORDER — CLOMID 50 MG PO TABS
50.0000 mg | ORAL_TABLET | Freq: Every day | ORAL | 0 refills | Status: DC
Start: 2023-07-23 — End: 2023-10-24

## 2023-07-24 ENCOUNTER — Encounter: Payer: Self-pay | Admitting: Internal Medicine

## 2023-07-28 ENCOUNTER — Telehealth (INDEPENDENT_AMBULATORY_CARE_PROVIDER_SITE_OTHER): Payer: Medicaid Other | Admitting: Primary Care

## 2023-07-28 DIAGNOSIS — G4733 Obstructive sleep apnea (adult) (pediatric): Secondary | ICD-10-CM

## 2023-07-28 NOTE — Progress Notes (Signed)
Virtual Visit via Video Note  I connected with Zachary Murray on 07/28/23 at  3:30 PM EDT by a video enabled telemedicine application and verified that I am speaking with the correct person using two identifiers.  Location: Patient: Home Provider: Office     I discussed the limitations of evaluation and management by telemedicine and the availability of in person appointments. The patient expressed understanding and agreed to proceed.  History of Present Illness: 57 year old male, never smoked.  Past medical history significant for hypertension, peripheral vascular disease, OSA on CPAP, irritable bowel syndrome, type 2 diabetes, hypothyroidism, essential tremor, major depressive disorder, vitamin D deficiency, severe obesity.  Previous [facility] pulmonary encounter:  06/09/2023 Patient presents today for sleep consult.  Dx with sleep apnea in 2005 and has been on CPAP since. Previously followed by Dr. Shelle Iron, last saw in 2015. Sleep study on 06/10/2004 with [facility] showed severe OSA, apnea rate per hour 122, SpO2 low 69% (average baseline 92%). He is currently on CPAP auto settings 9 to 20 cm H2O. CPAP machine is approximately 41-59 years old. He uses nasal pillow mask. He does not wear oxygen. He is doing well, he sleeps through the night for the most part. Not waking up gasping or choking. He does not think he snores. Bedtime varies between 11 PM and 4 AM.  Time to fall asleep also varies, can take a long time.  He wakes up 0-1 times a night.  He starts his day between 7 and 9 AM. Some mornings he will wake up and not feel rested. He takes primidone, propranolol and xanax for tremor which can cause fatigue symptoms He does not operate heavy machinery.  His weight is down 70 pounds last two years. DME was previously [facility] but does not wish to use them again, he had issus with their billing. Epworth score 6  Airview download 05/10/23-06/08/23 Usage 30/30 days (100%); 28  days (93%)  4 hours Average usage 6 hours 5 mins Pressure 9-20cm h20 Airleaks 10.7L/min AHI 0.4   Social history:  Patient is single. Lives with his parents.  He is retired.  He does not have children.    No tobacco or alcohol use.  07/28/2023- Interim hx  Patient contacted today for CPAP compliance.  He was seen in July for sleep consult.  Diagnosed with sleep apnea in 2005.  Maintained on CPAP.  He received a new CPAP machine since our last OV. He remains compliant with CPAP use and reports benefit from wearing. He is sleeping well at night. No significant residual daytime sleepiness. No issues with mask fit or pressure settings.   Airview download 06/25/23 - 07/24/2023 Average usage 30/30 days (100%) greater than 4 hours Average usage 6 hours 38 minutes Pressure 9 to 20 cm H2O (12.2 cm H2O-95%) Air leaks 5.6 L/min (95%) AHI 0.3   Observations/Objective:  Appears well without overt respiratory symptoms  Assessment and Plan:  OSA on CPAP - Severe sleep apnea. Received new CPAP in July/August 2024, he is 100% compliant with use > 4 hours last 30 days. Current pressure 9-20cm h20; Residual AHI 0.3/hour. No changes recommended.  5 patient continue her CPAP nightly for 4 to 6 hours or longer.  Encouraged weight loss efforts and side sleeping position.  Advised against driving if experiencing excessive daytime sleepiness fatigue.  Follow Up Instructions:  - 1 year follow-up with Beth NP or sooner   I discussed the assessment and treatment plan with  the patient. The patient was provided an opportunity to ask questions and all were answered. The patient agreed with the plan and demonstrated an understanding of the instructions.   The patient was advised to call back or seek an in-person evaluation if the symptoms worsen or if the condition fails to improve as anticipated.  I provided 22 minutes of non-face-to-face time during this encounter.   Glenford Bayley, NP

## 2023-07-28 NOTE — Patient Instructions (Signed)
Continue to wear CPAP nightly Follow-up in 1 year or sooner if needed

## 2023-07-29 NOTE — Progress Notes (Signed)
Reviewed and agree with assessment/plan.   Coralyn Helling, MD [facility] Pulmonary/Critical Care 07/29/2023, 5:51 PM Pager:  [phone]

## 2023-07-31 ENCOUNTER — Telehealth: Payer: Medicaid Other | Admitting: Primary Care

## 2023-07-31 LAB — HM DIABETES EYE EXAM

## 2023-08-11 LAB — HM DIABETES EYE EXAM

## 2023-08-13 DIAGNOSIS — G4733 Obstructive sleep apnea (adult) (pediatric): Secondary | ICD-10-CM | POA: Diagnosis not present

## 2023-08-14 ENCOUNTER — Telehealth: Payer: Medicaid Other | Admitting: Primary Care

## 2023-08-19 ENCOUNTER — Other Ambulatory Visit: Payer: Self-pay | Admitting: Internal Medicine

## 2023-08-19 DIAGNOSIS — E291 Testicular hypofunction: Secondary | ICD-10-CM

## 2023-09-12 DIAGNOSIS — G4733 Obstructive sleep apnea (adult) (pediatric): Secondary | ICD-10-CM | POA: Diagnosis not present

## 2023-09-18 ENCOUNTER — Other Ambulatory Visit (INDEPENDENT_AMBULATORY_CARE_PROVIDER_SITE_OTHER): Payer: Medicaid Other

## 2023-09-18 ENCOUNTER — Other Ambulatory Visit: Payer: Self-pay

## 2023-09-18 ENCOUNTER — Ambulatory Visit: Payer: Medicaid Other | Admitting: Physician Assistant

## 2023-09-18 ENCOUNTER — Encounter: Payer: Self-pay | Admitting: Physician Assistant

## 2023-09-18 VITALS — BP 110/80 | HR 85 | Ht 72.0 in | Wt 321.0 lb

## 2023-09-18 DIAGNOSIS — E876 Hypokalemia: Secondary | ICD-10-CM

## 2023-09-18 DIAGNOSIS — Z800 Family history of malignant neoplasm of digestive organs: Secondary | ICD-10-CM | POA: Diagnosis not present

## 2023-09-18 DIAGNOSIS — R197 Diarrhea, unspecified: Secondary | ICD-10-CM | POA: Diagnosis not present

## 2023-09-18 DIAGNOSIS — G4733 Obstructive sleep apnea (adult) (pediatric): Secondary | ICD-10-CM | POA: Diagnosis not present

## 2023-09-18 LAB — COMPREHENSIVE METABOLIC PANEL
ALT: 17 U/L (ref 0–53)
AST: 19 U/L (ref 0–37)
Albumin: 4.2 g/dL (ref 3.5–5.2)
Alkaline Phosphatase: 67 U/L (ref 39–117)
BUN: 13 mg/dL (ref 6–23)
CO2: 35 meq/L — ABNORMAL HIGH (ref 19–32)
Calcium: 9.6 mg/dL (ref 8.4–10.5)
Chloride: 100 meq/L (ref 96–112)
Creatinine, Ser: 0.95 mg/dL (ref 0.40–1.50)
GFR: 87.43 mL/min (ref >60.00)
Glucose, Bld: 133 mg/dL — ABNORMAL HIGH (ref 70–99)
Potassium: 3.5 meq/L (ref 3.5–5.1)
Sodium: 141 meq/L (ref 135–145)
Total Bilirubin: 1 mg/dL (ref 0.2–1.2)
Total Protein: 7.2 g/dL (ref 6.0–8.3)

## 2023-09-18 MED ORDER — NA SULFATE-K SULFATE-MG SULF 17.5-3.13-1.6 GM/177ML PO SOLN: 1.0000 | ORAL | 0 refills | Status: DC

## 2023-09-18 NOTE — Progress Notes (Signed)
Chief Complaint: Diarrhea, gas and discuss colonoscopy  HPI:    Mr. Zachary Murray is a 57 year old male with a past medical history as listed below including GAD, obesity, OSA on CPAP, PVD and multiple others, known to Dr. Lavon Paganini, who was referred to me by Etta Grandchild, MD for a complaint of diarrhea and gas and to discuss colonoscopy.      08/2018 colonoscopy with findings of small polyps, diverticulosis and hemorrhoids.  Repeat recommended in 5 years.  Given family history of colon cancer in a first-degree relative.    05/11/2019 office visit with Willette Cluster and at that time discussed history of diarrhea which was mostly nocturnal.  Negative infectious workup, fecal elastase normal.  Completed a course of Xifaxan but it got worse.  Normal TSH.    Today, patient presents to clinic and tells me that he is due for his colonoscopy.  His last 1 was done at the hospital due to an elevated BMI but today his BMI is 43.54, he has been on Ozempic for some years and has lost over 70 pounds.  Tells me he continues with his chronic diarrhea off-and-on but it has actually been better over the past couple of months and he has not really had it at all.  Tells me that with the Ozempic and Marcelline Deist he always has somewhat of an upset stomach.  Reminds me his dad had colon cancer.  Still taking Pletal.    Also reports being on a study drug for essential tremor.  Apparently it is a blocker of neurotransmitters.  Just wanted to make [place] aware for anesthesia purposes.    Denies fever, chills or weight loss.  Past Medical History:  Diagnosis Date   Degenerative arthritis of left knee 11/21/2021   GAD (generalized anxiety disorder) 10/22/2017   Hyperlipidemia associated with type 2 diabetes mellitus (HCC) 11/19/2013   The 10-year ASCVD risk score Denman George DC Jr., et al., 2013) is: 5.8%    Values used to calculate the score:      Age: 65 years      Sex: Male      Is Non-Hispanic African American: No      Diabetic: Yes       Tobacco smoker: No      Systolic Blood Pressure: 118 mmHg      Is BP treated: Yes      HDL Cholesterol: 65.7 mg/dL      Total Cholesterol: 158 mg/dL   Hypertension    Hypogonadism male 02/11/2018   Hypothyroidism 12/08/2014   Insulin-requiring or dependent type II diabetes mellitus (HCC) 06/05/2022   Irritable bowel syndrome with diarrhea 12/19/2021   Obesity    OSA on CPAP    Peripheral vascular disease (HCC) 05/06/2019   Saphenofemoral venous reflux; 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   Severe obesity (BMI >= 40) (HCC) 09/15/2012    Past Surgical History:  Procedure Laterality Date   BIOPSY  08/31/2018   Procedure: BIOPSY;  Surgeon: Napoleon Form, MD;  Location: [facility];  Service: Endoscopy;;   COLONOSCOPY WITH PROPOFOL N/A 11/12/2016   Procedure: COLONOSCOPY WITH PROPOFOL;  Surgeon: Napoleon Form, MD;  Location: [facility];  Service: Endoscopy;  Laterality: N/A;   COLONOSCOPY WITH PROPOFOL N/A 08/31/2018   Procedure: COLONOSCOPY WITH PROPOFOL;  Surgeon: Napoleon Form, MD;  Location: [facility];  Service: Endoscopy;  Laterality: N/A;   POLYPECTOMY  08/31/2018   Procedure: POLYPECTOMY;  Surgeon: Napoleon Form, MD;  Location: [facility];  Service: Endoscopy;;  WISDOM TOOTH EXTRACTION      Current Outpatient Medications  Medication Sig Dispense Refill   ALPRAZolam (XANAX) 0.5 MG tablet TAKE 1 TABLET(0.5 MG) BY MOUTH THREE TIMES DAILY AS NEEDED FOR ANXIETY 270 tablet 1   Blood Glucose Monitoring Suppl (ONETOUCH VERIO FLEX SYSTEM) w/Device KIT Use as instructed to check blood sugar once daily. 1 kit 0   cilostazol (PLETAL) 100 MG tablet Take 1 tablet (100 mg total) by mouth 2 (two) times daily. 180 tablet 1   CLOMID 50 MG tablet Take 1 tablet (50 mg total) by mouth daily. 90 tablet 0   Continuous Glucose Sensor (FREESTYLE LIBRE 3 SENSOR) MISC APPLY 1 SENSOR ON UPPER ARM EVERY 14 DAYS FOR CONTINUOUS GLUCOSE MONITORING 2 each 5   dapagliflozin propanediol (FARXIGA) 10 MG  TABS tablet Take 1 tablet (10 mg total) by mouth daily before breakfast. 90 tablet 1   Insulin Pen Needle (RELION PEN NEEDLES) 31G X 6 MM MISC Inject 1 Act into the skin once a week. Use as needed for insulin injection. 30 each 2   Lancets (ONETOUCH DELICA PLUS LANCET33G) MISC Use to check blood sugar once a day 100 each 1   levothyroxine (SYNTHROID) 75 MCG tablet Take 1 tablet (75 mcg total) by mouth daily before breakfast. 90 tablet 1   meloxicam (MOBIC) 7.5 MG tablet Take 1 tablet (7.5 mg total) by mouth daily. 90 tablet 0   propranolol (INDERAL) 40 MG tablet TAKE 1 TABLET(40 MG) BY MOUTH THREE TIMES DAILY 270 tablet 1   rosuvastatin (CRESTOR) 10 MG tablet Take 1 tablet (10 mg total) by mouth daily. 90 tablet 1   Semaglutide, 1 MG/DOSE, 4 MG/3ML SOPN Inject 1 mg as directed once a week. 6 mL 3   Testosterone 1.62 % GEL PLACE 1 PUMP ONTO THE SKIN DAILY ON EACH UPPER ARM 75 g 1   triamterene-hydrochlorothiazide (MAXZIDE-25) 37.5-25 MG tablet Take 1 tablet by mouth daily. 90 tablet 1   aspirin EC 81 MG tablet Take 1 tablet (81 mg total) by mouth daily. Swallow whole. (Patient not taking: Reported on 09/18/2023)     primidone (MYSOLINE) 50 MG tablet Take 1 tablet (50 mg total) by mouth at bedtime. (Patient not taking: Reported on 09/18/2023) 90 tablet 3   No current facility-administered medications for this visit.    Allergies as of 09/18/2023 - Review Complete 09/18/2023  Allergen Reaction Noted   Metformin and related Diarrhea 06/26/2017   Wellbutrin [bupropion] Other (See Comments) 06/26/2017   Mounjaro [tirzepatide] Rash 06/03/2022   Lisinopril Cough 12/08/2014    Family History  Problem Relation Age of Onset   Colon cancer Father    Hypertension Father    Thyroid cancer Mother    Colon polyps Mother    Thyroid disease Mother    Thyroid disease Sister    Thyroid disease Maternal Grandmother    Alcohol abuse Neg Hx    COPD Neg Hx    Diabetes Neg Hx    Early death Neg Hx     Hearing loss Neg Hx    Heart disease Neg Hx    Hyperlipidemia Neg Hx    Kidney disease Neg Hx    Stroke Neg Hx    Esophageal cancer Neg Hx     Social History   Socioeconomic History   Marital status: Single    Spouse name: Not on file   Number of children: 0   Years of education: Not on file   Highest education level: Some  college, no degree  Occupational History   Occupation: unemployed  Tobacco Use   Smoking status: Never   Smokeless tobacco: Former    Types: Associate Professor status: Never Used  Substance and Sexual Activity   Alcohol use: No    Alcohol/week: 0.0 standard drinks of alcohol    Comment: hx of alcohol abuse - quit 2017   Drug use: No   Sexual activity: Not Currently  Other Topics Concern   Not on file  Social History Narrative   Not on file   Social Determinants of Health   Financial Resource Strain: Patient Declined (04/20/2023)   Overall Financial Resource Strain (CARDIA)    Difficulty of Paying Living Expenses: Patient declined  Food Insecurity: Patient Declined (04/20/2023)   Hunger Vital Sign    Worried About Running Out of Food in the Last Year: Patient declined    Ran Out of Food in the Last Year: Patient declined  Transportation Needs: Patient Declined (04/20/2023)   PRAPARE - Administrator, Civil Service (Medical): Patient declined    Lack of Transportation (Non-Medical): Patient declined  Physical Activity: Unknown (04/20/2023)   Exercise Vital Sign    Days of Exercise per Week: Patient declined    Minutes of Exercise per Session: Not on file  Stress: Stress Concern Present (04/20/2023)   [employer] of Occupational Health - Occupational Stress Questionnaire    Feeling of Stress : To some extent  Social Connections: Unknown (04/20/2023)   Social Connection and Isolation Panel [NHANES]    Frequency of Communication with Friends and Family: Patient declined    Frequency of Social Gatherings with Friends and Family:  Patient declined    Attends Religious Services: Patient declined    Database administrator or Organizations: No    Attends Engineer, structural: Not on file    Marital Status: Patient declined  Catering manager Violence: Not on file    Review of Systems:    Constitutional: No weight loss, fever or chills Skin: No rash  Cardiovascular: No chest pain   Respiratory: No SOB  Gastrointestinal: See HPI and otherwise negative Genitourinary: No dysuria  Neurological: No headache, dizziness or syncope Musculoskeletal: No new muscle or joint pain Hematologic: No bleeding  Psychiatric: No history of depression or anxiety   Physical Exam:  Vital signs: BP (!) 140/90 (BP Location: Left Arm, Patient Position: Sitting, Cuff Size: Normal)   Pulse 85   Ht 6' (1.829 m)   Wt (!) 321 lb (145.6 kg)   SpO2 98%   BMI 43.54 kg/m    Constitutional:   Pleasant obese  Caucasian male appears to be in NAD, Well developed, Well nourished, alert and cooperative Head:  Normocephalic and atraumatic. Eyes:   PEERL, EOMI. No icterus. Conjunctiva pink. Ears:  Normal auditory acuity. Neck:  Supple Throat: Oral cavity and pharynx without inflammation, swelling or lesion.  Respiratory: Respirations even and unlabored. Lungs clear to auscultation bilaterally.   No wheezes, crackles, or rhonchi.  Cardiovascular: Normal S1, S2. No MRG. Regular rate and rhythm. No peripheral edema, cyanosis or pallor.  Gastrointestinal:  Soft, nondistended, nontender. No rebound or guarding. Normal bowel sounds. No appreciable masses or hepatomegaly. Rectal:  Not performed.  Msk:  Symmetrical without gross deformities. Without edema, no deformity or joint abnormality.  Neurologic:  Alert and  oriented x4;  grossly normal neurologically.  Skin:   Dry and intact without significant lesions or rashes. Psychiatric: Demonstrates good judgement  and reason without abnormal affect or behaviors.  RELEVANT LABS AND IMAGING: CBC     Component Value Date/Time   WBC 6.2 04/21/2023 0828   RBC 5.38 04/21/2023 0828   HGB 16.1 04/21/2023 0828   HCT 47.3 04/21/2023 0828   PLT 215.0 04/21/2023 0828   MCV 87.8 04/21/2023 0828   MCH 31.6 08/12/2013 1849   MCHC 34.0 04/21/2023 0828   RDW 14.0 04/21/2023 0828   LYMPHSABS 1.0 04/21/2023 0828   MONOABS 0.8 04/21/2023 0828   EOSABS 0.1 04/21/2023 0828   BASOSABS 0.0 04/21/2023 0828    CMP     Component Value Date/Time   NA 141 04/21/2023 0828   K 3.3 (L) 04/21/2023 0828   CL 100 04/21/2023 0828   CO2 30 04/21/2023 0828   GLUCOSE 133 (H) 04/21/2023 0828   BUN 16 04/21/2023 0828   CREATININE 0.92 04/21/2023 0828   CALCIUM 9.5 04/21/2023 0828   PROT 7.2 07/30/2022 0852   ALBUMIN 4.1 07/30/2022 0852   AST 16 07/30/2022 0852   ALT 17 07/30/2022 0852   ALKPHOS 63 07/30/2022 0852   BILITOT 1.1 07/30/2022 0852   GFRNONAA >90 08/12/2013 1849   GFRAA >90 08/12/2013 1849    Assessment: 1.  Family history of colon cancer: Last colonoscopy 5 years ago with 2 small polyps, repeat recommended now 2.  PVD on Pletal 3.  Diabetes on Ozempic 4.  Hypokalemia  Plan: 1.  Patient scheduled for a surveillance colonoscopy given family history of colon cancer with Dr. Lavon [facility] in the [facility].  Previous procedure had to be scheduled at the hospital given his BMI greater than 50, but with Ozempic he has lost 70+ pounds and his BMI is down to 43.54 today.  Did provide the patient a detailed list of risks for the procedure and he agrees to proceed. 2.  Patient was advised to hold his Ozempic for 1 week prior to procedure and his Pletal for 1 week prior to time of procedure.  We will communicate with his prescribing physician to ensure this is acceptable for him. 3.  Patient does tell me he is taking a study drug for an essential tremor which is a neurotransmitter blocker.  Will run this past our anesthesiologist Cathlyn Parsons just to make sure it is safe. 4.  Will recheck CMP given hypokalemia on  last check  5.  Patient to follow in clinic per recommendations after time of procedure.  Hyacinth Meeker, PA-C [facility] 09/18/2023, 10:46 AM  Cc: Etta Grandchild, MD

## 2023-09-18 NOTE — Patient Instructions (Signed)
Your provider has requested that you go to the basement level for lab work before leaving today. Press "B" on the elevator. The lab is located at the first door on the left as you exit the elevator.  We have sent the following medications to your pharmacy for you to pick up at your convenience: Suprep   You have been scheduled for a colonoscopy. Please follow written instructions given to you at your visit today.   Please pick up your prep supplies at the pharmacy within the next 1-3 days.  If you use inhalers (even only as needed), please bring them with you on the day of your procedure.  DO NOT TAKE 7 DAYS PRIOR TO TEST- Trulicity (dulaglutide) Ozempic, Wegovy (semaglutide) Mounjaro (tirzepatide) Bydureon Bcise (exanatide extended release)  DO NOT TAKE 1 DAY PRIOR TO YOUR TEST Rybelsus (semaglutide) Adlyxin (lixisenatide) Victoza (liraglutide) Byetta (exanatide) ___________________________________________________________________________  Due to recent changes in healthcare laws, you may see the results of your imaging and laboratory studies on MyChart before your provider has had a chance to review them.  We understand that in some cases there may be results that are confusing or concerning to you. Not all laboratory results come back in the same time frame and the provider may be waiting for multiple results in order to interpret others.  Please give [place] 48 hours in order for your provider to thoroughly review all the results before contacting the office for clarification of your results.   Thank you for choosing me and [facility].  Hyacinth Meeker PA-C

## 2023-09-22 ENCOUNTER — Encounter: Payer: Self-pay | Admitting: Internal Medicine

## 2023-09-24 ENCOUNTER — Encounter: Payer: Self-pay | Admitting: Internal Medicine

## 2023-09-29 ENCOUNTER — Other Ambulatory Visit: Payer: Self-pay

## 2023-09-29 MED ORDER — SEMAGLUTIDE (1 MG/DOSE) 4 MG/3ML ~~LOC~~ SOPN: 1.0000 mg | PEN_INJECTOR | SUBCUTANEOUS | 0 refills | Status: DC

## 2023-10-03 ENCOUNTER — Other Ambulatory Visit: Payer: Self-pay

## 2023-10-03 MED ORDER — SEMAGLUTIDE (1 MG/DOSE) 4 MG/3ML ~~LOC~~ SOPN: 1.0000 mg | PEN_INJECTOR | SUBCUTANEOUS | 0 refills | Status: DC

## 2023-10-10 ENCOUNTER — Other Ambulatory Visit (HOSPITAL_COMMUNITY): Payer: Self-pay

## 2023-10-13 DIAGNOSIS — G4733 Obstructive sleep apnea (adult) (pediatric): Secondary | ICD-10-CM | POA: Diagnosis not present

## 2023-10-15 ENCOUNTER — Telehealth: Payer: Self-pay

## 2023-10-15 NOTE — Telephone Encounter (Signed)
We received back clearance from Dr. Sanda Linger has advised that you can hold your Pletal for 7 days prior to procedure that is scheduled for 10/24/23 with Dr.Nandigam.   Your last dose of Petal should be 10/17/23.   Patient has been informed. Voiced understanding.

## 2023-10-20 ENCOUNTER — Other Ambulatory Visit: Payer: Self-pay | Admitting: Internal Medicine

## 2023-10-20 ENCOUNTER — Other Ambulatory Visit: Payer: Self-pay | Admitting: Emergency Medicine

## 2023-10-20 DIAGNOSIS — G250 Essential tremor: Secondary | ICD-10-CM

## 2023-10-20 DIAGNOSIS — I10 Essential (primary) hypertension: Secondary | ICD-10-CM

## 2023-10-20 NOTE — Telephone Encounter (Signed)
Please send request to PCP Dr. Yetta Barre.

## 2023-10-23 ENCOUNTER — Ambulatory Visit: Payer: Medicaid Other | Admitting: Internal Medicine

## 2023-10-23 ENCOUNTER — Encounter: Payer: Self-pay | Admitting: Internal Medicine

## 2023-10-23 VITALS — BP 124/82 | HR 70 | Temp 98.0°F | Resp 16 | Ht 72.0 in | Wt 320.0 lb

## 2023-10-23 DIAGNOSIS — I10 Essential (primary) hypertension: Secondary | ICD-10-CM | POA: Diagnosis not present

## 2023-10-23 DIAGNOSIS — G250 Essential tremor: Secondary | ICD-10-CM | POA: Diagnosis not present

## 2023-10-23 DIAGNOSIS — E118 Type 2 diabetes mellitus with unspecified complications: Secondary | ICD-10-CM | POA: Diagnosis not present

## 2023-10-23 DIAGNOSIS — D751 Secondary polycythemia: Secondary | ICD-10-CM | POA: Diagnosis not present

## 2023-10-23 DIAGNOSIS — Z23 Encounter for immunization: Secondary | ICD-10-CM

## 2023-10-23 DIAGNOSIS — E039 Hypothyroidism, unspecified: Secondary | ICD-10-CM | POA: Diagnosis not present

## 2023-10-23 DIAGNOSIS — E785 Hyperlipidemia, unspecified: Secondary | ICD-10-CM | POA: Diagnosis not present

## 2023-10-23 DIAGNOSIS — E1169 Type 2 diabetes mellitus with other specified complication: Secondary | ICD-10-CM | POA: Diagnosis not present

## 2023-10-23 DIAGNOSIS — E291 Testicular hypofunction: Secondary | ICD-10-CM | POA: Diagnosis not present

## 2023-10-23 LAB — LIPID PANEL
Cholesterol: 96 mg/dL (ref 0–200)
HDL: 33 mg/dL — ABNORMAL LOW (ref >39.00)
LDL Cholesterol: 48 mg/dL (ref 0–99)
NonHDL: 62.63
Total CHOL/HDL Ratio: 3
Triglycerides: 75 mg/dL (ref 0.0–149.0)
VLDL: 15 mg/dL (ref 0.0–40.0)

## 2023-10-23 LAB — URINALYSIS, ROUTINE W REFLEX MICROSCOPIC
Bilirubin Urine: NEGATIVE
Hgb urine dipstick: NEGATIVE
Ketones, ur: NEGATIVE
Leukocytes,Ua: NEGATIVE
Nitrite: NEGATIVE
RBC / HPF: NONE SEEN (ref 0–?)
Specific Gravity, Urine: 1.015 (ref 1.000–1.030)
Total Protein, Urine: NEGATIVE
Urine Glucose: >=1000 — AB
Urobilinogen, UA: 1 (ref 0.0–1.0)
WBC, UA: NONE SEEN (ref 0–?)
pH: 6.5 (ref 5.0–8.0)

## 2023-10-23 LAB — CBC WITH DIFFERENTIAL/PLATELET
Basophils Absolute: 0 10*3/uL (ref 0.0–0.1)
Basophils Relative: 0.6 % (ref 0.0–3.0)
Eosinophils Absolute: 0.1 10*3/uL (ref 0.0–0.7)
Eosinophils Relative: 2 % (ref 0.0–5.0)
HCT: 52.1 % — ABNORMAL HIGH (ref 39.0–52.0)
Hemoglobin: 17.6 g/dL — ABNORMAL HIGH (ref 13.0–17.0)
Lymphocytes Relative: 15.3 % (ref 12.0–46.0)
Lymphs Abs: 1 10*3/uL (ref 0.7–4.0)
MCHC: 33.8 g/dL (ref 30.0–36.0)
MCV: 89.1 fL (ref 78.0–100.0)
Monocytes Absolute: 0.7 10*3/uL (ref 0.1–1.0)
Monocytes Relative: 10.9 % (ref 3.0–12.0)
Neutro Abs: 4.6 10*3/uL (ref 1.4–7.7)
Neutrophils Relative %: 71.2 % (ref 43.0–77.0)
Platelets: 142 10*3/uL — ABNORMAL LOW (ref 150.0–400.0)
RBC: 5.85 Mil/uL — ABNORMAL HIGH (ref 4.22–5.81)
RDW: 14.1 % (ref 11.5–15.5)
WBC: 6.5 10*3/uL (ref 4.0–10.5)

## 2023-10-23 LAB — PSA: PSA: 1.14 ng/mL (ref 0.10–4.00)

## 2023-10-23 LAB — MICROALBUMIN / CREATININE URINE RATIO
Creatinine,U: 58 mg/dL
Microalb Creat Ratio: 1.2 mg/g (ref 0.0–30.0)
Microalb, Ur: <0.7 mg/dL (ref 0.0–1.9)

## 2023-10-23 LAB — TSH: TSH: 2.54 u[IU]/mL (ref 0.35–5.50)

## 2023-10-23 LAB — HEMOGLOBIN A1C: Hgb A1c MFr Bld: 6.8 % — ABNORMAL HIGH (ref 4.6–6.5)

## 2023-10-23 MED ORDER — BOOSTRIX 5-2.5-18.5 LF-MCG/0.5 IM SUSP: 0.5000 mL | Freq: Once | INTRAMUSCULAR | 0 refills | Status: AC

## 2023-10-23 MED ORDER — PROPRANOLOL HCL 40 MG PO TABS: 40.0000 mg | ORAL_TABLET | Freq: Three times a day (TID) | ORAL | 1 refills | Status: DC

## 2023-10-23 NOTE — Progress Notes (Signed)
Subjective:  Patient ID: Zachary Murray, male    DOB: [birth year]  Age: 57 y.o. MRN: [id number]  CC: Hypertension, Hypothyroidism, and Diabetes   [name] presents for f/up ---  Discussed the use of AI scribe software for clinical note transcription with the patient, who gave verbal consent to proceed.  History of Present Illness   The patient, with a history of essential tremor, has been participating in a home-based study involving a new medication, the name of which he cannot pronounce. He reports that the study drug initially seemed like a 'miracle drug,' allowing him to do things he hadn't been able to do in a long time. However, since reaching the maintenance dose, the efficacy seems to have diminished. He also reports some dizziness and lightheadedness, which he attributes to the study drug.  In addition to the study drug, the patient is also taking Clomid and testosterone. He has stopped taking primidone. He denies any chest pain, shortness of breath, or swelling in his legs or feet.  The patient has a history of diabetes and has had a diabetic eye exam within the year.   The patient also reports occasional diarrhea, which he attributes to Ozempic, but denies any blood in the stool.       Outpatient Medications Prior to Visit  Medication Sig Dispense Refill   ALPRAZolam (XANAX) 0.5 MG tablet TAKE 1 TABLET(0.5 MG) BY MOUTH THREE TIMES DAILY AS NEEDED FOR ANXIETY 270 tablet 1   aspirin EC 81 MG tablet Take 1 tablet (81 mg total) by mouth daily. Swallow whole.     Blood Glucose Monitoring Suppl (ONETOUCH VERIO FLEX SYSTEM) w/Device KIT Use as instructed to check blood sugar once daily. 1 kit 0   cilostazol (PLETAL) 100 MG tablet Take 1 tablet (100 mg total) by mouth 2 (two) times daily. 180 tablet 1   Continuous Glucose Sensor (FREESTYLE LIBRE 3 SENSOR) MISC APPLY 1 SENSOR ON UPPER ARM EVERY 14 DAYS FOR CONTINUOUS GLUCOSE MONITORING 2 each 5   Lancets (ONETOUCH DELICA PLUS  LANCET33G) MISC Use to check blood sugar once a day 100 each 1   levothyroxine (SYNTHROID) 75 MCG tablet Take 1 tablet (75 mcg total) by mouth daily before breakfast. 90 tablet 1   meloxicam (MOBIC) 7.5 MG tablet Take 1 tablet (7.5 mg total) by mouth daily. (Patient taking differently: Take 7.5 mg by mouth as needed for pain.) 90 tablet 0   rosuvastatin (CRESTOR) 10 MG tablet Take 1 tablet (10 mg total) by mouth daily. 90 tablet 1   Semaglutide, 1 MG/DOSE, 4 MG/3ML SOPN Inject 1 mg as directed once a week. 6 mL 0   Testosterone 1.62 % GEL PLACE 1 PUMP ONTO THE SKIN DAILY ON EACH UPPER ARM 75 g 1   triamterene-hydrochlorothiazide (MAXZIDE-25) 37.5-25 MG tablet TAKE 1 TABLET BY MOUTH DAILY 90 tablet 0   CLOMID 50 MG tablet Take 1 tablet (50 mg total) by mouth daily. 90 tablet 0   dapagliflozin propanediol (FARXIGA) 10 MG TABS tablet Take 1 tablet (10 mg total) by mouth daily before breakfast. 90 tablet 1   Na Sulfate-K Sulfate-Mg Sulf (SUPREP BOWEL PREP KIT) 17.5-3.13-1.6 GM/177ML SOLN Take 1 kit by mouth as directed. For colonoscopy prep 354 mL 0   propranolol (INDERAL) 40 MG tablet TAKE 1 TABLET(40 MG) BY MOUTH THREE TIMES DAILY 270 tablet 1   Insulin Pen Needle (RELION PEN NEEDLES) 31G X 6 MM MISC Inject 1 Act into the skin once a  week. Use as needed for insulin injection. 30 each 2   primidone (MYSOLINE) 50 MG tablet Take 1 tablet (50 mg total) by mouth at bedtime. (Patient not taking: Reported on 09/18/2023) 90 tablet 3   No facility-administered medications prior to visit.    ROS Review of Systems  Constitutional:  Positive for fatigue. Negative for appetite change, chills, diaphoresis and unexpected weight change.  Eyes:  Negative for visual disturbance.  Respiratory:  Positive for apnea. Negative for cough, chest tightness, shortness of breath and wheezing.   Cardiovascular:  Negative for chest pain, palpitations and leg swelling.  Gastrointestinal:  Negative for abdominal pain,  constipation, diarrhea, nausea and vomiting.  Genitourinary:  Negative for difficulty urinating.  Musculoskeletal:  Positive for arthralgias. Negative for back pain and myalgias.  Skin: Negative.   Neurological:  Positive for tremors. Negative for dizziness, weakness, light-headedness and headaches.  Hematological:  Negative for adenopathy. Does not bruise/bleed easily.  Psychiatric/Behavioral:  Negative for behavioral problems, confusion, decreased concentration, dysphoric mood, hallucinations, sleep disturbance and suicidal ideas. The patient is nervous/anxious.     Objective:  BP 124/82 (BP Location: Left Arm, Patient Position: Sitting, Cuff Size: Normal)   Pulse 70   Temp 98 F (36.7 C) (Oral)   Resp 16   Ht 6' (1.829 m)   Wt (!) 320 lb (145.2 kg)   SpO2 94%   BMI 43.40 kg/m   BP Readings from Last 3 Encounters:  10/23/23 124/82  09/18/23 110/80  06/09/23 120/76    Wt Readings from Last 3 Encounters:  10/23/23 (!) 320 lb (145.2 kg)  09/18/23 (!) 321 lb (145.6 kg)  06/09/23 (!) 325 lb 9.6 oz (147.7 kg)    Physical Exam Constitutional:      General: He is not in acute distress.    Appearance: He is obese. He is not toxic-appearing or diaphoretic.  HENT:     Nose: Nose normal.     Mouth/Throat:     Mouth: Mucous membranes are moist.  Eyes:     General: No scleral icterus.    Conjunctiva/sclera: Conjunctivae normal.  Cardiovascular:     Rate and Rhythm: Normal rate and regular rhythm.     Heart sounds: Normal heart sounds, S1 normal and S2 normal. No murmur heard.    No friction rub. No gallop.     Comments: EKG- NSR, 65 No LVH, Q waves, or ST/T waves  Pulmonary:     Effort: Pulmonary effort is normal.     Breath sounds: No stridor. No wheezing, rhonchi or rales.  Abdominal:     General: Abdomen is flat and protuberant. There is no distension.     Palpations: There is no hepatomegaly, splenomegaly or mass.     Tenderness: There is no abdominal tenderness.  There is no guarding.     Hernia: No hernia is present.  Musculoskeletal:     Right lower leg: No edema.     Left lower leg: No edema.  Skin:    General: Skin is warm and dry.  Neurological:     General: No focal deficit present.     Mental Status: He is alert. Mental status is at baseline.  Psychiatric:        Mood and Affect: Mood normal.        Behavior: Behavior normal.     Lab Results  Component Value Date   WBC 6.5 10/23/2023   HGB 17.6 (H) 10/23/2023   HCT 52.1 (H) 10/23/2023   PLT 142.0 (  L) 10/23/2023   GLUCOSE 133 (H) 09/18/2023   CHOL 96 10/23/2023   TRIG 75.0 10/23/2023   HDL 33.00 (L) 10/23/2023   LDLCALC 48 10/23/2023   ALT 17 09/18/2023   AST 19 09/18/2023   NA 141 09/18/2023   K 3.5 09/18/2023   CL 100 09/18/2023   CREATININE 0.95 09/18/2023   BUN 13 09/18/2023   CO2 35 (H) 09/18/2023   TSH 2.54 10/23/2023   PSA 1.14 10/23/2023   INR 1.2 (H) 12/18/2021   HGBA1C 6.8 (H) 10/23/2023   MICROALBUR <0.7 10/23/2023    VAS [place] LOWER EXTREMITY VENOUS REFLUX  Result Date: 03/30/2021  Lower Venous Reflux Study Patient Name:  Zachary Murray  Date of Exam:   03/30/2021 Medical Rec #: [id number]     Accession #:    [id number] Date of Birth: [birth year]     Patient Gender: M Patient Age:   55Y Exam Location:  [facility] Procedure:      VAS [place] LOWER EXTREMITY VENOUS REFLUX Referring Phys: [id number] Dennard Schaumann CAIN --------------------------------------------------------------------------------  Indications: Pain, Swelling, and varicosities.  Comparison Study: 02/27/2021: No evidence of deep vein thrombosis bilaterally.                   Age indeterminate venous thrombosis in the bilateral greater                   saphenous veins. Performing Technologist: Ethelle Lyon  Examination Guidelines: A complete evaluation includes B-mode imaging, spectral Doppler, color Doppler, and power Doppler as needed of all accessible portions of each vessel. Bilateral  testing is considered an integral part of a complete examination. Limited examinations for reoccurring indications may be performed as noted. The reflux portion of the exam is performed with the patient in reverse Trendelenburg. Significant venous reflux is defined as >500 ms in the superficial venous system, and >1 second in the deep venous system.  Venous Reflux Times +----------------------+---------+------+-----------+------------+-------------+ RIGHT                 Reflux NoRefluxReflux TimeDiameter cmsComments                                      Yes                                       +----------------------+---------+------+-----------+------------+-------------+ CFV                   no                                                  +----------------------+---------+------+-----------+------------+-------------+ FV mid                no                                                  +----------------------+---------+------+-----------+------------+-------------+ Popliteal             no                                                  +----------------------+---------+------+-----------+------------+-------------+  GSV at [facility]                      yes    >500 ms      0.67                  +----------------------+---------+------+-----------+------------+-------------+ GSV prox thigh                  yes    >500 ms      0.68                  +----------------------+---------+------+-----------+------------+-------------+ GSV mid thigh                   yes    >500 ms      0.45                  +----------------------+---------+------+-----------+------------+-------------+ GSV dist thigh                  yes    >500 ms      0.39                  +----------------------+---------+------+-----------+------------+-------------+ GSV at knee                     yes    >500 ms      0.41    out of fascia  +----------------------+---------+------+-----------+------------+-------------+ GSV prox calf                   yes    >500 ms      0.48    out of fascia +----------------------+---------+------+-----------+------------+-------------+ SSV Pop Fossa         no                            0.49                  +----------------------+---------+------+-----------+------------+-------------+ SSV prox calf                   yes    >500 ms      0.39                  +----------------------+---------+------+-----------+------------+-------------+ SSV mid calf          no                            0.25                  +----------------------+---------+------+-----------+------------+-------------+ Distal calf perforator          yes    >500 ms                            +----------------------+---------+------+-----------+------------+-------------+  +-------------------+---------+------+-----------+------------+--------+ LEFT               Reflux NoRefluxReflux TimeDiameter cmsComments                              Yes                                  +-------------------+---------+------+-----------+------------+--------+ CFV  yes   >1 second                      +-------------------+---------+------+-----------+------------+--------+ FV mid             no                                             +-------------------+---------+------+-----------+------------+--------+ Popliteal          no                                             +-------------------+---------+------+-----------+------------+--------+ GSV at SFJ                   yes    >500 ms      0.96             +-------------------+---------+------+-----------+------------+--------+ GSV prox thigh               yes    >500 ms      0.63             +-------------------+---------+------+-----------+------------+--------+ GSV mid thigh                yes     >500 ms      0.46             +-------------------+---------+------+-----------+------------+--------+ GSV dist thigh               yes    >500 ms      0.47             +-------------------+---------+------+-----------+------------+--------+ GSV at knee                  yes    >500 ms      0.55             +-------------------+---------+------+-----------+------------+--------+ GSV prox calf                yes    >500 ms      0.51             +-------------------+---------+------+-----------+------------+--------+ SSV Pop Fossa      no                            0.56             +-------------------+---------+------+-----------+------------+--------+ SSV prox calf      no                            0.41             +-------------------+---------+------+-----------+------------+--------+ SSV mid calf       no                            0.33             +-------------------+---------+------+-----------+------------+--------+ Mid calf perforator          yes    >500 ms                       +-------------------+---------+------+-----------+------------+--------+   Summary:  Bilateral: - No evidence of deep vein thrombosis seen in the lower extremities, bilaterally, from the common femoral through the popliteal veins.  Right: - Color duplex evaluation of the right lower extremity shows there is thrombus in the distal greater saphenous vein. - Venous reflux is noted in the right sapheno-femoral junction. - Venous reflux is noted in the right greater saphenous vein in the thigh. - Venous reflux is noted in the right greater saphenous vein in the calf. - Venous reflux is noted in the right short saphenous vein. - Venous reflux is noted in the right calf perforator vein.  Left: - Color duplex evaluation of the left lower extremity shows there is thrombus in the distal greater saphenous vein and proximal greater saphenous vein. - Venous reflux is noted in the left common  femoral vein. - Venous reflux is noted in the left sapheno-femoral junction. - Venous reflux is noted in the left greater saphenous vein in the thigh. - Venous reflux is noted in the left greater saphenous vein in the calf. - Venous reflux is noted in the left calf perforator vein.  *See table(s) above for measurements and observations. Electronically signed by Lemar Livings MD on 03/30/2021 at 5:04:43 PM.    Final     Assessment & Plan:  Type II diabetes mellitus with manifestations (HCC)- His blood sugar is well-controlled. -     Urinalysis, Routine w reflex microscopic; Future -     Hemoglobin A1c; Future -     Microalbumin / creatinine urine ratio; Future  Essential hypertension, benign- His blood pressure is well-controlled.  EKG is negative for LVH. -     CBC with Differential/Platelet; Future -     TSH; Future -     Urinalysis, Routine w reflex microscopic; Future -     EKG 12-Lead  Acquired hypothyroidism- He is euthyroid. -     TSH; Future  Hypogonadism male -     PSA; Future -     Testosterone Total,Free,Bio, Males; Future  Hyperlipidemia associated with type 2 diabetes mellitus (HCC) - LDL goal achieved. Doing well on the statin  -     Lipid panel; Future  Need for prophylactic vaccination with combined diphtheria-tetanus-pertussis (DTP) vaccine -     Boostrix; Inject 0.5 mLs into the muscle once for 1 dose.  Dispense: 0.5 mL; Refill: 0  Tremor, essential -     Propranolol HCl; Take 1 tablet (40 mg total) by mouth 3 (three) times daily.  Dispense: 270 tablet; Refill: 1  Erythrocytosis-will discontinue Clomid and the SGLT2 inhibitor. -     Ambulatory referral to Hematology / Oncology     Follow-up: Return in about 6 months (around 04/22/2024).  Sanda Linger, MD

## 2023-10-23 NOTE — Patient Instructions (Signed)

## 2023-10-24 ENCOUNTER — Ambulatory Visit (AMBULATORY_SURGERY_CENTER): Payer: Medicaid Other | Admitting: Gastroenterology

## 2023-10-24 ENCOUNTER — Encounter: Payer: Self-pay | Admitting: Gastroenterology

## 2023-10-24 VITALS — BP 136/81 | HR 73 | Temp 97.8°F | Resp 18 | Ht 72.0 in | Wt 321.0 lb

## 2023-10-24 DIAGNOSIS — F411 Generalized anxiety disorder: Secondary | ICD-10-CM | POA: Diagnosis not present

## 2023-10-24 DIAGNOSIS — D122 Benign neoplasm of ascending colon: Secondary | ICD-10-CM

## 2023-10-24 DIAGNOSIS — D123 Benign neoplasm of transverse colon: Secondary | ICD-10-CM

## 2023-10-24 DIAGNOSIS — K644 Residual hemorrhoidal skin tags: Secondary | ICD-10-CM | POA: Diagnosis not present

## 2023-10-24 DIAGNOSIS — E039 Hypothyroidism, unspecified: Secondary | ICD-10-CM | POA: Diagnosis not present

## 2023-10-24 DIAGNOSIS — Z800 Family history of malignant neoplasm of digestive organs: Secondary | ICD-10-CM | POA: Diagnosis not present

## 2023-10-24 DIAGNOSIS — K5730 Diverticulosis of large intestine without perforation or abscess without bleeding: Secondary | ICD-10-CM

## 2023-10-24 DIAGNOSIS — K635 Polyp of colon: Secondary | ICD-10-CM | POA: Diagnosis not present

## 2023-10-24 DIAGNOSIS — Z1211 Encounter for screening for malignant neoplasm of colon: Secondary | ICD-10-CM

## 2023-10-24 DIAGNOSIS — K648 Other hemorrhoids: Secondary | ICD-10-CM

## 2023-10-24 DIAGNOSIS — D120 Benign neoplasm of cecum: Secondary | ICD-10-CM

## 2023-10-24 DIAGNOSIS — D751 Secondary polycythemia: Secondary | ICD-10-CM | POA: Insufficient documentation

## 2023-10-24 DIAGNOSIS — I10 Essential (primary) hypertension: Secondary | ICD-10-CM | POA: Diagnosis not present

## 2023-10-24 DIAGNOSIS — G4733 Obstructive sleep apnea (adult) (pediatric): Secondary | ICD-10-CM | POA: Diagnosis not present

## 2023-10-24 LAB — TESTOSTERONE TOTAL,FREE,BIO, MALES
Albumin: 4.1 g/dL (ref 3.6–5.1)
Sex Hormone Binding: 38 nmol/L (ref 22–77)
Testosterone, Bioavailable: 183.8 ng/dL (ref 110.0–575.0)
Testosterone, Free: 97.6 pg/mL (ref 46.0–224.0)
Testosterone: 738 ng/dL (ref 250–827)

## 2023-10-24 MED ORDER — SODIUM CHLORIDE 0.9 % IV SOLN: 500.0000 mL | Freq: Once | INTRAVENOUS | Status: DC

## 2023-10-24 NOTE — Progress Notes (Signed)
[facility] History and Physical   Primary Care Physician:  Etta Grandchild, MD   Reason for Procedure:  Family history of colon cancer  Plan:    Screening colonoscopy with possible interventions as needed     HPI: Zachary Murray is a very pleasant 57 y.o. male here for screening colonoscopy. Denies any nausea, vomiting, abdominal pain, melena or bright red blood per rectum  The risks and benefits as well as alternatives of endoscopic procedure(s) have been discussed and reviewed. All questions answered. The patient agrees to proceed.    Past Medical History:  Diagnosis Date   Degenerative arthritis of left knee 11/21/2021   GAD (generalized anxiety disorder) 10/22/2017   Hyperlipidemia associated with type 2 diabetes mellitus (HCC) 11/19/2013   The 10-year ASCVD risk score Denman George DC Jr., et al., 2013) is: 5.8%    Values used to calculate the score:      Age: 42 years      Sex: Male      Is Non-Hispanic African American: No      Diabetic: Yes      Tobacco smoker: No      Systolic Blood Pressure: 118 mmHg      Is BP treated: Yes      HDL Cholesterol: 65.7 mg/dL      Total Cholesterol: 158 mg/dL   Hypertension    Hypogonadism male 02/11/2018   Hypothyroidism 12/08/2014   Insulin-requiring or dependent type II diabetes mellitus (HCC) 06/05/2022   Irritable bowel syndrome with diarrhea 12/19/2021   Obesity    OSA on CPAP    Peripheral vascular disease (HCC) 05/06/2019   Saphenofemoral venous reflux; 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   Severe obesity (BMI >= 40) (HCC) 09/15/2012    Past Surgical History:  Procedure Laterality Date   BIOPSY  08/31/2018   Procedure: BIOPSY;  Surgeon: Napoleon Form, MD;  Location: [facility];  Service: Endoscopy;;   COLONOSCOPY WITH PROPOFOL N/A 11/12/2016   Procedure: COLONOSCOPY WITH PROPOFOL;  Surgeon: Napoleon Form, MD;  Location: [facility];  Service: Endoscopy;  Laterality: N/A;   COLONOSCOPY WITH PROPOFOL N/A 08/31/2018    Procedure: COLONOSCOPY WITH PROPOFOL;  Surgeon: Napoleon Form, MD;  Location: [facility];  Service: Endoscopy;  Laterality: N/A;   POLYPECTOMY  08/31/2018   Procedure: POLYPECTOMY;  Surgeon: Napoleon Form, MD;  Location: [facility];  Service: Endoscopy;;   WISDOM TOOTH EXTRACTION      Prior to Admission medications   Medication Sig Start Date End Date Taking? Authorizing Provider  ALPRAZolam (XANAX) 0.5 MG tablet TAKE 1 TABLET(0.5 MG) BY MOUTH THREE TIMES DAILY AS NEEDED FOR ANXIETY 05/20/23  Yes Etta Grandchild, MD  aspirin EC 81 MG tablet Take 1 tablet (81 mg total) by mouth daily. Swallow whole. 05/30/23  Yes Marykay Lex, MD  Blood Glucose Monitoring Suppl (ONETOUCH VERIO FLEX SYSTEM) w/Device KIT Use as instructed to check blood sugar once daily. 04/01/19  Yes Reather Littler, MD  dapagliflozin propanediol (FARXIGA) 10 MG TABS tablet Take 10 mg by mouth daily.   Yes [provider]  Lancets [facility] DELICA PLUS [place]) MISC Use to check blood sugar once a day 05/07/22  Yes Reather Littler, MD  levothyroxine (SYNTHROID) 75 MCG tablet Take 1 tablet (75 mcg total) by mouth daily before breakfast. 05/20/23  Yes Etta Grandchild, MD  propranolol (INDERAL) 40 MG tablet Take 1 tablet (40 mg total) by mouth 3 (three) times daily. 10/23/23  Yes Etta Grandchild, MD  rosuvastatin (CRESTOR) 10 MG tablet Take 1 tablet (10 mg total) by mouth daily. 05/20/23  Yes Etta Grandchild, MD  Testosterone 1.62 % GEL PLACE 1 PUMP ONTO THE SKIN DAILY ON [facility] UPPER ARM 08/19/23  Yes Etta Grandchild, MD  triamterene-hydrochlorothiazide (MAXZIDE-25) 37.5-25 MG tablet TAKE 1 TABLET BY MOUTH DAILY 10/20/23  Yes Etta Grandchild, MD  cilostazol (PLETAL) 100 MG tablet Take 1 tablet (100 mg total) by mouth 2 (two) times daily. 05/20/23 11/16/23  Etta Grandchild, MD  Continuous Glucose Sensor (FREESTYLE LIBRE 3 SENSOR) MISC APPLY 1 SENSOR ON UPPER ARM EVERY 14 DAYS FOR CONTINUOUS GLUCOSE MONITORING Patient not  taking: Reported on 10/24/2023 04/22/23   Etta Grandchild, MD  meloxicam (MOBIC) 7.5 MG tablet Take 1 tablet (7.5 mg total) by mouth daily. Patient taking differently: Take 7.5 mg by mouth as needed for pain. 05/20/23   Etta Grandchild, MD  Semaglutide, 1 MG/DOSE, 4 MG/3ML SOPN Inject 1 mg as directed once a week. 10/03/23   Etta Grandchild, MD    Current Outpatient Medications  Medication Sig Dispense Refill   ALPRAZolam (XANAX) 0.5 MG tablet TAKE 1 TABLET(0.5 MG) BY MOUTH THREE TIMES DAILY AS NEEDED FOR ANXIETY 270 tablet 1   aspirin EC 81 MG tablet Take 1 tablet (81 mg total) by mouth daily. Swallow whole.     Blood Glucose Monitoring Suppl (ONETOUCH VERIO FLEX SYSTEM) w/Device KIT Use as instructed to check blood sugar once daily. 1 kit 0   dapagliflozin propanediol (FARXIGA) 10 MG TABS tablet Take 10 mg by mouth daily.     Lancets (ONETOUCH DELICA PLUS LANCET33G) MISC Use to check blood sugar once a day 100 each 1   levothyroxine (SYNTHROID) 75 MCG tablet Take 1 tablet (75 mcg total) by mouth daily before breakfast. 90 tablet 1   propranolol (INDERAL) 40 MG tablet Take 1 tablet (40 mg total) by mouth 3 (three) times daily. 270 tablet 1   rosuvastatin (CRESTOR) 10 MG tablet Take 1 tablet (10 mg total) by mouth daily. 90 tablet 1   Testosterone 1.62 % GEL PLACE 1 PUMP ONTO THE SKIN DAILY ON EACH UPPER ARM 75 g 1   triamterene-hydrochlorothiazide (MAXZIDE-25) 37.5-25 MG tablet TAKE 1 TABLET BY MOUTH DAILY 90 tablet 0   cilostazol (PLETAL) 100 MG tablet Take 1 tablet (100 mg total) by mouth 2 (two) times daily. 180 tablet 1   Continuous Glucose Sensor (FREESTYLE LIBRE 3 SENSOR) MISC APPLY 1 SENSOR ON UPPER ARM EVERY 14 DAYS FOR CONTINUOUS GLUCOSE MONITORING (Patient not taking: Reported on 10/24/2023) 2 each 5   meloxicam (MOBIC) 7.5 MG tablet Take 1 tablet (7.5 mg total) by mouth daily. (Patient taking differently: Take 7.5 mg by mouth as needed for pain.) 90 tablet 0   Semaglutide, 1 MG/DOSE, 4  MG/3ML SOPN Inject 1 mg as directed once a week. 6 mL 0   Current Facility-Administered Medications  Medication Dose Route Frequency Provider Last Rate Last Admin   0.9 %  sodium chloride infusion  500 mL Intravenous Once Napoleon Form, MD        Allergies as of 10/24/2023 - Review Complete 10/24/2023  Allergen Reaction Noted   Metformin and related Diarrhea 06/26/2017   Wellbutrin [bupropion] Other (See Comments) 06/26/2017   Mounjaro [tirzepatide] Rash 06/03/2022   Lisinopril Cough 12/08/2014    Family History  Problem Relation Age of Onset   Colon cancer Father    Hypertension Father    Thyroid cancer  Mother    Colon polyps Mother    Thyroid disease Mother    Thyroid disease Sister    Thyroid disease Maternal Grandmother    Alcohol abuse Neg Hx    COPD Neg Hx    Diabetes Neg Hx    Early death Neg Hx    Hearing loss Neg Hx    Heart disease Neg Hx    Hyperlipidemia Neg Hx    Kidney disease Neg Hx    Stroke Neg Hx    Esophageal cancer Neg Hx     Social History   Socioeconomic History   Marital status: Single    Spouse name: Not on file   Number of children: 0   Years of education: Not on file   Highest education level: Some college, no degree  Occupational History   Occupation: unemployed  Tobacco Use   Smoking status: Never   Smokeless tobacco: Former    Types: Associate Professor status: Never Used  Substance and Sexual Activity   Alcohol use: No    Alcohol/week: 0.0 standard drinks of alcohol    Comment: hx of alcohol abuse - quit 2017   Drug use: No   Sexual activity: Not Currently  Other Topics Concern   Not on file  Social History Narrative   Not on file   Social Determinants of Health   Financial Resource Strain: Patient Declined (10/22/2023)   Overall Financial Resource Strain (CARDIA)    Difficulty of Paying Living Expenses: Patient declined  Food Insecurity: Patient Declined (10/22/2023)   Hunger Vital Sign    Worried About  Running Out of Food in the Last Year: Patient declined    Ran Out of Food in the Last Year: Patient declined  Transportation Needs: No Transportation Needs (10/22/2023)   PRAPARE - Administrator, Civil Service (Medical): No    Lack of Transportation (Non-Medical): No  Physical Activity: Unknown (10/22/2023)   Exercise Vital Sign    Days of Exercise per Week: Patient declined    Minutes of Exercise per Session: Not on file  Stress: Stress Concern Present (10/22/2023)   [employer] of Occupational Health - Occupational Stress Questionnaire    Feeling of Stress : To some extent  Social Connections: Unknown (10/22/2023)   Social Connection and Isolation Panel [NHANES]    Frequency of Communication with Friends and Family: Patient declined    Frequency of Social Gatherings with Friends and Family: Patient declined    Attends Religious Services: Patient declined    Database administrator or Organizations: No    Attends Engineer, structural: Not on file    Marital Status: Never married  Intimate Partner Violence: Not on file    Review of Systems:  All other review of systems negative except as mentioned in the HPI.  Physical Exam: Vital signs in last 24 hours: BP 102/62   Pulse 72   Temp 97.8 F (36.6 C) (Skin)   Ht 6' (1.829 m)   Wt (!) 321 lb (145.6 kg)   SpO2 94%   BMI 43.54 kg/m  General:   Alert, NAD Lungs:  Clear .   Heart:  Regular rate and rhythm Abdomen:  Soft, nontender and nondistended. Neuro/Psych:  Alert and cooperative. Normal mood and affect. A and O x 3  Reviewed labs, radiology imaging, old records and pertinent past GI work up  Patient is appropriate for planned procedure(s) and anesthesia in an ambulatory setting  Iona Beard , MD [phone]

## 2023-10-24 NOTE — Progress Notes (Signed)
Called to room to assist during endoscopic procedure.  Patient ID and intended procedure confirmed with present staff. Received instructions for my participation in the procedure from the performing physician.  

## 2023-10-24 NOTE — Progress Notes (Signed)
To pacu, VSS. Report to [email or web address] 

## 2023-10-24 NOTE — Op Note (Addendum)
[facility] Patient Name: Zachary Murray Procedure Date: 10/24/2023 1:54 PM MRN: [id number] Endoscopist: Napoleon Form , MD, [id number] Age: 57 Referring MD:  Date of Birth: 1966-10-10 Gender: Male Account #: [id number] Procedure:                Colonoscopy Indications:              Screening in patient at increased risk: Family                            history of 1st-degree relative with colorectal                            cancer Medicines:                Monitored Anesthesia Care Procedure:                Pre-Anesthesia Assessment:                           - Prior to the procedure, a History and Physical                            was performed, and patient medications and                            allergies were reviewed. The patient's tolerance of                            previous anesthesia was also reviewed. The risks                            and benefits of the procedure and the sedation                            options and risks were discussed with the patient.                            All questions were answered, and informed consent                            was obtained. Prior Anticoagulants: The patient                            last took Pletal (cilostazol) 7 days prior to the                            procedure. ASA Grade Assessment: III - A patient                            with severe systemic disease. After reviewing the                            risks and benefits, the patient was deemed in  satisfactory condition to undergo the procedure.                           After obtaining informed consent, the colonoscope                            was passed under direct vision. Throughout the                            procedure, the patient's blood pressure, pulse, and                            oxygen saturations were monitored continuously. The                            Olympus Scope SN: [id number] was introduced  through                            the anus and advanced to the the cecum, identified                            by appendiceal orifice and ileocecal valve. The                            colonoscopy was performed without difficulty. The                            patient tolerated the procedure well. The quality                            of the bowel preparation was good. The ileocecal                            valve, appendiceal orifice, and rectum were                            photographed. Scope In: 1:59:35 PM Scope Out: 2:11:46 PM Scope Withdrawal Time: 0 hours 9 minutes 33 seconds  Total Procedure Duration: 0 hours 12 minutes 11 seconds  Findings:                 The perianal and digital rectal examinations were                            normal.                           Four sessile polyps were found in the transverse                            colon, ascending colon and cecum. The polyps were 3                            to 4 mm in size. These polyps were removed with a  cold snare. Resection and retrieval were complete.                           Scattered large-mouthed, medium-mouthed and                            small-mouthed diverticula were found in the sigmoid                            colon, descending colon, transverse colon and                            ascending colon.                           Non-bleeding external and internal hemorrhoids were                            found during retroflexion. The hemorrhoids were                            medium-sized. Complications:            No immediate complications. Estimated Blood Loss:     Estimated blood loss was minimal. Impression:               - Four 3 to 4 mm polyps in the transverse colon, in                            the ascending colon and in the cecum, removed with                            a cold snare. Resected and retrieved.                           - Moderate  diverticulosis in the sigmoid colon, in                            the descending colon, in the transverse colon and                            in the ascending colon.                           - Non-bleeding external and internal hemorrhoids. Recommendation:           - Patient has a contact number available for                            emergencies. The signs and symptoms of potential                            delayed complications were discussed with the                            patient. Return to normal activities  tomorrow.                            Written discharge instructions were provided to the                            patient.                           - Resume previous diet.                           - Continue present medications.                           - Await pathology results.                           - Repeat colonoscopy in 3 - 5 years for                            surveillance.                           - Resume Pletal (cilostazol) at prior dose                            tomorrow. Refer to managing physician for further                            adjustment of therapy. Napoleon Form, MD 10/24/2023 2:20:20 PM This report has been signed electronically.

## 2023-10-24 NOTE — Patient Instructions (Signed)
Thank you for letting us take care of your healthcare needs today. Please see handouts given to you on Polyps, Diverticulosis, Hemorrhoids. You may resume your Pletal tomorrow.     YOU HAD AN ENDOSCOPIC PROCEDURE TODAY AT THE [facility]:   Refer to the procedure report that was given to you for any specific questions about what was found during the examination.  If the procedure report does not answer your questions, please call your gastroenterologist to clarify.  If you requested that your care partner not be given the details of your procedure findings, then the procedure report has been included in a sealed envelope for you to review at your convenience later.  YOU SHOULD EXPECT: Some feelings of bloating in the abdomen. Passage of more gas than usual.  Walking can help get rid of the air that was put into your GI tract during the procedure and reduce the bloating. If you had a lower endoscopy (such as a colonoscopy or flexible sigmoidoscopy) you may notice spotting of blood in your stool or on the toilet paper. If you underwent a bowel prep for your procedure, you may not have a normal bowel movement for a few days.  Please Note:  You might notice some irritation and congestion in your nose or some drainage.  This is from the oxygen used during your procedure.  There is no need for concern and it should clear up in a day or so.  SYMPTOMS TO REPORT IMMEDIATELY:  Following lower endoscopy (colonoscopy or flexible sigmoidoscopy):  Excessive amounts of blood in the stool  Significant tenderness or worsening of abdominal pains  Swelling of the abdomen that is new, acute  Fever of 100F or higher   For urgent or emergent issues, a gastroenterologist can be reached at any hour by calling [phone]. Do not use MyChart messaging for urgent concerns.    DIET:  We do recommend a small meal at first, but then you may proceed to your regular diet.  Drink plenty of fluids but you  should avoid alcoholic beverages for 24 hours.  ACTIVITY:  You should plan to take it easy for the rest of today and you should NOT DRIVE or use heavy machinery until tomorrow (because of the sedation medicines used during the test).    FOLLOW UP: Our staff will call the number listed on your records the next business day following your procedure.  We will call around 7:15- 8:00 am to check on you and address any questions or concerns that you may have regarding the information given to you following your procedure. If we do not reach you, we will leave a message.     If any biopsies were taken you will be contacted by phone or by letter within the next 1-3 weeks.  Please call us at [phone] if you have not heard about the biopsies in 3 weeks.    SIGNATURES/CONFIDENTIALITY: You and/or your care partner have signed paperwork which will be entered into your electronic medical record.  These signatures attest to the fact that that the information above on your After Visit Summary has been reviewed and is understood.  Full responsibility of the confidentiality of this discharge information lies with you and/or your care-partner.

## 2023-10-27 ENCOUNTER — Telehealth: Payer: Self-pay | Admitting: *Deleted

## 2023-10-27 ENCOUNTER — Other Ambulatory Visit: Payer: Self-pay | Admitting: Emergency Medicine

## 2023-10-27 DIAGNOSIS — G250 Essential tremor: Secondary | ICD-10-CM

## 2023-10-27 NOTE — Telephone Encounter (Signed)
Attempted f/u phone call. No answer. Left message. °

## 2023-10-29 ENCOUNTER — Other Ambulatory Visit (HOSPITAL_COMMUNITY): Payer: Self-pay

## 2023-10-29 LAB — SURGICAL PATHOLOGY

## 2023-11-04 ENCOUNTER — Encounter: Payer: Self-pay | Admitting: Internal Medicine

## 2023-11-04 ENCOUNTER — Other Ambulatory Visit: Payer: Self-pay | Admitting: Internal Medicine

## 2023-11-04 DIAGNOSIS — E291 Testicular hypofunction: Secondary | ICD-10-CM

## 2023-11-04 NOTE — Telephone Encounter (Signed)
 Care team updated and letter sent for eye exam notes.

## 2023-11-12 DIAGNOSIS — G4733 Obstructive sleep apnea (adult) (pediatric): Secondary | ICD-10-CM | POA: Diagnosis not present

## 2023-11-18 ENCOUNTER — Other Ambulatory Visit: Payer: Self-pay | Admitting: Internal Medicine

## 2023-11-18 DIAGNOSIS — F411 Generalized anxiety disorder: Secondary | ICD-10-CM

## 2023-11-18 DIAGNOSIS — E291 Testicular hypofunction: Secondary | ICD-10-CM

## 2023-11-18 DIAGNOSIS — E785 Hyperlipidemia, unspecified: Secondary | ICD-10-CM

## 2023-11-24 ENCOUNTER — Other Ambulatory Visit: Payer: Medicaid Other

## 2023-11-24 ENCOUNTER — Inpatient Hospital Stay: Payer: Medicaid Other | Attending: Hematology and Oncology | Admitting: Hematology and Oncology

## 2023-11-24 ENCOUNTER — Encounter: Payer: Self-pay | Admitting: Hematology and Oncology

## 2023-11-24 ENCOUNTER — Inpatient Hospital Stay: Payer: Medicaid Other

## 2023-11-24 VITALS — BP 113/82 | HR 89 | Temp 98.5°F | Resp 18 | Wt 320.0 lb

## 2023-11-24 DIAGNOSIS — Z800 Family history of malignant neoplasm of digestive organs: Secondary | ICD-10-CM | POA: Insufficient documentation

## 2023-11-24 DIAGNOSIS — E291 Testicular hypofunction: Secondary | ICD-10-CM | POA: Diagnosis not present

## 2023-11-24 DIAGNOSIS — G4733 Obstructive sleep apnea (adult) (pediatric): Secondary | ICD-10-CM | POA: Diagnosis not present

## 2023-11-24 DIAGNOSIS — D751 Secondary polycythemia: Secondary | ICD-10-CM | POA: Diagnosis not present

## 2023-11-24 DIAGNOSIS — Z79890 Hormone replacement therapy (postmenopausal): Secondary | ICD-10-CM | POA: Insufficient documentation

## 2023-11-24 DIAGNOSIS — Z85850 Personal history of malignant neoplasm of thyroid: Secondary | ICD-10-CM | POA: Diagnosis not present

## 2023-11-24 NOTE — Assessment & Plan Note (Signed)
 He will continue testosterone  replacement therapy as directed by his primary care doctor We discussed risk of prostate cancer and thrombosis and he will continue routine screening with his primary care doctor and aspirin  therapy to prevent risk of thrombosis

## 2023-11-24 NOTE — Assessment & Plan Note (Signed)
 The patient had multiple causes for erythrocytosis including obstructive sleep apnea as well as testosterone  replacement therapy In the past, the patient was able to donate blood regularly but he was disqualified due to clinical trial We will resume phlebotomy today I recommend phlebotomy every other month to keep hemoglobin under 17 g I will see him in 6 months for further follow-up

## 2023-11-24 NOTE — Progress Notes (Signed)
 Zachary Murray presents today for phlebotomy per MD orders. 16 g phlebotomy kit to L antecubital Phlebotomy procedure started at 1127 and ended at 1134. 515 grams removed. Patient declined to stay for 30 minutes after procedure, declined food & drink. Patient tolerated procedure well. IV needle removed intact. VS WNL.

## 2023-11-24 NOTE — Progress Notes (Signed)
 [facility] CONSULT NOTE  Patient Care Team: Joshua Debby CROME, MD as PCP - General (Internal Medicine) Anner Alm ORN, MD as PCP - Cardiology (Cardiology) [facility], [facility]   ASSESSMENT & PLAN Erythrocytosis The patient had multiple causes for erythrocytosis including obstructive sleep apnea as well as testosterone  replacement therapy In the past, the patient was able to donate blood regularly but he was disqualified due to clinical trial We will resume phlebotomy today I recommend phlebotomy every other month to keep hemoglobin under 17 g I will see him in 6 months for further follow-up  Hypogonadism male He will continue testosterone  replacement therapy as directed by his primary care doctor We discussed risk of prostate cancer and thrombosis and he will continue routine screening with his primary care doctor and aspirin  therapy to prevent risk of thrombosis  Orders Placed This Encounter  Procedures   CBC with Differential/Platelet    Standing Status:   Standing    Number of Occurrences:   22    Expiration Date:   11/23/2024   Zachary Bedford, MD  11/24/2023 12:24 PM  The total time spent in the appointment was 60 minutes encounter with patients including review of chart and various tests results, discussions about plan of care and coordination of care plan   All questions were answered. The patient knows to call the clinic with any problems, questions or concerns. No barriers to learning was detected.  Zachary Bedford, MD 1/6/202512:24 PM   CHIEF COMPLAINTS/PURPOSE OF CONSULTATION:  Erythrocytosis  HISTORY OF PRESENTING ILLNESS:  Alm Zachary Murray 58 y.o. male is here because of elevated hemoglobin.  He was found to have abnormal CBC from recent blood work.  The patient has been on testosterone  replacement therapy for the last 3 to 4 years.  He would donate blood on a regular basis However, the patient started to participate in the clinical trial and it  was disqualified from donating blood.  His last blood donation was around October 2024 He would donate blood routinely every 2 to 4 months. The patient is dependent on testosterone  replacement therapy due to chronic fatigue He is also diagnosed with obstructive sleep apnea and uses CPAP machine on a regular basis He denies intermittent headaches, shortness of breath on exertion, frequent leg cramps and occasional chest pain.  He never suffer from diagnosis of blood clot.  He denies smoking.  MEDICAL HISTORY:  Past Medical History:  Diagnosis Date   Degenerative arthritis of left knee 11/21/2021   GAD (generalized anxiety disorder) 10/22/2017   Hyperlipidemia associated with type 2 diabetes mellitus (HCC) 11/19/2013   The 10-year ASCVD risk score Verdon DC Jr., et al., 2013) is: 5.8%    Values used to calculate the score:      Age: 40 years      Sex: Male      Is Non-Hispanic African American: No      Diabetic: Yes      Tobacco smoker: No      Systolic Blood Pressure: 118 mmHg      Is BP treated: Yes      HDL Cholesterol: 65.7 mg/dL      Total Cholesterol: 158 mg/dL   Hypertension    Hypogonadism male 02/11/2018   Hypothyroidism 12/08/2014   Insulin -requiring or dependent type II diabetes mellitus (HCC) 06/05/2022   Irritable bowel syndrome with diarrhea 12/19/2021   Obesity    OSA on CPAP    Peripheral vascular disease (HCC) 05/06/2019   Saphenofemoral  venous reflux; 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   Severe obesity (BMI >= 40) (HCC) 09/15/2012    SURGICAL HISTORY: Past Surgical History:  Procedure Laterality Date   BIOPSY  08/31/2018   Procedure: BIOPSY;  Surgeon: Shila Gustav GAILS, MD;  Location: [facility];  Service: Endoscopy;;   COLONOSCOPY WITH PROPOFOL  N/A 11/12/2016   Procedure: COLONOSCOPY WITH PROPOFOL ;  Surgeon: Gustav GAILS Shila, MD;  Location: [facility];  Service: Endoscopy;  Laterality: N/A;   COLONOSCOPY WITH PROPOFOL  N/A 08/31/2018   Procedure: COLONOSCOPY WITH PROPOFOL ;   Surgeon: Shila Gustav GAILS, MD;  Location: [facility];  Service: Endoscopy;  Laterality: N/A;   POLYPECTOMY  08/31/2018   Procedure: POLYPECTOMY;  Surgeon: Shila Gustav GAILS, MD;  Location: [facility];  Service: Endoscopy;;   WISDOM TOOTH EXTRACTION      SOCIAL HISTORY: Social History   Socioeconomic History   Marital status: Single    Spouse name: Not on file   Number of children: 0   Years of education: Not on file   Highest education level: Some college, no degree  Occupational History   Occupation: unemployed  Tobacco Use   Smoking status: Never   Smokeless tobacco: Former    Types: Associate Professor status: Never Used  Substance and Sexual Activity   Alcohol use: No    Alcohol/week: 0.0 standard drinks of alcohol    Comment: hx of alcohol abuse - quit 2017   Drug use: No   Sexual activity: Not Currently  Other Topics Concern   Not on file  Social History Narrative   Not on file   Social Drivers of Health   Financial Resource Strain: Patient Declined (10/22/2023)   Overall Financial Resource Strain (CARDIA)    Difficulty of Paying Living Expenses: Patient declined  Food Insecurity: Patient Declined (10/22/2023)   Hunger Vital Sign    Worried About Running Out of Food in the Last Year: Patient declined    Ran Out of Food in the Last Year: Patient declined  Transportation Needs: No Transportation Needs (10/22/2023)   PRAPARE - Administrator, Civil Service (Medical): No    Lack of Transportation (Non-Medical): No  Physical Activity: Unknown (10/22/2023)   Exercise Vital Sign    Days of Exercise per Week: Patient declined    Minutes of Exercise per Session: Not on file  Stress: Stress Concern Present (10/22/2023)   [employer] of Occupational Health - Occupational Stress Questionnaire    Feeling of Stress : To some extent  Social Connections: Unknown (10/22/2023)   Social Connection and Isolation Panel [NHANES]    Frequency of  Communication with Friends and Family: Patient declined    Frequency of Social Gatherings with Friends and Family: Patient declined    Attends Religious Services: Patient declined    Database Administrator or Organizations: No    Attends Engineer, Structural: Not on file    Marital Status: Never married  Catering Manager Violence: Not on file    FAMILY HISTORY: Family History  Problem Relation Age of Onset   Colon cancer Father    Hypertension Father    Thyroid  cancer Mother    Colon polyps Mother    Thyroid  disease Mother    Thyroid  disease Sister    Thyroid  disease Maternal Grandmother    Alcohol abuse Neg Hx    COPD Neg Hx    Diabetes Neg Hx    Early death Neg Hx    Hearing loss Neg Hx  Heart disease Neg Hx    Hyperlipidemia Neg Hx    Kidney disease Neg Hx    Stroke Neg Hx    Esophageal cancer Neg Hx     ALLERGIES:  is allergic to metformin  and related, wellbutrin  [bupropion ], mounjaro  [tirzepatide ], and lisinopril .  MEDICATIONS:  Current Outpatient Medications  Medication Sig Dispense Refill   cilostazol  (PLETAL ) 100 MG tablet Take 100 mg by mouth 2 (two) times daily.     ALPRAZolam  (XANAX ) 0.5 MG tablet TAKE 1 TABLET(0.5 MG) BY MOUTH THREE TIMES DAILY AS NEEDED FOR ANXIETY 270 tablet 0   aspirin  EC 81 MG tablet Take 1 tablet (81 mg total) by mouth daily. Swallow whole.     Blood Glucose Monitoring Suppl (ONETOUCH VERIO FLEX SYSTEM) w/Device KIT Use as instructed to check blood sugar once daily. 1 kit 0   Continuous Glucose Sensor (FREESTYLE LIBRE 3 SENSOR) MISC APPLY 1 SENSOR ON UPPER ARM EVERY 14 DAYS FOR CONTINUOUS GLUCOSE MONITORING (Patient not taking: Reported on 10/24/2023) 2 each 5   dapagliflozin  propanediol (FARXIGA ) 10 MG TABS tablet Take 10 mg by mouth daily.     Lancets (ONETOUCH DELICA PLUS LANCET33G) MISC Use to check blood sugar once a day 100 each 1   levothyroxine  (SYNTHROID ) 75 MCG tablet Take 1 tablet (75 mcg total) by mouth daily before  breakfast. 90 tablet 1   meloxicam  (MOBIC ) 7.5 MG tablet Take 1 tablet (7.5 mg total) by mouth daily. (Patient taking differently: Take 7.5 mg by mouth as needed for pain.) 90 tablet 0   OZEMPIC , 1 MG/DOSE, 4 MG/3ML SOPN INJECT 1MG  UNDER THE SKIN ONCE A WEEK ON THE SAME DAY EACH WEEK 6 mL 0   propranolol  (INDERAL ) 40 MG tablet Take 1 tablet (40 mg total) by mouth 3 (three) times daily. 270 tablet 1   rosuvastatin  (CRESTOR ) 10 MG tablet TAKE 1 TABLET(10 MG) BY MOUTH DAILY 90 tablet 1   Testosterone  1.62 % GEL APPLY 1 PUMP ONTO THE SKIN ON EACH UPPER ARM DAILY 75 g 0   triamterene -hydrochlorothiazide (MAXZIDE-25) 37.5-25 MG tablet TAKE 1 TABLET BY MOUTH DAILY 90 tablet 0   No current facility-administered medications for this visit.    REVIEW OF SYSTEMS:   Constitutional: Denies fevers, chills or abnormal night sweats Eyes: Denies blurriness of vision, double vision or watery eyes Ears, nose, mouth, throat, and face: Denies mucositis or sore throat Respiratory: Denies cough, dyspnea or wheezes Cardiovascular: Denies palpitation, chest discomfort or lower extremity swelling Gastrointestinal:  Denies nausea, heartburn or change in bowel habits Skin: Denies abnormal skin rashes Lymphatics: Denies new lymphadenopathy or easy bruising Neurological:Denies numbness, tingling or new weaknesses Behavioral/Psych: Mood is stable, no new changes  All other systems were reviewed with the patient and are negative.  PHYSICAL EXAMINATION: ECOG PERFORMANCE STATUS: 1 - Symptomatic but completely ambulatory  Vitals:   11/24/23 1027  BP: 113/82  Pulse: 89  Resp: 18  Temp: 98.5 F (36.9 C)  SpO2: 95%   Filed Weights   11/24/23 1027  Weight: (!) 320 lb (145.2 kg)    GENERAL:alert, no distress and comfortable SKIN: skin color, texture, turgor are normal, no rashes or significant lesions EYES: normal, conjunctiva are pink and non-injected, sclera clear OROPHARYNX:no exudate, no erythema and lips,  buccal mucosa, and tongue normal  NECK: supple, thyroid  normal size, non-tender, without nodularity LYMPH:  no palpable lymphadenopathy in the cervical, axillary or inguinal LUNGS: clear to auscultation and percussion with normal breathing effort HEART: regular rate & rhythm  and no murmurs and no lower extremity edema ABDOMEN:abdomen soft, non-tender and normal bowel sounds Musculoskeletal:no cyanosis of digits and no clubbing  PSYCH: alert & oriented x 3 with fluent speech NEURO: no focal motor/sensory deficits  LABORATORY DATA:  I have reviewed the data as listed Recent Results (from the past 2160 hours)  Comp Met (CMET)     Status: Abnormal   Collection Time: 09/18/23 11:20 AM  Result Value Ref Range   Sodium 141 135 - 145 mEq/L   Potassium 3.5 3.5 - 5.1 mEq/L   Chloride 100 96 - 112 mEq/L   CO2 35 (H) 19 - 32 mEq/L   Glucose, Bld 133 (H) 70 - 99 mg/dL   BUN 13 6 - 23 mg/dL   Creatinine, Ser 9.04 0.40 - 1.50 mg/dL   Total Bilirubin 1.0 0.2 - 1.2 mg/dL   Alkaline Phosphatase 67 39 - 117 U/L   AST 19 0 - 37 U/L   ALT 17 0 - 53 U/L   Total Protein 7.2 6.0 - 8.3 g/dL   Albumin 4.2 3.5 - 5.2 g/dL   GFR 12.56 >39.99 mL/min    Comment: Calculated using the CKD-EPI Creatinine Equation (2021)   Calcium  9.6 8.4 - 10.5 mg/dL  Testosterone  Total,Free,Bio, Males     Status: None   Collection Time: 10/23/23  8:31 AM  Result Value Ref Range   Testosterone  738 250 - 827 ng/dL   Albumin 4.1 3.6 - 5.1 g/dL   Sex Hormone Binding 38 22 - 77 nmol/L   Testosterone , Free 97.6 46.0 - 224.0 pg/mL   Testosterone , Bioavailable 183.8 110.0 - 575.0 ng/dL  PSA     Status: None   Collection Time: 10/23/23  8:31 AM  Result Value Ref Range   PSA 1.14 0.10 - 4.00 ng/mL    Comment: Test performed using Access Hybritech PSA Assay, a parmagnetic partical, chemiluminecent immunoassay.  Lipid panel     Status: Abnormal   Collection Time: 10/23/23  8:31 AM  Result Value Ref Range   Cholesterol 96 0 - 200  mg/dL    Comment: ATP III Classification       Desirable:  < 200 mg/dL               Borderline High:  200 - 239 mg/dL          High:  > = 759 mg/dL   Triglycerides 24.9 0.0 - 149.0 mg/dL    Comment: Normal:  <849 mg/dLBorderline High:  150 - 199 mg/dL   HDL 66.99 (L) >60.99 mg/dL   VLDL 84.9 0.0 - 59.9 mg/dL   LDL Cholesterol 48 0 - 99 mg/dL   Total CHOL/HDL Ratio 3     Comment:                Men          Women1/2 Average Risk     3.4          3.3Average Risk          5.0          4.42X Average Risk          9.6          7.13X Average Risk          15.0          11.0                       NonHDL 62.63  Comment: NOTE:  Non-HDL goal should be 30 mg/dL higher than patient's LDL goal (i.e. LDL goal of < 70 mg/dL, would have non-HDL goal of < 100 mg/dL)  Microalbumin / creatinine urine ratio     Status: None   Collection Time: 10/23/23  8:31 AM  Result Value Ref Range   Microalb, Ur <0.7 0.0 - 1.9 mg/dL   Creatinine,U 41.9 mg/dL   Microalb Creat Ratio 1.2 0.0 - 30.0 mg/g  Hemoglobin A1c     Status: Abnormal   Collection Time: 10/23/23  8:31 AM  Result Value Ref Range   Hgb A1c MFr Bld 6.8 (H) 4.6 - 6.5 %    Comment: Glycemic Control Guidelines for People with Diabetes:Non Diabetic:  <6%Goal of Therapy: <7%Additional Action Suggested:  >8%   Urinalysis, Routine w reflex microscopic     Status: Abnormal   Collection Time: 10/23/23  8:31 AM  Result Value Ref Range   Color, Urine YELLOW Yellow;Lt. Yellow;Straw;Dark Yellow;Amber;Green;Red;Brown   APPearance CLEAR Clear;Turbid;Slightly Cloudy;Cloudy   Specific Gravity, Urine 1.015 1.000 - 1.030   pH 6.5 5.0 - 8.0   Total Protein, Urine NEGATIVE Negative   Urine Glucose >=1000 (A) Negative   Ketones, ur NEGATIVE Negative   Bilirubin Urine NEGATIVE Negative   Hgb urine dipstick NEGATIVE Negative   Urobilinogen, UA 1.0 0.0 - 1.0   Leukocytes,Ua NEGATIVE Negative   Nitrite NEGATIVE Negative   WBC, UA none seen 0-2/hpf   RBC / HPF none  seen 0-2/hpf   Squamous Epithelial / HPF Rare(0-4/hpf) Rare(0-4/hpf)  TSH     Status: None   Collection Time: 10/23/23  8:31 AM  Result Value Ref Range   TSH 2.54 0.35 - 5.50 uIU/mL  CBC with Differential/Platelet     Status: Abnormal   Collection Time: 10/23/23  8:31 AM  Result Value Ref Range   WBC 6.5 4.0 - 10.5 K/uL   RBC 5.85 (H) 4.22 - 5.81 Mil/uL   Hemoglobin 17.6 (H) 13.0 - 17.0 g/dL   HCT 47.8 (H) 60.9 - 47.9 %   MCV 89.1 78.0 - 100.0 fl   MCHC 33.8 30.0 - 36.0 g/dL   RDW 85.8 88.4 - 84.4 %   Platelets 142.0 (L) 150.0 - 400.0 K/uL    Comment: Result may be falsely decreased due to platelet clumping.   Neutrophils Relative % 71.2 43.0 - 77.0 %   Lymphocytes Relative 15.3 12.0 - 46.0 %   Monocytes Relative 10.9 3.0 - 12.0 %   Eosinophils Relative 2.0 0.0 - 5.0 %   Basophils Relative 0.6 0.0 - 3.0 %   Neutro Abs 4.6 1.4 - 7.7 K/uL   Lymphs Abs 1.0 0.7 - 4.0 K/uL   Monocytes Absolute 0.7 0.1 - 1.0 K/uL   Eosinophils Absolute 0.1 0.0 - 0.7 K/uL   Basophils Absolute 0.0 0.0 - 0.1 K/uL  Surgical pathology ([facility])     Status: None   Collection Time: 10/24/23 12:00 AM  Result Value Ref Range   SURGICAL PATHOLOGY      SURGICAL PATHOLOGY [facility] [address], [address] [place], [place] [address] Telephone [phone] or [phone] Fax [phone]  REPORT OF SURGICAL PATHOLOGY   Accession #: [id number] Patient Name: Zachary Murray, Zachary Murray Visit # : [id number]  MRN: [id number] Physician: Shila Gustav [place] DOB/Age 06-29-1966 (Age: 83) Gender: M Collected Date: 10/24/2023 Received Date: 10/28/2023  FINAL DIAGNOSIS       1. Surgical [P], colon, cecal polyp x1; ascending polyp x2; transverse  polyp x1, polyp (4) :       TUBULAR ADENOMA (1) WITHOUT HIGH GRADE DYSPLASIA.      SESSILE SERRATED POLYP (2) WITHOUT CYTOLOGIC DYSPLASIA.      COLONIC MUCOSA WITH BENIGN LYMPHOID AGGREGATE (1).       DATE SIGNED OUT: 10/29/2023 ELECTRONIC  SIGNATURE : Belvie Come, John, Pathologist, Electronic Signature  MICROSCOPIC DESCRIPTION  CASE COMMENTS STAINS USED IN DIAGNOSIS: H&E    CLINICAL HISTORY  SPECIMEN(S) OBTAINED 1. Surgical [P], Colon, Cecal Polyp X1; A scending Polyp X2; Transverse Polyp X1, Polyp (4)  SPECIMEN COMMENTS: 1. Family history of colon cancer; benign neoplasm of cecum; benign neoplasm of ascending colon; benign neoplasm of transverse colon SPECIMEN CLINICAL INFORMATION: 1. R/O adenoma    Gross Description 1. Received in formalin are tan, soft tissue fragments that are submitted in toto. Number: 4, Size: 0.2 cm smallest to 0.6 cm largest, (1B) ( TT )        Report signed out from the following location(s) [place] [address] [place], [place] [address] CLIA #: [id number]  [facility] [address] [place], [place] [address] CLIA #: [id number]

## 2023-11-26 ENCOUNTER — Encounter: Payer: Self-pay | Admitting: Gastroenterology

## 2023-11-26 ENCOUNTER — Other Ambulatory Visit: Payer: Self-pay | Admitting: Internal Medicine

## 2023-11-26 ENCOUNTER — Encounter: Payer: Self-pay | Admitting: Internal Medicine

## 2023-11-26 DIAGNOSIS — E291 Testicular hypofunction: Secondary | ICD-10-CM

## 2023-11-26 MED ORDER — CLOMIPHENE CITRATE 50 MG PO TABS: 50.0000 mg | ORAL_TABLET | Freq: Every day | ORAL | 0 refills | Status: DC

## 2023-11-27 ENCOUNTER — Other Ambulatory Visit: Payer: Self-pay | Admitting: Internal Medicine

## 2023-11-27 DIAGNOSIS — E291 Testicular hypofunction: Secondary | ICD-10-CM

## 2023-11-29 ENCOUNTER — Other Ambulatory Visit: Payer: Self-pay | Admitting: Internal Medicine

## 2023-11-29 DIAGNOSIS — E291 Testicular hypofunction: Secondary | ICD-10-CM

## 2023-11-29 MED ORDER — CLOMIPHENE CITRATE 50 MG PO TABS: 50.0000 mg | ORAL_TABLET | ORAL | 1 refills | Status: AC

## 2023-12-02 ENCOUNTER — Telehealth: Payer: Self-pay

## 2023-12-02 ENCOUNTER — Other Ambulatory Visit (HOSPITAL_COMMUNITY): Payer: Self-pay

## 2023-12-02 NOTE — Telephone Encounter (Signed)
 Pharmacy Patient Advocate Encounter   Received notification from Fax that prior authorization for Clomid  50MG  tablets is required/requested.   Insurance verification completed.   The patient is insured through [facility] .   Per test claim: PA required; PA submitted to above mentioned insurance via CoverMyMeds Key/confirmation #/EOC [id number] Status is pending

## 2023-12-03 ENCOUNTER — Encounter: Payer: Self-pay | Admitting: Internal Medicine

## 2023-12-03 MED ORDER — DAPAGLIFLOZIN PROPANEDIOL 10 MG PO TABS: 10.0000 mg | ORAL_TABLET | Freq: Every day | ORAL | 0 refills | Status: DC

## 2023-12-03 MED ORDER — TESTOSTERONE 1.62 % TD GEL: 1.0000 | Freq: Every day | TRANSDERMAL | 0 refills | Status: DC

## 2023-12-03 NOTE — Telephone Encounter (Signed)
 Pharmacy Patient Advocate Encounter  Received notification from [facility] that Prior Authorization for Clomid  50MG  tablets  has been DENIED.  Full denial letter will be uploaded to the media tab. See denial reason below.     PA #/Case ID/Reference #: [id number]

## 2023-12-05 ENCOUNTER — Other Ambulatory Visit (HOSPITAL_COMMUNITY): Payer: Self-pay

## 2023-12-14 ENCOUNTER — Encounter: Payer: Self-pay | Admitting: Internal Medicine

## 2023-12-17 DIAGNOSIS — L281 Prurigo nodularis: Secondary | ICD-10-CM | POA: Diagnosis not present

## 2023-12-17 DIAGNOSIS — L918 Other hypertrophic disorders of the skin: Secondary | ICD-10-CM | POA: Diagnosis not present

## 2023-12-17 DIAGNOSIS — Z872 Personal history of diseases of the skin and subcutaneous tissue: Secondary | ICD-10-CM | POA: Diagnosis not present

## 2023-12-17 DIAGNOSIS — B079 Viral wart, unspecified: Secondary | ICD-10-CM | POA: Diagnosis not present

## 2023-12-17 DIAGNOSIS — D225 Melanocytic nevi of trunk: Secondary | ICD-10-CM | POA: Diagnosis not present

## 2023-12-17 DIAGNOSIS — L570 Actinic keratosis: Secondary | ICD-10-CM | POA: Diagnosis not present

## 2023-12-19 DIAGNOSIS — G4733 Obstructive sleep apnea (adult) (pediatric): Secondary | ICD-10-CM | POA: Diagnosis not present

## 2023-12-23 ENCOUNTER — Other Ambulatory Visit (HOSPITAL_COMMUNITY): Payer: Self-pay

## 2023-12-23 ENCOUNTER — Encounter: Payer: Self-pay | Admitting: Internal Medicine

## 2023-12-23 NOTE — Telephone Encounter (Signed)
Patient is on Pletal, 100mg . Has never been ordered by you, please advise if we can do a PA for him to continue medication?

## 2023-12-23 NOTE — Telephone Encounter (Signed)
Please initiate PA for pletal

## 2023-12-23 NOTE — Telephone Encounter (Signed)
Please clarify medication, dose and directions of use. No valid script on file for Cilostazol.

## 2023-12-25 ENCOUNTER — Other Ambulatory Visit: Payer: Self-pay | Admitting: Internal Medicine

## 2023-12-25 DIAGNOSIS — I739 Peripheral vascular disease, unspecified: Secondary | ICD-10-CM

## 2023-12-25 MED ORDER — CILOSTAZOL 100 MG PO TABS: 100.0000 mg | ORAL_TABLET | Freq: Two times a day (BID) | ORAL | 0 refills | Status: DC

## 2023-12-29 ENCOUNTER — Other Ambulatory Visit (HOSPITAL_COMMUNITY): Payer: Self-pay

## 2023-12-29 ENCOUNTER — Telehealth: Payer: Self-pay

## 2023-12-29 ENCOUNTER — Encounter: Payer: Self-pay | Admitting: Hematology and Oncology

## 2023-12-29 NOTE — Telephone Encounter (Signed)
 This has been addressed in another encounter.

## 2023-12-29 NOTE — Telephone Encounter (Signed)
 Patient needs a PA for this medication. He is taking 1 tablet (100mg ) Twice daily. Looks like a see some back and fourth messages from a mychart message but I don't see a PA started. PLEASE ADVISE.

## 2023-12-29 NOTE — Telephone Encounter (Signed)
 Pharmacy Patient Advocate Encounter   Received notification from Pt Calls Messages that prior authorization for Cilostazol  100mg  is required/requested.   Insurance verification completed.   The patient is insured through [facility] .   Per test claim: PA required; PA submitted to above mentioned insurance via CoverMyMeds Key/confirmation #/EOC [id number] Status is pending

## 2023-12-29 NOTE — Telephone Encounter (Signed)
 Patient has been made aware.

## 2023-12-29 NOTE — Telephone Encounter (Signed)
 Pharmacy Patient Advocate Encounter  Received notification from [facility] that Prior Authorization for Cilostazol  100mg   has been APPROVED from 12/29/23 to 12/28/24. Ran test claim, Copay is $4. This test claim was processed through [facility]- copay amounts may vary at other pharmacies due to pharmacy/plan contracts, or as the patient moves through the different stages of their insurance plan.   PA #/Case ID/Reference #: [id number]  Left a voice message at [facility] to notify of the approval.

## 2024-01-13 ENCOUNTER — Other Ambulatory Visit: Payer: Self-pay | Admitting: Internal Medicine

## 2024-01-13 DIAGNOSIS — I10 Essential (primary) hypertension: Secondary | ICD-10-CM

## 2024-01-20 ENCOUNTER — Other Ambulatory Visit: Payer: Self-pay | Admitting: Internal Medicine

## 2024-01-20 DIAGNOSIS — E291 Testicular hypofunction: Secondary | ICD-10-CM

## 2024-01-20 DIAGNOSIS — F411 Generalized anxiety disorder: Secondary | ICD-10-CM

## 2024-01-20 DIAGNOSIS — I739 Peripheral vascular disease, unspecified: Secondary | ICD-10-CM

## 2024-01-20 DIAGNOSIS — E039 Hypothyroidism, unspecified: Secondary | ICD-10-CM

## 2024-01-22 MED ORDER — OZEMPIC (1 MG/DOSE) 4 MG/3ML ~~LOC~~ SOPN: 1.0000 mg | PEN_INJECTOR | SUBCUTANEOUS | 0 refills | Status: DC

## 2024-01-22 MED ORDER — CILOSTAZOL 100 MG PO TABS: 100.0000 mg | ORAL_TABLET | Freq: Two times a day (BID) | ORAL | 0 refills | Status: DC

## 2024-01-22 MED ORDER — ALPRAZOLAM 0.5 MG PO TABS
ORAL_TABLET | ORAL | 0 refills | Status: DC
Start: 2024-01-22 — End: 2024-05-28

## 2024-01-22 MED ORDER — DAPAGLIFLOZIN PROPANEDIOL 10 MG PO TABS: 10.0000 mg | ORAL_TABLET | Freq: Every day | ORAL | 0 refills | Status: DC

## 2024-01-22 MED ORDER — TESTOSTERONE 20.25 MG/ACT (1.62%) TD GEL: 1.0000 | Freq: Every day | TRANSDERMAL | 0 refills | Status: DC

## 2024-01-28 ENCOUNTER — Inpatient Hospital Stay: Payer: Medicaid Other

## 2024-01-28 ENCOUNTER — Inpatient Hospital Stay: Payer: Medicaid Other | Attending: Hematology and Oncology

## 2024-01-28 VITALS — BP 116/75 | HR 85 | Temp 98.6°F | Resp 16

## 2024-01-28 DIAGNOSIS — D751 Secondary polycythemia: Secondary | ICD-10-CM | POA: Insufficient documentation

## 2024-01-28 DIAGNOSIS — Z85850 Personal history of malignant neoplasm of thyroid: Secondary | ICD-10-CM | POA: Diagnosis not present

## 2024-01-28 DIAGNOSIS — Z79890 Hormone replacement therapy (postmenopausal): Secondary | ICD-10-CM | POA: Diagnosis not present

## 2024-01-28 DIAGNOSIS — G4733 Obstructive sleep apnea (adult) (pediatric): Secondary | ICD-10-CM | POA: Insufficient documentation

## 2024-01-28 DIAGNOSIS — Z800 Family history of malignant neoplasm of digestive organs: Secondary | ICD-10-CM | POA: Insufficient documentation

## 2024-01-28 DIAGNOSIS — E291 Testicular hypofunction: Secondary | ICD-10-CM | POA: Insufficient documentation

## 2024-01-28 LAB — CBC WITH DIFFERENTIAL/PLATELET
Abs Immature Granulocytes: 0.02 10*3/uL (ref 0.00–0.07)
Basophils Absolute: 0 10*3/uL (ref 0.0–0.1)
Basophils Relative: 1 %
Eosinophils Absolute: 0.1 10*3/uL (ref 0.0–0.5)
Eosinophils Relative: 2 %
HCT: 54.6 % — ABNORMAL HIGH (ref 39.0–52.0)
Hemoglobin: 18.2 g/dL — ABNORMAL HIGH (ref 13.0–17.0)
Immature Granulocytes: 0 %
Lymphocytes Relative: 17 %
Lymphs Abs: 1.2 10*3/uL (ref 0.7–4.0)
MCH: 29 pg (ref 26.0–34.0)
MCHC: 33.3 g/dL (ref 30.0–36.0)
MCV: 87.1 fL (ref 80.0–100.0)
Monocytes Absolute: 0.8 10*3/uL (ref 0.1–1.0)
Monocytes Relative: 11 %
Neutro Abs: 5 10*3/uL (ref 1.7–7.7)
Neutrophils Relative %: 69 %
Platelets: 206 10*3/uL (ref 150–400)
RBC: 6.27 MIL/uL — ABNORMAL HIGH (ref 4.22–5.81)
RDW: 14.2 % (ref 11.5–15.5)
WBC: 7.2 10*3/uL (ref 4.0–10.5)
nRBC: 0 % (ref 0.0–0.2)

## 2024-01-28 NOTE — Patient Instructions (Signed)

## 2024-01-28 NOTE — Progress Notes (Signed)
 Patient here for his therapeutic phlebotomy- meets parameters- hemoglobin is 18.2. 16g placed in his R AC- started at 1437 and ended at 1445. 5011g removed per order. Offered food and fluids- declined. Declined 30 minute observation. Tolerates his phlebotomies well and VSS-  BP 116/75 (BP Location: Left Arm, Patient Position: Sitting)   Pulse 85   Temp 98.6 F (37 C) (Oral)   Resp 16   SpO2 95%   Ambulatory to the lobby.

## 2024-02-20 ENCOUNTER — Other Ambulatory Visit: Payer: Self-pay | Admitting: Emergency Medicine

## 2024-02-20 DIAGNOSIS — I10 Essential (primary) hypertension: Secondary | ICD-10-CM

## 2024-02-20 NOTE — Telephone Encounter (Signed)
 Your patient

## 2024-02-20 NOTE — Telephone Encounter (Signed)
 Sent to patient's PCP, Dr. Sanda Linger, please.

## 2024-03-12 DIAGNOSIS — G4733 Obstructive sleep apnea (adult) (pediatric): Secondary | ICD-10-CM | POA: Diagnosis not present

## 2024-03-23 DIAGNOSIS — G4733 Obstructive sleep apnea (adult) (pediatric): Secondary | ICD-10-CM | POA: Diagnosis not present

## 2024-04-01 ENCOUNTER — Encounter: Payer: Self-pay | Admitting: Internal Medicine

## 2024-04-01 ENCOUNTER — Inpatient Hospital Stay: Payer: Medicaid Other | Attending: Hematology and Oncology

## 2024-04-01 ENCOUNTER — Inpatient Hospital Stay: Payer: Medicaid Other

## 2024-04-01 VITALS — BP 112/72 | HR 88 | Temp 98.6°F | Resp 18

## 2024-04-01 DIAGNOSIS — D751 Secondary polycythemia: Secondary | ICD-10-CM | POA: Diagnosis not present

## 2024-04-01 LAB — CBC WITH DIFFERENTIAL/PLATELET
Abs Immature Granulocytes: 0.02 10*3/uL (ref 0.00–0.07)
Basophils Absolute: 0 10*3/uL (ref 0.0–0.1)
Basophils Relative: 1 %
Eosinophils Absolute: 0.1 10*3/uL (ref 0.0–0.5)
Eosinophils Relative: 2 %
HCT: 53.8 % — ABNORMAL HIGH (ref 39.0–52.0)
Hemoglobin: 18.6 g/dL — ABNORMAL HIGH (ref 13.0–17.0)
Immature Granulocytes: 0 %
Lymphocytes Relative: 14 %
Lymphs Abs: 0.7 10*3/uL (ref 0.7–4.0)
MCH: 29.2 pg (ref 26.0–34.0)
MCHC: 34.6 g/dL (ref 30.0–36.0)
MCV: 84.6 fL (ref 80.0–100.0)
Monocytes Absolute: 0.7 10*3/uL (ref 0.1–1.0)
Monocytes Relative: 13 %
Neutro Abs: 3.7 10*3/uL (ref 1.7–7.7)
Neutrophils Relative %: 70 %
Platelets: 184 10*3/uL (ref 150–400)
RBC: 6.36 MIL/uL — ABNORMAL HIGH (ref 4.22–5.81)
RDW: 14.2 % (ref 11.5–15.5)
WBC: 5.3 10*3/uL (ref 4.0–10.5)
nRBC: 0 % (ref 0.0–0.2)

## 2024-04-01 NOTE — Progress Notes (Signed)
 Zachary Murray presents today for phlebotomy per MD orders. Phlebotomy procedure started at 1455 and ended at 1502. 528 grams removed. Patient tolerated procedure well. IV needle removed intact. Patient declined 30 minutes observation, Vitals WNL

## 2024-04-22 ENCOUNTER — Other Ambulatory Visit: Payer: Self-pay | Admitting: Internal Medicine

## 2024-04-22 DIAGNOSIS — G250 Essential tremor: Secondary | ICD-10-CM

## 2024-04-27 ENCOUNTER — Ambulatory Visit: Payer: Medicaid Other | Admitting: Internal Medicine

## 2024-04-30 ENCOUNTER — Ambulatory Visit: Admitting: Internal Medicine

## 2024-04-30 ENCOUNTER — Ambulatory Visit (INDEPENDENT_AMBULATORY_CARE_PROVIDER_SITE_OTHER)

## 2024-04-30 ENCOUNTER — Encounter: Payer: Self-pay | Admitting: Internal Medicine

## 2024-04-30 VITALS — BP 104/68 | HR 91 | Temp 98.7°F | Ht 72.0 in | Wt 313.6 lb

## 2024-04-30 DIAGNOSIS — R918 Other nonspecific abnormal finding of lung field: Secondary | ICD-10-CM | POA: Diagnosis not present

## 2024-04-30 DIAGNOSIS — G250 Essential tremor: Secondary | ICD-10-CM | POA: Diagnosis not present

## 2024-04-30 DIAGNOSIS — E118 Type 2 diabetes mellitus with unspecified complications: Secondary | ICD-10-CM

## 2024-04-30 DIAGNOSIS — R0602 Shortness of breath: Secondary | ICD-10-CM | POA: Diagnosis not present

## 2024-04-30 DIAGNOSIS — I950 Idiopathic hypotension: Secondary | ICD-10-CM | POA: Diagnosis not present

## 2024-04-30 DIAGNOSIS — Z7985 Long-term (current) use of injectable non-insulin antidiabetic drugs: Secondary | ICD-10-CM | POA: Diagnosis not present

## 2024-04-30 DIAGNOSIS — E119 Type 2 diabetes mellitus without complications: Secondary | ICD-10-CM

## 2024-04-30 DIAGNOSIS — E785 Hyperlipidemia, unspecified: Secondary | ICD-10-CM | POA: Diagnosis not present

## 2024-04-30 DIAGNOSIS — Z7984 Long term (current) use of oral hypoglycemic drugs: Secondary | ICD-10-CM

## 2024-04-30 DIAGNOSIS — R059 Cough, unspecified: Secondary | ICD-10-CM | POA: Diagnosis not present

## 2024-04-30 DIAGNOSIS — R052 Subacute cough: Secondary | ICD-10-CM | POA: Insufficient documentation

## 2024-04-30 DIAGNOSIS — J189 Pneumonia, unspecified organism: Secondary | ICD-10-CM | POA: Diagnosis not present

## 2024-04-30 DIAGNOSIS — E039 Hypothyroidism, unspecified: Secondary | ICD-10-CM

## 2024-04-30 DIAGNOSIS — I10 Essential (primary) hypertension: Secondary | ICD-10-CM

## 2024-04-30 DIAGNOSIS — R9389 Abnormal findings on diagnostic imaging of other specified body structures: Secondary | ICD-10-CM | POA: Diagnosis not present

## 2024-04-30 LAB — URINALYSIS, ROUTINE W REFLEX MICROSCOPIC
Bilirubin Urine: NEGATIVE
Hgb urine dipstick: NEGATIVE
Leukocytes,Ua: NEGATIVE
Nitrite: NEGATIVE
RBC / HPF: NONE SEEN (ref 0–?)
Specific Gravity, Urine: 1.01 (ref 1.000–1.030)
Total Protein, Urine: NEGATIVE
Urine Glucose: >=1000 — AB
Urobilinogen, UA: 1 (ref 0.0–1.0)
pH: 6 (ref 5.0–8.0)

## 2024-04-30 LAB — MICROALBUMIN / CREATININE URINE RATIO
Creatinine,U: 85.2 mg/dL
Microalb Creat Ratio: 33.8 mg/g — ABNORMAL HIGH (ref 0.0–30.0)
Microalb, Ur: 2.9 mg/dL — ABNORMAL HIGH (ref 0.0–1.9)

## 2024-04-30 MED ORDER — AMOXICILLIN-POT CLAVULANATE 875-125 MG PO TABS: 1.0000 | ORAL_TABLET | Freq: Two times a day (BID) | ORAL | 0 refills | Status: DC

## 2024-04-30 MED ORDER — AMOXICILLIN-POT CLAVULANATE 875-125 MG PO TABS: 1.0000 | ORAL_TABLET | Freq: Two times a day (BID) | ORAL | 0 refills | Status: AC

## 2024-04-30 MED ORDER — HYDROCODONE BIT-HOMATROP MBR 5-1.5 MG/5ML PO SOLN: 5.0000 mL | Freq: Three times a day (TID) | ORAL | 0 refills | Status: AC | PRN

## 2024-04-30 NOTE — Patient Instructions (Signed)
 Community-Acquired Pneumonia, Adult Pneumonia is a lung infection that causes inflammation and the buildup of mucus and fluids in the lungs. This may cause coughing and difficulty breathing. Community-acquired pneumonia is pneumonia that develops in people who are not, and have not recently been, in a hospital or other health care facility. Usually, pneumonia develops as a result of an illness that is caused by a virus, such as the common cold and the flu (influenza). It can also be caused by bacteria or fungi. While the common cold and influenza can pass from person to person (are contagious), pneumonia itself is not considered contagious. What are the causes? This condition may be caused by: Viruses. Bacteria. Fungi. What increases the risk? The following factors may make you more likely to develop this condition: Being over age 67 or having certain medical conditions, such as: A long-term (chronic) disease, such as: chronic obstructive pulmonary disease (COPD), asthma, heart failure, diabetes, or kidney disease. A condition that increases the risk of breathing in (aspirating) mucus and other fluids from your mouth and nose. A weakened body defense system (immune system). Having had your spleen removed (splenectomy). The spleen is the organ that helps fight germs and infections. Not cleaning your teeth and gums well (poor dental hygiene). Using tobacco products. Traveling to places where germs that cause pneumonia are present or being near certain animals or animal habitats that could have germs that cause pneumonia. What are the signs or symptoms? Symptoms of this condition include: A dry cough or a wet (productive) cough. A fever, sweating, or chills. Chest pain, especially when breathing deeply or coughing. Fast breathing, difficulty breathing, or shortness of breath. Tiredness (fatigue) and muscle aches. How is this diagnosed? This condition may be diagnosed based on your medical  history or a physical exam. You may also have tests, including: Imaging, such as a chest X-ray or lung ultrasound. Tests of: The level of oxygen and other gases in your blood. Mucus from your lungs (sputum). Fluid around your lungs (pleural fluid). Your urine. How is this treated? Treatment for this condition depends on many factors, such as the cause of your pneumonia, your medicines, and other medical conditions that you have. For most adults, pneumonia may be treated at home. In some cases, treatment must happen in a hospital and may include: Medicines that are given by mouth (orally) or through an IV, including: Antibiotic medicines, if bacteria caused the pneumonia. Medicines that kill viruses (antiviral medicines), if a virus caused the pneumonia. Oxygen therapy. Severe pneumonia, although rare, may require the following treatments: Mechanical ventilation.This procedure uses a machine to help you breathe if you cannot breathe well on your own or maintain a safe level of blood oxygen. Thoracentesis. This procedure removes any buildup of pleural fluid to help with breathing. Follow these instructions at home:  Medicines Take over-the-counter and prescription medicines only as told by your health care provider. Take cough medicine only if you have trouble sleeping. Cough medicine can prevent your body from removing mucus from your lungs. If you were prescribed antibiotics, take them as told by your health care provider. Do not stop taking the antibiotic even if you start to feel better. Lifestyle     Do not drink alcohol. Do not use any products that contain nicotine  or tobacco. These products include cigarettes, chewing tobacco, and vaping devices, such as e-cigarettes. If you need help quitting, ask your health care provider. Eat a healthy diet. This includes plenty of vegetables, fruits, whole grains, low-fat  dairy products, and lean protein. General instructions Rest a lot and  get at least 8 hours of sleep each night. Sleep in a partly upright position at night. Place a few pillows under your head or sleep in a reclining chair. Return to your normal activities as told by your health care provider. Ask your health care provider what activities are safe for you. Drink enough fluid to keep your urine pale yellow. This helps to thin the mucus in your lungs. If your throat is sore, gargle with a mixture of salt and water 3-4 times a day or as needed. To make salt water, completely dissolve -1 tsp (3-6 g) of salt in 1 cup (237 mL) of warm water. Keep all follow-up visits. How is this prevented? You can lower your risk of developing community-acquired pneumonia by: Getting the pneumonia vaccine. There are different types and schedules of pneumonia vaccines. Ask your health care provider which option is best for you. Consider getting the pneumonia vaccine if: You are older than 60 years of age. You are 84-61 years of age and are receiving cancer treatment, have chronic lung disease, or have other medical conditions that affect your immune system. Ask your health care provider if this applies to you. Getting your influenza vaccine every year. Ask your health care provider which type of vaccine is best for you. Getting regular dental checkups. Washing your hands often with soap and water for at least 20 seconds. If soap and water are not available, use hand sanitizer. Contact a health care provider if: You have a fever. You have trouble sleeping because you cannot control your cough with cough medicine. Get help right away if: Your shortness of breath becomes worse. Your chest pain increases. Your sickness becomes worse, especially if you are an older adult or have a weak immune system. You cough up blood. These symptoms may be an emergency. Get help right away. Call 911. Do not wait to see if the symptoms will go away. Do not drive yourself to the  hospital. Summary Pneumonia is an infection of the lungs. Community-acquired pneumonia develops in people who have not been in the hospital. It can be caused by bacteria, viruses, or fungi. This condition may be treated with antibiotics or antiviral medicines. Severe pneumonia may require a hospital stay and treatment to help with breathing. This information is not intended to replace advice given to you by your health care provider. Make sure you discuss any questions you have with your health care provider. Document Revised: 10/08/2023 Document Reviewed: 01/02/2022 Elsevier Patient Education  2025 [employer].

## 2024-04-30 NOTE — Progress Notes (Unsigned)
 Subjective:  Patient ID: Zachary Murray, male    DOB: [birth year]  Age: 58 y.o. MRN: [id number]  CC: Medical Management of Chronic Issues (6 month follow up. ), Cough (With phlegm for the first 2 weeks. This last week its just been a dry cough. He feels as if his lungs are tired and he now has asthma symptoms. This cough is interfering with his Cpap machine.), and Fever (Low grade off and on . )   [name] presents for f/up ----  Discussed the use of AI scribe software for clinical note transcription with the patient, who gave verbal consent to proceed.  History of Present Illness   Zachary Murray is a 58 year old male with asthma who presents with a persistent cough and respiratory symptoms.  He has been experiencing a persistent cough for the past three weeks. Initially, he had hemoptysis a couple of weeks ago, but this has not recurred. The cough is particularly troublesome at night, affecting his ability to use his CPAP machine, which he has been unable to use twice this week. Since last Friday, the cough has been mostly nonproductive, with occasional phlegm production once or twice a day.  He feels fatigued and experiences shortness of breath, describing a sensation of diminished lung capacity. He also has asthma symptoms, including wheezing. No fever or chills, but he experiences night sweats, which he attributes to panic related to the cough. He experiences persistent dizziness and mild nausea.  He has been taking Ozempic  and Farxiga , and since starting these medications, he has experienced gastrointestinal symptoms such as stomach upset, gas, and sulfur burps. He describes a sensation of balancing on the edge of colitis but manages these symptoms through dietary control. No abdominal pain.  He stopped participating in a clinical trial and discontinued the associated medication on May 1st. He mentions that he was eligible to restart primidone .       Outpatient Medications  Prior to Visit  Medication Sig Dispense Refill   ALPRAZolam  (XANAX ) 0.5 MG tablet TAKE 1 TABLET(0.5 MG) BY MOUTH THREE TIMES DAILY AS NEEDED FOR ANXIETY 270 tablet 0   aspirin  EC 81 MG tablet Take 1 tablet (81 mg total) by mouth daily. Swallow whole.     Blood Glucose Monitoring Suppl (ONETOUCH VERIO FLEX SYSTEM) w/Device KIT Use as instructed to check blood sugar once daily. 1 kit 0   cilostazol  (PLETAL ) 100 MG tablet Take 1 tablet (100 mg total) by mouth 2 (two) times daily. 180 tablet 0   clomiPHENE  (CLOMID ) 50 MG tablet Take 1 tablet (50 mg total) by mouth every 3 (three) days. 16 tablet 1   dapagliflozin  propanediol (FARXIGA ) 10 MG TABS tablet Take 1 tablet (10 mg total) by mouth daily. 90 tablet 0   Lancets (ONETOUCH DELICA PLUS LANCET33G) MISC Use to check blood sugar once a day 100 each 1   meloxicam  (MOBIC ) 7.5 MG tablet Take 1 tablet (7.5 mg total) by mouth daily. (Patient taking differently: Take 7.5 mg by mouth as needed for pain.) 90 tablet 0   OZEMPIC , 1 MG/DOSE, 4 MG/3ML SOPN INJECT 1MG  UNDER THE SKIN ONCE A WEEK ON THE SAME DAY EACH WEEK 6 mL 0   propranolol  (INDERAL ) 40 MG tablet TAKE 1 TABLET(40 MG) BY MOUTH THREE TIMES DAILY 270 tablet 1   Testosterone  20.25 MG/ACT (1.62%) GEL Place 1 Pump onto the skin daily. 75 g 0   levothyroxine  (SYNTHROID ) 75 MCG tablet TAKE 1 TABLET(75 MCG)  BY MOUTH DAILY BEFORE BREAKFAST 90 tablet 1   rosuvastatin  (CRESTOR ) 10 MG tablet TAKE 1 TABLET(10 MG) BY MOUTH DAILY 90 tablet 1   triamterene -hydrochlorothiazide (MAXZIDE-25) 37.5-25 MG tablet TAKE 1 TABLET BY MOUTH DAILY 90 tablet 0   Continuous Glucose Sensor (FREESTYLE LIBRE 3 SENSOR) MISC APPLY 1 SENSOR ON UPPER ARM EVERY 14 DAYS FOR CONTINUOUS GLUCOSE MONITORING (Patient not taking: Reported on 04/30/2024) 2 each 5   No facility-administered medications prior to visit.    ROS Review of Systems  Constitutional:  Positive for fatigue. Negative for appetite change, chills, diaphoresis, fever and  unexpected weight change.  HENT:  Positive for postnasal drip, rhinorrhea, sinus pressure and sinus pain. Negative for facial swelling, hearing loss, nosebleeds, sneezing, sore throat, trouble swallowing and voice change.   Eyes: Negative.   Respiratory:  Positive for apnea, cough and shortness of breath. Negative for wheezing.   Cardiovascular:  Negative for chest pain, palpitations and leg swelling.  Gastrointestinal: Negative.  Negative for abdominal pain, constipation, diarrhea, nausea and vomiting.  Endocrine: Negative.   Genitourinary: Negative.  Negative for difficulty urinating.  Musculoskeletal: Negative.  Negative for arthralgias.  Skin: Negative.   Neurological:  Positive for dizziness, tremors and light-headedness. Negative for weakness, numbness and headaches.  Hematological:  Negative for adenopathy. Does not bruise/bleed easily.  Psychiatric/Behavioral:  Negative for behavioral problems, confusion, decreased concentration, dysphoric mood, sleep disturbance and suicidal ideas. The patient is nervous/anxious.     Objective:  BP 104/68 (BP Location: Left Arm, Patient Position: Sitting, Cuff Size: Large)   Pulse 91   Temp 98.7 F (37.1 C) (Oral)   Ht 6' (1.829 m)   Wt (!) 313 lb 9.6 oz (142.2 kg)   SpO2 94%   BMI 42.53 kg/m   BP Readings from Last 3 Encounters:  04/30/24 104/68  04/01/24 112/72  01/28/24 116/75    Wt Readings from Last 3 Encounters:  04/30/24 (!) 313 lb 9.6 oz (142.2 kg)  11/24/23 (!) 320 lb (145.2 kg)  10/24/23 (!) 321 lb (145.6 kg)    Physical Exam Vitals reviewed.  Constitutional:      General: He is not in acute distress.    Appearance: He is obese. He is not toxic-appearing or diaphoretic.  HENT:     Nose: Nose normal.     Mouth/Throat:     Mouth: Mucous membranes are moist.   Eyes:     General: No scleral icterus.    Conjunctiva/sclera: Conjunctivae normal.    Cardiovascular:     Rate and Rhythm: Normal rate and regular rhythm.      Heart sounds: No murmur heard.    No friction rub. No gallop.     Comments: EKG--- SR with artifact, 93 bpm No LVH, Q waves, or ST/T wave changes  unchanged Pulmonary:     Breath sounds: Examination of the left-lower field reveals rales. Rales present. No decreased breath sounds, wheezing or rhonchi.  Abdominal:     General: Abdomen is protuberant. Bowel sounds are normal. There is no distension.     Palpations: Abdomen is soft. There is no hepatomegaly, splenomegaly or mass.     Tenderness: There is no abdominal tenderness.   Musculoskeletal:     Cervical back: Neck supple.     Right lower leg: No edema.     Left lower leg: No edema.  Lymphadenopathy:     Cervical: No cervical adenopathy.   Skin:    General: Skin is warm and dry.   Neurological:  General: No focal deficit present.     Mental Status: Mental status is at baseline.   Psychiatric:        Mood and Affect: Mood normal.        Behavior: Behavior normal.     Lab Results  Component Value Date   WBC 8.5 04/30/2024   HGB 18.4 (H) 04/30/2024   HCT 56.6 (H) 04/30/2024   PLT 219 04/30/2024   GLUCOSE 142 (H) 04/30/2024   CHOL 96 10/23/2023   TRIG 75.0 10/23/2023   HDL 33.00 (L) 10/23/2023   LDLCALC 48 10/23/2023   ALT 21 04/30/2024   AST 20 04/30/2024   NA 139 04/30/2024   K 3.5 04/30/2024   CL 98 04/30/2024   CREATININE 0.97 04/30/2024   BUN 13 04/30/2024   CO2 30 04/30/2024   TSH 1.92 04/30/2024   PSA 1.14 10/23/2023   INR 1.2 (H) 12/18/2021   HGBA1C 8.0 (H) 04/30/2024   MICROALBUR 2.9 (H) 04/30/2024    VAS US  LOWER EXTREMITY VENOUS REFLUX Result Date: 03/30/2021  Lower Venous Reflux Study Patient Name:  BRAEDEN KENNAN  Date of Exam:   03/30/2021 Medical Rec #: [id number]     Accession #:    [id number] Date of Birth: February 14, 1966     Patient Gender: M Patient Age:   55Y Exam Location:  [facility] Procedure:      VAS US  LOWER EXTREMITY VENOUS REFLUX Referring Phys: [id number] Antony Baumgartner CAIN --------------------------------------------------------------------------------  Indications: Pain, Swelling, and varicosities.  Comparison Study: 02/27/2021: No evidence of deep vein thrombosis bilaterally.                   Age indeterminate venous thrombosis in the bilateral greater                   saphenous veins. Performing Technologist: Basilia Lima  Examination Guidelines: A complete evaluation includes B-mode imaging, spectral Doppler, color Doppler, and power Doppler as needed of all accessible portions of each vessel. Bilateral testing is considered an integral part of a complete examination. Limited examinations for reoccurring indications may be performed as noted. The reflux portion of the exam is performed with the patient in reverse Trendelenburg. Significant venous reflux is defined as >500 ms in the superficial venous system, and >1 second in the deep venous system.  Venous Reflux Times +----------------------+---------+------+-----------+------------+-------------+ RIGHT                 Reflux NoRefluxReflux TimeDiameter cmsComments                                      Yes                                       +----------------------+---------+------+-----------+------------+-------------+ CFV                   no                                                  +----------------------+---------+------+-----------+------------+-------------+ FV mid                no                                                  +----------------------+---------+------+-----------+------------+-------------+  Popliteal             no                                                  +----------------------+---------+------+-----------+------------+-------------+ GSV at [facility]                      yes    >500 ms      0.67                  +----------------------+---------+------+-----------+------------+-------------+ GSV prox thigh                  yes     >500 ms      0.68                  +----------------------+---------+------+-----------+------------+-------------+ GSV mid thigh                   yes    >500 ms      0.45                  +----------------------+---------+------+-----------+------------+-------------+ GSV dist thigh                  yes    >500 ms      0.39                  +----------------------+---------+------+-----------+------------+-------------+ GSV at knee                     yes    >500 ms      0.41    out of fascia +----------------------+---------+------+-----------+------------+-------------+ GSV prox calf                   yes    >500 ms      0.48    out of fascia +----------------------+---------+------+-----------+------------+-------------+ SSV Pop Fossa         no                            0.49                  +----------------------+---------+------+-----------+------------+-------------+ SSV prox calf                   yes    >500 ms      0.39                  +----------------------+---------+------+-----------+------------+-------------+ SSV mid calf          no                            0.25                  +----------------------+---------+------+-----------+------------+-------------+ Distal calf perforator          yes    >500 ms                            +----------------------+---------+------+-----------+------------+-------------+  +-------------------+---------+------+-----------+------------+--------+ LEFT               Reflux NoRefluxReflux TimeDiameter cmsComments  Yes                                  +-------------------+---------+------+-----------+------------+--------+ CFV                          yes   >1 second                      +-------------------+---------+------+-----------+------------+--------+ FV mid             no                                              +-------------------+---------+------+-----------+------------+--------+ Popliteal          no                                             +-------------------+---------+------+-----------+------------+--------+ GSV at SFJ                   yes    >500 ms      0.96             +-------------------+---------+------+-----------+------------+--------+ GSV prox thigh               yes    >500 ms      0.63             +-------------------+---------+------+-----------+------------+--------+ GSV mid thigh                yes    >500 ms      0.46             +-------------------+---------+------+-----------+------------+--------+ GSV dist thigh               yes    >500 ms      0.47             +-------------------+---------+------+-----------+------------+--------+ GSV at knee                  yes    >500 ms      0.55             +-------------------+---------+------+-----------+------------+--------+ GSV prox calf                yes    >500 ms      0.51             +-------------------+---------+------+-----------+------------+--------+ SSV Pop Fossa      no                            0.56             +-------------------+---------+------+-----------+------------+--------+ SSV prox calf      no                            0.41             +-------------------+---------+------+-----------+------------+--------+ SSV mid calf       no  0.33             +-------------------+---------+------+-----------+------------+--------+ Mid calf perforator          yes    >500 ms                       +-------------------+---------+------+-----------+------------+--------+   Summary: Bilateral: - No evidence of deep vein thrombosis seen in the lower extremities, bilaterally, from the common femoral through the popliteal veins.  Right: - Color duplex evaluation of the right lower extremity shows there is thrombus in the distal greater  saphenous vein. - Venous reflux is noted in the right sapheno-femoral junction. - Venous reflux is noted in the right greater saphenous vein in the thigh. - Venous reflux is noted in the right greater saphenous vein in the calf. - Venous reflux is noted in the right short saphenous vein. - Venous reflux is noted in the right calf perforator vein.  Left: - Color duplex evaluation of the left lower extremity shows there is thrombus in the distal greater saphenous vein and proximal greater saphenous vein. - Venous reflux is noted in the left common femoral vein. - Venous reflux is noted in the left sapheno-femoral junction. - Venous reflux is noted in the left greater saphenous vein in the thigh. - Venous reflux is noted in the left greater saphenous vein in the calf. - Venous reflux is noted in the left calf perforator vein.  *See table(s) above for measurements and observations. Electronically signed by Angela Kell MD on 03/30/2021 at 5:04:43 PM.    Final    DG Chest 2 View Result Date: 04/30/2024 CLINICAL DATA:  Three-week history of cough and shortness of breath EXAM: CHEST - 2 VIEW COMPARISON:  Chest radiograph dated 12/08/2014 FINDINGS: Unchanged asymmetric elevation of the right hemidiaphragm. Normal lung volumes. Left mid lung hazy opacity. Left basilar patchy opacity. No pleural effusion or pneumothorax. The heart size and mediastinal contours are within normal limits. No acute osseous abnormality. IMPRESSION: Left mid lung hazy opacity and left basilar patchy opacity, which may represent atelectasis, aspiration, or pneumonia. Electronically Signed   By: Limin  Xu M.D.   On: 04/30/2024 08:42       Assessment & Plan:  Type II diabetes mellitus with manifestations (HCC) -     Basic metabolic panel with GFR; Future -     Hemoglobin A1c; Future -     Microalbumin / creatinine urine ratio; Future -     HM Diabetes Foot Exam  Acquired hypothyroidism -     TSH; Future -     Levothyroxine  Sodium; Take  1 tablet (75 mcg total) by mouth daily before breakfast.  Dispense: 90 tablet; Refill: 1  Severe obesity (BMI >= 40) (HCC) -     Hepatic function panel; Future  Essential hypertension, benign- His BP is over-controlled. Will discontinue the diuretic. -     Basic metabolic panel with GFR; Future -     Hemoglobin A1c; Future  Subacute cough -     DG Chest 2 View; Future  Idiopathic hypotension -     Basic metabolic panel with GFR; Future -     CBC with Differential/Platelet; Future -     Urinalysis, Routine w reflex microscopic; Future -     Hemoglobin A1c; Future -     Hepatic function panel; Future -     Cortisol; Future -     D-dimer, quantitative; Future -     EKG 12-Lead  Pneumonia  of left lower lobe due to infectious organism -     HYDROcodone  Bit-Homatrop MBr; Take 5 mLs by mouth every 8 (eight) hours as needed for up to 7 days for cough.  Dispense: 105 mL; Refill: 0 -     Amoxicillin-Pot Clavulanate; Take 1 tablet by mouth 2 (two) times daily for 10 days.  Dispense: 20 tablet; Refill: 0  Tremor, essential -     Primidone ; Take 1 tablet (50 mg total) by mouth at bedtime.  Dispense: 30 tablet; Refill: 0  Insulin -requiring or dependent type II diabetes mellitus (HCC)  Hyperlipidemia with target LDL less than 130 -     Rosuvastatin  Calcium ; Take 1 tablet (10 mg total) by mouth daily.  Dispense: 90 tablet; Refill: 0     Follow-up: Return in about 3 weeks (around 05/21/2024).  Sandra Crouch, MD

## 2024-05-02 ENCOUNTER — Other Ambulatory Visit: Payer: Self-pay | Admitting: Internal Medicine

## 2024-05-02 DIAGNOSIS — G250 Essential tremor: Secondary | ICD-10-CM

## 2024-05-02 MED ORDER — PRIMIDONE 50 MG PO TABS: 50.0000 mg | ORAL_TABLET | Freq: Every day | ORAL | 0 refills | Status: DC

## 2024-05-03 ENCOUNTER — Ambulatory Visit: Payer: Self-pay | Admitting: Internal Medicine

## 2024-05-03 LAB — CBC WITH DIFFERENTIAL/PLATELET
Absolute Lymphocytes: 1097 {cells}/uL (ref 850–3900)
Absolute Monocytes: 825 {cells}/uL (ref 200–950)
Basophils Absolute: 34 {cells}/uL (ref 0–200)
Basophils Relative: 0.4 %
Eosinophils Absolute: 170 {cells}/uL (ref 15–500)
Eosinophils Relative: 2 %
HCT: 56.6 % — ABNORMAL HIGH (ref 38.5–50.0)
Hemoglobin: 18.4 g/dL — ABNORMAL HIGH (ref 13.2–17.1)
MCH: 29 pg (ref 27.0–33.0)
MCHC: 32.5 g/dL (ref 32.0–36.0)
MCV: 89.3 fL (ref 80.0–100.0)
MPV: 10.1 fL (ref 7.5–12.5)
Monocytes Relative: 9.7 %
Neutro Abs: 6375 {cells}/uL (ref 1500–7800)
Neutrophils Relative %: 75 %
Platelets: 219 10*3/uL (ref 140–400)
RBC: 6.34 10*6/uL — ABNORMAL HIGH (ref 4.20–5.80)
RDW: 14.4 % (ref 11.0–15.0)
Total Lymphocyte: 12.9 %
WBC: 8.5 10*3/uL (ref 3.8–10.8)

## 2024-05-03 LAB — BASIC METABOLIC PANEL WITH GFR
BUN: 13 mg/dL (ref 7–25)
CO2: 30 mmol/L (ref 20–32)
Calcium: 10.2 mg/dL (ref 8.6–10.3)
Chloride: 98 mmol/L (ref 98–110)
Creat: 0.97 mg/dL (ref 0.70–1.35)
Glucose, Bld: 142 mg/dL — ABNORMAL HIGH (ref 65–99)
Potassium: 3.5 mmol/L (ref 3.5–5.3)
Sodium: 139 mmol/L (ref 135–146)
eGFR: 89 mL/min/{1.73_m2} (ref >=60)

## 2024-05-03 LAB — CORTISOL: Cortisol, Plasma: 23.7 ug/dL — ABNORMAL HIGH

## 2024-05-03 LAB — HEMOGLOBIN A1C
Hgb A1c MFr Bld: 8 % — ABNORMAL HIGH (ref <5.7)
Mean Plasma Glucose: 183 mg/dL
eAG (mmol/L): 10.1 mmol/L

## 2024-05-03 LAB — HEPATIC FUNCTION PANEL
AG Ratio: 1.3 (calc) (ref 1.0–2.5)
ALT: 21 U/L (ref 9–46)
AST: 20 U/L (ref 10–35)
Albumin: 4.5 g/dL (ref 3.6–5.1)
Alkaline phosphatase (APISO): 82 U/L (ref 35–144)
Bilirubin, Direct: 0.4 mg/dL — ABNORMAL HIGH (ref 0.0–0.2)
Globulin: 3.4 g/dL (ref 1.9–3.7)
Indirect Bilirubin: 1 mg/dL (ref 0.2–1.2)
Total Bilirubin: 1.4 mg/dL — ABNORMAL HIGH (ref 0.2–1.2)
Total Protein: 7.9 g/dL (ref 6.1–8.1)

## 2024-05-03 LAB — TSH: TSH: 1.92 m[IU]/L (ref 0.40–4.50)

## 2024-05-03 LAB — D-DIMER, QUANTITATIVE: D-Dimer, Quant: 0.33 ug{FEU}/mL (ref <0.50)

## 2024-05-03 MED ORDER — ROSUVASTATIN CALCIUM 10 MG PO TABS: 10.0000 mg | ORAL_TABLET | Freq: Every day | ORAL | 0 refills | Status: DC

## 2024-05-03 MED ORDER — LEVOTHYROXINE SODIUM 75 MCG PO TABS: 75.0000 ug | ORAL_TABLET | Freq: Every day | ORAL | 1 refills | Status: DC

## 2024-05-19 ENCOUNTER — Other Ambulatory Visit: Payer: Self-pay | Admitting: Internal Medicine

## 2024-05-19 DIAGNOSIS — I10 Essential (primary) hypertension: Secondary | ICD-10-CM

## 2024-05-23 ENCOUNTER — Encounter: Payer: Self-pay | Admitting: Internal Medicine

## 2024-05-25 ENCOUNTER — Encounter: Payer: Self-pay | Admitting: Internal Medicine

## 2024-05-25 ENCOUNTER — Telehealth (INDEPENDENT_AMBULATORY_CARE_PROVIDER_SITE_OTHER): Admitting: Internal Medicine

## 2024-05-25 ENCOUNTER — Ambulatory Visit: Payer: Self-pay | Admitting: *Deleted

## 2024-05-25 DIAGNOSIS — E559 Vitamin D deficiency, unspecified: Secondary | ICD-10-CM | POA: Diagnosis not present

## 2024-05-25 DIAGNOSIS — R062 Wheezing: Secondary | ICD-10-CM | POA: Insufficient documentation

## 2024-05-25 DIAGNOSIS — J189 Pneumonia, unspecified organism: Secondary | ICD-10-CM | POA: Diagnosis not present

## 2024-05-25 MED ORDER — LEVOFLOXACIN 500 MG PO TABS: 500.0000 mg | ORAL_TABLET | Freq: Every day | ORAL | 0 refills | Status: DC

## 2024-05-25 MED ORDER — HYDROCODONE BIT-HOMATROP MBR 5-1.5 MG/5ML PO SOLN: 5.0000 mL | Freq: Four times a day (QID) | ORAL | 0 refills | Status: AC | PRN

## 2024-05-25 MED ORDER — PREDNISONE 10 MG PO TABS
ORAL_TABLET | ORAL | 0 refills | Status: DC
Start: 2024-05-25 — End: 2024-07-12

## 2024-05-25 MED ORDER — ALBUTEROL SULFATE HFA 108 (90 BASE) MCG/ACT IN AERS: 2.0000 | INHALATION_SPRAY | Freq: Four times a day (QID) | RESPIRATORY_TRACT | 0 refills | Status: DC | PRN

## 2024-05-25 NOTE — Assessment & Plan Note (Signed)
 Last vitamin D  Lab Results  Component Value Date   VD25OH 94.86 04/23/2022   Stable, cont oral replacement

## 2024-05-25 NOTE — Assessment & Plan Note (Signed)
 Possibly recurring vs other, for levaquin  500 mg every day course, cough med prn, and consider CT chest for any persistence or worsening

## 2024-05-25 NOTE — Progress Notes (Signed)
 Patient ID: Zachary Murray, male   DOB: 13-Feb-1966, 58 y.o.   MRN: [id number]  Virtual Visit via Video Note  I connected with Zachary Murray on 05/25/24 at 11:00 AM EDT by a video enabled telemedicine application and verified that I am speaking with the correct person using two identifiers.  Location of all participants today Patient: at home Provider: at office   I discussed the limitations of evaluation and management by telemedicine and the availability of in person appointments. The patient expressed understanding and agreed to proceed.  History of Present Illness: Here to f/u after recent dx left lower lung CAP by cxr after seen per Dr Joshua 6/13.  Pt did quite well with the augmentin , but now it seems symptoms are worsening again including prod cough, feverish, chest discomfort, sob with wheezing and DOE.  Pt denies orthopnea, PND, increased LE swelling, palpitations, dizziness or syncope.   Pt denies polydipsia, polyuria, or new focal neuro s/s.    Past Medical History:  Diagnosis Date   Degenerative arthritis of left knee 11/21/2021   GAD (generalized anxiety disorder) 10/22/2017   Hyperlipidemia associated with type 2 diabetes mellitus (HCC) 11/19/2013   The 10-year ASCVD risk score Verdon DC Jr., et al., 2013) is: 5.8%    Values used to calculate the score:      Age: 75 years      Sex: Male      Is Non-Hispanic African American: No      Diabetic: Yes      Tobacco smoker: No      Systolic Blood Pressure: 118 mmHg      Is BP treated: Yes      HDL Cholesterol: 65.7 mg/dL      Total Cholesterol: 158 mg/dL   Hypertension    Hypogonadism male 02/11/2018   Hypothyroidism 12/08/2014   Insulin -requiring or dependent type II diabetes mellitus (HCC) 06/05/2022   Irritable bowel syndrome with diarrhea 12/19/2021   Obesity    OSA on CPAP    Peripheral vascular disease (HCC) 05/06/2019   Saphenofemoral venous reflux; 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   Severe obesity (BMI >= 40) (HCC) 09/15/2012   Past Surgical  History:  Procedure Laterality Date   BIOPSY  08/31/2018   Procedure: BIOPSY;  Surgeon: Shila Gustav GAILS, MD;  Location: [facility];  Service: Endoscopy;;   COLONOSCOPY WITH PROPOFOL  N/A 11/12/2016   Procedure: COLONOSCOPY WITH PROPOFOL ;  Surgeon: Gustav GAILS Shila, MD;  Location: [facility];  Service: Endoscopy;  Laterality: N/A;   COLONOSCOPY WITH PROPOFOL  N/A 08/31/2018   Procedure: COLONOSCOPY WITH PROPOFOL ;  Surgeon: Shila Gustav GAILS, MD;  Location: [facility];  Service: Endoscopy;  Laterality: N/A;   POLYPECTOMY  08/31/2018   Procedure: POLYPECTOMY;  Surgeon: Shila Gustav GAILS, MD;  Location: [facility];  Service: Endoscopy;;   WISDOM TOOTH EXTRACTION      reports that he has never smoked. He has quit using smokeless tobacco.  His smokeless tobacco use included chew. He reports that he does not drink alcohol and does not use drugs. family history includes Colon cancer in his father; Colon polyps in his mother; Hypertension in his father; Thyroid  cancer in his mother; Thyroid  disease in his maternal grandmother, mother, and sister. Allergies  Allergen Reactions   Metformin  And Related Diarrhea   Wellbutrin  [Bupropion ] Other (See Comments)    tremors   Mounjaro  [Tirzepatide ] Rash   Lisinopril  Cough   Current Outpatient Medications on File Prior to Visit  Medication Sig Dispense Refill   ALPRAZolam  (  XANAX ) 0.5 MG tablet TAKE 1 TABLET(0.5 MG) BY MOUTH THREE TIMES DAILY AS NEEDED FOR ANXIETY 270 tablet 0   aspirin  EC 81 MG tablet Take 1 tablet (81 mg total) by mouth daily. Swallow whole.     Blood Glucose Monitoring Suppl (ONETOUCH VERIO FLEX SYSTEM) w/Device KIT Use as instructed to check blood sugar once daily. 1 kit 0   cilostazol  (PLETAL ) 100 MG tablet Take 1 tablet (100 mg total) by mouth 2 (two) times daily. 180 tablet 0   clomiPHENE  (CLOMID ) 50 MG tablet Take 1 tablet (50 mg total) by mouth every 3 (three) days. 16 tablet 1   Continuous Glucose Sensor (FREESTYLE  LIBRE 3 SENSOR) MISC APPLY 1 SENSOR ON UPPER ARM EVERY 14 DAYS FOR CONTINUOUS GLUCOSE MONITORING 2 each 5   dapagliflozin  propanediol (FARXIGA ) 10 MG TABS tablet Take 1 tablet (10 mg total) by mouth daily. 90 tablet 0   Lancets (ONETOUCH DELICA PLUS LANCET33G) MISC Use to check blood sugar once a day 100 each 1   levothyroxine  (SYNTHROID ) 75 MCG tablet Take 1 tablet (75 mcg total) by mouth daily before breakfast. 90 tablet 1   meloxicam  (MOBIC ) 7.5 MG tablet Take 1 tablet (7.5 mg total) by mouth daily. (Patient taking differently: Take 7.5 mg by mouth as needed for pain.) 90 tablet 0   OZEMPIC , 1 MG/DOSE, 4 MG/3ML SOPN INJECT 1MG  UNDER THE SKIN ONCE A WEEK ON THE SAME DAY EACH WEEK 6 mL 0   primidone  (MYSOLINE ) 50 MG tablet Take 1 tablet (50 mg total) by mouth at bedtime. 30 tablet 0   propranolol  (INDERAL ) 40 MG tablet TAKE 1 TABLET(40 MG) BY MOUTH THREE TIMES DAILY 270 tablet 1   rosuvastatin  (CRESTOR ) 10 MG tablet Take 1 tablet (10 mg total) by mouth daily. 90 tablet 0   Testosterone  20.25 MG/ACT (1.62%) GEL Place 1 Pump onto the skin daily. 75 g 0   No current facility-administered medications on file prior to visit.   Observations/Objective: Alert, mild ill, appropriate mood and affect, resps normal, cn 2-12 intact, moves all 4s, no visible rash or swelling Lab Results  Component Value Date   WBC 8.5 04/30/2024   HGB 18.4 (H) 04/30/2024   HCT 56.6 (H) 04/30/2024   PLT 219 04/30/2024   GLUCOSE 142 (H) 04/30/2024   CHOL 96 10/23/2023   TRIG 75.0 10/23/2023   HDL 33.00 (L) 10/23/2023   LDLCALC 48 10/23/2023   ALT 21 04/30/2024   AST 20 04/30/2024   NA 139 04/30/2024   K 3.5 04/30/2024   CL 98 04/30/2024   CREATININE 0.97 04/30/2024   BUN 13 04/30/2024   CO2 30 04/30/2024   TSH 1.92 04/30/2024   PSA 1.14 10/23/2023   INR 1.2 (H) 12/18/2021   HGBA1C 8.0 (H) 04/30/2024   MICROALBUR 2.9 (H) 04/30/2024   Assessment and Plan: See notes  Follow Up Instructions: See notes   I  discussed the assessment and treatment plan with the patient. The patient was provided an opportunity to ask questions and all were answered. The patient agreed with the plan and demonstrated an understanding of the instructions.   The patient was advised to call back or seek an in-person evaluation if the symptoms worsen or if the condition fails to improve as anticipated.   Lynwood Rush, MD

## 2024-05-25 NOTE — Patient Instructions (Signed)
 Please take all new medication as prescribed

## 2024-05-25 NOTE — Telephone Encounter (Signed)
 FYI Only or Action Required?: FYI only for provider.  Patient was last seen in primary care on 04/30/2024 by Joshua Debby CROME, MD.  Called Nurse Triage reporting Recurrent Pneumonia.  Symptoms began several weeks ago.  Interventions attempted: Prescription medications: post treatment antibiotic for recent pneumonia .  Symptoms are: gradually worsening.  Triage Disposition: See PCP When Office is Open (Within 3 Days)  Patient/caregiver understands and will follow disposition?: Yes  Reason for Disposition  [1] Completed antibiotic treatment AND [2] cough not improved  Answer Assessment - Initial Assessment Questions Patient is out town in [place]- virtual visit scheduled  1. SYMPTOM: What's the main symptom you're concerned about? (e.g., breathing difficulty, fever, weakness)     Severe cough 2. ONSET: When did the  symptoms  start?     04/30/24 3. BETTER-SAME-WORSE: Are you getting better, staying the same, or getting worse compared to the day you were discharged?     Symptoms never went away- over weekend- patient got worse 4. BREATHING DIFFICULTY: Are you having any difficulty breathing? If Yes, ask: How bad is it?  (e.g., none, mild, moderate, severe)   - MILD: No SOB at rest, mild SOB with walking, speaks normally in sentences, can lie down, no retractions, pulse < 100.    - MODERATE: SOB at rest, SOB with minimal exertion and prefers to sit, cannot lie down flat, speaks in phrases, mild retractions, audible wheezing, pulse 100-120.    - SEVERE: Very SOB at rest, speaks in single words, struggling to breathe, sitting hunched forward, retractions, pulse > 120      Feels like he has asthma, fatigue with walking 5. FEVER: Do you have a fever? If Yes, ask: What is your temperature, how was it measured, and when did it start?     Yes- 99.1 6. SPUTUM: Describe the color of your sputum (clear, white, yellow, green, blood-tinged)     No color 7. DIAGNOSIS  CONFIRMATION: When was the pneumonia diagnosed? By whom?     6/13- chest xray 8. ANTIBIOTIC: Are you taking an antibiotic?  If Yes, ask: Which one? When was it started?     Was given antibiotic- finished- did improve, amoxicillin   9. OTHER TREATMENT: Are you receiving any other treatment for the pneumonia? (e.g., albuterol  nebulizer, oxygen) If Yes, ask: How often? and Does it help?     no 10. HOSPITAL ADMISSION: Were you hospitalized for this pneumonia? If Yes, ask: When were you discharged home from the hospital?       no  Protocols used: Pneumonia Follow-up Call-A-[facility]   Copied from CRM [id number]. Topic: Clinical - Red Word Triage >> May 25, 2024  9:15 AM Essie A wrote: Red Word that prompted transfer to Nurse Triage: Patient has pneumonia symptoms that never went away after seeing his pcp on 04/30/24.  Still has severe cough, shortness of breath, lethargy and slight temperature.

## 2024-05-25 NOTE — Assessment & Plan Note (Signed)
 Also for prednisone  taper, and albuterol  hfa prn

## 2024-05-27 ENCOUNTER — Ambulatory Visit: Admitting: Internal Medicine

## 2024-05-28 ENCOUNTER — Encounter: Payer: Self-pay | Admitting: Internal Medicine

## 2024-05-28 ENCOUNTER — Other Ambulatory Visit: Payer: Self-pay

## 2024-05-28 DIAGNOSIS — F411 Generalized anxiety disorder: Secondary | ICD-10-CM

## 2024-05-28 DIAGNOSIS — I739 Peripheral vascular disease, unspecified: Secondary | ICD-10-CM

## 2024-05-28 DIAGNOSIS — E291 Testicular hypofunction: Secondary | ICD-10-CM

## 2024-05-29 MED ORDER — DAPAGLIFLOZIN PROPANEDIOL 10 MG PO TABS: 10.0000 mg | ORAL_TABLET | Freq: Every day | ORAL | 0 refills | Status: DC

## 2024-05-29 MED ORDER — ALPRAZOLAM 0.5 MG PO TABS: ORAL_TABLET | ORAL | 0 refills | Status: DC

## 2024-05-29 MED ORDER — OZEMPIC (1 MG/DOSE) 4 MG/3ML ~~LOC~~ SOPN: 1.0000 mg | PEN_INJECTOR | SUBCUTANEOUS | 0 refills | Status: DC

## 2024-05-29 MED ORDER — TESTOSTERONE 20.25 MG/ACT (1.62%) TD GEL: 1.0000 | Freq: Every day | TRANSDERMAL | 0 refills | Status: DC

## 2024-05-29 MED ORDER — CILOSTAZOL 100 MG PO TABS: 100.0000 mg | ORAL_TABLET | Freq: Two times a day (BID) | ORAL | 0 refills | Status: DC

## 2024-05-31 ENCOUNTER — Telehealth: Payer: Self-pay | Admitting: Internal Medicine

## 2024-05-31 NOTE — Telephone Encounter (Signed)
 Copied from CRM [id number]. Topic: Appointments - Scheduling Inquiry for Clinic >> May 31, 2024  9:41 AM Jasmin G wrote: Reason for CRM: Patient is trying to get a Tetanus vaccination. Please call back ASAP.  ---  Does pt need orders for this? TDW

## 2024-06-01 ENCOUNTER — Encounter: Payer: Self-pay | Admitting: Hematology and Oncology

## 2024-06-01 ENCOUNTER — Inpatient Hospital Stay: Payer: Medicaid Other

## 2024-06-01 ENCOUNTER — Inpatient Hospital Stay: Payer: Medicaid Other | Attending: Hematology and Oncology | Admitting: Hematology and Oncology

## 2024-06-01 VITALS — BP 124/72 | HR 87 | Temp 98.7°F | Resp 18 | Ht 72.0 in | Wt 306.4 lb

## 2024-06-01 VITALS — BP 111/69 | HR 81 | Resp 15

## 2024-06-01 DIAGNOSIS — D751 Secondary polycythemia: Secondary | ICD-10-CM

## 2024-06-01 LAB — CBC WITH DIFFERENTIAL/PLATELET
Abs Immature Granulocytes: 0.03 K/uL (ref 0.00–0.07)
Basophils Absolute: 0.1 K/uL (ref 0.0–0.1)
Basophils Relative: 1 %
Eosinophils Absolute: 0.1 K/uL (ref 0.0–0.5)
Eosinophils Relative: 2 %
HCT: 51.1 % (ref 39.0–52.0)
Hemoglobin: 17.3 g/dL — ABNORMAL HIGH (ref 13.0–17.0)
Immature Granulocytes: 1 %
Lymphocytes Relative: 18 %
Lymphs Abs: 1.1 K/uL (ref 0.7–4.0)
MCH: 28.1 pg (ref 26.0–34.0)
MCHC: 33.9 g/dL (ref 30.0–36.0)
MCV: 83.1 fL (ref 80.0–100.0)
Monocytes Absolute: 0.6 K/uL (ref 0.1–1.0)
Monocytes Relative: 10 %
Neutro Abs: 4.4 K/uL (ref 1.7–7.7)
Neutrophils Relative %: 68 %
Platelets: 216 K/uL (ref 150–400)
RBC: 6.15 MIL/uL — ABNORMAL HIGH (ref 4.22–5.81)
RDW: 13.9 % (ref 11.5–15.5)
WBC: 6.4 K/uL (ref 4.0–10.5)
nRBC: 0 % (ref 0.0–0.2)

## 2024-06-01 NOTE — Patient Instructions (Signed)

## 2024-06-01 NOTE — Progress Notes (Signed)
 [facility] OFFICE PROGRESS NOTE  Patient Care Team: Joshua Debby CROME, MD as PCP - General (Internal Medicine) Anner Alm ORN, MD as PCP - Cardiology (Cardiology) [facility], [facility]  Assessment & Plan Erythrocytosis The patient had multiple causes for erythrocytosis including obstructive sleep apnea as well as testosterone  replacement therapy In the past, the patient was able to donate blood regularly but he was disqualified due to clinical trial The patient decided to discontinue clinical trial He wants a phlebotomy today and then after that, he will resume blood donation I will defer to future follow-up with his primary care doctor  No orders of the defined types were placed in this encounter.    Almarie Bedford, MD  INTERVAL HISTORY: he returns for surveillance follow-up for secondary erythrocytosis Since last time I saw him, he has received phlebotomy every other month He tolerated that well The patient informed me that he will discontinue phlebotomy after today's visit as he plan to go back to donate blood in the future as he has discontinue his participation in clinical trial  PHYSICAL EXAMINATION: ECOG PERFORMANCE STATUS: 0 - Asymptomatic  Vitals:   06/01/24 1200  BP: 124/72  Pulse: 87  Resp: 18  Temp: 98.7 F (37.1 C)  SpO2: 92%   Filed Weights   06/01/24 1200  Weight: (!) 306 lb 6.4 oz (139 kg)    Relevant data reviewed during this visit included CBC

## 2024-06-01 NOTE — Assessment & Plan Note (Addendum)
 The patient had multiple causes for erythrocytosis including obstructive sleep apnea as well as testosterone  replacement therapy In the past, the patient was able to donate blood regularly but he was disqualified due to clinical trial The patient decided to discontinue clinical trial He wants a phlebotomy today and then after that, he will resume blood donation I will defer to future follow-up with his primary care doctor

## 2024-06-01 NOTE — Progress Notes (Signed)
 Zachary Murray presents today for phlebotomy per MD orders. 16G phlebotomy kit used in LAC.  Phlebotomy procedure started at 1352 and ended at 1418. 518 cc removed. Patient tolerated procedure well. IV needle removed intact. Pt declined 30 minute observation period post phlebotomy. VSS. Pt offered food and drink before discharge.

## 2024-06-02 ENCOUNTER — Other Ambulatory Visit: Payer: Self-pay | Admitting: Internal Medicine

## 2024-06-02 DIAGNOSIS — E291 Testicular hypofunction: Secondary | ICD-10-CM

## 2024-06-02 MED ORDER — TESTOSTERONE 20.25 MG/ACT (1.62%) TD GEL: 2.0000 | Freq: Every day | TRANSDERMAL | 0 refills | Status: DC

## 2024-06-03 ENCOUNTER — Ambulatory Visit: Admitting: Internal Medicine

## 2024-06-10 ENCOUNTER — Encounter: Payer: Self-pay | Admitting: Hematology and Oncology

## 2024-06-10 NOTE — Telephone Encounter (Signed)
 Patient has been made aware and gave a verbal understanding.

## 2024-06-10 NOTE — Telephone Encounter (Signed)
 He can go to a local pharmacy to get this

## 2024-06-10 NOTE — Telephone Encounter (Signed)
 IS it okay for him to come in for a nurse visit and get his vaccine ?

## 2024-06-16 ENCOUNTER — Other Ambulatory Visit: Payer: Self-pay | Admitting: Internal Medicine

## 2024-06-16 ENCOUNTER — Encounter: Payer: Self-pay | Admitting: Internal Medicine

## 2024-06-16 DIAGNOSIS — I10 Essential (primary) hypertension: Secondary | ICD-10-CM

## 2024-06-16 MED ORDER — TRIAMTERENE-HCTZ 37.5-25 MG PO CAPS: 1.0000 | ORAL_CAPSULE | Freq: Every day | ORAL | 0 refills | Status: DC

## 2024-06-17 ENCOUNTER — Ambulatory Visit: Admitting: Internal Medicine

## 2024-06-17 ENCOUNTER — Ambulatory Visit (INDEPENDENT_AMBULATORY_CARE_PROVIDER_SITE_OTHER)

## 2024-06-17 ENCOUNTER — Encounter: Payer: Self-pay | Admitting: Internal Medicine

## 2024-06-17 VITALS — BP 124/84 | HR 83 | Temp 98.6°F | Resp 16 | Ht 72.0 in | Wt 312.2 lb

## 2024-06-17 DIAGNOSIS — R3912 Poor urinary stream: Secondary | ICD-10-CM

## 2024-06-17 DIAGNOSIS — Z23 Encounter for immunization: Secondary | ICD-10-CM

## 2024-06-17 DIAGNOSIS — N401 Enlarged prostate with lower urinary tract symptoms: Secondary | ICD-10-CM

## 2024-06-17 DIAGNOSIS — J189 Pneumonia, unspecified organism: Secondary | ICD-10-CM

## 2024-06-17 DIAGNOSIS — R531 Weakness: Secondary | ICD-10-CM | POA: Diagnosis not present

## 2024-06-17 DIAGNOSIS — G250 Essential tremor: Secondary | ICD-10-CM

## 2024-06-17 DIAGNOSIS — R059 Cough, unspecified: Secondary | ICD-10-CM | POA: Diagnosis not present

## 2024-06-17 DIAGNOSIS — R9389 Abnormal findings on diagnostic imaging of other specified body structures: Secondary | ICD-10-CM | POA: Diagnosis not present

## 2024-06-17 LAB — URINALYSIS, ROUTINE W REFLEX MICROSCOPIC
Bilirubin Urine: NEGATIVE
Hgb urine dipstick: NEGATIVE
Ketones, ur: NEGATIVE
Leukocytes,Ua: NEGATIVE
Nitrite: NEGATIVE
RBC / HPF: NONE SEEN (ref 0–?)
Specific Gravity, Urine: 1.015 (ref 1.000–1.030)
Total Protein, Urine: NEGATIVE
Urine Glucose: >=1000 — AB
Urobilinogen, UA: 1 (ref 0.0–1.0)
pH: 7 (ref 5.0–8.0)

## 2024-06-17 LAB — PSA: PSA: 2.27 ng/mL (ref 0.10–4.00)

## 2024-06-17 MED ORDER — BOOSTRIX 5-2.5-18.5 LF-MCG/0.5 IM SUSP
0.5000 mL | Freq: Once | INTRAMUSCULAR | 0 refills | Status: AC
Start: 2024-06-17 — End: 2024-06-17

## 2024-06-17 NOTE — Progress Notes (Signed)
 Subjective:  Patient ID: Zachary Murray, male    DOB: 1966-01-29  Age: 58 y.o. MRN: [id number]  CC: Acute Visit   [name] presents for f/up   ---  Discussed the use of AI scribe software for clinical note transcription with the patient, who gave verbal consent to proceed.  History of Present Illness Zachary Murray is a 58 year old male who presents with weakness in his legs and thumbs.  He has been experiencing weakness in his legs, starting in the upper thigh and moving inside the knee, as well as weakness in his thumbs. The weakness is persistent but fluctuates in severity. No numbness or tingling except at night, and no trouble with vision or speech. The weakness began approximately one month ago, around the same time he developed pneumonia.  He had a recent episode of pneumonia for which he underwent a tele-visit and was prescribed another round of antibiotics. The symptoms cleared up after the treatment. No current symptoms of pneumonia such as fever, chills, cough, or wheezing.  He experiences dizziness and lightheadedness, which he attributes to his medication, primidone , rather than his blood pressure. He has difficulty balancing the dosage of primidone  to manage his tremors without feeling 'drugged' or experiencing increased tremors. Reducing the dose to half a pill worsens his tremors, while a full pill causes him to feel unsteady, especially in the heat.  He mentions experiencing some swelling, which is somewhat controlled but tends to increase with summer heat and humidity.  He reports restricted urine flow and a weak stream, noting that certain medications have caused similar symptoms in the past.     Outpatient Medications Prior to Visit  Medication Sig Dispense Refill   albuterol  (VENTOLIN  HFA) 108 (90 Base) MCG/ACT inhaler Inhale 2 puffs into the lungs every 6 (six) hours as needed for wheezing or shortness of breath. 8 g 0   ALPRAZolam  (XANAX ) 0.5 MG tablet  TAKE 1 TABLET(0.5 MG) BY MOUTH THREE TIMES DAILY AS NEEDED FOR ANXIETY 270 tablet 0   aspirin  EC 81 MG tablet Take 1 tablet (81 mg total) by mouth daily. Swallow whole.     Blood Glucose Monitoring Suppl (ONETOUCH VERIO FLEX SYSTEM) w/Device KIT Use as instructed to check blood sugar once daily. 1 kit 0   cilostazol  (PLETAL ) 100 MG tablet Take 1 tablet (100 mg total) by mouth 2 (two) times daily. 180 tablet 0   clomiPHENE  (CLOMID ) 50 MG tablet Take 1 tablet (50 mg total) by mouth every 3 (three) days. 16 tablet 1   Continuous Glucose Sensor (FREESTYLE LIBRE 3 SENSOR) MISC APPLY 1 SENSOR ON UPPER ARM EVERY 14 DAYS FOR CONTINUOUS GLUCOSE MONITORING 2 each 5   dapagliflozin  propanediol (FARXIGA ) 10 MG TABS tablet Take 1 tablet (10 mg total) by mouth daily. 90 tablet 0   Lancets (ONETOUCH DELICA PLUS LANCET33G) MISC Use to check blood sugar once a day 100 each 1   levofloxacin  (LEVAQUIN ) 500 MG tablet Take 1 tablet (500 mg total) by mouth daily. 10 tablet 0   levothyroxine  (SYNTHROID ) 75 MCG tablet Take 1 tablet (75 mcg total) by mouth daily before breakfast. 90 tablet 1   meloxicam  (MOBIC ) 7.5 MG tablet Take 1 tablet (7.5 mg total) by mouth daily. (Patient taking differently: Take 7.5 mg by mouth as needed for pain.) 90 tablet 0   predniSONE  (DELTASONE ) 10 MG tablet 3 tabs by mouth per day for 3 days,2tabs per day for 3 days,1tab per day  for 3 days 18 tablet 0   propranolol  (INDERAL ) 40 MG tablet TAKE 1 TABLET(40 MG) BY MOUTH THREE TIMES DAILY 270 tablet 1   rosuvastatin  (CRESTOR ) 10 MG tablet Take 1 tablet (10 mg total) by mouth daily. 90 tablet 0   Semaglutide , 1 MG/DOSE, (OZEMPIC , 1 MG/DOSE,) 4 MG/3ML SOPN Inject 1 mg into the skin once a week. 9 mL 0   Testosterone  20.25 MG/ACT (1.62%) GEL Place 2 Pump onto the skin daily. 150 g 0   triamterene -hydrochlorothiazide (DYAZIDE) 37.5-25 MG capsule Take 1 each (1 capsule total) by mouth daily. 90 capsule 0   primidone  (MYSOLINE ) 50 MG tablet Take 1  tablet (50 mg total) by mouth at bedtime. 30 tablet 0   No facility-administered medications prior to visit.    ROS Review of Systems  Constitutional:  Negative for appetite change, chills, diaphoresis and fever.  HENT: Negative.    Respiratory:  Negative for cough, chest tightness, shortness of breath and wheezing.   Cardiovascular:  Positive for leg swelling. Negative for chest pain and palpitations.  Gastrointestinal: Negative.  Negative for abdominal pain, constipation, diarrhea, nausea and vomiting.  Endocrine: Negative for polyuria.  Genitourinary:  Positive for difficulty urinating. Negative for dysuria and hematuria.  Musculoskeletal: Negative.   Skin: Negative.   Neurological:  Positive for dizziness, tremors, weakness, light-headedness and numbness. Negative for syncope and speech difficulty.  Hematological:  Negative for adenopathy. Does not bruise/bleed easily.  Psychiatric/Behavioral:  Positive for dysphoric mood. Negative for confusion, decreased concentration and suicidal ideas. The patient is nervous/anxious.     Objective:  BP 124/84 (BP Location: Left Arm, Patient Position: Sitting, Cuff Size: Normal)   Pulse 83   Temp 98.6 F (37 C) (Oral)   Resp 16   Ht 6' (1.829 m)   Wt (!) 312 lb 3.2 oz (141.6 kg)   SpO2 96%   BMI 42.34 kg/m   BP Readings from Last 3 Encounters:  06/17/24 124/84  06/01/24 111/69  06/01/24 124/72    Wt Readings from Last 3 Encounters:  06/17/24 (!) 312 lb 3.2 oz (141.6 kg)  06/01/24 (!) 306 lb 6.4 oz (139 kg)  04/30/24 (!) 313 lb 9.6 oz (142.2 kg)    Physical Exam Vitals reviewed.  Constitutional:      General: He is not in acute distress.    Appearance: He is not toxic-appearing or diaphoretic.  HENT:     Nose: Nose normal.     Mouth/Throat:     Mouth: Mucous membranes are moist.  Eyes:     General: No scleral icterus.    Conjunctiva/sclera: Conjunctivae normal.  Cardiovascular:     Rate and Rhythm: Normal rate and  regular rhythm.     Heart sounds: No murmur heard.    No friction rub. No gallop.  Pulmonary:     Effort: Pulmonary effort is normal.     Breath sounds: No stridor. No wheezing, rhonchi or rales.  Abdominal:     General: Abdomen is protuberant. Bowel sounds are decreased. There is no distension.     Palpations: Abdomen is soft. There is no hepatomegaly or splenomegaly.     Tenderness: There is no abdominal tenderness. There is no guarding or rebound.  Musculoskeletal:        General: No swelling.     Cervical back: Neck supple.     Right lower leg: Edema (trace pitting) present.     Left lower leg: Edema (trace pitting) present.  Lymphadenopathy:  Cervical: No cervical adenopathy.  Skin:    General: Skin is warm and dry.  Neurological:     General: No focal deficit present.     Mental Status: He is alert.     Lab Results  Component Value Date   WBC 6.4 06/01/2024   HGB 17.3 (H) 06/01/2024   HCT 51.1 06/01/2024   PLT 216 06/01/2024   GLUCOSE 142 (H) 04/30/2024   CHOL 96 10/23/2023   TRIG 75.0 10/23/2023   HDL 33.00 (L) 10/23/2023   LDLCALC 48 10/23/2023   ALT 21 04/30/2024   AST 20 04/30/2024   NA 139 04/30/2024   K 3.5 04/30/2024   CL 98 04/30/2024   CREATININE 0.97 04/30/2024   BUN 13 04/30/2024   CO2 30 04/30/2024   TSH 1.92 04/30/2024   PSA 2.27 06/17/2024   INR 1.2 (H) 12/18/2021   HGBA1C 8.0 (H) 04/30/2024   MICROALBUR 2.9 (H) 04/30/2024    VAS US  LOWER EXTREMITY VENOUS REFLUX Result Date: 03/30/2021  Lower Venous Reflux Study Patient Name:  KASE SHUGHART  Date of Exam:   03/30/2021 Medical Rec #: [id number]     Accession #:    [id number] Date of Birth: 10-22-1966     Patient Gender: M Patient Age:   55Y Exam Location:  [facility] Procedure:      VAS US  LOWER EXTREMITY VENOUS REFLUX Referring Phys: [id number] PENNE BRUCKNER CAIN --------------------------------------------------------------------------------  Indications: Pain, Swelling,  and varicosities.  Comparison Study: 02/27/2021: No evidence of deep vein thrombosis bilaterally.                   Age indeterminate venous thrombosis in the bilateral greater                   saphenous veins. Performing Technologist: Duwaine Bastos  Examination Guidelines: A complete evaluation includes B-mode imaging, spectral Doppler, color Doppler, and power Doppler as needed of all accessible portions of each vessel. Bilateral testing is considered an integral part of a complete examination. Limited examinations for reoccurring indications may be performed as noted. The reflux portion of the exam is performed with the patient in reverse Trendelenburg. Significant venous reflux is defined as >500 ms in the superficial venous system, and >1 second in the deep venous system.  Venous Reflux Times +----------------------+---------+------+-----------+------------+-------------+ RIGHT                 Reflux NoRefluxReflux TimeDiameter cmsComments                                      Yes                                       +----------------------+---------+------+-----------+------------+-------------+ CFV                   no                                                  +----------------------+---------+------+-----------+------------+-------------+ FV mid                no                                                  +----------------------+---------+------+-----------+------------+-------------+  Popliteal             no                                                  +----------------------+---------+------+-----------+------------+-------------+ GSV at [facility]                      yes    >500 ms      0.67                  +----------------------+---------+------+-----------+------------+-------------+ GSV prox thigh                  yes    >500 ms      0.68                  +----------------------+---------+------+-----------+------------+-------------+ GSV  mid thigh                   yes    >500 ms      0.45                  +----------------------+---------+------+-----------+------------+-------------+ GSV dist thigh                  yes    >500 ms      0.39                  +----------------------+---------+------+-----------+------------+-------------+ GSV at knee                     yes    >500 ms      0.41    out of fascia +----------------------+---------+------+-----------+------------+-------------+ GSV prox calf                   yes    >500 ms      0.48    out of fascia +----------------------+---------+------+-----------+------------+-------------+ SSV Pop Fossa         no                            0.49                  +----------------------+---------+------+-----------+------------+-------------+ SSV prox calf                   yes    >500 ms      0.39                  +----------------------+---------+------+-----------+------------+-------------+ SSV mid calf          no                            0.25                  +----------------------+---------+------+-----------+------------+-------------+ Distal calf perforator          yes    >500 ms                            +----------------------+---------+------+-----------+------------+-------------+  +-------------------+---------+------+-----------+------------+--------+ LEFT               Reflux NoRefluxReflux TimeDiameter cmsComments  Yes                                  +-------------------+---------+------+-----------+------------+--------+ CFV                          yes   >1 second                      +-------------------+---------+------+-----------+------------+--------+ FV mid             no                                             +-------------------+---------+------+-----------+------------+--------+ Popliteal          no                                              +-------------------+---------+------+-----------+------------+--------+ GSV at SFJ                   yes    >500 ms      0.96             +-------------------+---------+------+-----------+------------+--------+ GSV prox thigh               yes    >500 ms      0.63             +-------------------+---------+------+-----------+------------+--------+ GSV mid thigh                yes    >500 ms      0.46             +-------------------+---------+------+-----------+------------+--------+ GSV dist thigh               yes    >500 ms      0.47             +-------------------+---------+------+-----------+------------+--------+ GSV at knee                  yes    >500 ms      0.55             +-------------------+---------+------+-----------+------------+--------+ GSV prox calf                yes    >500 ms      0.51             +-------------------+---------+------+-----------+------------+--------+ SSV Pop Fossa      no                            0.56             +-------------------+---------+------+-----------+------------+--------+ SSV prox calf      no                            0.41             +-------------------+---------+------+-----------+------------+--------+ SSV mid calf       no  0.33             +-------------------+---------+------+-----------+------------+--------+ Mid calf perforator          yes    >500 ms                       +-------------------+---------+------+-----------+------------+--------+   Summary: Bilateral: - No evidence of deep vein thrombosis seen in the lower extremities, bilaterally, from the common femoral through the popliteal veins.  Right: - Color duplex evaluation of the right lower extremity shows there is thrombus in the distal greater saphenous vein. - Venous reflux is noted in the right sapheno-femoral junction. - Venous reflux is noted in the right greater saphenous vein in the  thigh. - Venous reflux is noted in the right greater saphenous vein in the calf. - Venous reflux is noted in the right short saphenous vein. - Venous reflux is noted in the right calf perforator vein.  Left: - Color duplex evaluation of the left lower extremity shows there is thrombus in the distal greater saphenous vein and proximal greater saphenous vein. - Venous reflux is noted in the left common femoral vein. - Venous reflux is noted in the left sapheno-femoral junction. - Venous reflux is noted in the left greater saphenous vein in the thigh. - Venous reflux is noted in the left greater saphenous vein in the calf. - Venous reflux is noted in the left calf perforator vein.  *See table(s) above for measurements and observations. Electronically signed by Penne Colorado MD on 03/30/2021 at 5:04:43 PM.    Final     No results found.    Assessment & Plan:   Pneumonia of left lower lobe due to infectious organism- This has resolved. -     DG Chest 2 View; Future  Need for prophylactic vaccination with combined diphtheria-tetanus-pertussis (DTP) vaccine -     Boostrix ; Inject 0.5 mLs into the muscle once for 1 dose.  Dispense: 0.5 mL; Refill: 0  Weakness -     Acetylcholine receptor, binding; Future -     Striated muscle antibody; Future -     Ambulatory referral to Neurology  Benign prostatic hyperplasia with weak urinary stream -     PSA; Future -     Urinalysis, Routine w reflex microscopic; Future  Tremor, essential -     Ambulatory referral to Neurology     Follow-up: Return in about 4 months (around 10/17/2024).  Debby Molt, MD

## 2024-06-17 NOTE — Patient Instructions (Signed)

## 2024-06-18 ENCOUNTER — Other Ambulatory Visit: Payer: Self-pay | Admitting: Internal Medicine

## 2024-06-18 DIAGNOSIS — G250 Essential tremor: Secondary | ICD-10-CM

## 2024-06-22 ENCOUNTER — Other Ambulatory Visit: Payer: Self-pay | Admitting: Internal Medicine

## 2024-06-22 ENCOUNTER — Ambulatory Visit: Payer: Self-pay | Admitting: Internal Medicine

## 2024-06-22 DIAGNOSIS — G250 Essential tremor: Secondary | ICD-10-CM

## 2024-06-22 LAB — ACETYLCHOLINE RECEPTOR, BINDING: A CHR BINDING ABS: <0.3 nmol/L

## 2024-06-22 LAB — STRIATED MUSCLE ANTIBODY: STRIATED MUSCLE AB SCREEN: NEGATIVE

## 2024-06-22 NOTE — Telephone Encounter (Signed)
 Last OV 06/17/24 Next OV not scheduled  Last refill 05/02/24 Qty #30/0

## 2024-06-22 NOTE — Telephone Encounter (Signed)
 Copied from CRM #[id number]. Topic: Clinical - Medication Refill >> Jun 22, 2024 10:31 AM Pinkey ORN wrote: Medication: primidone  (MYSOLINE ) 50 MG tablet  Has the patient contacted their pharmacy? Yes (Agent: If no, request that the patient contact the pharmacy for the refill. If patient does not wish to contact the pharmacy document the reason why and proceed with request.) (Agent: If yes, when and what did the pharmacy advise?)  This is the patient's preferred pharmacy:  [id number] - [place], [place] - [address] AT [facility] [facility] [address] [place] [address] Phone: [phone] Fax: [phone]  Is this the correct pharmacy for this prescription? Yes If no, delete pharmacy and type the correct one.   Has the prescription been filled recently? No  Is the patient out of the medication? Yes  Has the patient been seen for an appointment in the last year OR does the patient have an upcoming appointment? Yes  Can we respond through MyChart? Yes  Agent: Please be advised that Rx refills may take up to 3 business days. We ask that you follow-up with your pharmacy.

## 2024-06-23 DIAGNOSIS — G4733 Obstructive sleep apnea (adult) (pediatric): Secondary | ICD-10-CM | POA: Diagnosis not present

## 2024-06-30 ENCOUNTER — Other Ambulatory Visit: Payer: Self-pay | Admitting: Internal Medicine

## 2024-06-30 DIAGNOSIS — E291 Testicular hypofunction: Secondary | ICD-10-CM

## 2024-07-01 ENCOUNTER — Encounter: Payer: Self-pay | Admitting: Neurology

## 2024-07-02 NOTE — Progress Notes (Unsigned)
 Assessment/Plan:  Tremor, essential  -pt is interested in surgical interventions.  Discussed DBS and focused ultrasound.  Discussed r/b/se of each.  He is interested in hearing more about each of these.  Refer to Dr. Leellen.    Subjective:   Zachary Murray was seen today in neurologic consultation at the request of Joshua Debby CROME, MD.  The consultation is for the evaluation of tremor and weakness.  I have previously seen the patient, but it has been over 4 years.  Medical records are reviewed.  The patient first was seen here in 2019, at which point he had tremor for a few years.  He noted that he may have had tremor longer, but he thought that alcohol may have been masking the tremor, as he was previously a heavy drinker.  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  His mother had head tremor and paternal GM had tremor.  He does think that stress and fatigue worsen the tremor.  Caffeine does not seem to worsen the tremor and may actually improve it.  The patient was previously on gabapentin  with side effects.  He tried propranolol  40 mg 3 times per day with some benefit.  He is still on that medication.  When I last saw him, he was only on primidone , 50 mg, half a tablet at bedtime.  It got me to the point where I was barely functional.  He reported that higher dosages caused some type of electric shock in his shoulder.  He was referred back for several reasons, one of which was dizziness and lightheadedness.  He attributes this to the primidone , as opposed to the propranolol  or his blood pressure.  He notes that when he takes a half a pill of the primidone  his tremors worsen, while a full pill causes him to feel unsteady.  He reports that last year he participated in a clinical trial for ET and he took a study drug called Ulixacaltamide  He initially thought it was helpful x few weeks (he wasn't told if he had the main drug but he thinks it was), but ended up stopping the medication due to  brain fog and sexual SE after 7 months.  He stopped taking the primidone  while on the study drug.  He ended up restarting 50 mg, 1/2 tablet and thought that it made tremor worse and so he increased it to 1 tablet at night but then it seemed to cause brain fog again.  He states that he hasn't really needed to work for the last 20 years but needs to go back to work now.  He asks about focused ultrasound.      PREVIOUS MEDICATIONS:Gabapentin  (caused anger); propranolol  40 mg tid; Ulixacaltamide (in trial - he believes he was in the non-placebo arm but not confirmed)  ALLERGIES:   Allergies  Allergen Reactions   Metformin  And Related Diarrhea   Wellbutrin  [Bupropion ] Other (See Comments)    tremors   Mounjaro  [Tirzepatide ] Rash   Lisinopril  Cough    CURRENT MEDICATIONS:  Outpatient Encounter Medications as of 07/05/2024  Medication Sig   ALPRAZolam  (XANAX ) 0.5 MG tablet TAKE 1 TABLET(0.5 MG) BY MOUTH THREE TIMES DAILY AS NEEDED FOR ANXIETY   aspirin  EC 81 MG tablet Take 1 tablet (81 mg total) by mouth daily. Swallow whole.   Blood Glucose Monitoring Suppl (ONETOUCH VERIO FLEX SYSTEM) w/Device KIT Use as instructed to check blood sugar once daily.   cilostazol  (PLETAL ) 100 MG tablet Take  1 tablet (100 mg total) by mouth 2 (two) times daily.   clomiPHENE  (CLOMID ) 50 MG tablet Take 1 tablet (50 mg total) by mouth every 3 (three) days.   Continuous Glucose Sensor (FREESTYLE LIBRE 3 SENSOR) MISC APPLY 1 SENSOR ON UPPER ARM EVERY 14 DAYS FOR CONTINUOUS GLUCOSE MONITORING   dapagliflozin  propanediol (FARXIGA ) 10 MG TABS tablet Take 1 tablet (10 mg total) by mouth daily.   Lancets (ONETOUCH DELICA PLUS LANCET33G) MISC Use to check blood sugar once a day   levothyroxine  (SYNTHROID ) 75 MCG tablet Take 1 tablet (75 mcg total) by mouth daily before breakfast.   meloxicam  (MOBIC ) 7.5 MG tablet Take 1 tablet (7.5 mg total) by mouth daily. (Patient taking differently: Take 7.5 mg by mouth as needed for  pain.)   primidone  (MYSOLINE ) 50 MG tablet TAKE 1 TABLET(50 MG) BY MOUTH AT BEDTIME   propranolol  (INDERAL ) 40 MG tablet TAKE 1 TABLET(40 MG) BY MOUTH THREE TIMES DAILY   rosuvastatin  (CRESTOR ) 10 MG tablet Take 1 tablet (10 mg total) by mouth daily.   Semaglutide , 1 MG/DOSE, (OZEMPIC , 1 MG/DOSE,) 4 MG/3ML SOPN Inject 1 mg into the skin once a week.   Testosterone  20.25 MG/ACT (1.62%) GEL PLACE 2 PUMPS ONTO SKIN DAILY   triamterene -hydrochlorothiazide (DYAZIDE) 37.5-25 MG capsule Take 1 each (1 capsule total) by mouth daily.   albuterol  (VENTOLIN  HFA) 108 (90 Base) MCG/ACT inhaler Inhale 2 puffs into the lungs every 6 (six) hours as needed for wheezing or shortness of breath.   levofloxacin  (LEVAQUIN ) 500 MG tablet Take 1 tablet (500 mg total) by mouth daily.   predniSONE  (DELTASONE ) 10 MG tablet 3 tabs by mouth per day for 3 days,2tabs per day for 3 days,1tab per day for 3 days   No facility-administered encounter medications on file as of 07/05/2024.    Objective:   PHYSICAL EXAMINATION:    VITALS:   Vitals:   07/05/24 1221  BP: 118/74  Pulse: 94  SpO2: 93%  Weight: (!) 310 lb (140.6 kg)  Height: 6' (1.829 m)    GEN:  Normal appears male in no acute distress.  Appears stated age. HEENT:  Normocephalic, atraumatic. The mucous membranes are moist. The superficial temporal arteries are without ropiness or tenderness. Cardiovascular: Regular rate and rhythm. Lungs: Clear to auscultation bilaterally. Neck/Heme: There are no carotid bruits noted bilaterally.  NEUROLOGICAL: Orientation:  The patient is alert and oriented x 3.   Cranial nerves: There is good facial symmetry.  Extraocular muscles are intact and visual fields are full to confrontational testing. Speech is fluent and clear. Soft palate rises symmetrically and there is no tongue deviation. Hearing is intact to conversational tone. Tone: Tone is good throughout. Sensation: Sensation is intact to light touch and pinprick  throughout (facial, trunk, extremities). Vibration is intact at the bilateral big toe. There is no extinction with double simultaneous stimulation. There is no sensory dermatomal level identified. Coordination:  The patient has no difficulty with RAM's or FNF bilaterally. Motor: Strength is 5/5 in the bilateral upper and lower extremities.  Shoulder shrug is equal and symmetric. There is no pronator drift.  There are no fasciculations noted. Gait and Station: The patient is able to ambulate without difficulty.  Abnormal movements: There is head tremor in the yes direction.  There is mild postural tremor and there is moderate intention tremor.  He has significant trouble with Archimedes spirals.  This is much worse than prior visits.        Total time  spent on today's visit was 48 minutes, including both face-to-face time and nonface-to-face time.  Time included that spent on review of records (prior notes available to me/labs/imaging if pertinent), discussing treatment and goals, answering patient's questions and coordinating care.   Cc:  Joshua Debby CROME, MD

## 2024-07-05 ENCOUNTER — Encounter: Payer: Self-pay | Admitting: Neurology

## 2024-07-05 ENCOUNTER — Other Ambulatory Visit: Payer: Self-pay

## 2024-07-05 ENCOUNTER — Ambulatory Visit: Admitting: Neurology

## 2024-07-05 VITALS — BP 118/74 | HR 94 | Ht 72.0 in | Wt 310.0 lb

## 2024-07-05 DIAGNOSIS — G250 Essential tremor: Secondary | ICD-10-CM | POA: Diagnosis not present

## 2024-07-11 ENCOUNTER — Encounter: Payer: Self-pay | Admitting: Internal Medicine

## 2024-07-11 ENCOUNTER — Telehealth

## 2024-07-12 ENCOUNTER — Ambulatory Visit: Payer: Self-pay

## 2024-07-12 ENCOUNTER — Telehealth: Admitting: Family Medicine

## 2024-07-12 ENCOUNTER — Ambulatory Visit: Admitting: Neurology

## 2024-07-12 ENCOUNTER — Encounter: Payer: Self-pay | Admitting: Family Medicine

## 2024-07-12 ENCOUNTER — Encounter: Admitting: Family Medicine

## 2024-07-12 VITALS — Ht 72.0 in | Wt 310.0 lb

## 2024-07-12 DIAGNOSIS — E291 Testicular hypofunction: Secondary | ICD-10-CM

## 2024-07-12 DIAGNOSIS — U071 COVID-19: Secondary | ICD-10-CM | POA: Diagnosis not present

## 2024-07-12 NOTE — Telephone Encounter (Signed)
 FYI Only or Action Required?: FYI only for provider.  Patient was last seen in primary care on 06/17/2024 by Joshua Debby CROME, MD.  Called Nurse Triage reporting Covid Positive.  Symptoms began several days ago.  Interventions attempted: OTC medications: Tylenol .  Symptoms are: gradually worsening.  Triage Disposition: Call PCP Within 24 Hours  Patient/caregiver understands and will follow disposition?: Yes  Copied from CRM #[id number]. Topic: Clinical - Medication Question >> Jul 12, 2024  9:18 AM Antwanette L wrote: Reason for CRM: Patient tested positive for Covid yesterday. The patient took an at home test. The patient is experiencing severe headaches and congestion. Patient is requesting Paxlovid . Patient is requesting a callback at [phone] Reason for Disposition  [1] HIGH RISK patient (e.g., weak immune system, age > 64 years, obesity with BMI 30 or higher, pregnant, chronic lung disease or other chronic medical condition) AND [2] COVID symptoms (e.g., cough, fever)  (Exceptions: Already seen by PCP and no new or worsening symptoms.)  Answer Assessment - Initial Assessment Questions 1. COVID-19 DIAGNOSIS: How do you know that you have COVID? (e.g., positive lab test or self-test, diagnosed by doctor or NP/PA, symptoms after exposure).     Home test  2. COVID-19 EXPOSURE: Was there any known exposure to COVID before the symptoms began? CDC Definition of close contact: within 6 feet (2 meters) for a total of 15 minutes or more over a 24-hour period.      Unsure of but knew that there was a possibility  3. ONSET: When did the COVID-19 symptoms start?      2 days ago on Saturday   4. WORST SYMPTOM: What is your worst symptom? (e.g., cough, fever, shortness of breath, muscle aches)    Congestion  5. COUGH: Do you have a cough? If Yes, ask: How bad is the cough?       Yes  6. FEVER: Do you have a fever? If Yes, ask: What is your temperature, how was it measured,  and when did it start?     Low grade fever, 100.95F was the highest  7. RESPIRATORY STATUS: Describe your breathing? (e.g., normal; shortness of breath, wheezing, unable to speak)      No shortness of breath  8. BETTER-SAME-WORSE: Are you getting better, staying the same or getting worse compared to yesterday?  If getting worse, ask, In what way?     Getting worse  9. OTHER SYMPTOMS: Do you have any other symptoms?  (e.g., chills, fatigue, headache, loss of smell or taste, muscle pain, sore throat)     headache  10. HIGH RISK DISEASE: Do you have any chronic medical problems? (e.g., asthma, heart or lung disease, weak immune system, obesity, etc.)       Obesity  11. VACCINE: Have you had the COVID-19 vaccine? If Yes, ask: Which one, how many shots, when did you get it?       Yes, had the booster in May  12. PREGNANCY: Is there any chance you are pregnant? When was your last menstrual period?       N/a  13. O2 SATURATION MONITOR:  Do you use an oxygen saturation monitor (pulse oximeter) at home? If Yes, ask What is your reading (oxygen level) today? What is your usual oxygen saturation reading? (e.g., 95%)       No  Protocols used: Coronavirus (COVID-19) Diagnosed or Suspected-A-AH

## 2024-07-12 NOTE — Progress Notes (Unsigned)
 Virtual Visit via Video Note  I connected with Zachary Murray on 07/12/24 at  2:40 PM EDT by a video enabled telemedicine application and verified that I am speaking with the correct person using two identifiers.  Patient Location: Home Provider Location: Office/Clinic  I discussed the limitations, risks, security, and privacy concerns of performing an evaluation and management service by video and the availability of in person appointments. I also discussed with the patient that there may be a patient responsible charge related to this service. The patient expressed understanding and agreed to proceed.  Subjective: PCP: Joshua Debby CROME, MD  Chief Complaint  Patient presents with  . Covid Positive    Home test - pos on yesterday  S/s - started Sat afternoon : HA, cough, runny nose, head congestion, ST, low grade fever (100.0)  Note - freq gets PNA     Patient complains of {BJSYMPTOMS:22746}. He denies {BJSYMPTOMS:22746}. Onset of symptoms was {numbers; 0-10:33138} {time units:11} ago, {clinical course - history:17::unchanged} since that time. He {hydration history:15378}. Evaluation to date: {uri eval:14242::none}. Treatment to date: {uri tx:14268}. He has a history of ***. He {Action; does/does not:19097} smoke.   Discussed the use of AI scribe software for clinical note transcription with the patient, who gave verbal consent to proceed.  History of Present Illness   Zachary Murray is a 58 year old male who presents with COVID-19 symptoms.  He tested positive for COVID-19 yesterday after experiencing symptoms that began two days ago. His symptoms include headache, cough, rhinorrhea, nasal congestion, pharyngitis, and a low-grade fever. The symptoms are primarily affecting his head, with no significant chest involvement.  He is currently taking Mucinex and Tylenol  to manage his symptoms and is ensuring adequate fluid intake. He has a history of using a CPAP machine for sleep,  but he is unable to use it due to his current symptoms, which is causing significant sleep disturbances.  He has a history of pneumonia, which is relevant given his current respiratory symptoms. No shortness of breath is reported. He is not currently working.       ROS: Per HPI  Current Outpatient Medications:  .  ALPRAZolam  (XANAX ) 0.5 MG tablet, TAKE 1 TABLET(0.5 MG) BY MOUTH THREE TIMES DAILY AS NEEDED FOR ANXIETY, Disp: 270 tablet, Rfl: 0 .  aspirin  EC 81 MG tablet, Take 1 tablet (81 mg total) by mouth daily. Swallow whole., Disp: , Rfl:  .  Blood Glucose Monitoring Suppl (ONETOUCH VERIO FLEX SYSTEM) w/Device KIT, Use as instructed to check blood sugar once daily., Disp: 1 kit, Rfl: 0 .  cilostazol  (PLETAL ) 100 MG tablet, Take 1 tablet (100 mg total) by mouth 2 (two) times daily., Disp: 180 tablet, Rfl: 0 .  clomiPHENE  (CLOMID ) 50 MG tablet, Take 1 tablet (50 mg total) by mouth every 3 (three) days., Disp: 16 tablet, Rfl: 1 .  Continuous Glucose Sensor (FREESTYLE LIBRE 3 SENSOR) MISC, APPLY 1 SENSOR ON UPPER ARM EVERY 14 DAYS FOR CONTINUOUS GLUCOSE MONITORING, Disp: 2 each, Rfl: 5 .  dapagliflozin  propanediol (FARXIGA ) 10 MG TABS tablet, Take 1 tablet (10 mg total) by mouth daily., Disp: 90 tablet, Rfl: 0 .  Lancets (ONETOUCH DELICA PLUS LANCET33G) MISC, Use to check blood sugar once a day, Disp: 100 each, Rfl: 1 .  levothyroxine  (SYNTHROID ) 75 MCG tablet, Take 1 tablet (75 mcg total) by mouth daily before breakfast., Disp: 90 tablet, Rfl: 1 .  meloxicam  (MOBIC ) 7.5 MG tablet, Take 1 tablet (7.5 mg  total) by mouth daily., Disp: 90 tablet, Rfl: 0 .  primidone  (MYSOLINE ) 50 MG tablet, TAKE 1 TABLET(50 MG) BY MOUTH AT BEDTIME, Disp: 30 tablet, Rfl: 0 .  propranolol  (INDERAL ) 40 MG tablet, TAKE 1 TABLET(40 MG) BY MOUTH THREE TIMES DAILY, Disp: 270 tablet, Rfl: 1 .  rosuvastatin  (CRESTOR ) 10 MG tablet, Take 1 tablet (10 mg total) by mouth daily., Disp: 90 tablet, Rfl: 0 .  Semaglutide , 1  MG/DOSE, (OZEMPIC , 1 MG/DOSE,) 4 MG/3ML SOPN, Inject 1 mg into the skin once a week., Disp: 9 mL, Rfl: 0 .  Testosterone  20.25 MG/ACT (1.62%) GEL, PLACE 2 PUMPS ONTO SKIN DAILY, Disp: 150 g, Rfl: 3 .  triamterene -hydrochlorothiazide (DYAZIDE) 37.5-25 MG capsule, Take 1 each (1 capsule total) by mouth daily., Disp: 90 capsule, Rfl: 0  Observations/Objective: Today's Vitals   07/12/24 1255  Weight: (!) 310 lb (140.6 kg)  Height: 6' (1.829 m)   Physical Exam Constitutional:      General: He is not in acute distress.    Appearance: Normal appearance. He is not ill-appearing or toxic-appearing.  Eyes:     General: No scleral icterus.       Right eye: No discharge.        Left eye: No discharge.     Conjunctiva/sclera: Conjunctivae normal.  Pulmonary:     Effort: Pulmonary effort is normal. No respiratory distress.  Neurological:     Mental Status: He is alert and oriented to person, place, and time.  Psychiatric:        Mood and Affect: Mood normal.        Behavior: Behavior normal.        Thought Content: Thought content normal.        Judgment: Judgment normal.     Assessment & Plan    Follow Up Instructions: No follow-ups on file.   I discussed the assessment and treatment plan with the patient. The patient was provided an opportunity to ask questions, and all were answered. The patient agreed with the plan and demonstrated an understanding of the instructions.   The patient was advised to call back or seek an in-person evaluation if the symptoms worsen or if the condition fails to improve as anticipated.  The above assessment and management plan was discussed with the patient. The patient verbalized understanding of and has agreed to the management plan.   Niki JULIANNA Rung, FNP

## 2024-07-13 ENCOUNTER — Other Ambulatory Visit: Payer: Self-pay | Admitting: Internal Medicine

## 2024-07-13 DIAGNOSIS — U071 COVID-19: Secondary | ICD-10-CM | POA: Insufficient documentation

## 2024-07-13 MED ORDER — NIRMATRELVIR/RITONAVIR (PAXLOVID)TABLET: 3.0000 | ORAL_TABLET | Freq: Two times a day (BID) | ORAL | 0 refills | Status: AC

## 2024-07-13 NOTE — Telephone Encounter (Signed)
 Copied from CRM [id number]. Topic: Clinical - Medication Question >> Jul 13, 2024  2:31 PM Frederich PARAS wrote: Reason for CRM: Pt wants to speak nurse, he w was precribed paxlovid , and pt is taking  primidone , pt wants to  know if he can mix the 2 of those medications . Pt callback # is [phone]

## 2024-07-13 NOTE — Telephone Encounter (Signed)
 It may lower your primodone level but nothing serious It is more important to treat COVID

## 2024-07-13 NOTE — Telephone Encounter (Signed)
 Pt states that he has not pick up the paxlovid  yet but is calling due to reading that there is an contraindication between the Paxlovid  and Primodone. Pt is questions should he take the two together. NT also instructed pt to check with pharmacy.  Please advise

## 2024-07-14 NOTE — Telephone Encounter (Signed)
 This is being handled in a telephone call.

## 2024-07-14 NOTE — Telephone Encounter (Signed)
 Patient has been made aware of Dr. Joshua Comments.  It may lower your primodone level but nothing serious It is more important to treat COVID. He gave a verbal understanding.

## 2024-07-20 ENCOUNTER — Encounter: Payer: Self-pay | Admitting: Internal Medicine

## 2024-07-20 NOTE — Progress Notes (Signed)
 This encounter was created in error - please disregard.

## 2024-07-22 DIAGNOSIS — E1165 Type 2 diabetes mellitus with hyperglycemia: Secondary | ICD-10-CM | POA: Diagnosis not present

## 2024-07-22 DIAGNOSIS — F411 Generalized anxiety disorder: Secondary | ICD-10-CM | POA: Diagnosis not present

## 2024-07-22 DIAGNOSIS — G250 Essential tremor: Secondary | ICD-10-CM | POA: Diagnosis not present

## 2024-07-22 DIAGNOSIS — Z01818 Encounter for other preprocedural examination: Secondary | ICD-10-CM | POA: Diagnosis not present

## 2024-07-22 DIAGNOSIS — R251 Tremor, unspecified: Secondary | ICD-10-CM | POA: Diagnosis not present

## 2024-07-23 ENCOUNTER — Other Ambulatory Visit: Payer: Self-pay

## 2024-07-23 DIAGNOSIS — G250 Essential tremor: Secondary | ICD-10-CM

## 2024-07-23 MED ORDER — PRIMIDONE 50 MG PO TABS: 50.0000 mg | ORAL_TABLET | Freq: Every day | ORAL | 1 refills | Status: DC

## 2024-07-27 ENCOUNTER — Encounter: Payer: Self-pay | Admitting: Internal Medicine

## 2024-07-27 ENCOUNTER — Ambulatory Visit: Admitting: Internal Medicine

## 2024-07-27 VITALS — BP 136/70 | HR 91 | Temp 98.3°F | Ht 72.0 in | Wt 309.2 lb

## 2024-07-27 DIAGNOSIS — Z23 Encounter for immunization: Secondary | ICD-10-CM

## 2024-07-27 DIAGNOSIS — J0100 Acute maxillary sinusitis, unspecified: Secondary | ICD-10-CM | POA: Diagnosis not present

## 2024-07-27 DIAGNOSIS — I10 Essential (primary) hypertension: Secondary | ICD-10-CM

## 2024-07-27 MED ORDER — AMOXICILLIN-POT CLAVULANATE 875-125 MG PO TABS: 1.0000 | ORAL_TABLET | Freq: Two times a day (BID) | ORAL | 0 refills | Status: AC

## 2024-07-27 NOTE — Progress Notes (Signed)
 Subjective:  Patient ID: Zachary Murray, male    DOB: 01/10/1966  Age: 58 y.o. MRN: [id number]  CC: Nasal Congestion (Clots of mucus, Bloody occasionally ) and Head congestion  (Patient states that last week when he had a head CT they could pick up on the congestion that he has in his SINUS area.)   [name] presents for f/up ----  Discussed the use of AI scribe software for clinical note transcription with the patient, who gave verbal consent to proceed.  History of Present Illness Zachary Murray is a 58 year old male who presents with nasal congestion and balance issues following a recent COVID-19 infection.  He had COVID-19 around August 25th or 26th. After recovering from the initial symptoms, he began experiencing nasal congestion and the expulsion of medium to dark yellow clots from his nose, which were sometimes bloody. The clots were particularly bloody yesterday but have since cleared up, although congestion persists. He reports that a CT scan was performed last Thursday to measure skull thickness for potential focused ultrasound candidacy, and he was told there was a blockage.  He denies any coughing, fever, chills, or ear involvement. However, he has experienced balance issues over the past few days, which he describes as not serious but noticeable. He has not fallen due to these balance issues.  He mentions that his legs were swollen after spending all day in the yard yesterday. No abdominal pain or issues with Augmentin , an antibiotic he has taken before without trouble.     Outpatient Medications Prior to Visit  Medication Sig Dispense Refill   ALPRAZolam  (XANAX ) 0.5 MG tablet TAKE 1 TABLET(0.5 MG) BY MOUTH THREE TIMES DAILY AS NEEDED FOR ANXIETY 270 tablet 0   aspirin  EC 81 MG tablet Take 1 tablet (81 mg total) by mouth daily. Swallow whole.     Blood Glucose Monitoring Suppl (ONETOUCH VERIO FLEX SYSTEM) w/Device KIT Use as instructed to check blood sugar once  daily. 1 kit 0   cilostazol  (PLETAL ) 100 MG tablet Take 1 tablet (100 mg total) by mouth 2 (two) times daily. 180 tablet 0   clomiPHENE  (CLOMID ) 50 MG tablet Take 1 tablet (50 mg total) by mouth every 3 (three) days. 16 tablet 1   Continuous Glucose Sensor (FREESTYLE LIBRE 3 SENSOR) MISC APPLY 1 SENSOR ON UPPER ARM EVERY 14 DAYS FOR CONTINUOUS GLUCOSE MONITORING 2 each 5   dapagliflozin  propanediol (FARXIGA ) 10 MG TABS tablet Take 1 tablet (10 mg total) by mouth daily. 90 tablet 0   Lancets (ONETOUCH DELICA PLUS LANCET33G) MISC Use to check blood sugar once a day 100 each 1   levothyroxine  (SYNTHROID ) 75 MCG tablet Take 1 tablet (75 mcg total) by mouth daily before breakfast. 90 tablet 1   meloxicam  (MOBIC ) 7.5 MG tablet Take 1 tablet (7.5 mg total) by mouth daily. 90 tablet 0   primidone  (MYSOLINE ) 50 MG tablet Take 1 tablet (50 mg total) by mouth at bedtime. 90 tablet 1   propranolol  (INDERAL ) 40 MG tablet TAKE 1 TABLET(40 MG) BY MOUTH THREE TIMES DAILY 270 tablet 1   rosuvastatin  (CRESTOR ) 10 MG tablet Take 1 tablet (10 mg total) by mouth daily. 90 tablet 0   Semaglutide , 1 MG/DOSE, (OZEMPIC , 1 MG/DOSE,) 4 MG/3ML SOPN Inject 1 mg into the skin once a week. 9 mL 0   Testosterone  20.25 MG/ACT (1.62%) GEL PLACE 2 PUMPS ONTO SKIN DAILY 150 g 3   triamterene -hydrochlorothiazide (DYAZIDE) 37.5-25 MG capsule  Take 1 each (1 capsule total) by mouth daily. 90 capsule 0   No facility-administered medications prior to visit.    ROS Review of Systems  Constitutional:  Negative for appetite change, chills, diaphoresis, fatigue and fever.  HENT:  Positive for congestion, nosebleeds, postnasal drip, rhinorrhea, sinus pressure and sinus pain. Negative for facial swelling, sore throat and trouble swallowing.   Eyes: Negative.   Respiratory: Negative.  Negative for cough, chest tightness, shortness of breath and wheezing.   Cardiovascular:  Negative for chest pain, palpitations and leg swelling.   Gastrointestinal:  Negative for abdominal pain, blood in stool, diarrhea, nausea and vomiting.  Endocrine: Negative.   Genitourinary: Negative.  Negative for difficulty urinating.  Musculoskeletal:  Positive for gait problem.  Skin: Negative.   Neurological:  Negative for dizziness and weakness.  Hematological:  Negative for adenopathy. Does not bruise/bleed easily.  Psychiatric/Behavioral:  Negative for sleep disturbance and suicidal ideas. The patient is nervous/anxious.     Objective:  BP 136/70 (BP Location: Right Arm, Patient Position: Sitting, Cuff Size: Normal)   Pulse 91   Temp 98.3 F (36.8 C) (Oral)   Ht 6' (1.829 m)   Wt (!) 309 lb 3.2 oz (140.3 kg)   SpO2 96%   BMI 41.94 kg/m   BP Readings from Last 3 Encounters:  07/27/24 136/70  07/05/24 118/74  06/17/24 124/84    Wt Readings from Last 3 Encounters:  07/27/24 (!) 309 lb 3.2 oz (140.3 kg)  07/12/24 (!) 310 lb (140.6 kg)  07/05/24 (!) 310 lb (140.6 kg)    Physical Exam Vitals reviewed.  Constitutional:      Appearance: Normal appearance.  HENT:     Right Ear: Hearing, tympanic membrane, ear canal and external ear normal.     Left Ear: Tympanic membrane, ear canal and external ear normal.  Eyes:     General: No scleral icterus.    Conjunctiva/sclera: Conjunctivae normal.  Cardiovascular:     Rate and Rhythm: Normal rate and regular rhythm.     Heart sounds: No murmur heard.    No friction rub. No gallop.  Pulmonary:     Effort: Pulmonary effort is normal.     Breath sounds: No stridor. No wheezing, rhonchi or rales.  Abdominal:     General: Abdomen is protuberant. Bowel sounds are normal. There is no distension.     Palpations: Abdomen is soft. There is no hepatomegaly, splenomegaly or mass.     Tenderness: There is no abdominal tenderness. There is no guarding or rebound.  Musculoskeletal:        General: Normal range of motion.     Cervical back: Neck supple.     Right lower leg: No edema.      Left lower leg: No edema.  Lymphadenopathy:     Cervical: No cervical adenopathy.  Skin:    General: Skin is warm and dry.  Neurological:     Mental Status: He is alert. Mental status is at baseline.     Lab Results  Component Value Date   WBC 6.4 06/01/2024   HGB 17.3 (H) 06/01/2024   HCT 51.1 06/01/2024   PLT 216 06/01/2024   GLUCOSE 142 (H) 04/30/2024   CHOL 96 10/23/2023   TRIG 75.0 10/23/2023   HDL 33.00 (L) 10/23/2023   LDLCALC 48 10/23/2023   ALT 21 04/30/2024   AST 20 04/30/2024   NA 139 04/30/2024   K 3.5 04/30/2024   CL 98 04/30/2024   CREATININE  0.97 04/30/2024   BUN 13 04/30/2024   CO2 30 04/30/2024   TSH 1.92 04/30/2024   PSA 2.27 06/17/2024   INR 1.2 (H) 12/18/2021   HGBA1C 8.0 (H) 04/30/2024   MICROALBUR 2.9 (H) 04/30/2024    VAS US  LOWER EXTREMITY VENOUS REFLUX Result Date: 03/30/2021  Lower Venous Reflux Study Patient Name:  JERIS ROSER  Date of Exam:   03/30/2021 Medical Rec #: [id number]     Accession #:    [id number] Date of Birth: Jan 12, 1966     Patient Gender: M Patient Age:   55Y Exam Location:  [facility] Procedure:      VAS US  LOWER EXTREMITY VENOUS REFLUX Referring Phys: [id number] PENNE BRUCKNER CAIN --------------------------------------------------------------------------------  Indications: Pain, Swelling, and varicosities.  Comparison Study: 02/27/2021: No evidence of deep vein thrombosis bilaterally.                   Age indeterminate venous thrombosis in the bilateral greater                   saphenous veins. Performing Technologist: Duwaine Bastos  Examination Guidelines: A complete evaluation includes B-mode imaging, spectral Doppler, color Doppler, and power Doppler as needed of all accessible portions of each vessel. Bilateral testing is considered an integral part of a complete examination. Limited examinations for reoccurring indications may be performed as noted. The reflux portion of the exam is performed with the  patient in reverse Trendelenburg. Significant venous reflux is defined as >500 ms in the superficial venous system, and >1 second in the deep venous system.  Venous Reflux Times +----------------------+---------+------+-----------+------------+-------------+ RIGHT                 Reflux NoRefluxReflux TimeDiameter cmsComments                                      Yes                                       +----------------------+---------+------+-----------+------------+-------------+ CFV                   no                                                  +----------------------+---------+------+-----------+------------+-------------+ FV mid                no                                                  +----------------------+---------+------+-----------+------------+-------------+ Popliteal             no                                                  +----------------------+---------+------+-----------+------------+-------------+ GSV at [facility]  yes    >500 ms      0.67                  +----------------------+---------+------+-----------+------------+-------------+ GSV prox thigh                  yes    >500 ms      0.68                  +----------------------+---------+------+-----------+------------+-------------+ GSV mid thigh                   yes    >500 ms      0.45                  +----------------------+---------+------+-----------+------------+-------------+ GSV dist thigh                  yes    >500 ms      0.39                  +----------------------+---------+------+-----------+------------+-------------+ GSV at knee                     yes    >500 ms      0.41    out of fascia +----------------------+---------+------+-----------+------------+-------------+ GSV prox calf                   yes    >500 ms      0.48    out of fascia  +----------------------+---------+------+-----------+------------+-------------+ SSV Pop Fossa         no                            0.49                  +----------------------+---------+------+-----------+------------+-------------+ SSV prox calf                   yes    >500 ms      0.39                  +----------------------+---------+------+-----------+------------+-------------+ SSV mid calf          no                            0.25                  +----------------------+---------+------+-----------+------------+-------------+ Distal calf perforator          yes    >500 ms                            +----------------------+---------+------+-----------+------------+-------------+  +-------------------+---------+------+-----------+------------+--------+ LEFT               Reflux NoRefluxReflux TimeDiameter cmsComments                              Yes                                  +-------------------+---------+------+-----------+------------+--------+ CFV                          yes   >1 second                      +-------------------+---------+------+-----------+------------+--------+  FV mid             no                                             +-------------------+---------+------+-----------+------------+--------+ Popliteal          no                                             +-------------------+---------+------+-----------+------------+--------+ GSV at SFJ                   yes    >500 ms      0.96             +-------------------+---------+------+-----------+------------+--------+ GSV prox thigh               yes    >500 ms      0.63             +-------------------+---------+------+-----------+------------+--------+ GSV mid thigh                yes    >500 ms      0.46             +-------------------+---------+------+-----------+------------+--------+ GSV dist thigh               yes    >500 ms       0.47             +-------------------+---------+------+-----------+------------+--------+ GSV at knee                  yes    >500 ms      0.55             +-------------------+---------+------+-----------+------------+--------+ GSV prox calf                yes    >500 ms      0.51             +-------------------+---------+------+-----------+------------+--------+ SSV Pop Fossa      no                            0.56             +-------------------+---------+------+-----------+------------+--------+ SSV prox calf      no                            0.41             +-------------------+---------+------+-----------+------------+--------+ SSV mid calf       no                            0.33             +-------------------+---------+------+-----------+------------+--------+ Mid calf perforator          yes    >500 ms                       +-------------------+---------+------+-----------+------------+--------+   Summary: Bilateral: - No evidence of deep vein thrombosis seen in the lower extremities, bilaterally, from the common femoral through the popliteal veins.  Right: - Color duplex  evaluation of the right lower extremity shows there is thrombus in the distal greater saphenous vein. - Venous reflux is noted in the right sapheno-femoral junction. - Venous reflux is noted in the right greater saphenous vein in the thigh. - Venous reflux is noted in the right greater saphenous vein in the calf. - Venous reflux is noted in the right short saphenous vein. - Venous reflux is noted in the right calf perforator vein.  Left: - Color duplex evaluation of the left lower extremity shows there is thrombus in the distal greater saphenous vein and proximal greater saphenous vein. - Venous reflux is noted in the left common femoral vein. - Venous reflux is noted in the left sapheno-femoral junction. - Venous reflux is noted in the left greater saphenous vein in the thigh. - Venous  reflux is noted in the left greater saphenous vein in the calf. - Venous reflux is noted in the left calf perforator vein.  *See table(s) above for measurements and observations. Electronically signed by Penne Colorado MD on 03/30/2021 at 5:04:43 PM.    Final     Assessment & Plan:  Need for immunization against influenza -     Flu vaccine trivalent PF, 6mos and older(Flulaval,Afluria,Fluarix,Fluzone)  Acute non-recurrent maxillary sinusitis -     Amoxicillin -Pot Clavulanate; Take 1 tablet by mouth 2 (two) times daily for 10 days.  Dispense: 20 tablet; Refill: 0  Essential hypertension, benign- His BP is well controlled.     Follow-up: Return in about 2 months (around 09/26/2024).  Debby Molt, MD

## 2024-07-27 NOTE — Patient Instructions (Signed)

## 2024-07-31 ENCOUNTER — Encounter: Payer: Self-pay | Admitting: Internal Medicine

## 2024-08-02 ENCOUNTER — Other Ambulatory Visit: Payer: Self-pay

## 2024-08-02 MED ORDER — OZEMPIC (1 MG/DOSE) 4 MG/3ML ~~LOC~~ SOPN: 1.0000 mg | PEN_INJECTOR | SUBCUTANEOUS | 0 refills | Status: DC

## 2024-08-07 ENCOUNTER — Other Ambulatory Visit: Payer: Self-pay | Admitting: Internal Medicine

## 2024-08-07 DIAGNOSIS — E785 Hyperlipidemia, unspecified: Secondary | ICD-10-CM

## 2024-08-12 LAB — HM DIABETES EYE EXAM

## 2024-08-19 DIAGNOSIS — G250 Essential tremor: Secondary | ICD-10-CM | POA: Diagnosis not present

## 2024-08-24 ENCOUNTER — Other Ambulatory Visit: Payer: Self-pay | Admitting: Internal Medicine

## 2024-08-24 DIAGNOSIS — I10 Essential (primary) hypertension: Secondary | ICD-10-CM

## 2024-08-24 DIAGNOSIS — F411 Generalized anxiety disorder: Secondary | ICD-10-CM

## 2024-08-26 ENCOUNTER — Other Ambulatory Visit: Payer: Self-pay | Admitting: Internal Medicine

## 2024-08-26 DIAGNOSIS — I739 Peripheral vascular disease, unspecified: Secondary | ICD-10-CM

## 2024-08-31 DIAGNOSIS — G250 Essential tremor: Secondary | ICD-10-CM | POA: Diagnosis not present

## 2024-09-16 ENCOUNTER — Encounter: Payer: Self-pay | Admitting: Internal Medicine

## 2024-09-18 ENCOUNTER — Other Ambulatory Visit: Payer: Self-pay | Admitting: Internal Medicine

## 2024-09-18 DIAGNOSIS — E119 Type 2 diabetes mellitus without complications: Secondary | ICD-10-CM

## 2024-09-23 ENCOUNTER — Other Ambulatory Visit: Payer: Self-pay | Admitting: Internal Medicine

## 2024-09-23 ENCOUNTER — Other Ambulatory Visit (INDEPENDENT_AMBULATORY_CARE_PROVIDER_SITE_OTHER)

## 2024-09-23 DIAGNOSIS — E119 Type 2 diabetes mellitus without complications: Secondary | ICD-10-CM

## 2024-09-23 DIAGNOSIS — Z794 Long term (current) use of insulin: Secondary | ICD-10-CM

## 2024-09-23 LAB — HEMOGLOBIN A1C: Hgb A1c MFr Bld: 6.2 % (ref 4.6–6.5)

## 2024-09-27 ENCOUNTER — Telehealth: Payer: Self-pay

## 2024-09-27 ENCOUNTER — Other Ambulatory Visit (HOSPITAL_COMMUNITY): Payer: Self-pay

## 2024-09-27 DIAGNOSIS — G250 Essential tremor: Secondary | ICD-10-CM | POA: Diagnosis not present

## 2024-09-27 NOTE — Telephone Encounter (Signed)
 Pharmacy Patient Advocate Encounter   Received notification from Patient Pharmacy that prior authorization for Freestyle Libre 3 Plus Sensor is required/requested.   Insurance verification completed.   The patient is insured through [employer].   Per test claim: PA required; PA started via CoverMyMeds. KEY [id number] . Waiting for clinical questions to populate.

## 2024-09-28 NOTE — Telephone Encounter (Signed)
 Patient states that his insurance has paid for this medication before but isn't paying for it now. Is there anything we can do?

## 2024-09-28 NOTE — Telephone Encounter (Signed)
 Clinical questions answered and PA submitted.

## 2024-09-28 NOTE — Telephone Encounter (Signed)
 Pharmacy Patient Advocate Encounter  Received notification from [employer] that Prior Authorization for [facility] 3 plus sensor has been DENIED.  See denial reason below. No denial letter attached in CMM. Will attach denial letter to Media tab once received.   PA #/Case ID/Reference #: [id number]    May approve for insulin -dependent diabetes.

## 2024-09-29 ENCOUNTER — Other Ambulatory Visit (HOSPITAL_COMMUNITY): Payer: Self-pay

## 2024-10-04 NOTE — Telephone Encounter (Signed)
 Patient would like for pharmacist to give him a call. He spoke with her today about an appeal and had some additional information for her.

## 2024-10-04 NOTE — Progress Notes (Signed)
 Called the patient to discuss. Per chart review, it appears he previously had an Rx for Tresiba  in 2023 and 2024 and this is why it was previously approved. He has not had to fill insulin  since 11/2022 therefore now it is being denied. Patient has been using Freestyle Libre 3 since 2023 or longer. He does report he has lows <54. Pt connected his LibreView data with me and I was able to confirm more than 2 instances of severe hypoglycemia in the last 2 weeks. Will upload this data and ask for an appeal to be submitted.  Darrelyn Drum, PharmD, BCPS, CPP Clinical Pharmacist Practitioner [facility] [phone]

## 2024-10-05 ENCOUNTER — Other Ambulatory Visit (HOSPITAL_COMMUNITY): Payer: Self-pay

## 2024-10-07 ENCOUNTER — Other Ambulatory Visit (HOSPITAL_COMMUNITY): Payer: Self-pay

## 2024-10-08 ENCOUNTER — Other Ambulatory Visit (HOSPITAL_COMMUNITY): Payer: Self-pay

## 2024-10-08 NOTE — Telephone Encounter (Signed)
 Pharmacy Patient Advocate Encounter  Received notification from [employer] that Prior Authorization for [facility] 3 plus sensors has been APPROVED from 09/27/24 to 03/26/25   PA #/Case ID/Reference #: [id number]  Approval letter indexed to media tab

## 2024-10-08 NOTE — Telephone Encounter (Signed)
 Patient has been made aware.

## 2024-10-19 ENCOUNTER — Other Ambulatory Visit: Payer: Self-pay | Admitting: Internal Medicine

## 2024-10-19 DIAGNOSIS — G250 Essential tremor: Secondary | ICD-10-CM

## 2024-10-20 ENCOUNTER — Other Ambulatory Visit: Payer: Self-pay | Admitting: Internal Medicine

## 2024-10-20 DIAGNOSIS — G250 Essential tremor: Secondary | ICD-10-CM

## 2024-10-20 DIAGNOSIS — E039 Hypothyroidism, unspecified: Secondary | ICD-10-CM

## 2024-10-25 ENCOUNTER — Ambulatory Visit: Admitting: Internal Medicine

## 2024-10-25 DIAGNOSIS — G250 Essential tremor: Secondary | ICD-10-CM | POA: Diagnosis not present

## 2024-10-26 ENCOUNTER — Encounter: Payer: Self-pay | Admitting: Internal Medicine

## 2024-10-26 ENCOUNTER — Ambulatory Visit: Admitting: Internal Medicine

## 2024-10-26 VITALS — BP 122/78 | HR 90 | Temp 98.4°F | Resp 16 | Ht 72.0 in | Wt 291.8 lb

## 2024-10-26 DIAGNOSIS — I10 Essential (primary) hypertension: Secondary | ICD-10-CM | POA: Diagnosis not present

## 2024-10-26 DIAGNOSIS — E039 Hypothyroidism, unspecified: Secondary | ICD-10-CM | POA: Diagnosis not present

## 2024-10-26 DIAGNOSIS — E118 Type 2 diabetes mellitus with unspecified complications: Secondary | ICD-10-CM

## 2024-10-26 DIAGNOSIS — E785 Hyperlipidemia, unspecified: Secondary | ICD-10-CM | POA: Diagnosis not present

## 2024-10-26 DIAGNOSIS — Z23 Encounter for immunization: Secondary | ICD-10-CM

## 2024-10-26 LAB — BASIC METABOLIC PANEL WITH GFR
BUN: 10 mg/dL (ref 6–23)
CO2: 32 meq/L (ref 19–32)
Calcium: 10.2 mg/dL (ref 8.4–10.5)
Chloride: 100 meq/L (ref 96–112)
Creatinine, Ser: 0.86 mg/dL (ref 0.40–1.50)
GFR: 93.85 mL/min (ref >60.00)
Glucose, Bld: 116 mg/dL — ABNORMAL HIGH (ref 70–99)
Potassium: 3.9 meq/L (ref 3.5–5.1)
Sodium: 142 meq/L (ref 135–145)

## 2024-10-26 LAB — CBC WITH DIFFERENTIAL/PLATELET
Basophils Absolute: 0 K/uL (ref 0.0–0.1)
Basophils Relative: 0.7 % (ref 0.0–3.0)
Eosinophils Absolute: 0.1 K/uL (ref 0.0–0.7)
Eosinophils Relative: 1.7 % (ref 0.0–5.0)
HCT: 52.4 % — ABNORMAL HIGH (ref 39.0–52.0)
Hemoglobin: 17.7 g/dL — ABNORMAL HIGH (ref 13.0–17.0)
Lymphocytes Relative: 19.8 % (ref 12.0–46.0)
Lymphs Abs: 1.2 K/uL (ref 0.7–4.0)
MCHC: 33.8 g/dL (ref 30.0–36.0)
MCV: 88.4 fl (ref 78.0–100.0)
Monocytes Absolute: 0.6 K/uL (ref 0.1–1.0)
Monocytes Relative: 10.8 % (ref 3.0–12.0)
Neutro Abs: 3.9 K/uL (ref 1.4–7.7)
Neutrophils Relative %: 67 % (ref 43.0–77.0)
Platelets: 205 K/uL (ref 150.0–400.0)
RBC: 5.93 Mil/uL — ABNORMAL HIGH (ref 4.22–5.81)
RDW: 14.6 % (ref 11.5–15.5)
WBC: 5.9 K/uL (ref 4.0–10.5)

## 2024-10-26 LAB — TSH: TSH: 2.69 u[IU]/mL (ref 0.35–5.50)

## 2024-10-26 LAB — LIPID PANEL
Cholesterol: 89 mg/dL (ref 0–200)
HDL: 42.5 mg/dL (ref >39.00)
LDL Cholesterol: 36 mg/dL (ref 0–99)
NonHDL: 46.7
Total CHOL/HDL Ratio: 2
Triglycerides: 55 mg/dL (ref 0.0–149.0)
VLDL: 11 mg/dL (ref 0.0–40.0)

## 2024-10-26 MED ORDER — COVID-19 MRNA VAC-TRIS(PFIZER) 30 MCG/0.3ML IM SUSY: 0.3000 mL | PREFILLED_SYRINGE | Freq: Once | INTRAMUSCULAR | 0 refills | Status: AC

## 2024-10-26 MED ORDER — BOOSTRIX 5-2.5-18.5 LF-MCG/0.5 IM SUSP: 0.5000 mL | Freq: Once | INTRAMUSCULAR | 0 refills | Status: AC

## 2024-10-26 NOTE — Progress Notes (Unsigned)
 Subjective:  Patient ID: Zachary Murray, male    DOB: [birth year]  Age: 58 y.o. MRN: [id number]  CC: Diabetes, Hypothyroidism, and Hyperlipidemia   [name] presents for f/up ---  Discussed the use of AI scribe software for clinical note transcription with the patient, who gave verbal consent to proceed.  History of Present Illness Zachary Murray is a 58 year old male with essential tremor who presents for follow-up regarding potential DBS candidacy.  He experiences significant impairment in fine motor skills, particularly when performing tasks such as writing or chopping. He reports significant impairment in fine motor skills, particularly when performing tasks such as writing or chopping, and notes some balance issues but does not feel unstable on his feet.  He is currently taking primidone , which causes dizziness and brain fog, making it difficult to take a sufficient dose to alleviate his symptoms without affecting his ability to function. He attributes most of his dizziness to the medication rather than his condition.  He reports lingering fatigue but states that his bowel movements are regular. He is unsure if his current thyroid  medication dose is adequate for managing fatigue.  He has been managing his blood sugar levels by significantly reducing sugar and carbohydrate intake, resulting in an A1c level of 6.2%. He notes some digestive issues with Ozempic  but finds them tolerable.  No chest pain, shortness of breath, or coughing.     Outpatient Medications Prior to Visit  Medication Sig Dispense Refill   ALPRAZolam  (XANAX ) 0.5 MG tablet TAKE 1 TABLET(0.5 MG) BY MOUTH THREE TIMES DAILY AS NEEDED FOR ANXIETY 270 tablet 0   aspirin  EC 81 MG tablet Take 1 tablet (81 mg total) by mouth daily. Swallow whole.     Blood Glucose Monitoring Suppl (ONETOUCH VERIO FLEX SYSTEM) w/Device KIT Use as instructed to check blood sugar once daily. 1 kit 0   cilostazol  (PLETAL ) 100 MG  tablet TAKE 1 TABLET(100 MG) BY MOUTH TWICE DAILY 180 tablet 1   clomiPHENE  (CLOMID ) 50 MG tablet Take 1 tablet (50 mg total) by mouth every 3 (three) days. 16 tablet 1   Continuous Glucose Sensor (FREESTYLE LIBRE 3 SENSOR) MISC APPLY 1 SENSOR ON UPPER ARM EVERY 14 DAYS FOR CONTINUOUS GLUCOSE 2 each 5   dapagliflozin  propanediol (FARXIGA ) 10 MG TABS tablet TAKE 1 TABLET(10 MG) BY MOUTH DAILY 90 tablet 1   Lancets (ONETOUCH DELICA PLUS LANCET33G) MISC Use to check blood sugar once a day 100 each 1   levothyroxine  (SYNTHROID ) 75 MCG tablet TAKE 1 TABLET(75 MCG) BY MOUTH DAILY BEFORE BREAKFAST 90 tablet 1   meloxicam  (MOBIC ) 7.5 MG tablet Take 1 tablet (7.5 mg total) by mouth daily. 90 tablet 0   primidone  (MYSOLINE ) 50 MG tablet TAKE 1 TABLET(50 MG) BY MOUTH AT BEDTIME 90 tablet 1   propranolol  (INDERAL ) 40 MG tablet TAKE 1 TABLET(40 MG) BY MOUTH THREE TIMES DAILY 270 tablet 1   rosuvastatin  (CRESTOR ) 10 MG tablet TAKE 1 TABLET(10 MG) BY MOUTH DAILY 90 tablet 0   Semaglutide , 1 MG/DOSE, (OZEMPIC , 1 MG/DOSE,) 4 MG/3ML SOPN Inject 1 mg into the skin once a week. 9 mL 0   Testosterone  20.25 MG/ACT (1.62%) GEL PLACE 2 PUMPS ONTO SKIN DAILY 150 g 3   triamterene -hydrochlorothiazide (DYAZIDE) 37.5-25 MG capsule TAKE 1 CAPSULE BY MOUTH ONCE DAILY IN THE MORNING 90 capsule 0   No facility-administered medications prior to visit.    ROS Review of Systems  Objective:  BP 122/78 (  BP Location: Left Arm, Patient Position: Sitting, Cuff Size: Normal)   Pulse 90   Temp 98.4 F (36.9 C) (Oral)   Resp 16   Ht 6' (1.829 m)   Wt 291 lb 12.8 oz (132.4 kg)   SpO2 98%   BMI 39.58 kg/m   BP Readings from Last 3 Encounters:  10/26/24 122/78  07/27/24 136/70  07/05/24 118/74    Wt Readings from Last 3 Encounters:  10/26/24 291 lb 12.8 oz (132.4 kg)  07/27/24 (!) 309 lb 3.2 oz (140.3 kg)  07/12/24 (!) 310 lb (140.6 kg)    Physical Exam  Lab Results  Component Value Date   WBC 6.4 06/01/2024    HGB 17.3 (H) 06/01/2024   HCT 51.1 06/01/2024   PLT 216 06/01/2024   GLUCOSE 142 (H) 04/30/2024   CHOL 96 10/23/2023   TRIG 75.0 10/23/2023   HDL 33.00 (L) 10/23/2023   LDLCALC 48 10/23/2023   ALT 21 04/30/2024   AST 20 04/30/2024   NA 139 04/30/2024   K 3.5 04/30/2024   CL 98 04/30/2024   CREATININE 0.97 04/30/2024   BUN 13 04/30/2024   CO2 30 04/30/2024   TSH 1.92 04/30/2024   PSA 2.27 06/17/2024   INR 1.2 (H) 12/18/2021   HGBA1C 6.2 09/23/2024   MICROALBUR 2.9 (H) 04/30/2024    VAS US  LOWER EXTREMITY VENOUS REFLUX Result Date: 03/30/2021  Lower Venous Reflux Study Patient Name:  Zachary Murray  Date of Exam:   03/30/2021 Medical Rec #: [id number]     Accession #:    [id number] Date of Birth: 04/11/1966     Patient Gender: M Patient Age:   55Y Exam Location:  [facility] Procedure:      VAS US  LOWER EXTREMITY VENOUS REFLUX Referring Phys: [id number] PENNE CHRISTOPHER CAIN --------------------------------------------------------------------------------  Indications: Pain, Swelling, and varicosities.  Comparison Study: 02/27/2021: No evidence of deep vein thrombosis bilaterally.                   Age indeterminate venous thrombosis in the bilateral greater                   saphenous veins. Performing Technologist: Duwaine Bastos  Examination Guidelines: A complete evaluation includes B-mode imaging, spectral Doppler, color Doppler, and power Doppler as needed of all accessible portions of each vessel. Bilateral testing is considered an integral part of a complete examination. Limited examinations for reoccurring indications may be performed as noted. The reflux portion of the exam is performed with the patient in reverse Trendelenburg. Significant venous reflux is defined as >500 ms in the superficial venous system, and >1 second in the deep venous system.  Venous Reflux Times +----------------------+---------+------+-----------+------------+-------------+ RIGHT                  Reflux NoRefluxReflux TimeDiameter cmsComments                                      Yes                                       +----------------------+---------+------+-----------+------------+-------------+ CFV                   no                                                  +----------------------+---------+------+-----------+------------+-------------+  FV mid                no                                                  +----------------------+---------+------+-----------+------------+-------------+ Popliteal             no                                                  +----------------------+---------+------+-----------+------------+-------------+ GSV at SFJ                      yes    >500 ms      0.67                  +----------------------+---------+------+-----------+------------+-------------+ GSV prox thigh                  yes    >500 ms      0.68                  +----------------------+---------+------+-----------+------------+-------------+ GSV mid thigh                   yes    >500 ms      0.45                  +----------------------+---------+------+-----------+------------+-------------+ GSV dist thigh                  yes    >500 ms      0.39                  +----------------------+---------+------+-----------+------------+-------------+ GSV at knee                     yes    >500 ms      0.41    out of fascia +----------------------+---------+------+-----------+------------+-------------+ GSV prox calf                   yes    >500 ms      0.48    out of fascia +----------------------+---------+------+-----------+------------+-------------+ SSV Pop Fossa         no                            0.49                  +----------------------+---------+------+-----------+------------+-------------+ SSV prox calf                   yes    >500 ms      0.39                   +----------------------+---------+------+-----------+------------+-------------+ SSV mid calf          no                            0.25                  +----------------------+---------+------+-----------+------------+-------------+ Distal calf perforator  yes    >500 ms                            +----------------------+---------+------+-----------+------------+-------------+  +-------------------+---------+------+-----------+------------+--------+ LEFT               Reflux NoRefluxReflux TimeDiameter cmsComments                              Yes                                  +-------------------+---------+------+-----------+------------+--------+ CFV                          yes   >1 second                      +-------------------+---------+------+-----------+------------+--------+ FV mid             no                                             +-------------------+---------+------+-----------+------------+--------+ Popliteal          no                                             +-------------------+---------+------+-----------+------------+--------+ GSV at SFJ                   yes    >500 ms      0.96             +-------------------+---------+------+-----------+------------+--------+ GSV prox thigh               yes    >500 ms      0.63             +-------------------+---------+------+-----------+------------+--------+ GSV mid thigh                yes    >500 ms      0.46             +-------------------+---------+------+-----------+------------+--------+ GSV dist thigh               yes    >500 ms      0.47             +-------------------+---------+------+-----------+------------+--------+ GSV at knee                  yes    >500 ms      0.55             +-------------------+---------+------+-----------+------------+--------+ GSV prox calf                yes    >500 ms      0.51              +-------------------+---------+------+-----------+------------+--------+ SSV Pop Fossa      no                            0.56             +-------------------+---------+------+-----------+------------+--------+  SSV prox calf      no                            0.41             +-------------------+---------+------+-----------+------------+--------+ SSV mid calf       no                            0.33             +-------------------+---------+------+-----------+------------+--------+ Mid calf perforator          yes    >500 ms                       +-------------------+---------+------+-----------+------------+--------+   Summary: Bilateral: - No evidence of deep vein thrombosis seen in the lower extremities, bilaterally, from the common femoral through the popliteal veins.  Right: - Color duplex evaluation of the right lower extremity shows there is thrombus in the distal greater saphenous vein. - Venous reflux is noted in the right sapheno-femoral junction. - Venous reflux is noted in the right greater saphenous vein in the thigh. - Venous reflux is noted in the right greater saphenous vein in the calf. - Venous reflux is noted in the right short saphenous vein. - Venous reflux is noted in the right calf perforator vein.  Left: - Color duplex evaluation of the left lower extremity shows there is thrombus in the distal greater saphenous vein and proximal greater saphenous vein. - Venous reflux is noted in the left common femoral vein. - Venous reflux is noted in the left sapheno-femoral junction. - Venous reflux is noted in the left greater saphenous vein in the thigh. - Venous reflux is noted in the left greater saphenous vein in the calf. - Venous reflux is noted in the left calf perforator vein.  *See table(s) above for measurements and observations. Electronically signed by Penne Colorado MD on 03/30/2021 at 5:04:43 PM.    Final     Assessment & Plan:  Essential hypertension,  benign -     Basic metabolic panel with GFR; Future -     CBC with Differential/Platelet; Future  Acquired hypothyroidism -     TSH; Future  Hyperlipidemia with target LDL less than 130 -     Lipid panel; Future  Need for prophylactic vaccination with combined diphtheria-tetanus-pertussis (DTP) vaccine -     Boostrix ; Inject 0.5 mLs into the muscle once for 1 dose.  Dispense: 0.5 mL; Refill: 0  Type II diabetes mellitus with manifestations (HCC) -     COVID-19 mRNA Vac-TriS(Pfizer); Inject 0.3 mLs into the muscle once for 1 dose.  Dispense: 0.3 mL; Refill: 0     Follow-up: No follow-ups on file.  Debby Molt, MD

## 2024-10-26 NOTE — Patient Instructions (Signed)

## 2024-10-27 ENCOUNTER — Ambulatory Visit: Payer: Self-pay | Admitting: Internal Medicine

## 2024-11-01 ENCOUNTER — Other Ambulatory Visit: Payer: Self-pay | Admitting: Internal Medicine

## 2024-11-01 ENCOUNTER — Encounter: Payer: Self-pay | Admitting: Internal Medicine

## 2024-11-01 DIAGNOSIS — E118 Type 2 diabetes mellitus with unspecified complications: Secondary | ICD-10-CM

## 2024-11-01 MED ORDER — OZEMPIC (1 MG/DOSE) 4 MG/3ML ~~LOC~~ SOPN: 1.0000 mg | PEN_INJECTOR | SUBCUTANEOUS | 0 refills | Status: AC

## 2024-11-08 ENCOUNTER — Other Ambulatory Visit: Payer: Self-pay | Admitting: Internal Medicine

## 2024-11-08 DIAGNOSIS — E785 Hyperlipidemia, unspecified: Secondary | ICD-10-CM

## 2024-11-19 ENCOUNTER — Encounter: Payer: Self-pay | Admitting: Internal Medicine

## 2024-11-19 ENCOUNTER — Other Ambulatory Visit: Payer: Self-pay | Admitting: Internal Medicine

## 2024-11-19 ENCOUNTER — Ambulatory Visit
Admission: EM | Admit: 2024-11-19 | Discharge: 2024-11-19 | Disposition: A | Discharge status: Home / Self Care | Attending: Family Medicine | Admitting: Family Medicine

## 2024-11-19 ENCOUNTER — Ambulatory Visit: Payer: Self-pay

## 2024-11-19 ENCOUNTER — Encounter: Payer: Self-pay | Admitting: Emergency Medicine

## 2024-11-19 DIAGNOSIS — J069 Acute upper respiratory infection, unspecified: Secondary | ICD-10-CM

## 2024-11-19 DIAGNOSIS — F411 Generalized anxiety disorder: Secondary | ICD-10-CM

## 2024-11-19 DIAGNOSIS — I10 Essential (primary) hypertension: Secondary | ICD-10-CM

## 2024-11-19 DIAGNOSIS — R070 Pain in throat: Secondary | ICD-10-CM

## 2024-11-19 LAB — POCT RAPID STREP A (OFFICE): Rapid Strep A Screen: NEGATIVE

## 2024-11-19 LAB — POCT INFLUENZA A/B
Influenza A, POC: NEGATIVE
Influenza B, POC: NEGATIVE

## 2024-11-19 MED ORDER — BENZONATATE 100 MG PO CAPS: 100.0000 mg | ORAL_CAPSULE | Freq: Three times a day (TID) | ORAL | 0 refills | Status: AC | PRN

## 2024-11-19 NOTE — Telephone Encounter (Signed)
 FYI Only or Action Required?: FYI only for provider: UC.  Patient was last seen in primary care on 10/26/2024 by Joshua Debby CROME, MD.  Called Nurse Triage reporting Sinusitis.  Symptoms began yesterday.  Interventions attempted: Nothing.  Symptoms are: gradually worsening.  Triage Disposition: Home Care  Patient/caregiver understands and will follow disposition?: No, wishes to speak with PCP Copied from CRM #[id number]. Topic: Clinical - Red Word Triage >> Nov 19, 2024  9:33 AM Jayma L wrote: Red Word that prompted transfer to Nurse Triage:  started sore throat and now its moving to sinus and chest with chest pain and some coughing Reason for Disposition  [1] Sinus congestion as part of a cold AND [2] present < 10 days  Answer Assessment - Initial Assessment Questions Starting 2 days ago- pt with scratchy throat that progressed to sore throat. Congestion developing in chest and sinuses. Reports Chest tightness with occasional cough that is productive - white, Wheezing - has inhaler and will use now  Denies CP, SOB, Fever, or Dizziness. Denies ear pain, sinus pain, or headaches. Negative home COVID test   Patient with history of progressing to pneumonia or sinusitis and has surgery on 1/21 that he does not want to miss. Advised sooner OV 1/5. Patient will go to UC to rule out flu.    1. LOCATION: Where does it hurt?      Nasal congestion/sinus congestion  2. ONSET: When did the sinus pain start?  (e.g., hours, days)      2 days ago  3. SEVERITY: How bad is the pain?   (Scale 0-10; or none, mild, moderate or severe)     Denies sinus pain  4. RECURRENT SYMPTOM: Have you ever had sinus problems before? If Yes, ask: When was the last time? and What happened that time?      This time of yeaer  5. NASAL CONGESTION: Is the nose blocked? If Yes, ask: Can you open it or must you breathe through your mouth?     Bilateral  6. NASAL DISCHARGE: Do you have discharge from your  nose? If so ask, What color?     White  7. FEVER: Do you have a fever? If Yes, ask: What is it, how was it measured, and when did it start?      Denies  8. OTHER SYMPTOMS: Do you have any other symptoms? (e.g., sore throat, cough, earache, difficulty breathing)     Denies  Protocols used: Sinus Pain or Congestion-A-AH

## 2024-11-19 NOTE — ED Provider Notes (Signed)
 " [facility]    CSN: [id number] Arrival date & time: 11/19/24  1026      History   Chief Complaint Chief Complaint  Patient presents with   Sore Throat   Nasal Congestion    [name] is a 59 y.o. male.    Sore Throat  Here for cough and congestion and sore throat.  He has also had some headache and feels a little tight in his chest.  No fever or chills.  He has not had any nausea or vomiting or diarrhea.  No myalgia.  He is allergic to Mounjaro  and lisinopril  and Wellbutrin  and metformin .  He has diabetes and his sugars been well-controlled.  He has a surgery coming up on January 21 is concerned about having a prolonged illness that might prevent him having that.    Past Medical History:  Diagnosis Date   Degenerative arthritis of left knee 11/21/2021   Depression ?   GAD (generalized anxiety disorder) 10/22/2017   Hyperlipidemia associated with type 2 diabetes mellitus (HCC) 11/19/2013   The 10-year ASCVD risk score Verdon DC Jr., et al., 2013) is: 5.8%    Values used to calculate the score:      Age: 75 years      Sex: Male      Is Non-Hispanic African American: No      Diabetic: Yes      Tobacco smoker: No      Systolic Blood Pressure: 118 mmHg      Is BP treated: Yes      HDL Cholesterol: 65.7 mg/dL      Total Cholesterol: 158 mg/dL   Hypertension    Hypogonadism male 02/11/2018   Hypothyroidism 12/08/2014   Insulin -requiring or dependent type II diabetes mellitus (HCC) 06/05/2022   Irritable bowel syndrome with diarrhea 12/19/2021   Obesity    OSA on CPAP    Peripheral vascular disease 05/06/2019   Saphenofemoral venous reflux; 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   Severe obesity (BMI >= 40) (HCC) 09/15/2012   Sleep apnea 2005   CPAP in use    Patient Active Problem List   Diagnosis Date Noted   Hyperlipidemia with target LDL less than 130 10/26/2024   Need for immunization against influenza 07/27/2024   Benign prostatic hyperplasia with weak urinary  stream 06/17/2024   Prurigo nodularis 12/17/2023   Erythrocytosis 10/24/2023   Insulin -requiring or dependent type II diabetes mellitus (HCC) 06/05/2022   Irritable bowel syndrome with diarrhea 12/19/2021   Degenerative arthritis of left knee 11/21/2021   Saphenofemoral venous reflux; 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   Need for prophylactic vaccination with combined diphtheria-tetanus-pertussis (DTP) vaccine 08/30/2020   Peripheral vascular disease 05/06/2019   Tremor, essential 07/27/2018   Vitamin D  deficiency 02/11/2018   Hypogonadism male 02/11/2018   GAD (generalized anxiety disorder) 10/22/2017   Meralgia paresthetica, left 10/02/2017   Routine general medical examination at a health care facility 03/26/2017   Severe episode of recurrent major depressive disorder, without psychotic features (HCC) 03/26/2017   Lumbar radiculopathy 07/02/2016   Type II diabetes mellitus with manifestations (HCC) 10/26/2015   Hypothyroidism 12/08/2014   Hyperlipidemia associated with type 2 diabetes mellitus (HCC) 11/19/2013   OSA on CPAP 11/19/2013   Severe obesity (BMI >= 40) (HCC) 09/15/2012   Essential hypertension, benign 09/15/2012    Past Surgical History:  Procedure Laterality Date   BIOPSY  08/31/2018   Procedure: BIOPSY;  Surgeon: Shila Gustav GAILS, MD;  Location: [facility];  Service: Endoscopy;;  COLONOSCOPY WITH PROPOFOL  N/A 11/12/2016   Procedure: COLONOSCOPY WITH PROPOFOL ;  Surgeon: Gustav LULLA Mcgee, MD;  Location: [facility];  Service: Endoscopy;  Laterality: N/A;   COLONOSCOPY WITH PROPOFOL  N/A 08/31/2018   Procedure: COLONOSCOPY WITH PROPOFOL ;  Surgeon: Mcgee Gustav LULLA, MD;  Location: [facility];  Service: Endoscopy;  Laterality: N/A;   POLYPECTOMY  08/31/2018   Procedure: POLYPECTOMY;  Surgeon: Mcgee Gustav LULLA, MD;  Location: [facility];  Service: Endoscopy;;   WISDOM TOOTH EXTRACTION         Home Medications    Prior to Admission medications  Medication Sig  Start Date End Date Taking? Authorizing Provider  benzonatate (TESSALON) 100 MG capsule Take 1 capsule (100 mg total) by mouth 3 (three) times daily as needed for cough. 11/19/24  Yes Vonna Sharlet POUR, MD  ALPRAZolam  (XANAX ) 0.5 MG tablet TAKE 1 TABLET(0.5 MG) BY MOUTH THREE TIMES DAILY AS NEEDED FOR ANXIETY 08/25/24   Joshua Debby CROME, MD  aspirin  EC 81 MG tablet Take 1 tablet (81 mg total) by mouth daily. Swallow whole. 05/30/23   Anner Alm ORN, MD  Blood Glucose Monitoring Suppl (ONETOUCH VERIO FLEX SYSTEM) w/Device KIT Use as instructed to check blood sugar once daily. 04/01/19   Von [place], MD  cilostazol  (PLETAL ) 100 MG tablet TAKE 1 TABLET(100 MG) BY MOUTH TWICE DAILY 08/27/24   Joshua Debby CROME, MD  clomiPHENE  (CLOMID ) 50 MG tablet Take 1 tablet (50 mg total) by mouth every 3 (three) days. 11/29/23   Joshua Debby CROME, MD  Continuous Glucose Sensor (FREESTYLE LIBRE 3 SENSOR) MISC APPLY 1 SENSOR ON UPPER ARM EVERY 14 DAYS FOR CONTINUOUS GLUCOSE 09/27/24   Joshua Debby CROME, MD  dapagliflozin  propanediol (FARXIGA ) 10 MG TABS tablet TAKE 1 TABLET(10 MG) BY MOUTH DAILY 08/27/24   Joshua Debby CROME, MD  Lancets [facility] DELICA PLUS [place]) MISC Use to check blood sugar once a day 05/07/22   Von [place], MD  levothyroxine  (SYNTHROID ) 75 MCG tablet TAKE 1 TABLET(75 MCG) BY MOUTH DAILY BEFORE BREAKFAST 10/20/24   Joshua Debby CROME, MD  meloxicam  (MOBIC ) 7.5 MG tablet Take 1 tablet (7.5 mg total) by mouth daily. 05/20/23   Joshua Debby CROME, MD  primidone  (MYSOLINE ) 50 MG tablet TAKE 1 TABLET(50 MG) BY MOUTH AT BEDTIME 10/20/24   Joshua Debby CROME, MD  propranolol  (INDERAL ) 40 MG tablet TAKE 1 TABLET(40 MG) BY MOUTH THREE TIMES DAILY 10/20/24   Joshua Debby CROME, MD  rosuvastatin  (CRESTOR ) 10 MG tablet TAKE 1 TABLET(10 MG) BY MOUTH DAILY 11/08/24   Joshua Debby CROME, MD  Semaglutide , 1 MG/DOSE, (OZEMPIC , 1 MG/DOSE,) 4 MG/3ML SOPN Inject 1 mg into the skin once a week. 11/01/24   Joshua Debby CROME, MD  Testosterone  20.25  MG/ACT (1.62%) GEL PLACE 2 PUMPS ONTO SKIN DAILY 07/01/24   Joshua Debby CROME, MD  triamterene -hydrochlorothiazide (DYAZIDE) 37.5-25 MG capsule TAKE 1 CAPSULE BY MOUTH ONCE DAILY IN THE MORNING 08/25/24   Joshua Debby CROME, MD    Family History Family History  Problem Relation Age of Onset   Colon cancer Father    Hypertension Father    Cancer Father    Heart disease Father    Vision loss Father    Thyroid  cancer Mother    Colon polyps Mother    Thyroid  disease Mother    Cancer Mother    Hearing loss Mother    Thyroid  disease Sister    Thyroid  disease Maternal Grandmother    Cancer Maternal Grandmother  Vision loss Maternal Grandmother    Varicose Veins Maternal Grandmother    Anxiety disorder Maternal Grandfather    Depression Maternal Grandfather    Stroke Maternal Grandfather    Stroke Paternal Grandfather    Vision loss Paternal Grandfather    Depression Paternal Grandmother    Heart disease Paternal Grandmother    Hypertension Paternal Grandmother    Varicose Veins Paternal Grandmother    Heart disease Paternal Uncle    Hypertension Paternal Uncle    Cancer Paternal Uncle    Early death Paternal Uncle    Alcohol abuse Neg Hx    COPD Neg Hx    Diabetes Neg Hx    Hyperlipidemia Neg Hx    Kidney disease Neg Hx    Esophageal cancer Neg Hx     Social History Social History[1]   Allergies   Metformin  and related, Wellbutrin  [bupropion ], Mounjaro  [tirzepatide ], and Lisinopril    Review of Systems Review of Systems   Physical Exam Triage Vital Signs ED Triage Vitals  Encounter Vitals Group     BP 11/19/24 1130 114/75     Girls Systolic BP Percentile --      Girls Diastolic BP Percentile --      Boys Systolic BP Percentile --      Boys Diastolic BP Percentile --      Pulse Rate 11/19/24 1130 80     Resp 11/19/24 1130 18     Temp 11/19/24 1130 98.4 F (36.9 C)     Temp Source 11/19/24 1130 Oral     SpO2 11/19/24 1130 96 %     Weight 11/19/24 1129 291 lb  14.2 oz (132.4 kg)     Height --      Head Circumference --      Peak Flow --      Pain Score 11/19/24 1128 3     Pain Loc --      Pain Education --      Exclude from Growth Chart --    No data found.  Updated Vital Signs BP 114/75 (BP Location: Left Arm)   Pulse 80   Temp 98.4 F (36.9 C) (Oral)   Resp 18   Wt 132.4 kg   SpO2 96%   BMI 39.59 kg/m   Visual Acuity Right Eye Distance:   Left Eye Distance:   Bilateral Distance:    Right Eye Near:   Left Eye Near:    Bilateral Near:     Physical Exam Vitals reviewed.  Constitutional:      General: He is not in acute distress.    Appearance: He is not ill-appearing, toxic-appearing or diaphoretic.  HENT:     Right Ear: Tympanic membrane and ear canal normal.     Left Ear: Tympanic membrane and ear canal normal.     Nose: Congestion present.     Mouth/Throat:     Mouth: Mucous membranes are moist.     Comments: There is some erythema of the tonsillar pillars.  There is clear exudate draining in the posterior oropharynx.  No peritonsillar swelling.  Uvula is in the midline Eyes:     Extraocular Movements: Extraocular movements intact.     Conjunctiva/sclera: Conjunctivae normal.     Pupils: Pupils are equal, round, and reactive to light.  Cardiovascular:     Rate and Rhythm: Normal rate and regular rhythm.     Heart sounds: No murmur heard. Pulmonary:     Effort: No respiratory distress.  Breath sounds: No stridor. No wheezing, rhonchi or rales.  Musculoskeletal:     Cervical back: Neck supple.  Lymphadenopathy:     Cervical: No cervical adenopathy.  Skin:    Capillary Refill: Capillary refill takes less than 2 seconds.     Coloration: Skin is not jaundiced or pale.  Neurological:     General: No focal deficit present.     Mental Status: He is alert and oriented to person, place, and time.  Psychiatric:        Behavior: Behavior normal.      UC Treatments / Results  Labs (all labs ordered are listed,  but only abnormal results are displayed) Labs Reviewed  POCT INFLUENZA A/B - Normal  POCT RAPID STREP A (OFFICE) - Normal    EKG   Radiology No results found.  Procedures Procedures (including critical care time)  Medications Ordered in UC Medications - No data to display  Initial Impression / Assessment and Plan / UC Course  I have reviewed the triage vital signs and the nursing notes.  Pertinent labs & imaging results that were available during my care of the patient were reviewed by me and considered in my medical decision making (see chart for details).     Rapid strep is negative.  Flu test is negative here.  He did home COVID test earlier today and it was negative.  Tessalon Perles were sent in for the cough.  And also recommended some Mucinex to take as needed.   Final Clinical Impressions(s) / UC Diagnoses   Final diagnoses:  Acute upper respiratory infection  Throat pain     Discharge Instructions      The flu test is negative  The strep test is also negative.  Take benzonatate 100 mg, 1 tab every 8 hours as needed for cough.  You can take Tylenol  as needed for pain.  Mucinex is also something that would be helpful for the congestion.  Make sure you are drinking plenty of fluids.     ED Prescriptions     Medication Sig Dispense Auth. Provider   benzonatate (TESSALON) 100 MG capsule Take 1 capsule (100 mg total) by mouth 3 (three) times daily as needed for cough. 21 capsule Kushi Kun K, MD      PDMP not reviewed this encounter.    [1]  Social History Tobacco Use   Smoking status: Never   Smokeless tobacco: Former    Types: Engineer, Drilling   Vaping status: Never Used  Substance Use Topics   Alcohol use: No    Alcohol/week: 0.0 standard drinks of alcohol    Comment: hx of alcohol abuse - quit 2017   Drug use: No     Vonna Sharlet POUR, MD 11/19/24 1320  "

## 2024-11-19 NOTE — Discharge Instructions (Addendum)
 The flu test is negative  The strep test is also negative.  Take benzonatate 100 mg, 1 tab every 8 hours as needed for cough.  You can take Tylenol  as needed for pain.  Mucinex is also something that would be helpful for the congestion.  Make sure you are drinking plenty of fluids.

## 2024-11-19 NOTE — ED Triage Notes (Signed)
 Pt presents c/o URI x 2 days. Pt states,  I had some flu sxs that started about 36 hours ago. I woke up with a sore throat. Today, it's moving into my lungs. It's also moved into my sinuses. I also have a cough. I have a surgery coming up and I need to get rid of these sxs. Pt denies emesis, body aches, and diarrhea.

## 2024-11-22 ENCOUNTER — Encounter: Payer: Self-pay | Admitting: Internal Medicine

## 2024-11-22 NOTE — Progress Notes (Signed)
 Opened In error

## 2024-11-23 ENCOUNTER — Telehealth: Payer: Self-pay

## 2024-11-23 ENCOUNTER — Other Ambulatory Visit: Payer: Self-pay | Admitting: Internal Medicine

## 2024-11-23 NOTE — Telephone Encounter (Signed)
 Copied from CRM [id number]. Topic: General - Other >> Nov 23, 2024 10:47 AM Sophia H wrote: Reason for CRM: Called to check in on clearance form, states it was faxed yesterday afternoon around 1:30-2p. Do not see in chart, please be on look out, will be re faxing. Need it back ASAP - patients surgery is on the 21st and medication will need to be held 7 days prior. Please reach out if you do not receive.   Almeda with [facility]/ Dr. Laquita office - [phone]   Confirmed Fax # [phone].

## 2024-11-24 ENCOUNTER — Telehealth: Payer: Self-pay

## 2024-11-24 ENCOUNTER — Other Ambulatory Visit: Payer: Self-pay

## 2024-11-24 DIAGNOSIS — I10 Essential (primary) hypertension: Secondary | ICD-10-CM

## 2024-11-24 DIAGNOSIS — F411 Generalized anxiety disorder: Secondary | ICD-10-CM

## 2024-11-24 MED ORDER — ALPRAZOLAM 0.5 MG PO TABS: ORAL_TABLET | ORAL | 0 refills | Status: AC

## 2024-11-24 MED ORDER — TRIAMTERENE-HCTZ 37.5-25 MG PO CAPS: 1.0000 | ORAL_CAPSULE | Freq: Every morning | ORAL | 1 refills | Status: AC

## 2024-11-24 NOTE — Telephone Encounter (Unsigned)
 Copied from CRM [id number]. Topic: General - Other >> Nov 23, 2024 10:47 AM Sophia H wrote: Reason for CRM: Called to check in on clearance form, states it was faxed yesterday afternoon around 1:30-2p. Do not see in chart, please be on look out, will be re faxing. Need it back ASAP - patients surgery is on the 21st and medication will need to be held 7 days prior. Please reach out if you do not receive.   Almeda with [facility]/ Dr. Laquita office - [phone]   Confirmed Fax # [phone]. >> Nov 24, 2024 10:27 AM Robinson DEL wrote: Almeda with [facility] following up to make sure office received medical clearance for patient faxed on 1/5, states forms were re-faxed today. Please reach out to verify  Almeda [phone]

## 2024-11-24 NOTE — Telephone Encounter (Signed)
 Copied from CRM [id number]. Topic: Clinical - Medical Advice >> Nov 24, 2024  2:24 PM Charolett L wrote: Reason for CRM: Almeda from [facility] surgery stated that she sent the form over on the 5th, 6th and 7th for cardiac clearance. Surgery scheduled for the 21st and documents back by the 12th or 13th latest due to administering medication a week early

## 2024-11-24 NOTE — Telephone Encounter (Unsigned)
 Copied from CRM [id number]. Topic: General - Other >> Nov 24, 2024  2:53 PM Robinson H wrote: Reason for CRM: Patient returning call to [facility], agent advised patient of message. Patient acknowledged

## 2024-11-24 NOTE — Telephone Encounter (Signed)
 I have spoken with Zachary Murray and advised her that we have received the surgical clearance and it has been given to Dr.Jones. Once Dr. Joshua complete the clearance I will fax it back. She gave a verbal understanding.

## 2024-11-24 NOTE — Telephone Encounter (Signed)
 Patient has been mae aware that we received his clearance form and It has been given to Dr. Joshua.

## 2024-11-24 NOTE — Telephone Encounter (Signed)
 Has been received and given to Dr. Joshua

## 2024-11-25 NOTE — Telephone Encounter (Signed)
 For has been completed and faxed back to [facility].

## 2024-12-09 ENCOUNTER — Other Ambulatory Visit (HOSPITAL_COMMUNITY): Payer: Self-pay

## 2024-12-15 ENCOUNTER — Ambulatory Visit: Admitting: Primary Care

## 2025-01-11 ENCOUNTER — Ambulatory Visit: Admitting: Primary Care

## 2025-01-12 ENCOUNTER — Ambulatory Visit: Admitting: Primary Care

## 2025-01-19 ENCOUNTER — Ambulatory Visit: Admitting: Primary Care

## 2025-02-28 ENCOUNTER — Ambulatory Visit: Admitting: Internal Medicine
